# Patient Record
Sex: Male | Born: 1935 | Race: White | Hispanic: No | Marital: Married | Smoking: Former smoker
Health system: Southern US, Community
[De-identification: ages and names within clinical notes are randomized; demographics above are authoritative.]

## PROBLEM LIST (undated history)

## (undated) DIAGNOSIS — N401 Enlarged prostate with lower urinary tract symptoms: Secondary | ICD-10-CM

## (undated) DIAGNOSIS — I2699 Other pulmonary embolism without acute cor pulmonale: Secondary | ICD-10-CM

## (undated) DIAGNOSIS — K5900 Constipation, unspecified: Secondary | ICD-10-CM

## (undated) DIAGNOSIS — Z7901 Long term (current) use of anticoagulants: Secondary | ICD-10-CM

## (undated) DIAGNOSIS — Z8719 Personal history of other diseases of the digestive system: Secondary | ICD-10-CM

## (undated) DIAGNOSIS — D75829 Heparin-induced thrombocytopenia, unspecified: Secondary | ICD-10-CM

## (undated) DIAGNOSIS — K5730 Diverticulosis of large intestine without perforation or abscess without bleeding: Secondary | ICD-10-CM

## (undated) DIAGNOSIS — D709 Neutropenia, unspecified: Secondary | ICD-10-CM

## (undated) DIAGNOSIS — E785 Hyperlipidemia, unspecified: Secondary | ICD-10-CM

## (undated) DIAGNOSIS — A048 Other specified bacterial intestinal infections: Secondary | ICD-10-CM

## (undated) DIAGNOSIS — I4891 Unspecified atrial fibrillation: Secondary | ICD-10-CM

## (undated) DIAGNOSIS — R740 Nonspecific elevation of levels of transaminase and lactic acid dehydrogenase [LDH]: Secondary | ICD-10-CM

## (undated) DIAGNOSIS — I10 Essential (primary) hypertension: Secondary | ICD-10-CM

## (undated) DIAGNOSIS — J301 Allergic rhinitis due to pollen: Secondary | ICD-10-CM

## (undated) DIAGNOSIS — I00 Rheumatic fever without heart involvement: Secondary | ICD-10-CM

## (undated) DIAGNOSIS — I209 Angina pectoris, unspecified: Secondary | ICD-10-CM

## (undated) DIAGNOSIS — N138 Other obstructive and reflux uropathy: Secondary | ICD-10-CM

## (undated) DIAGNOSIS — C4401 Basal cell carcinoma of skin of lip: Secondary | ICD-10-CM

## (undated) DIAGNOSIS — J189 Pneumonia, unspecified organism: Secondary | ICD-10-CM

## (undated) DIAGNOSIS — D7582 Heparin induced thrombocytopenia (HIT): Secondary | ICD-10-CM

## (undated) DIAGNOSIS — D689 Coagulation defect, unspecified: Secondary | ICD-10-CM

## (undated) DIAGNOSIS — R17 Unspecified jaundice: Secondary | ICD-10-CM

## (undated) DIAGNOSIS — R609 Edema, unspecified: Secondary | ICD-10-CM

## (undated) DIAGNOSIS — R39198 Other difficulties with micturition: Secondary | ICD-10-CM

## (undated) DIAGNOSIS — K648 Other hemorrhoids: Secondary | ICD-10-CM

## (undated) DIAGNOSIS — M6208 Separation of muscle (nontraumatic), other site: Secondary | ICD-10-CM

## (undated) DIAGNOSIS — I219 Acute myocardial infarction, unspecified: Secondary | ICD-10-CM

## (undated) DIAGNOSIS — R7402 Elevation of levels of lactic acid dehydrogenase (LDH): Secondary | ICD-10-CM

## (undated) DIAGNOSIS — C4490 Unspecified malignant neoplasm of skin, unspecified: Secondary | ICD-10-CM

## (undated) DIAGNOSIS — F419 Anxiety disorder, unspecified: Secondary | ICD-10-CM

## (undated) DIAGNOSIS — G4730 Sleep apnea, unspecified: Secondary | ICD-10-CM

## (undated) DIAGNOSIS — R498 Other voice and resonance disorders: Secondary | ICD-10-CM

## (undated) DIAGNOSIS — I2510 Atherosclerotic heart disease of native coronary artery without angina pectoris: Secondary | ICD-10-CM

## (undated) DIAGNOSIS — N529 Male erectile dysfunction, unspecified: Secondary | ICD-10-CM

## (undated) DIAGNOSIS — R7401 Elevation of levels of liver transaminase levels: Secondary | ICD-10-CM

## (undated) DIAGNOSIS — R300 Dysuria: Secondary | ICD-10-CM

## (undated) HISTORY — DX: Constipation, unspecified: K59.00

## (undated) HISTORY — DX: Sleep apnea, unspecified: G47.30

## (undated) HISTORY — DX: Heparin induced thrombocytopenia (HIT): D75.82

## (undated) HISTORY — DX: Unspecified malignant neoplasm of skin, unspecified: C44.90

## (undated) HISTORY — DX: Other specified bacterial intestinal infections: A04.8

## (undated) HISTORY — DX: Separation of muscle (nontraumatic), other site: M62.08

## (undated) HISTORY — DX: Other difficulties with micturition: R39.198

## (undated) HISTORY — DX: Other obstructive and reflux uropathy: N13.8

## (undated) HISTORY — PX: HYDROCELE EXCISION: SHX482

## (undated) HISTORY — DX: Nonspecific elevation of levels of transaminase and lactic acid dehydrogenase (ldh): R74.0

## (undated) HISTORY — DX: Benign prostatic hyperplasia with lower urinary tract symptoms: N40.1

## (undated) HISTORY — DX: Acute myocardial infarction, unspecified: I21.9

## (undated) HISTORY — DX: Anxiety disorder, unspecified: F41.9

## (undated) HISTORY — DX: Rheumatic fever without heart involvement: I00

## (undated) HISTORY — DX: Diverticulosis of large intestine without perforation or abscess without bleeding: K57.30

## (undated) HISTORY — DX: Dysuria: R30.0

## (undated) HISTORY — DX: Male erectile dysfunction, unspecified: N52.9

## (undated) HISTORY — DX: Allergic rhinitis due to pollen: J30.1

## (undated) HISTORY — DX: Hyperlipidemia, unspecified: E78.5

## (undated) HISTORY — DX: Pneumonia, unspecified organism: J18.9

## (undated) HISTORY — DX: Personal history of other diseases of the digestive system: Z87.19

## (undated) HISTORY — DX: Coagulation defect, unspecified: D68.9

## (undated) HISTORY — DX: Other pulmonary embolism without acute cor pulmonale: I26.99

## (undated) HISTORY — DX: Unspecified atrial fibrillation: I48.91

## (undated) HISTORY — DX: Elevation of levels of lactic acid dehydrogenase (LDH): R74.02

## (undated) HISTORY — DX: Essential (primary) hypertension: I10

## (undated) HISTORY — DX: Other voice and resonance disorders: R49.8

## (undated) HISTORY — DX: Long term (current) use of anticoagulants: Z79.01

## (undated) HISTORY — DX: Elevation of levels of liver transaminase levels: R74.01

## (undated) HISTORY — DX: Basal cell carcinoma of skin of lip: C44.01

## (undated) HISTORY — DX: Heparin-induced thrombocytopenia, unspecified: D75.829

## (undated) HISTORY — PX: SCROTUM EXPLORATION: SHX2389

## (undated) HISTORY — PX: CARDIAC CATHETERIZATION: SHX172

## (undated) HISTORY — DX: Other hemorrhoids: K64.8

## (undated) HISTORY — DX: Edema, unspecified: R60.9

## (undated) HISTORY — DX: Atherosclerotic heart disease of native coronary artery without angina pectoris: I25.10

## (undated) HISTORY — DX: Neutropenia, unspecified: D70.9

## (undated) HISTORY — PX: MOHS SURGERY: SHX181

---

## 1992-04-07 HISTORY — PX: VASECTOMY: SHX75

## 1995-12-09 HISTORY — PX: OTHER SURGICAL HISTORY: SHX169

## 1999-02-06 HISTORY — PX: SPERMATOCELECTOMY: SHX2420

## 1999-02-26 ENCOUNTER — Ambulatory Visit (HOSPITAL_COMMUNITY): Admission: RE | Admit: 1999-02-26 | Discharge: 1999-02-26 | Payer: Self-pay | Admitting: Urology

## 1999-12-26 ENCOUNTER — Encounter (INDEPENDENT_AMBULATORY_CARE_PROVIDER_SITE_OTHER): Payer: Self-pay | Admitting: Specialist

## 1999-12-26 ENCOUNTER — Ambulatory Visit (HOSPITAL_COMMUNITY): Admission: RE | Admit: 1999-12-26 | Discharge: 1999-12-26 | Payer: Self-pay | Admitting: Urology

## 2000-02-01 ENCOUNTER — Emergency Department (HOSPITAL_COMMUNITY): Admission: EM | Admit: 2000-02-01 | Discharge: 2000-02-01 | Payer: Self-pay | Admitting: Emergency Medicine

## 2001-03-20 ENCOUNTER — Encounter: Payer: Self-pay | Admitting: Emergency Medicine

## 2001-03-20 ENCOUNTER — Emergency Department (HOSPITAL_COMMUNITY): Admission: EM | Admit: 2001-03-20 | Discharge: 2001-03-20 | Payer: Self-pay | Admitting: *Deleted

## 2001-09-29 ENCOUNTER — Other Ambulatory Visit: Admission: RE | Admit: 2001-09-29 | Discharge: 2001-09-29 | Payer: Self-pay | Admitting: Urology

## 2001-09-29 ENCOUNTER — Encounter (INDEPENDENT_AMBULATORY_CARE_PROVIDER_SITE_OTHER): Payer: Self-pay

## 2008-09-28 ENCOUNTER — Telehealth: Payer: Self-pay | Admitting: Internal Medicine

## 2008-11-06 ENCOUNTER — Ambulatory Visit (HOSPITAL_COMMUNITY): Admission: RE | Admit: 2008-11-06 | Discharge: 2008-11-07 | Payer: Self-pay | Admitting: General Surgery

## 2008-12-08 HISTORY — PX: HERNIA REPAIR: SHX51

## 2008-12-17 ENCOUNTER — Emergency Department (HOSPITAL_COMMUNITY): Admission: EM | Admit: 2008-12-17 | Discharge: 2008-12-18 | Payer: Self-pay | Admitting: Emergency Medicine

## 2010-09-20 ENCOUNTER — Encounter: Admission: RE | Admit: 2010-09-20 | Discharge: 2010-09-20 | Payer: Self-pay | Admitting: Orthopedic Surgery

## 2010-09-21 ENCOUNTER — Encounter: Admission: RE | Admit: 2010-09-21 | Discharge: 2010-09-21 | Payer: Self-pay | Admitting: Orthopedic Surgery

## 2010-12-08 DIAGNOSIS — I2699 Other pulmonary embolism without acute cor pulmonale: Secondary | ICD-10-CM

## 2010-12-08 DIAGNOSIS — I219 Acute myocardial infarction, unspecified: Secondary | ICD-10-CM

## 2010-12-08 HISTORY — PX: CORONARY ARTERY BYPASS GRAFT: SHX141

## 2010-12-08 HISTORY — DX: Acute myocardial infarction, unspecified: I21.9

## 2010-12-08 HISTORY — DX: Other pulmonary embolism without acute cor pulmonale: I26.99

## 2011-01-16 ENCOUNTER — Emergency Department (HOSPITAL_COMMUNITY)
Admission: EM | Admit: 2011-01-16 | Discharge: 2011-01-17 | Disposition: A | Discharge status: Other Acute Inpatient Hospital | Payer: BC Managed Care – PPO | Source: Home / Self Care | Attending: Emergency Medicine | Admitting: Emergency Medicine

## 2011-01-16 ENCOUNTER — Emergency Department (HOSPITAL_COMMUNITY): Payer: BC Managed Care – PPO

## 2011-01-16 DIAGNOSIS — I219 Acute myocardial infarction, unspecified: Secondary | ICD-10-CM | POA: Insufficient documentation

## 2011-01-16 DIAGNOSIS — R079 Chest pain, unspecified: Secondary | ICD-10-CM | POA: Insufficient documentation

## 2011-01-16 LAB — URINALYSIS, ROUTINE W REFLEX MICROSCOPIC
Hgb urine dipstick: NEGATIVE
Ketones, ur: NEGATIVE mg/dL
Protein, ur: NEGATIVE mg/dL
Urine Glucose, Fasting: NEGATIVE mg/dL

## 2011-01-16 LAB — CBC
Hemoglobin: 15.7 g/dL (ref 13.0–17.0)
MCH: 31.4 pg (ref 26.0–34.0)
MCHC: 35.3 g/dL (ref 30.0–36.0)
MCV: 89 fL (ref 78.0–100.0)
Platelets: 286 10*3/uL (ref 150–400)
RBC: 5 MIL/uL (ref 4.22–5.81)

## 2011-01-16 LAB — TROPONIN I
Troponin I: 0.04 ng/mL (ref 0.00–0.06)
Troponin I: 0.11 ng/mL — ABNORMAL HIGH (ref 0.00–0.06)

## 2011-01-16 LAB — DIFFERENTIAL
Eosinophils Absolute: 0.1 10*3/uL (ref 0.0–0.7)
Lymphs Abs: 2.1 10*3/uL (ref 0.7–4.0)
Monocytes Absolute: 0.4 10*3/uL (ref 0.1–1.0)
Monocytes Relative: 6 % (ref 3–12)
Neutrophils Relative %: 58 % (ref 43–77)

## 2011-01-16 LAB — CK TOTAL AND CKMB (NOT AT ARMC)
CK, MB: 4.9 ng/mL — ABNORMAL HIGH (ref 0.3–4.0)
Relative Index: INVALID (ref 0.0–2.5)
Total CK: 93 U/L (ref 7–232)

## 2011-01-16 LAB — BASIC METABOLIC PANEL
BUN: 16 mg/dL (ref 6–23)
CO2: 26 mEq/L (ref 19–32)
Chloride: 104 mEq/L (ref 96–112)
Creatinine, Ser: 1.15 mg/dL (ref 0.4–1.5)

## 2011-01-17 ENCOUNTER — Inpatient Hospital Stay (HOSPITAL_COMMUNITY)
Admission: AD | Admit: 2011-01-17 | Discharge: 2011-02-14 | DRG: 546 | Disposition: A | Discharge status: Home Health | Payer: BC Managed Care – PPO | Source: Other Acute Inpatient Hospital | Attending: Thoracic Surgery (Cardiothoracic Vascular Surgery) | Admitting: Thoracic Surgery (Cardiothoracic Vascular Surgery)

## 2011-01-17 DIAGNOSIS — D72829 Elevated white blood cell count, unspecified: Secondary | ICD-10-CM | POA: Diagnosis present

## 2011-01-17 DIAGNOSIS — I2510 Atherosclerotic heart disease of native coronary artery without angina pectoris: Secondary | ICD-10-CM

## 2011-01-17 DIAGNOSIS — I214 Non-ST elevation (NSTEMI) myocardial infarction: Principal | ICD-10-CM | POA: Diagnosis present

## 2011-01-17 DIAGNOSIS — J9600 Acute respiratory failure, unspecified whether with hypoxia or hypercapnia: Secondary | ICD-10-CM | POA: Diagnosis not present

## 2011-01-17 DIAGNOSIS — J189 Pneumonia, unspecified organism: Secondary | ICD-10-CM | POA: Diagnosis not present

## 2011-01-17 DIAGNOSIS — K5900 Constipation, unspecified: Secondary | ICD-10-CM | POA: Diagnosis not present

## 2011-01-17 DIAGNOSIS — G4733 Obstructive sleep apnea (adult) (pediatric): Secondary | ICD-10-CM | POA: Diagnosis present

## 2011-01-17 DIAGNOSIS — E785 Hyperlipidemia, unspecified: Secondary | ICD-10-CM | POA: Diagnosis present

## 2011-01-17 DIAGNOSIS — I4891 Unspecified atrial fibrillation: Secondary | ICD-10-CM | POA: Diagnosis present

## 2011-01-17 DIAGNOSIS — D62 Acute posthemorrhagic anemia: Secondary | ICD-10-CM | POA: Diagnosis not present

## 2011-01-17 DIAGNOSIS — D696 Thrombocytopenia, unspecified: Secondary | ICD-10-CM | POA: Diagnosis not present

## 2011-01-17 DIAGNOSIS — E871 Hypo-osmolality and hyponatremia: Secondary | ICD-10-CM | POA: Diagnosis present

## 2011-01-17 DIAGNOSIS — F411 Generalized anxiety disorder: Secondary | ICD-10-CM | POA: Diagnosis present

## 2011-01-17 DIAGNOSIS — J9819 Other pulmonary collapse: Secondary | ICD-10-CM | POA: Diagnosis not present

## 2011-01-17 DIAGNOSIS — Z01810 Encounter for preprocedural cardiovascular examination: Secondary | ICD-10-CM

## 2011-01-17 DIAGNOSIS — I2699 Other pulmonary embolism without acute cor pulmonale: Secondary | ICD-10-CM | POA: Diagnosis not present

## 2011-01-17 LAB — CBC
HCT: 43.5 % (ref 39.0–52.0)
Hemoglobin: 14.9 g/dL (ref 13.0–17.0)
MCH: 31 pg (ref 26.0–34.0)
MCHC: 34.3 g/dL (ref 30.0–36.0)
RDW: 12.9 % (ref 11.5–15.5)

## 2011-01-17 LAB — COMPREHENSIVE METABOLIC PANEL
ALT: 19 U/L (ref 0–53)
AST: 25 U/L (ref 0–37)
Albumin: 3.6 g/dL (ref 3.5–5.2)
CO2: 29 mEq/L (ref 19–32)
Calcium: 8.9 mg/dL (ref 8.4–10.5)
Chloride: 105 mEq/L (ref 96–112)
Creatinine, Ser: 1.07 mg/dL (ref 0.4–1.5)
GFR calc Af Amer: >60 mL/min (ref >60)
GFR calc non Af Amer: >60 mL/min (ref >60)
Sodium: 141 mEq/L (ref 135–145)
Total Bilirubin: 1.3 mg/dL — ABNORMAL HIGH (ref 0.3–1.2)

## 2011-01-17 LAB — HEMOGLOBIN A1C
Hgb A1c MFr Bld: 6 % — ABNORMAL HIGH (ref <5.7)
Mean Plasma Glucose: 126 mg/dL — ABNORMAL HIGH (ref <117)

## 2011-01-17 LAB — PROTIME-INR
INR: 1.09 (ref 0.00–1.49)
Prothrombin Time: 14.3 seconds (ref 11.6–15.2)

## 2011-01-17 LAB — CARDIAC PANEL(CRET KIN+CKTOT+MB+TROPI)
CK, MB: 4 ng/mL (ref 0.3–4.0)
CK, MB: 5.4 ng/mL — ABNORMAL HIGH (ref 0.3–4.0)
Total CK: 91 U/L (ref 7–232)
Troponin I: 0.12 ng/mL — ABNORMAL HIGH (ref 0.00–0.06)

## 2011-01-17 LAB — TSH: TSH: 3.301 u[IU]/mL (ref 0.350–4.500)

## 2011-01-17 LAB — LIPID PANEL
HDL: 49 mg/dL (ref >39)
Total CHOL/HDL Ratio: 4.7 RATIO

## 2011-01-18 DIAGNOSIS — I2510 Atherosclerotic heart disease of native coronary artery without angina pectoris: Secondary | ICD-10-CM

## 2011-01-18 LAB — BASIC METABOLIC PANEL
BUN: 12 mg/dL (ref 6–23)
CO2: 28 mEq/L (ref 19–32)
GFR calc non Af Amer: >60 mL/min (ref >60)
Glucose, Bld: 109 mg/dL — ABNORMAL HIGH (ref 70–99)
Potassium: 4 mEq/L (ref 3.5–5.1)

## 2011-01-18 LAB — CBC
HCT: 41.5 % (ref 39.0–52.0)
HCT: 41.8 % (ref 39.0–52.0)
Hemoglobin: 14.7 g/dL (ref 13.0–17.0)
MCH: 30.5 pg (ref 26.0–34.0)
MCH: 31.6 pg (ref 26.0–34.0)
MCHC: 35.2 g/dL (ref 30.0–36.0)
MCV: 89.8 fL (ref 78.0–100.0)
MCV: 89.9 fL (ref 78.0–100.0)
Platelets: 244 10*3/uL (ref 150–400)
RBC: 4.62 MIL/uL (ref 4.22–5.81)
RDW: 12.9 % (ref 11.5–15.5)
RDW: 12.9 % (ref 11.5–15.5)

## 2011-01-19 DIAGNOSIS — I4891 Unspecified atrial fibrillation: Secondary | ICD-10-CM

## 2011-01-19 LAB — CBC
HCT: 43.6 % (ref 39.0–52.0)
Hemoglobin: 15.1 g/dL (ref 13.0–17.0)
MCH: 31.2 pg (ref 26.0–34.0)
MCHC: 34.6 g/dL (ref 30.0–36.0)
MCV: 90.1 fL (ref 78.0–100.0)
RBC: 4.84 MIL/uL (ref 4.22–5.81)

## 2011-01-19 LAB — TYPE AND SCREEN: ABO/RH(D): A POS

## 2011-01-19 LAB — HEPARIN LEVEL (UNFRACTIONATED): Heparin Unfractionated: 0.44 IU/mL (ref 0.30–0.70)

## 2011-01-19 LAB — ABO/RH: ABO/RH(D): A POS

## 2011-01-20 ENCOUNTER — Inpatient Hospital Stay (HOSPITAL_COMMUNITY): Payer: BC Managed Care – PPO

## 2011-01-20 DIAGNOSIS — I2510 Atherosclerotic heart disease of native coronary artery without angina pectoris: Secondary | ICD-10-CM

## 2011-01-20 DIAGNOSIS — I4891 Unspecified atrial fibrillation: Secondary | ICD-10-CM

## 2011-01-20 LAB — POCT I-STAT, CHEM 8
Calcium, Ion: 1.24 mmol/L (ref 1.12–1.32)
Chloride: 106 mEq/L (ref 96–112)
Glucose, Bld: 135 mg/dL — ABNORMAL HIGH (ref 70–99)
HCT: 27 % — ABNORMAL LOW (ref 39.0–52.0)
Hemoglobin: 9.2 g/dL — ABNORMAL LOW (ref 13.0–17.0)
Potassium: 4.6 mEq/L (ref 3.5–5.1)

## 2011-01-20 LAB — BLOOD GAS, ARTERIAL
Acid-Base Excess: 4.2 mmol/L — ABNORMAL HIGH (ref 0.0–2.0)
Bicarbonate: 28.5 mEq/L — ABNORMAL HIGH (ref 20.0–24.0)
FIO2: 0.21 %
O2 Saturation: 91.8 %
Patient temperature: 97.4
TCO2: 29.8 mmol/L (ref 0–100)
pO2, Arterial: 63 mmHg — ABNORMAL LOW (ref 80.0–100.0)

## 2011-01-20 LAB — CBC
HCT: 28.2 % — ABNORMAL LOW (ref 39.0–52.0)
Hemoglobin: 9.7 g/dL — ABNORMAL LOW (ref 13.0–17.0)
Hemoglobin: 9.7 g/dL — ABNORMAL LOW (ref 13.0–17.0)
MCH: 31.3 pg (ref 26.0–34.0)
MCHC: 35 g/dL (ref 30.0–36.0)
MCHC: 34.4 g/dL (ref 30.0–36.0)
Platelets: 105 10*3/uL — ABNORMAL LOW (ref 150–400)
RDW: 13 % (ref 11.5–15.5)
RDW: 13.2 % (ref 11.5–15.5)
WBC: 11.2 10*3/uL — ABNORMAL HIGH (ref 4.0–10.5)

## 2011-01-20 LAB — POCT I-STAT 4, (NA,K, GLUC, HGB,HCT)
Glucose, Bld: 116 mg/dL — ABNORMAL HIGH (ref 70–99)
Glucose, Bld: 82 mg/dL (ref 70–99)
HCT: 42 % (ref 39.0–52.0)
HCT: 31 % — ABNORMAL LOW (ref 39.0–52.0)
HCT: 31 % — ABNORMAL LOW (ref 39.0–52.0)
HCT: 32 % — ABNORMAL LOW (ref 39.0–52.0)
Hemoglobin: 13.3 g/dL (ref 13.0–17.0)
Hemoglobin: 10.5 g/dL — ABNORMAL LOW (ref 13.0–17.0)
Hemoglobin: 10.9 g/dL — ABNORMAL LOW (ref 13.0–17.0)
Potassium: 3.8 mEq/L (ref 3.5–5.1)
Potassium: 4 mEq/L (ref 3.5–5.1)
Potassium: 5.3 mEq/L — ABNORMAL HIGH (ref 3.5–5.1)
Sodium: 139 mEq/L (ref 135–145)
Sodium: 135 mEq/L (ref 135–145)
Sodium: 132 mEq/L — ABNORMAL LOW (ref 135–145)
Sodium: 137 mEq/L (ref 135–145)

## 2011-01-20 LAB — POCT I-STAT 3, ART BLOOD GAS (G3+)
Acid-Base Excess: 4 mmol/L — ABNORMAL HIGH (ref 0.0–2.0)
Acid-Base Excess: 1 mmol/L (ref 0.0–2.0)
Acid-base deficit: 1 mmol/L (ref 0.0–2.0)
Bicarbonate: 29.7 mEq/L — ABNORMAL HIGH (ref 20.0–24.0)
Bicarbonate: 25.4 mEq/L — ABNORMAL HIGH (ref 20.0–24.0)
Bicarbonate: 25.1 mEq/L — ABNORMAL HIGH (ref 20.0–24.0)
Bicarbonate: 24.4 mEq/L — ABNORMAL HIGH (ref 20.0–24.0)
O2 Saturation: 100 %
O2 Saturation: 100 %
O2 Saturation: 100 %
O2 Saturation: 96 %
Patient temperature: 35.6
TCO2: 26 mmol/L (ref 0–100)
TCO2: 26 mmol/L (ref 0–100)
pCO2 arterial: 48.2 mmHg — ABNORMAL HIGH (ref 35.0–45.0)
pCO2 arterial: 39.8 mmHg (ref 35.0–45.0)
pCO2 arterial: 42 mmHg (ref 35.0–45.0)
pO2, Arterial: 438 mmHg — ABNORMAL HIGH (ref 80.0–100.0)
pO2, Arterial: 347 mmHg — ABNORMAL HIGH (ref 80.0–100.0)
pO2, Arterial: 175 mmHg — ABNORMAL HIGH (ref 80.0–100.0)
pO2, Arterial: 79 mmHg — ABNORMAL LOW (ref 80.0–100.0)
pO2, Arterial: 83 mmHg (ref 80.0–100.0)

## 2011-01-20 LAB — PLATELET COUNT: Platelets: 216 10*3/uL (ref 150–400)

## 2011-01-20 LAB — CREATININE, SERUM
Creatinine, Ser: 1 mg/dL (ref 0.4–1.5)
GFR calc Af Amer: >60 mL/min (ref >60)
GFR calc non Af Amer: >60 mL/min (ref >60)

## 2011-01-20 LAB — APTT: aPTT: 41 seconds — ABNORMAL HIGH (ref 24–37)

## 2011-01-20 LAB — HEMOGLOBIN AND HEMATOCRIT, BLOOD: Hemoglobin: 11 g/dL — ABNORMAL LOW (ref 13.0–17.0)

## 2011-01-21 ENCOUNTER — Inpatient Hospital Stay (HOSPITAL_COMMUNITY): Payer: BC Managed Care – PPO

## 2011-01-21 LAB — GLUCOSE, CAPILLARY
Glucose-Capillary: 98 mg/dL (ref 70–99)
Glucose-Capillary: 114 mg/dL — ABNORMAL HIGH (ref 70–99)
Glucose-Capillary: 141 mg/dL — ABNORMAL HIGH (ref 70–99)
Glucose-Capillary: 131 mg/dL — ABNORMAL HIGH (ref 70–99)
Glucose-Capillary: 136 mg/dL — ABNORMAL HIGH (ref 70–99)
Glucose-Capillary: 140 mg/dL — ABNORMAL HIGH (ref 70–99)

## 2011-01-21 LAB — POCT I-STAT, CHEM 8
BUN: 20 mg/dL (ref 6–23)
Calcium, Ion: 1.18 mmol/L (ref 1.12–1.32)
Chloride: 97 mEq/L (ref 96–112)
Creatinine, Ser: 1.1 mg/dL (ref 0.4–1.5)
Glucose, Bld: 144 mg/dL — ABNORMAL HIGH (ref 70–99)
HCT: 26 % — ABNORMAL LOW (ref 39.0–52.0)
Potassium: 4.1 mEq/L (ref 3.5–5.1)

## 2011-01-21 LAB — CBC
HCT: 26.8 % — ABNORMAL LOW (ref 39.0–52.0)
Hemoglobin: 9.1 g/dL — ABNORMAL LOW (ref 13.0–17.0)
MCH: 31 pg (ref 26.0–34.0)
MCHC: 34 g/dL (ref 30.0–36.0)
MCV: 89.9 fL (ref 78.0–100.0)
MCV: 91.2 fL (ref 78.0–100.0)
Platelets: 151 10*3/uL (ref 150–400)
RBC: 2.87 MIL/uL — ABNORMAL LOW (ref 4.22–5.81)
RDW: 13.5 % (ref 11.5–15.5)
RDW: 13.6 % (ref 11.5–15.5)

## 2011-01-21 LAB — MAGNESIUM: Magnesium: 2 mg/dL (ref 1.5–2.5)

## 2011-01-21 LAB — BASIC METABOLIC PANEL
BUN: 14 mg/dL (ref 6–23)
Calcium: 8.2 mg/dL — ABNORMAL LOW (ref 8.4–10.5)
Chloride: 106 mEq/L (ref 96–112)
Creatinine, Ser: 1.18 mg/dL (ref 0.4–1.5)
GFR calc Af Amer: >60 mL/min (ref >60)
GFR calc non Af Amer: >60 mL/min (ref >60)

## 2011-01-21 LAB — CREATININE, SERUM: Creatinine, Ser: 1.12 mg/dL (ref 0.4–1.5)

## 2011-01-22 ENCOUNTER — Inpatient Hospital Stay (HOSPITAL_COMMUNITY): Payer: BC Managed Care – PPO

## 2011-01-22 LAB — BASIC METABOLIC PANEL
CO2: 31 mEq/L (ref 19–32)
Chloride: 96 mEq/L (ref 96–112)
GFR calc Af Amer: >60 mL/min (ref >60)
Potassium: 4.1 mEq/L (ref 3.5–5.1)

## 2011-01-22 LAB — GLUCOSE, CAPILLARY
Glucose-Capillary: 126 mg/dL — ABNORMAL HIGH (ref 70–99)
Glucose-Capillary: 147 mg/dL — ABNORMAL HIGH (ref 70–99)
Glucose-Capillary: 150 mg/dL — ABNORMAL HIGH (ref 70–99)

## 2011-01-22 LAB — CBC
Hemoglobin: 8.6 g/dL — ABNORMAL LOW (ref 13.0–17.0)
MCH: 30.8 pg (ref 26.0–34.0)
Platelets: 151 10*3/uL (ref 150–400)
RBC: 2.79 MIL/uL — ABNORMAL LOW (ref 4.22–5.81)
WBC: 13.6 10*3/uL — ABNORMAL HIGH (ref 4.0–10.5)

## 2011-01-23 ENCOUNTER — Inpatient Hospital Stay (HOSPITAL_COMMUNITY): Payer: BC Managed Care – PPO

## 2011-01-23 LAB — BASIC METABOLIC PANEL
CO2: 30 mEq/L (ref 19–32)
Calcium: 8.3 mg/dL — ABNORMAL LOW (ref 8.4–10.5)
Chloride: 97 mEq/L (ref 96–112)
GFR calc Af Amer: >60 mL/min (ref >60)
Glucose, Bld: 125 mg/dL — ABNORMAL HIGH (ref 70–99)
Potassium: 4.6 mEq/L (ref 3.5–5.1)
Sodium: 131 mEq/L — ABNORMAL LOW (ref 135–145)

## 2011-01-23 LAB — CBC
HCT: 27.2 % — ABNORMAL LOW (ref 39.0–52.0)
Hemoglobin: 9 g/dL — ABNORMAL LOW (ref 13.0–17.0)
RBC: 2.98 MIL/uL — ABNORMAL LOW (ref 4.22–5.81)
WBC: 13.7 10*3/uL — ABNORMAL HIGH (ref 4.0–10.5)

## 2011-01-25 ENCOUNTER — Inpatient Hospital Stay (HOSPITAL_COMMUNITY): Payer: BC Managed Care – PPO

## 2011-01-25 LAB — CBC
HCT: 25.2 % — ABNORMAL LOW (ref 39.0–52.0)
Hemoglobin: 8.5 g/dL — ABNORMAL LOW (ref 13.0–17.0)
MCH: 30.6 pg (ref 26.0–34.0)
MCV: 90.6 fL (ref 78.0–100.0)
RBC: 2.78 MIL/uL — ABNORMAL LOW (ref 4.22–5.81)

## 2011-01-25 LAB — BRAIN NATRIURETIC PEPTIDE: Pro B Natriuretic peptide (BNP): 362 pg/mL — ABNORMAL HIGH (ref 0.0–100.0)

## 2011-01-26 LAB — DIFFERENTIAL
Basophils Absolute: 0.1 10*3/uL (ref 0.0–0.1)
Eosinophils Relative: 3 % (ref 0–5)
Lymphocytes Relative: 12 % (ref 12–46)
Monocytes Relative: 12 % (ref 3–12)
Neutro Abs: 8.4 10*3/uL — ABNORMAL HIGH (ref 1.7–7.7)

## 2011-01-26 LAB — CBC
HCT: 25 % — ABNORMAL LOW (ref 39.0–52.0)
Hemoglobin: 8.4 g/dL — ABNORMAL LOW (ref 13.0–17.0)
MCH: 30.8 pg (ref 26.0–34.0)
RBC: 2.73 MIL/uL — ABNORMAL LOW (ref 4.22–5.81)

## 2011-01-26 LAB — COMPREHENSIVE METABOLIC PANEL
ALT: 67 U/L — ABNORMAL HIGH (ref 0–53)
AST: 42 U/L — ABNORMAL HIGH (ref 0–37)
CO2: 31 mEq/L (ref 19–32)
Chloride: 91 mEq/L — ABNORMAL LOW (ref 96–112)
GFR calc Af Amer: >60 mL/min (ref >60)
GFR calc non Af Amer: >60 mL/min (ref >60)
Sodium: 133 mEq/L — ABNORMAL LOW (ref 135–145)
Total Bilirubin: 2.3 mg/dL — ABNORMAL HIGH (ref 0.3–1.2)

## 2011-01-27 LAB — COMPREHENSIVE METABOLIC PANEL
BUN: 21 mg/dL (ref 6–23)
CO2: 28 mEq/L (ref 19–32)
Calcium: 7.7 mg/dL — ABNORMAL LOW (ref 8.4–10.5)
Creatinine, Ser: 1.13 mg/dL (ref 0.4–1.5)
GFR calc non Af Amer: >60 mL/min (ref >60)
Glucose, Bld: 133 mg/dL — ABNORMAL HIGH (ref 70–99)

## 2011-01-28 ENCOUNTER — Inpatient Hospital Stay (HOSPITAL_COMMUNITY): Payer: BC Managed Care – PPO

## 2011-01-29 ENCOUNTER — Encounter (HOSPITAL_COMMUNITY): Payer: Self-pay | Admitting: Radiology

## 2011-01-29 ENCOUNTER — Inpatient Hospital Stay (HOSPITAL_COMMUNITY): Payer: BC Managed Care – PPO

## 2011-01-29 DIAGNOSIS — J189 Pneumonia, unspecified organism: Secondary | ICD-10-CM

## 2011-01-29 DIAGNOSIS — R0902 Hypoxemia: Secondary | ICD-10-CM

## 2011-01-29 DIAGNOSIS — J90 Pleural effusion, not elsewhere classified: Secondary | ICD-10-CM

## 2011-01-29 DIAGNOSIS — Z951 Presence of aortocoronary bypass graft: Secondary | ICD-10-CM

## 2011-01-29 LAB — BASIC METABOLIC PANEL
CO2: 27 mEq/L (ref 19–32)
GFR calc non Af Amer: >60 mL/min (ref >60)
Glucose, Bld: 160 mg/dL — ABNORMAL HIGH (ref 70–99)
Potassium: 4.4 mEq/L (ref 3.5–5.1)
Sodium: 124 mEq/L — ABNORMAL LOW (ref 135–145)

## 2011-01-29 LAB — URINALYSIS, ROUTINE W REFLEX MICROSCOPIC
Specific Gravity, Urine: 1.027 (ref 1.005–1.030)
Urine Glucose, Fasting: NEGATIVE mg/dL
pH: 6 (ref 5.0–8.0)

## 2011-01-29 LAB — GLUCOSE, CAPILLARY: Glucose-Capillary: 139 mg/dL — ABNORMAL HIGH (ref 70–99)

## 2011-01-29 LAB — CBC
HCT: 26.4 % — ABNORMAL LOW (ref 39.0–52.0)
Hemoglobin: 8.8 g/dL — ABNORMAL LOW (ref 13.0–17.0)
RDW: 15 % (ref 11.5–15.5)
WBC: 20.3 10*3/uL — ABNORMAL HIGH (ref 4.0–10.5)

## 2011-01-29 LAB — SODIUM, URINE, RANDOM: Sodium, Ur: 16 mEq/L

## 2011-01-29 MED ORDER — IOHEXOL 300 MG/ML  SOLN: 80.0000 mL | Freq: Once | INTRAMUSCULAR | Status: AC | PRN

## 2011-01-30 ENCOUNTER — Inpatient Hospital Stay (HOSPITAL_COMMUNITY): Payer: BC Managed Care – PPO

## 2011-01-30 DIAGNOSIS — J9600 Acute respiratory failure, unspecified whether with hypoxia or hypercapnia: Secondary | ICD-10-CM

## 2011-01-30 DIAGNOSIS — I2699 Other pulmonary embolism without acute cor pulmonale: Secondary | ICD-10-CM

## 2011-01-30 LAB — BASIC METABOLIC PANEL
CO2: 28 mEq/L (ref 19–32)
Calcium: 7.7 mg/dL — ABNORMAL LOW (ref 8.4–10.5)
Chloride: 92 mEq/L — ABNORMAL LOW (ref 96–112)
Creatinine, Ser: 1.3 mg/dL (ref 0.4–1.5)
GFR calc Af Amer: >60 mL/min (ref >60)
GFR calc Af Amer: >60 mL/min (ref >60)
GFR calc non Af Amer: >60 mL/min (ref >60)
Potassium: 5.5 mEq/L — ABNORMAL HIGH (ref 3.5–5.1)
Sodium: 129 mEq/L — ABNORMAL LOW (ref 135–145)
Sodium: 127 mEq/L — ABNORMAL LOW (ref 135–145)

## 2011-01-30 LAB — PHOSPHORUS: Phosphorus: 3.1 mg/dL (ref 2.3–4.6)

## 2011-01-30 LAB — CBC
MCV: 93 fL (ref 78.0–100.0)
Platelets: 112 10*3/uL — ABNORMAL LOW (ref 150–400)
RBC: 2.84 MIL/uL — ABNORMAL LOW (ref 4.22–5.81)
WBC: 21.7 10*3/uL — ABNORMAL HIGH (ref 4.0–10.5)

## 2011-01-30 LAB — POCT I-STAT 3, ART BLOOD GAS (G3+)
Acid-Base Excess: 3 mmol/L — ABNORMAL HIGH (ref 0.0–2.0)
Patient temperature: 98.6

## 2011-01-30 LAB — OSMOLALITY: Osmolality: 262 mOsm/kg — ABNORMAL LOW (ref 275–300)

## 2011-01-30 LAB — HEPARIN LEVEL (UNFRACTIONATED): Heparin Unfractionated: 0.34 IU/mL (ref 0.30–0.70)

## 2011-01-30 LAB — SEDIMENTATION RATE: Sed Rate: 61 mm/hr — ABNORMAL HIGH (ref 0–16)

## 2011-01-30 LAB — PROTIME-INR: Prothrombin Time: 17.1 seconds — ABNORMAL HIGH (ref 11.6–15.2)

## 2011-01-31 ENCOUNTER — Inpatient Hospital Stay (HOSPITAL_COMMUNITY): Payer: BC Managed Care – PPO

## 2011-01-31 DIAGNOSIS — I2699 Other pulmonary embolism without acute cor pulmonale: Secondary | ICD-10-CM

## 2011-01-31 DIAGNOSIS — D7582 Heparin induced thrombocytopenia (HIT): Secondary | ICD-10-CM

## 2011-01-31 LAB — LACTIC ACID, PLASMA: Lactic Acid, Venous: 1.3 mmol/L (ref 0.5–2.2)

## 2011-01-31 LAB — COMPREHENSIVE METABOLIC PANEL
Alkaline Phosphatase: 82 U/L (ref 39–117)
BUN: 23 mg/dL (ref 6–23)
CO2: 28 mEq/L (ref 19–32)
Chloride: 93 mEq/L — ABNORMAL LOW (ref 96–112)
GFR calc non Af Amer: >60 mL/min (ref >60)
Glucose, Bld: 114 mg/dL — ABNORMAL HIGH (ref 70–99)
Potassium: 4 mEq/L (ref 3.5–5.1)
Total Bilirubin: 1.7 mg/dL — ABNORMAL HIGH (ref 0.3–1.2)
Total Protein: 5.7 g/dL — ABNORMAL LOW (ref 6.0–8.3)

## 2011-01-31 LAB — POCT I-STAT 3, ART BLOOD GAS (G3+)
Acid-Base Excess: 2 mmol/L (ref 0.0–2.0)
Acid-Base Excess: 3 mmol/L — ABNORMAL HIGH (ref 0.0–2.0)
Acid-Base Excess: 2 mmol/L (ref 0.0–2.0)
Bicarbonate: 28.4 mEq/L — ABNORMAL HIGH (ref 20.0–24.0)
Bicarbonate: 28 mEq/L — ABNORMAL HIGH (ref 20.0–24.0)
Bicarbonate: 27.8 mEq/L — ABNORMAL HIGH (ref 20.0–24.0)
pCO2 arterial: 42 mmHg (ref 35.0–45.0)
pCO2 arterial: 43.4 mmHg (ref 35.0–45.0)
pCO2 arterial: 44.6 mmHg (ref 35.0–45.0)
pCO2 arterial: 45.5 mmHg — ABNORMAL HIGH (ref 35.0–45.0)
pH, Arterial: 7.487 — ABNORMAL HIGH (ref 7.350–7.450)
pH, Arterial: 7.426 (ref 7.350–7.450)
pH, Arterial: 7.342 — ABNORMAL LOW (ref 7.350–7.450)
pH, Arterial: 7.406 (ref 7.350–7.450)
pH, Arterial: 7.393 (ref 7.350–7.450)
pO2, Arterial: 57 mmHg — ABNORMAL LOW (ref 80.0–100.0)
pO2, Arterial: 62 mmHg — ABNORMAL LOW (ref 80.0–100.0)
pO2, Arterial: 110 mmHg — ABNORMAL HIGH (ref 80.0–100.0)
pO2, Arterial: 141 mmHg — ABNORMAL HIGH (ref 80.0–100.0)

## 2011-01-31 LAB — BASIC METABOLIC PANEL
CO2: 27 mEq/L (ref 19–32)
Calcium: 7.5 mg/dL — ABNORMAL LOW (ref 8.4–10.5)
Chloride: 94 mEq/L — ABNORMAL LOW (ref 96–112)
GFR calc Af Amer: >60 mL/min (ref >60)
Glucose, Bld: 165 mg/dL — ABNORMAL HIGH (ref 70–99)
Sodium: 131 mEq/L — ABNORMAL LOW (ref 135–145)

## 2011-01-31 LAB — CBC
MCH: 31 pg (ref 26.0–34.0)
MCV: 92 fL (ref 78.0–100.0)
Platelets: 65 10*3/uL — ABNORMAL LOW (ref 150–400)
RDW: 15.4 % (ref 11.5–15.5)

## 2011-01-31 LAB — PROCALCITONIN: Procalcitonin: 0.71 ng/mL

## 2011-01-31 LAB — BRAIN NATRIURETIC PEPTIDE: Pro B Natriuretic peptide (BNP): 424 pg/mL — ABNORMAL HIGH (ref 0.0–100.0)

## 2011-01-31 LAB — PROTIME-INR: Prothrombin Time: 18.3 seconds — ABNORMAL HIGH (ref 11.6–15.2)

## 2011-01-31 LAB — PHOSPHORUS: Phosphorus: 3.9 mg/dL (ref 2.3–4.6)

## 2011-01-31 LAB — DIC (DISSEMINATED INTRAVASCULAR COAGULATION)PANEL
D-Dimer, Quant: >20 ug/mL-FEU — ABNORMAL HIGH (ref 0.00–0.48)
Fibrinogen: 375 mg/dL (ref 204–475)
aPTT: 104 seconds — ABNORMAL HIGH (ref 24–37)

## 2011-01-31 LAB — APTT: aPTT: 144 seconds — ABNORMAL HIGH (ref 24–37)

## 2011-02-01 ENCOUNTER — Inpatient Hospital Stay (HOSPITAL_COMMUNITY): Payer: BC Managed Care – PPO

## 2011-02-01 DIAGNOSIS — D696 Thrombocytopenia, unspecified: Secondary | ICD-10-CM

## 2011-02-01 DIAGNOSIS — A419 Sepsis, unspecified organism: Secondary | ICD-10-CM

## 2011-02-01 DIAGNOSIS — I4891 Unspecified atrial fibrillation: Secondary | ICD-10-CM

## 2011-02-01 LAB — CBC
Hemoglobin: 8.3 g/dL — ABNORMAL LOW (ref 13.0–17.0)
MCH: 30.4 pg (ref 26.0–34.0)
MCV: 93.4 fL (ref 78.0–100.0)
Platelets: 98 10*3/uL — ABNORMAL LOW (ref 150–400)
RBC: 2.73 MIL/uL — ABNORMAL LOW (ref 4.22–5.81)
WBC: 21.9 10*3/uL — ABNORMAL HIGH (ref 4.0–10.5)

## 2011-02-01 LAB — BASIC METABOLIC PANEL
CO2: 27 mEq/L (ref 19–32)
Calcium: 7.3 mg/dL — ABNORMAL LOW (ref 8.4–10.5)
Chloride: 97 mEq/L (ref 96–112)
Creatinine, Ser: 1.28 mg/dL (ref 0.4–1.5)
GFR calc Af Amer: >60 mL/min (ref >60)
Sodium: 131 mEq/L — ABNORMAL LOW (ref 135–145)

## 2011-02-01 LAB — DIFFERENTIAL
Eosinophils Absolute: 0 10*3/uL (ref 0.0–0.7)
Lymphocytes Relative: 3 % — ABNORMAL LOW (ref 12–46)
Lymphs Abs: 0.6 10*3/uL — ABNORMAL LOW (ref 0.7–4.0)
Monocytes Relative: 2 % — ABNORMAL LOW (ref 3–12)
Neutro Abs: 20.8 10*3/uL — ABNORMAL HIGH (ref 1.7–7.7)
Neutrophils Relative %: 95 % — ABNORMAL HIGH (ref 43–77)

## 2011-02-01 LAB — GLUCOSE, CAPILLARY: Glucose-Capillary: 184 mg/dL — ABNORMAL HIGH (ref 70–99)

## 2011-02-01 LAB — PROTIME-INR: Prothrombin Time: 20.5 seconds — ABNORMAL HIGH (ref 11.6–15.2)

## 2011-02-01 LAB — APTT: aPTT: 101 seconds — ABNORMAL HIGH (ref 24–37)

## 2011-02-02 ENCOUNTER — Inpatient Hospital Stay (HOSPITAL_COMMUNITY): Payer: BC Managed Care – PPO

## 2011-02-02 LAB — POCT I-STAT 3, ART BLOOD GAS (G3+)
Acid-Base Excess: 1 mmol/L (ref 0.0–2.0)
Bicarbonate: 26.5 mEq/L — ABNORMAL HIGH (ref 20.0–24.0)
O2 Saturation: 96 %
Patient temperature: 98.1
Patient temperature: 99
TCO2: 28 mmol/L (ref 0–100)
pCO2 arterial: 52.5 mmHg — ABNORMAL HIGH (ref 35.0–45.0)
pH, Arterial: 7.337 — ABNORMAL LOW (ref 7.350–7.450)
pH, Arterial: 7.385 (ref 7.350–7.450)

## 2011-02-02 LAB — GLUCOSE, CAPILLARY
Glucose-Capillary: 180 mg/dL — ABNORMAL HIGH (ref 70–99)
Glucose-Capillary: 189 mg/dL — ABNORMAL HIGH (ref 70–99)
Glucose-Capillary: 177 mg/dL — ABNORMAL HIGH (ref 70–99)
Glucose-Capillary: 185 mg/dL — ABNORMAL HIGH (ref 70–99)
Glucose-Capillary: 210 mg/dL — ABNORMAL HIGH (ref 70–99)

## 2011-02-02 LAB — PHOSPHORUS: Phosphorus: 2.8 mg/dL (ref 2.3–4.6)

## 2011-02-02 LAB — CBC
HCT: 23.2 % — ABNORMAL LOW (ref 39.0–52.0)
MCH: 30.1 pg (ref 26.0–34.0)
MCHC: 31.9 g/dL (ref 30.0–36.0)
MCV: 94.3 fL (ref 78.0–100.0)
Platelets: 134 10*3/uL — ABNORMAL LOW (ref 150–400)
RDW: 15.1 % (ref 11.5–15.5)
WBC: 17.1 10*3/uL — ABNORMAL HIGH (ref 4.0–10.5)

## 2011-02-02 LAB — COMPREHENSIVE METABOLIC PANEL
Albumin: 1.9 g/dL — ABNORMAL LOW (ref 3.5–5.2)
Alkaline Phosphatase: 68 U/L (ref 39–117)
BUN: 35 mg/dL — ABNORMAL HIGH (ref 6–23)
Calcium: 7.3 mg/dL — ABNORMAL LOW (ref 8.4–10.5)
Creatinine, Ser: 1.13 mg/dL (ref 0.4–1.5)
Glucose, Bld: 182 mg/dL — ABNORMAL HIGH (ref 70–99)
Potassium: 5.1 mEq/L (ref 3.5–5.1)
Total Protein: 4.9 g/dL — ABNORMAL LOW (ref 6.0–8.3)

## 2011-02-02 LAB — BASIC METABOLIC PANEL
BUN: 35 mg/dL — ABNORMAL HIGH (ref 6–23)
Calcium: 7.3 mg/dL — ABNORMAL LOW (ref 8.4–10.5)
GFR calc non Af Amer: >60 mL/min (ref >60)
Glucose, Bld: 199 mg/dL — ABNORMAL HIGH (ref 70–99)
Sodium: 129 mEq/L — ABNORMAL LOW (ref 135–145)

## 2011-02-02 LAB — DIFFERENTIAL
Eosinophils Absolute: 0 10*3/uL (ref 0.0–0.7)
Eosinophils Relative: 0 % (ref 0–5)
Lymphocytes Relative: 2 % — ABNORMAL LOW (ref 12–46)
Lymphs Abs: 0.4 10*3/uL — ABNORMAL LOW (ref 0.7–4.0)
Monocytes Absolute: 0.6 10*3/uL (ref 0.1–1.0)

## 2011-02-02 LAB — BRAIN NATRIURETIC PEPTIDE: Pro B Natriuretic peptide (BNP): 296 pg/mL — ABNORMAL HIGH (ref 0.0–100.0)

## 2011-02-02 LAB — MAGNESIUM: Magnesium: 3.2 mg/dL — ABNORMAL HIGH (ref 1.5–2.5)

## 2011-02-02 LAB — PROTIME-INR: INR: 1.51 — ABNORMAL HIGH (ref 0.00–1.49)

## 2011-02-03 ENCOUNTER — Inpatient Hospital Stay (HOSPITAL_COMMUNITY): Payer: BC Managed Care – PPO

## 2011-02-03 DIAGNOSIS — I2699 Other pulmonary embolism without acute cor pulmonale: Secondary | ICD-10-CM

## 2011-02-03 DIAGNOSIS — D7582 Heparin induced thrombocytopenia (HIT): Secondary | ICD-10-CM

## 2011-02-03 LAB — GLUCOSE, CAPILLARY: Glucose-Capillary: 164 mg/dL — ABNORMAL HIGH (ref 70–99)

## 2011-02-03 LAB — COMPREHENSIVE METABOLIC PANEL
BUN: 36 mg/dL — ABNORMAL HIGH (ref 6–23)
CO2: 33 mEq/L — ABNORMAL HIGH (ref 19–32)
Calcium: 7.7 mg/dL — ABNORMAL LOW (ref 8.4–10.5)
Chloride: 97 mEq/L (ref 96–112)
Creatinine, Ser: 1.11 mg/dL (ref 0.4–1.5)
GFR calc Af Amer: >60 mL/min (ref >60)
GFR calc non Af Amer: >60 mL/min (ref >60)
Glucose, Bld: 216 mg/dL — ABNORMAL HIGH (ref 70–99)
Total Bilirubin: 0.8 mg/dL (ref 0.3–1.2)

## 2011-02-03 LAB — DIFFERENTIAL
Basophils Relative: 0 % (ref 0–1)
Eosinophils Absolute: 0 10*3/uL (ref 0.0–0.7)
Eosinophils Relative: 0 % (ref 0–5)
Monocytes Relative: 5 % (ref 3–12)
Neutrophils Relative %: 93 % — ABNORMAL HIGH (ref 43–77)

## 2011-02-03 LAB — CULTURE, BAL-QUANTITATIVE W GRAM STAIN

## 2011-02-03 LAB — APTT: aPTT: 42 seconds — ABNORMAL HIGH (ref 24–37)

## 2011-02-03 LAB — POCT I-STAT 3, ART BLOOD GAS (G3+)
Acid-Base Excess: 8 mmol/L — ABNORMAL HIGH (ref 0.0–2.0)
Acid-Base Excess: 14 mmol/L — ABNORMAL HIGH (ref 0.0–2.0)
Bicarbonate: 39.5 mEq/L — ABNORMAL HIGH (ref 20.0–24.0)
Patient temperature: 97.8
TCO2: 34 mmol/L (ref 0–100)
TCO2: 41 mmol/L (ref 0–100)
pH, Arterial: 7.451 — ABNORMAL HIGH (ref 7.350–7.450)
pO2, Arterial: 70 mmHg — ABNORMAL LOW (ref 80.0–100.0)

## 2011-02-03 LAB — CROSSMATCH
Antibody Screen: NEGATIVE
Unit division: 0

## 2011-02-03 LAB — CBC
MCH: 30 pg (ref 26.0–34.0)
MCHC: 32.2 g/dL (ref 30.0–36.0)
Platelets: 159 10*3/uL (ref 150–400)
RBC: 2.93 MIL/uL — ABNORMAL LOW (ref 4.22–5.81)
RDW: 16.4 % — ABNORMAL HIGH (ref 11.5–15.5)

## 2011-02-03 LAB — PROTIME-INR: Prothrombin Time: 16.7 seconds — ABNORMAL HIGH (ref 11.6–15.2)

## 2011-02-03 LAB — PROCALCITONIN: Procalcitonin: 0.38 ng/mL

## 2011-02-04 ENCOUNTER — Inpatient Hospital Stay (HOSPITAL_COMMUNITY): Payer: BC Managed Care – PPO

## 2011-02-04 LAB — CBC
HCT: 28.2 % — ABNORMAL LOW (ref 39.0–52.0)
Hemoglobin: 8.6 g/dL — ABNORMAL LOW (ref 13.0–17.0)
MCH: 28.9 pg (ref 26.0–34.0)
MCV: 94.6 fL (ref 78.0–100.0)
Platelets: 179 10*3/uL (ref 150–400)
RBC: 2.98 MIL/uL — ABNORMAL LOW (ref 4.22–5.81)
WBC: 13.2 10*3/uL — ABNORMAL HIGH (ref 4.0–10.5)

## 2011-02-04 LAB — BLOOD GAS, ARTERIAL
Drawn by: 290241
MECHVT: 420 mL
PEEP: 5 cmH2O
Patient temperature: 98
RATE: 24 resp/min
TCO2: 40.1 mmol/L (ref 0–100)
pH, Arterial: 7.43 (ref 7.350–7.450)

## 2011-02-04 LAB — COMPREHENSIVE METABOLIC PANEL
CO2: 39 mEq/L — ABNORMAL HIGH (ref 19–32)
Calcium: 7.8 mg/dL — ABNORMAL LOW (ref 8.4–10.5)
Creatinine, Ser: 1.12 mg/dL (ref 0.4–1.5)
GFR calc non Af Amer: >60 mL/min (ref >60)
Glucose, Bld: 165 mg/dL — ABNORMAL HIGH (ref 70–99)
Sodium: 141 mEq/L (ref 135–145)
Total Protein: 5.1 g/dL — ABNORMAL LOW (ref 6.0–8.3)

## 2011-02-04 LAB — POCT I-STAT 3, ART BLOOD GAS (G3+)
Acid-Base Excess: 16 mmol/L — ABNORMAL HIGH (ref 0.0–2.0)
Bicarbonate: 41.5 mEq/L — ABNORMAL HIGH (ref 20.0–24.0)
O2 Saturation: 93 %
Patient temperature: 98
TCO2: 43 mmol/L (ref 0–100)

## 2011-02-04 LAB — GLUCOSE, CAPILLARY
Glucose-Capillary: 172 mg/dL — ABNORMAL HIGH (ref 70–99)
Glucose-Capillary: 162 mg/dL — ABNORMAL HIGH (ref 70–99)
Glucose-Capillary: 165 mg/dL — ABNORMAL HIGH (ref 70–99)

## 2011-02-04 LAB — DIFFERENTIAL
Lymphocytes Relative: 3 % — ABNORMAL LOW (ref 12–46)
Lymphs Abs: 0.4 10*3/uL — ABNORMAL LOW (ref 0.7–4.0)
Monocytes Relative: 6 % (ref 3–12)
Neutro Abs: 12 10*3/uL — ABNORMAL HIGH (ref 1.7–7.7)
Neutrophils Relative %: 91 % — ABNORMAL HIGH (ref 43–77)

## 2011-02-04 LAB — APTT
aPTT: 46 seconds — ABNORMAL HIGH (ref 24–37)
aPTT: 47 seconds — ABNORMAL HIGH (ref 24–37)

## 2011-02-04 NOTE — Consult Note (Signed)
Alexander [name], Alexander [name]               ACCOUNT NO.:  [id number]  MEDICAL RECORD NO.:  [id number]           PATIENT TYPE:  I  LOCATION:  2313                         FACILITY:  [facility]  PHYSICIAN:  Alexander [name], M.D.DATE OF BIRTH:  January 02, 1936  DATE OF CONSULTATION:  01/31/2011 DATE OF DISCHARGE:                                CONSULTATION   This is a Hematology consultation requested to evaluate this man for possible heparin-associated thrombocytopenia with thrombosis.  Alexander [name] is a 75 year old man, who gives a history of longstanding intermittent atrial fibrillation/paroxysmal supraventricular tachyarrhythmias.  He was on no cardiac medications prior to this admission.  He presented on January 16, 2011, with sudden onset of retrosternal chest pressure radiating to his arm.  He was admitted for further evaluation.  Due to elevation of serum troponin's, it was felt he likely had an acute coronary syndrome/non-Q-wave myocardial infarction.  He was started on heparin on admission pending further evaluation.  He underwent cardiac catheterization on January 17, 2011.  He was found to have severe two-vessel coronary disease.  Revascularization surgery was recommended.  He underwent a three-vessel coronary bypass graft on January 20, 2011, by Alexander [name].  He underwent a comitant Maze procedure with ligation of the left atrial appendage and a radiofrequency ablation procedure on the left atrium.  He tolerated the surgery well.  He developed recurrent atrial fibrillation on January 24, 2011.  He was initially treated with a beta-blocker.  A Cardizem infusion was started on January 26, 2011.  Amiodarone was added on January 28, 2011.  He developed increasing respiratory distress.  Initial chest radiograph showed a evolving right upper lobe pneumonic process and he was started on antibiotics.  Due to progressive symptoms and intermittent arrhythmias, further  evaluation was done.  An echocardiogram showed a preserved ejection fraction.  However, a CT angiogram showed a dense right upper lobe infiltrate and in addition, small nonocclusive pulmonary emboli in the right upper, right middle and right lower lobe branches.  There were also bilateral pleural effusions.  Heparin was resumed on January 29, 2011.  Antibiotics were started.  Over the last 48 hours, he has had progressive respiratory distress.  A abrupt fall in his platelet count was noted.  He had the usual dip in his count on the day of the surgery with presurgical count 244,000 on January 17, 2011 falling to 105,000 on January 20, 2011, but fully recovering to 195,000 by January 26, 2011.  Platelet count still normal at 189,000 on January 29, 2011.  Yesterday count fell to 112,000 and today 65,000.  As noted above, pulmonary emboli diagnosed on January 29, 2011 and heparin resumed.  All heparin was stopped today.  A heparin antibody panel was obtained and results are pending.  He was started on a direct thrombin inhibitor with Refludan.  PAST MEDICAL HISTORY:  In addition to above, notable only for degenerative arthritis on chronic Clinoril.  Only prior surgery was bilateral inguinal hernia repairs.  Medications during this admission include aspirin, heparin, Fortabs started February 21, Elavil February 19, Cardizem February 19, amiodarone February 21, Flagyl February 24, Solu-Medrol  February 24, Protonix February 14, Clinoril February 16, vancomycin February 22, Coumadin single dose given January 30, 2011.  He is on oxygen, Xopenex, Combivent, p.r.n. valium, Flexeril, Norco and Ultram.  ALLERGY:  To PENICILLIN.  Intolerance to ASPIRIN in the past with mild respiratory distress.  FAMILY HISTORY:  Negative for any blood disorders.  He has a brother 2 years younger than him who is healthy.  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  He  does not use alcohol.  He does not smoke.  Wife accompanies him today.  He has 1 son who lives in [place].  PHYSICAL EXAMINATION:  GENERAL:  Caucasian man in moderate respiratory distress and blood pressure 127/67, pulse 89 and currently regular, respirations approximately 28, oxygen saturation 95% on BiPAP with 50% FIO2, temperature 97.6. SKIN:  There are ecchymoses on the abdominal wall, where he has had Lovenox injections.  The median sternotomy wound has completely healed. There are few residual electrodes in place on the abdominal wall. EYES:  The pupils are equal and reactive to light. NEUROLOGIC:  He is alert and oriented.  Motor strength is 5/5.  Reflexes trace to 1+ at the knees. CARDIOLOGIC:  Regular cardiac rhythm.  No murmur, gallop or rub. LUNGS:  Upper lungs are clear rales at the bases. ABDOMEN:  Soft, moderately distended, tympanitic.  No mass, tenderness or organomegaly. EXTREMITIES:  Peripheral edema 2+.  No cyanosis.  Review of the peripheral blood film is pending.  Pertinent lab today; hemoglobin 8.9, hematocrit 26, white count 25,000. No differential platelet 65,000.  Pre surgery hemoglobin on January 17, 2011, was 14 with white count 7500 and platelets 244,000.  Blood chemistries with a BUN 23, creatinine 1.2, bilirubin mildly elevated at 1.7, alkaline phosphatase 82, SGOT 24, SGPT 31, albumin 2.3.  IMPRESSION:  Greater than 50% fall in platelet count concomitant with findings of acute pulmonary emboli in a man who resumed heparin 8 days post three-vessel coronary bypass graft with concomitant radiofrequency ablation and Maze procedure.  I feel there is a high clinical likelihood that he has developed heparin- associated thrombocytopenia with thrombosis (acute pulmonary emboli). There is likely a large inflammatory component to his pulmonary emboli as well with a dense right upper lobe pulmonary infiltrate.  However, I would not have expected a simple  pneumonia to have caused his platelet count to fall.  He was previously sensitized with heparin at the time of admission and again at time of cardiac surgery.  Rapid time course upon resuming heparin on the day that a CT scan showed pulmonary emboli is consistent with previous sensitization.  Although, other medications such as amiodarone might cause thrombocytopenia, I think in his case it is more likely that it was the heparin.  RECOMMENDATIONS:  I agree with stopping all heparin products including line flushes.  I agree with beginning a direct thrombin inhibitor such as Refludan which has already been started.  I would stop all Coumadin and do not resume until full platelet recovery.  When Coumadin is resumed do not use any loading doses in order to avoid warfarin-induced skin necrosis and thrombosis.  I agree with stopping his aspirin and would also stopped his nonsteroidal.  A heparin-associated thrombocytopenia panel has been obtained and results are pending.  We will obviously monitor his blood counts on a daily basis until recovery.  Thank you for this consultation.  I will follow with you.     Alexander Churn. Cyndie Chime, M.D.  JMG/MEDQ  D:  01/31/2011  T:  02/01/2011  Job:  [id number]  cc:   Salvatore Decent. Dorris Fetch, M.D. Lyn Records, M.D. Kalman Shan, MD Lenon Curt. Chilton Si, M.D.  Electronically Signed by Cephas Darby M.D. on 02/04/2011 09:38:19 AM

## 2011-02-05 ENCOUNTER — Inpatient Hospital Stay (HOSPITAL_COMMUNITY): Payer: BC Managed Care – PPO

## 2011-02-05 DIAGNOSIS — D7582 Heparin induced thrombocytopenia (HIT): Secondary | ICD-10-CM

## 2011-02-05 DIAGNOSIS — D75829 Heparin-induced thrombocytopenia, unspecified: Secondary | ICD-10-CM

## 2011-02-05 LAB — COMPREHENSIVE METABOLIC PANEL
ALT: 85 U/L — ABNORMAL HIGH (ref 0–53)
Albumin: 2.2 g/dL — ABNORMAL LOW (ref 3.5–5.2)
Alkaline Phosphatase: 79 U/L (ref 39–117)
BUN: 39 mg/dL — ABNORMAL HIGH (ref 6–23)
Chloride: 90 mEq/L — ABNORMAL LOW (ref 96–112)
Potassium: 4.2 mEq/L (ref 3.5–5.1)
Total Bilirubin: 1.2 mg/dL (ref 0.3–1.2)

## 2011-02-05 LAB — GLUCOSE, CAPILLARY
Glucose-Capillary: 114 mg/dL — ABNORMAL HIGH (ref 70–99)
Glucose-Capillary: 116 mg/dL — ABNORMAL HIGH (ref 70–99)
Glucose-Capillary: 117 mg/dL — ABNORMAL HIGH (ref 70–99)

## 2011-02-05 LAB — DIFFERENTIAL
Eosinophils Relative: 0 % (ref 0–5)
Lymphocytes Relative: 3 % — ABNORMAL LOW (ref 12–46)
Lymphs Abs: 0.5 10*3/uL — ABNORMAL LOW (ref 0.7–4.0)
Monocytes Absolute: 0.7 10*3/uL (ref 0.1–1.0)

## 2011-02-05 LAB — CBC
HCT: 33.3 % — ABNORMAL LOW (ref 39.0–52.0)
MCHC: 31.8 g/dL (ref 30.0–36.0)
MCV: 94.6 fL (ref 78.0–100.0)
RDW: 16.1 % — ABNORMAL HIGH (ref 11.5–15.5)
WBC: 14.7 10*3/uL — ABNORMAL HIGH (ref 4.0–10.5)

## 2011-02-05 LAB — APTT
aPTT: 49 seconds — ABNORMAL HIGH (ref 24–37)
aPTT: 45 seconds — ABNORMAL HIGH (ref 24–37)

## 2011-02-06 ENCOUNTER — Inpatient Hospital Stay (HOSPITAL_COMMUNITY): Payer: BC Managed Care – PPO

## 2011-02-06 DIAGNOSIS — J9600 Acute respiratory failure, unspecified whether with hypoxia or hypercapnia: Secondary | ICD-10-CM

## 2011-02-06 DIAGNOSIS — D75829 Heparin-induced thrombocytopenia, unspecified: Secondary | ICD-10-CM

## 2011-02-06 DIAGNOSIS — J90 Pleural effusion, not elsewhere classified: Secondary | ICD-10-CM

## 2011-02-06 DIAGNOSIS — D7582 Heparin induced thrombocytopenia (HIT): Secondary | ICD-10-CM

## 2011-02-06 LAB — BASIC METABOLIC PANEL
Chloride: 95 mEq/L — ABNORMAL LOW (ref 96–112)
Creatinine, Ser: 1.14 mg/dL (ref 0.4–1.5)
GFR calc Af Amer: >60 mL/min (ref >60)
GFR calc non Af Amer: >60 mL/min (ref >60)
Potassium: 3.7 mEq/L (ref 3.5–5.1)

## 2011-02-06 LAB — GLUCOSE, CAPILLARY
Glucose-Capillary: 108 mg/dL — ABNORMAL HIGH (ref 70–99)
Glucose-Capillary: 124 mg/dL — ABNORMAL HIGH (ref 70–99)
Glucose-Capillary: 193 mg/dL — ABNORMAL HIGH (ref 70–99)

## 2011-02-06 LAB — CBC
HCT: 30.8 % — ABNORMAL LOW (ref 39.0–52.0)
MCV: 93.6 fL (ref 78.0–100.0)
Platelets: 254 10*3/uL (ref 150–400)
RBC: 3.29 MIL/uL — ABNORMAL LOW (ref 4.22–5.81)
WBC: 12.7 10*3/uL — ABNORMAL HIGH (ref 4.0–10.5)

## 2011-02-06 LAB — APTT
aPTT: 61 seconds — ABNORMAL HIGH (ref 24–37)
aPTT: 62 seconds — ABNORMAL HIGH (ref 24–37)

## 2011-02-07 ENCOUNTER — Inpatient Hospital Stay (HOSPITAL_COMMUNITY): Payer: BC Managed Care – PPO

## 2011-02-07 DIAGNOSIS — I2699 Other pulmonary embolism without acute cor pulmonale: Secondary | ICD-10-CM

## 2011-02-07 LAB — CBC
MCH: 29.9 pg (ref 26.0–34.0)
MCHC: 32.5 g/dL (ref 30.0–36.0)
MCV: 91.9 fL (ref 78.0–100.0)
Platelets: 269 10*3/uL (ref 150–400)
RDW: 15.1 % (ref 11.5–15.5)
WBC: 11.4 10*3/uL — ABNORMAL HIGH (ref 4.0–10.5)

## 2011-02-07 LAB — HEPARIN INDUCED THROMBOCYTOPENIA PNL
Heparin Induced Plt Ab: POSITIVE
Patient O.D.: 2.578
UFH High Dose UFH H: 2 % Release
UFH Low Dose 0.1 IU/mL: 82 % Release

## 2011-02-07 LAB — BASIC METABOLIC PANEL
BUN: 30 mg/dL — ABNORMAL HIGH (ref 6–23)
CO2: 34 mEq/L — ABNORMAL HIGH (ref 19–32)
Chloride: 91 mEq/L — ABNORMAL LOW (ref 96–112)
Creatinine, Ser: 0.9 mg/dL (ref 0.4–1.5)
Glucose, Bld: 106 mg/dL — ABNORMAL HIGH (ref 70–99)
Potassium: 3.6 mEq/L (ref 3.5–5.1)

## 2011-02-07 LAB — GLUCOSE, CAPILLARY
Glucose-Capillary: 106 mg/dL — ABNORMAL HIGH (ref 70–99)
Glucose-Capillary: 132 mg/dL — ABNORMAL HIGH (ref 70–99)

## 2011-02-07 LAB — APTT: aPTT: 67 seconds — ABNORMAL HIGH (ref 24–37)

## 2011-02-08 ENCOUNTER — Inpatient Hospital Stay (HOSPITAL_COMMUNITY): Payer: BC Managed Care – PPO

## 2011-02-08 LAB — PROTIME-INR: Prothrombin Time: 16.8 seconds — ABNORMAL HIGH (ref 11.6–15.2)

## 2011-02-08 LAB — CBC
MCH: 29.4 pg (ref 26.0–34.0)
MCHC: 32.6 g/dL (ref 30.0–36.0)
MCV: 90.2 fL (ref 78.0–100.0)
Platelets: 313 10*3/uL (ref 150–400)

## 2011-02-08 LAB — GLUCOSE, CAPILLARY
Glucose-Capillary: 120 mg/dL — ABNORMAL HIGH (ref 70–99)
Glucose-Capillary: 142 mg/dL — ABNORMAL HIGH (ref 70–99)

## 2011-02-08 LAB — BASIC METABOLIC PANEL
BUN: 25 mg/dL — ABNORMAL HIGH (ref 6–23)
CO2: 34 mEq/L — ABNORMAL HIGH (ref 19–32)
Calcium: 7.4 mg/dL — ABNORMAL LOW (ref 8.4–10.5)
GFR calc non Af Amer: >60 mL/min (ref >60)
Glucose, Bld: 99 mg/dL (ref 70–99)
Potassium: 3.8 mEq/L (ref 3.5–5.1)
Sodium: 137 mEq/L (ref 135–145)

## 2011-02-09 LAB — GLUCOSE, CAPILLARY
Glucose-Capillary: 99 mg/dL (ref 70–99)
Glucose-Capillary: 142 mg/dL — ABNORMAL HIGH (ref 70–99)
Glucose-Capillary: 119 mg/dL — ABNORMAL HIGH (ref 70–99)

## 2011-02-09 LAB — CLOSTRIDIUM DIFFICILE BY PCR

## 2011-02-10 LAB — CBC
MCH: 28.7 pg (ref 26.0–34.0)
MCHC: 32.5 g/dL (ref 30.0–36.0)
Platelets: 331 10*3/uL (ref 150–400)

## 2011-02-10 LAB — BASIC METABOLIC PANEL
CO2: 33 mEq/L — ABNORMAL HIGH (ref 19–32)
Calcium: 7.1 mg/dL — ABNORMAL LOW (ref 8.4–10.5)
Creatinine, Ser: 0.88 mg/dL (ref 0.4–1.5)
GFR calc Af Amer: >60 mL/min (ref >60)

## 2011-02-10 LAB — GLUCOSE, CAPILLARY

## 2011-02-10 LAB — PROTIME-INR
INR: 1.59 — ABNORMAL HIGH (ref 0.00–1.49)
Prothrombin Time: 19.1 seconds — ABNORMAL HIGH (ref 11.6–15.2)

## 2011-02-10 LAB — APTT
aPTT: 90 seconds — ABNORMAL HIGH (ref 24–37)
aPTT: 83 seconds — ABNORMAL HIGH (ref 24–37)

## 2011-02-11 ENCOUNTER — Inpatient Hospital Stay (HOSPITAL_COMMUNITY): Payer: BC Managed Care – PPO

## 2011-02-11 LAB — CBC
Hemoglobin: 9.5 g/dL — ABNORMAL LOW (ref 13.0–17.0)
MCHC: 33.3 g/dL (ref 30.0–36.0)
Platelets: 363 10*3/uL (ref 150–400)

## 2011-02-11 LAB — APTT: aPTT: 71 seconds — ABNORMAL HIGH (ref 24–37)

## 2011-02-11 LAB — BASIC METABOLIC PANEL
BUN: 18 mg/dL (ref 6–23)
Calcium: 7.2 mg/dL — ABNORMAL LOW (ref 8.4–10.5)
Creatinine, Ser: 0.94 mg/dL (ref 0.4–1.5)
GFR calc non Af Amer: >60 mL/min (ref >60)
Glucose, Bld: 106 mg/dL — ABNORMAL HIGH (ref 70–99)
Sodium: 134 mEq/L — ABNORMAL LOW (ref 135–145)

## 2011-02-11 LAB — PROTIME-INR
INR: 1.63 — ABNORMAL HIGH (ref 0.00–1.49)
Prothrombin Time: 19.5 seconds — ABNORMAL HIGH (ref 11.6–15.2)

## 2011-02-11 LAB — GLUCOSE, CAPILLARY: Glucose-Capillary: 147 mg/dL — ABNORMAL HIGH (ref 70–99)

## 2011-02-11 NOTE — Consult Note (Addendum)
NAMETERRELL, OSTRAND               ACCOUNT NO.:  [id number]  MEDICAL RECORD NO.:  [id number]           PATIENT TYPE:  I  LOCATION:  2309                         FACILITY:  [facility]  PHYSICIAN:  Salvatore Decent. Dorris Fetch, M.D.DATE OF BIRTH:  Sep 27, 1936  DATE OF CONSULTATION:  01/17/2011 DATE OF DISCHARGE:                                CONSULTATION   REASON FOR CONSULTATION:  Severe two-vessel coronary artery disease.  HISTORY OF PRESENT ILLNESS:  Alexander [name] is a 75 year old gentleman who presented to the hospital yesterday evening after having onset of chest pain and bilateral arm pain earlier in the day.  He said he was at work at [employer] when he developed this chest discomfort associated with shortness of breath and bilateral arm pain that lasted about 2 hours. It waxed and waned a little, but never quite went away.  He says has had a history of angina for about 10 years, but this was much more severe and much more prolonged than the episode he has ever had before.  He does have a history of previous Cardiolites which were negative, the most recent one about 3 years ago.  He also has a history of paroxysmal atrial fibrillation.  He will have episodes as frequent as every 2 weeks that was infrequently every 3 months.  His heart races and it will resolve within 8-9 hours spontaneously.  He has been pain free since admission to the hospital.  There, he underwent cardiac catheterization where he was found to have normal left ventricular function.  He had severe two-vessel disease with a complex LAD diagonal lesion as well as a 60-70% right coronary stenosis.  He currently is pain free.  PAST MEDICAL HISTORY:  Significant for paroxysmal atrial fibrillation, obstructive sleep apnea, hyperlipidemia, exercise-induced asthma, and anxiety.  MEDICATIONS PRIOR TO ADMISSION: 1. Sulindac 200 mg daily. 2. Flaxseed oil 1000 mg daily. 3. Centrum 1 vitamin daily. 4. Albuterol as needed, but  seldom has to use that. 5. Amitriptyline 25 mg tablets, he takes one-fourth of a tablet     nightly. 6. Xanax 0.25 mg nightly. 7. Diazepam 5 mg p.o. nightly.  He has intolerance to ASPIRIN which causes stomach upset.  He has intolerance to PLAVIX which he feels exacerbates his atrial fibrillation.  He has also intolerance to COUMADIN.  FAMILY HISTORY:  Significant for atrial fibrillation in brother. Neither parent had significant cardiac disease.  SOCIAL HISTORY:  He is still active.  He works at [employer] full time. He lives with his wife.  Remote smoking history, but quit when he was 75 years old.  REVIEW OF SYSTEMS:  Really he felt well up until this episode.  He would have occasional chest pain.  Did have the atrial fibrillation episodes as previously mentioned, but by and large exercised.  No change in dietary habits.  No change in bowel or bladder habits.  All other systems are negative.  PHYSICAL EXAMINATION:  GENERAL:  Alexander [name] is a 75 year old well- appearing male in no acute distress, very animated, talks quickly. VITAL SIGNS:  Stable. NEUROLOGICAL:  He is alert and oriented x3 with no  focal deficits, mildly anxious. HEENT:  Unremarkable.  He is wearing glasses. NECK:  Supple without thyromegaly, adenopathy, or bruits. CARDIAC:  Regular rate and rhythm.  Normal S1 and S2.  No rubs, murmurs, or gallops. LUNGS:  Clear with equal breath sounds bilaterally.  No rales or wheezing. ABDOMEN:  Soft and nontender. EXTREMITIES:  Without clubbing, cyanosis, or edema.  He has 2+ pulses throughout. SKIN:  Warm and dry.  LABORATORY DATA:  CK 4.9 and troponin 0.11.  Creatinine 1.15, sodium 141, potassium 4.2, and BUN 13.  PT is 14.3.  White count 8.0, hematocrit 43, and platelets 262.  Carotid duplex showed no evidence of ICA stenosis.  EKG shows left axis deviation and nonspecific ST abnormalities.  IMPRESSION:  Alexander [name] is a 75 year old gentleman who presents  with unstable coronary syndrome.  At catheterization, he has severe two- vessel disease with complex LAD disease.  Coronary artery bypass grafting is indicated for survival benefit as well as relief of symptoms and myocardial preservation.  I discussed at length with the patient the indications, risks, benefits, and alternative procedures.  He understands the risks include but not limited to death, stroke, MI, DVT, PE, bleeding, possible need for transfusions, infections as well as other organ system dysfunction including respiratory, renal, and GI complications.  He is relatively a low-risk candidate for surgery.  He understands and accepts these risks and agrees to proceed.  He also has a history of paroxysmal atrial fibrillation for many years, probably almost 50 years now that he has relatively frequently, but it resolves spontaneously and does not have to be hospitalized.  I did offer him the option of maze procedure.  Certainly, I think we should ligate his left atrial appendage regardless with the left-sided maze. Essentially, a pulmonary vein isolation might reduce his risk for ongoing paroxysmal atrial fibrillation.  We did discuss this and the risk that he could develop bradycardia and require pacemaker.  He is anxious about that aspect, but otherwise interested in the procedure and he wishes to think about that for awhile before he makes a decision.  We will go ahead and schedule him for surgery on Monday morning first case for coronary artery bypass grafting and possible maze procedure.     Salvatore Decent Dorris Fetch, M.D.     SCH/MEDQ  D:  01/17/2011  T:  01/18/2011  Job:  [id number]  cc:   Lyn Records, M.D.  Electronically Signed by Charlett Lango M.D. on 02/11/2011 12:34:21 PM Electronically Signed by Charlett Lango M.D. on 02/11/2011 12:52:50 PM Electronically Signed by Charlett Lango M.D. on 02/11/2011 01:10:53 PM Electronically Signed by Charlett Lango M.D. on 02/11/2011 01:32:23 PM Electronically Signed by Charlett Lango M.D. on 02/11/2011 01:53:04 PM Electronically Signed by Charlett Lango M.D. on 02/11/2011 02:13:02 PM Electronically Signed by Charlett Lango M.D. on 02/11/2011 02:33:58 PM Electronically Signed by Charlett Lango M.D. on 02/11/2011 02:57:36 PM Electronically Signed by Charlett Lango M.D. on 02/11/2011 03:22:23 PM Electronically Signed by Charlett Lango M.D. on 02/11/2011 03:46:37 PM Electronically Signed by Charlett Lango M.D. on 02/11/2011 04:54:09 PM Electronically Signed by Charlett Lango M.D. on 02/11/2011 07:32:31 PM

## 2011-02-11 NOTE — Op Note (Addendum)
Alexander [name], Alexander [name]               ACCOUNT NO.:  [id number]  MEDICAL RECORD NO.:  [id number]           PATIENT TYPE:  I  LOCATION:  2309                         FACILITY:  [facility]  PHYSICIAN:  Salvatore Decent. Dorris Fetch, M.D.DATE OF BIRTH:  [birth year]  DATE OF PROCEDURE:  01/20/2011 DATE OF DISCHARGE:                              OPERATIVE REPORT   PREOPERATIVE DIAGNOSES:  Severe two-vessel coronary artery disease status post non-Q-wave myocardial infarction, longstanding paroxysmal atrial fibrillation.  POSTOPERATIVE DIAGNOSES:  Severe two-vessel coronary artery disease status post non-Q-wave myocardial infarction, longstanding paroxysmal atrial fibrillation.  PROCEDURES:  Median sternotomy, extracorporeal circulation, coronary artery bypass grafting x3 (left internal mammary artery to left anterior descending, saphenous vein graft to first diagonal, saphenous vein graft to distal right coronary), endoscopic vein harvest right thigh, left- sided maze procedure with ligation of the left atrial appendage.  SURGEON:  Salvatore Decent. Dorris Fetch, MD  ASSISTANT:  Stephanie Acre. Dasovich, PA  ANESTHESIA:  General.  FINDINGS:  Intrapericardial adhesions consistent with remote pericarditis, good-quality targets, mammary good-quality, vein fair- quality, preserved left ventricular function with trace to 1+ mitral regurgitation by transesophageal echocardiography.  CLINICAL NOTE:  Alexander [name] is a 75 year old gentleman presented with unstable coronary artery syndrome ruled in for non-Q-wave myocardial infarction.  At catheterization, he had severe two-vessel coronary artery disease with complex LAD lesion at the bifurcation of the diagonal.  He also has a longstanding history of paroxysmal atrial fibrillation and intolerance to Coumadin.  He has been having episodes of atrial fibrillation with increasing frequency and severity over the past 6 months.  The patient was advised to undergo coronary  artery bypass grafting for revascularization, also advised to have left-sided maze procedure for pulmonary vein isolation during the procedure.  The indications, risks, benefits and alternatives of both portions of procedure were discussed in detail with the patient.  He understood and accepted the risks and wish to proceed.  OPERATIVE NOTE:  Alexander [name] was brought to the preop holding area on January 20, 2011.  There in Anesthesia Service, placed Swan-Ganz catheter and arterial blood pressure monitoring line and intravenous access.  Intravenous antibiotics were administered.  The patient was taken to the operating room, anesthetized and intubated. Transesophageal echocardiography was performed.  Please refer to Dr. Pete Glatter separately dictated note for details.  Did show mild global hypokinesis and trace to 1+ mitral regurgitation.  A Foley catheter was placed.  The chest, abdomen and legs were prepped and draped in usual sterile fashion.  An incision was made at the level of the knee.  On the right medial aspect of the right leg, greater saphenous vein was identified and was harvested endoscopically.  There was a fair-quality vessel.  Simultaneously, a median sternotomy was performed and the left internal mammary artery was harvested using standard technique.  There was a good-quality vessel, 2000 units of heparin was administered during the vessel harvest.  Remainder of the full-dose heparin was given prior to opening the pericardium.  The pericardium was incised.  It was immediately obvious there were significant intrapericardial adhesions.  These were taken down over the anterior surface of the heart  and the right atrium as well as the aorta to allow cannulation.  After confirming anticoagulation with ACT measurement.  The aorta was cannulated via concentric 2-0 Ethibond pledgeted pursestring sutures.  A dual-stage venous cannula was placed via purse-string suture in the  rectal appendage.  Cardiopulmonary bypass was instituted and the patient was cooled to 32-degree Celsius.  The remainder of the intrapericardial adhesions were taken down.  The left- sided pulmonary veins were encircled.  An ablation was performed on the left atrium just proximal to the insertion of the left-sided pulmonary veins using bipolar radiofrequency ablation.  Two lines were placed in parallel.  Transmural tube was confirmed with each ablation line.  The base of the left atrial appendage then was ablated again.  Two parallel bipolar ablation lines were performed.  The left atrial appendage then was occluded using the AtriCure AtriClip device, 40 mm device was utilized.  Coronary arteries were inspected and anastomotic sites were chosen.  The conduits were inspected and cut to length.  A foam pad was placed pericardium to insulate the heart.  Temperature probe was placed in myocardial septum and a cardioplegic cannula was placed in the ascending aorta.  The aorta was crossclamped.  The left ventricle was emptied via the aortic root vent.  Cardiac arrest was then achieved with a combination of cold antegrade blood cardioplegia and topical iced saline.  One liter of cardioplegia was administered.  The myocardial septal temperature fell to 9 degrees Celsius, following distal anastomoses were performed.  First, a reverse saphenous vein grafts was placed end-to-side to the distal right coronary.  This was a 2-mm good-quality target.  Vein was fair quality, was anastomosed end-to-side with a running 7-0 Prolene suture.  There was excellent flow of the graft.  Cardioplegia was administered.  There was good hemostasis.  Next, a reverse saphenous vein graft was placed end-to-side to the first diagonal.  This vessel was slightly intramyocardial but was difficult to isolate initially to the previous pericardial adhesions, was a 1.5-mm good-quality target.  The vein graft was anastomosed  end-to-side with a running 7-0 Prolene suture and there was excellent flow through the graft and good flow with cardioplegia administration as well as good hemostasis.  Next, the left internal mammary artery was brought through a window in the pericardium.  The distal end was beveled, was then anastomosed end- to-side to the distal LAD which was a 2-mm good-quality target.  The mammary was a 2-mm good-quality conduit.  At completion of the mammary to LAD anastomosis, the bulldog clamp was briefly removed to inspect for hemostasis.  Immediate rapid septal rewarming was noted.  The bulldog clamp was replaced and the mammary pedicle was tacked to the epicardial surface of the heart with 6-0 Prolene sutures.  Additional cardioplegia was administered down the vein graft from the aortic root.  The interatrial groove was dissected out and the left atrium was entered just proximal to the insertion of the right-sided pulmonary veins.  Posterior ablation line was then placed to complete the ablation in that area again using the bipolar device with 2 parallel ablation lines.  The monopolar radiofrequency ablation attempt was then used to create connecting lesions from the left-sided pulmonary veins to the base of the left atrial appendage between the pulmonary veins and extending to the mitral annulus.  The left atriotomy then was closed with running 4-0 Prolene suture.  First layer was a running horizontal mattress suture, followed by a running simple suture.  The left atrium  was de-aired prior to tying the sutures.  Additional cardioplegia was administered.  The vein grafts were cut to length.  The proximal vein graft anastomoses were performed to 4.0 mm punch aortotomies with running 6-0 Prolene sutures at the completion of final proximal anastomosis.  The patient was placed in Trendelenburg position.  Lidocaine was administered.  The bulldog clamps again removed from the left mammary artery.   After additional de-airing, the aortic crossclamp was removed.  Total crossclamp time was 72 minutes.  The patient initially fibrillated and required single defibrillation with 10 joules and was in sinus rhythm thereafter, although bradycardic. While rewarming was completed, all proximal and distal anastomoses and suture lines were inspected for hemostasis.  Epicardial pacing wires were placed on the right ventricle and right atrium and DDD pacing was initiated and the patient rewarmed to a core temperature of 37 degrees Celsius, he was weaning from cardiopulmonary bypass on the first attempt.  The total bypass time was 121 minutes.  The initial cardiac output was 3.0 but with volume administration it increased to 4.5.  The patient did not require inotropic support.  Test dose of protamine was administered and was well tolerated.  The atrial and aortic cannulae were removed.  The remainder of protamine was administered without incident.  The chest was irrigated with warm saline.  Hemostasis was achieved.  The pericardium was reapproximated over the ascending aorta but would not reapproximate over the heart itself.  The left pleural and two mediastinal chest tubes were placed through septal subcostal incisions.  The sternum was closed with combination of single and double heavy gauge stainless steel wires. Pectoralis fascia, subcutaneous tissue and skin were closed in standard fashion.  All sponge, needle and instrument counts were correct at the end of the procedure.  The patient was taken from the operating room to the surgical intensive care unit in good condition.     Salvatore Decent Dorris Fetch, M.D.     SCH/MEDQ  D:  01/20/2011  T:  01/21/2011  Job:  [id number]  cc:   Lyn Records, M.D. Lenon Curt Chilton Si, M.D.  Electronically Signed by Charlett Lango M.D. on 02/11/2011 12:34:33 PM Electronically Signed by Charlett Lango M.D. on 02/11/2011 12:39:45 PM Electronically  Signed by Charlett Lango M.D. on 02/11/2011 12:57:53 PM Electronically Signed by Charlett Lango M.D. on 02/11/2011 01:16:33 PM Electronically Signed by Charlett Lango M.D. on 02/11/2011 01:38:22 PM Electronically Signed by Charlett Lango M.D. on 02/11/2011 01:58:22 PM Electronically Signed by Charlett Lango M.D. on 02/11/2011 02:18:19 PM Electronically Signed by Charlett Lango M.D. on 02/11/2011 02:40:47 PM Electronically Signed by Charlett Lango M.D. on 02/11/2011 03:03:58 PM Electronically Signed by Charlett Lango M.D. on 02/11/2011 03:28:53 PM Electronically Signed by Charlett Lango M.D. on 02/11/2011 03:54:58 PM Electronically Signed by Charlett Lango M.D. on 02/11/2011 04:02:41 PM Electronically Signed by Charlett Lango M.D. on 02/11/2011 04:33:52 PM Electronically Signed by Charlett Lango M.D. on 02/11/2011 05:05:23 PM Electronically Signed by Charlett Lango M.D. on 02/11/2011 05:05:23 PM Electronically Signed by Charlett Lango M.D. on 02/11/2011 05:32:44 PM Electronically Signed by Charlett Lango M.D. on 02/11/2011 05:32:44 PM Electronically Signed by Charlett Lango M.D. on 02/11/2011 06:15:17 PM Electronically Signed by Charlett Lango M.D. on 02/11/2011 19:14:78 PM Electronically Signed by Charlett Lango M.D. on 02/11/2011 07:32:31 PM

## 2011-02-12 ENCOUNTER — Encounter: Payer: Medicare Other | Admitting: Thoracic Surgery (Cardiothoracic Vascular Surgery)

## 2011-02-12 LAB — PROTIME-INR: Prothrombin Time: 21.5 seconds — ABNORMAL HIGH (ref 11.6–15.2)

## 2011-02-13 DIAGNOSIS — Z7901 Long term (current) use of anticoagulants: Secondary | ICD-10-CM

## 2011-02-13 DIAGNOSIS — D696 Thrombocytopenia, unspecified: Secondary | ICD-10-CM

## 2011-02-13 LAB — BASIC METABOLIC PANEL
GFR calc non Af Amer: >60 mL/min (ref >60)
Glucose, Bld: 97 mg/dL (ref 70–99)
Potassium: 4 mEq/L (ref 3.5–5.1)
Sodium: 131 mEq/L — ABNORMAL LOW (ref 135–145)

## 2011-02-13 LAB — PROTIME-INR: INR: 2.24 — ABNORMAL HIGH (ref 0.00–1.49)

## 2011-02-14 NOTE — Procedures (Signed)
NAMECAMEREN, Alexander [name]               ACCOUNT NO.:  [id number]  MEDICAL RECORD NO.:  [id number]           PATIENT TYPE:  I  LOCATION:  2309                         FACILITY:  [facility]  PHYSICIAN:  Lyn Records, M.D.   DATE OF BIRTH:  1936-03-12  DATE OF PROCEDURE:  01/17/2011 DATE OF DISCHARGE:                           CARDIAC CATHETERIZATION   INDICATION:  New-onset angina with minimal exertion and trace positive enzymes.  PROCEDURE PERFORMED: 1. Left heart cath via right radial approach. 2. Coronary angiography. 3. Left ventriculography.  DESCRIPTION:  After informed consent, 5-French sheath was placed in the right femoral artery using modified Seldinger technique.  A 5-French A2 multipurpose catheter was then used for hemodynamic recordings, left ventriculography by hand injection, and right coronary angiography.  We used a 5-French 3.5 left Judkins catheter for left coronary angiography. We used a 5-French JR-4 catheter for right coronary angiography to improve visualization.  All catheter exchanges were performed over a tight J 300-cm long exchange wire.  The patient received 3 mg of Versed and 75 mcg of fentanyl.  A small amount of Xylocaine was used in the radial subcuticular region for anesthesia.  No complications occurred. Wristband was applied without evidence of bleeding.  RESULTS: 1. Hemodynamic data:     a.     Aortic pressure 127/73.     b.     Left ventricular pressure 136/25. 2. Left ventriculography:  LV cavity size and systolic function are     normal.  EF is 70%. 3. Coronary angiography.     a.     Left main coronary:  Widely patent.  LAD and left main are      calcified.     b.     Left anterior descending coronary:  LAD is an extremely      large vessel that wraps around the left ventricular apex.  It      gives origin to a very large in distribution first diagonal.      There is a moderate size to large septal perforator.  There is      complex  trifurcation disease involving these three vessels with      LAD containing 99% stenosis with TIMI grade 2 flow.  The large      diagonal contains at least 90% stenosis.  The septal perforator is      80-90% obstructed.  The LAD is otherwise relatively normal      although there is 80% stenosis beyond the diagonal origin in the      LAD.     c.     Circumflex artery:  Circumflex coronary artery is large and      free of any significant obstruction.  There is 20% ostial      narrowing.     d.     Right coronary:  The right coronary artery contains proximal      70% narrowing and eccentric mid 60-70% narrowing.  The inferior      interventricular groove was supplied by a dual PDA supply.  CONCLUSION: 1. The left anterior descending territory is a source of  the patient's     unstable angina.  The trifurcation on the lesion in the LAD     diagonal and first septal perforator is the culprit lesion.  PCI on     this site would be high risk for ischemic complications.  The major     concern would be septal infarction with septal perforator closure  occurs.  The down side about treatment would be the high likelihood     that multiple layers of stent will be placed in the LAD to keep the     diagonal patent.  The right coronary has significant tandem     disease.  Circumflex is normal. 2. Normal LV function.  PLAN:  We will start Integrilin and resume IV heparin.  Start aspirin despite the patient's complaint that he is "allergic".  TCTS consultation to consider a LIMA to the LAD, SVG to the diagonal and bypass grafting of the right coronary.  The patient's consideration of this therapy will be difficult because of his anxiety.     Lyn Records, M.D.     HWS/MEDQ  D:  01/17/2011  T:  01/18/2011  Job:  [id number]  cc:   Willaim Sheng. Chilton Si, M.D.  Electronically Signed by Verdis Prime M.D. on 02/13/2011 09:28:55 AM

## 2011-02-21 ENCOUNTER — Ambulatory Visit: Payer: BC Managed Care – PPO

## 2011-03-07 ENCOUNTER — Other Ambulatory Visit: Payer: Self-pay | Admitting: Thoracic Surgery (Cardiothoracic Vascular Surgery)

## 2011-03-07 DIAGNOSIS — I2510 Atherosclerotic heart disease of native coronary artery without angina pectoris: Secondary | ICD-10-CM

## 2011-03-10 ENCOUNTER — Ambulatory Visit
Admission: RE | Admit: 2011-03-10 | Discharge: 2011-03-10 | Disposition: A | Discharge status: Home / Self Care | Payer: BC Managed Care – PPO | Source: Ambulatory Visit | Attending: Thoracic Surgery (Cardiothoracic Vascular Surgery) | Admitting: Thoracic Surgery (Cardiothoracic Vascular Surgery)

## 2011-03-10 ENCOUNTER — Encounter (INDEPENDENT_AMBULATORY_CARE_PROVIDER_SITE_OTHER): Payer: Self-pay | Admitting: Thoracic Surgery (Cardiothoracic Vascular Surgery)

## 2011-03-10 DIAGNOSIS — I2510 Atherosclerotic heart disease of native coronary artery without angina pectoris: Secondary | ICD-10-CM

## 2011-03-11 NOTE — Assessment & Plan Note (Signed)
OFFICE VISIT  Alexander, [name] DOB:  March 24, 1936                                        March 10, 2011 CHART #:  [id number]  The patient is a 75 year old gentleman who works as a Medical illustrator at [employer].  He presented with a non-Q-wave MI back in February.  He underwent coronary artery bypass grafting as well as a left-sided maze procedure on February 13.  His postoperative course was complicated by volume retention, persistent atrial fibrillation, and small pulmonary emboli.  He was treated with Coumadin for that and he ultimately was discharged home.  The patient returns today.  He just saw Dr. Katrinka Blazing last week.  He says he still having some intermittent pain.  When he takes a deep breath, he will have some discomfort in his chest.  Also with certain activities, he will feel some pulling on the chest, but overall he is doing well from his pain standpoint.  He has not had any recurrent anginal symptoms.  His breathing has been stable.  He says he has a knot at the right knee where the vein was harvested which is not painful and has been stable in size.  CURRENT MEDICATIONS: 1. Sotalol 80 mg b.i.d. 2. Warfarin 5 mg daily. 3. Lasix 40 mg daily. 4. Crestor 10 mg daily. 5. Potassium 20 mEq daily. 6. He is taking Tylenol p.r.n. for pain.  He has allergies to PENICILLIN and HEPARIN.  PHYSICAL EXAMINATION:  GENERAL:  The patient is a 75 year old gentleman in no acute distress.  VITAL SIGNS:  Blood pressure 115/77, pulse is 80, respirations are 20, and oxygen saturation 96% on room air.  LUNGS: Clear with equal breath sounds bilaterally.  CARDIAC:  Regular rate and rhythm.  Normal S1 and S2.  No rubs, murmurs, or gallops.  CHEST: Sternum is stable.  Sternal incision is clean, dry, and intact. EXTREMITIES:  His leg incision is well healed.  There is a small seroma at the right knee.  It is nontender with no erythema or induration.  Chest x-ray shows good  aeration of the lungs bilaterally.  There is no effusion or infiltrates.  IMPRESSION:  The patient is doing well at this point in time.  He is maintaining sinus rhythm on sotalol.  He remains on Coumadin but for the atrial fibrillation as well as the small pulmonary emboli, his INR is being followed by Dr. Katrinka Blazing.  From a surgical standpoint, he is doing well.  He is about 6 weeks out.  There are no strict restrictions on his waist, but I advised him to be gradual in how he builds up on his upper body activities as he still would likely have a considerable degree of soreness and pain.  He may drive, appropriate precautions were discussed.  He had questions about being on Lasix.  Dr. Katrinka Blazing had started him on that last week as he has had some swelling.  His weights have been pretty stable at 175-176 over the past 3-4 days.  I have recommended he continue with the Lasix until that is completed.  He has got a followup appointment with Dr. Katrinka Blazing in about 3 weeks and then they can assess at that time whether he needs to be on a chronic dose or whether is just short-term surgery related.  From a surgical standpoint, he is doing very well  at this point in time.  I think he should continue to recover well, should be able to resume full activities within another month or two.  Salvatore Decent Dorris Fetch, M.D. Electronically Signed  SCH/MEDQ  D:  03/10/2011  T:  03/11/2011  Job:  [id number]  cc:   Lyn Records, M.D. Lenon Curt Chilton Si, M.D.

## 2011-03-24 ENCOUNTER — Encounter (HOSPITAL_COMMUNITY): Payer: BC Managed Care – PPO | Attending: Interventional Cardiology

## 2011-03-24 DIAGNOSIS — I214 Non-ST elevation (NSTEMI) myocardial infarction: Secondary | ICD-10-CM | POA: Insufficient documentation

## 2011-03-24 DIAGNOSIS — J9600 Acute respiratory failure, unspecified whether with hypoxia or hypercapnia: Secondary | ICD-10-CM | POA: Insufficient documentation

## 2011-03-24 DIAGNOSIS — G4733 Obstructive sleep apnea (adult) (pediatric): Secondary | ICD-10-CM | POA: Insufficient documentation

## 2011-03-24 DIAGNOSIS — I4891 Unspecified atrial fibrillation: Secondary | ICD-10-CM | POA: Insufficient documentation

## 2011-03-24 DIAGNOSIS — Z951 Presence of aortocoronary bypass graft: Secondary | ICD-10-CM | POA: Insufficient documentation

## 2011-03-24 DIAGNOSIS — Z5189 Encounter for other specified aftercare: Secondary | ICD-10-CM | POA: Insufficient documentation

## 2011-03-24 DIAGNOSIS — E785 Hyperlipidemia, unspecified: Secondary | ICD-10-CM | POA: Insufficient documentation

## 2011-03-24 DIAGNOSIS — F411 Generalized anxiety disorder: Secondary | ICD-10-CM | POA: Insufficient documentation

## 2011-03-24 DIAGNOSIS — I2510 Atherosclerotic heart disease of native coronary artery without angina pectoris: Secondary | ICD-10-CM | POA: Insufficient documentation

## 2011-03-24 DIAGNOSIS — I2699 Other pulmonary embolism without acute cor pulmonale: Secondary | ICD-10-CM | POA: Insufficient documentation

## 2011-03-24 LAB — COMPREHENSIVE METABOLIC PANEL
ALT: 21 U/L (ref 0–53)
AST: 25 U/L (ref 0–37)
Alkaline Phosphatase: 64 U/L (ref 39–117)
CO2: 31 mEq/L (ref 19–32)
Chloride: 99 mEq/L (ref 96–112)
Creatinine, Ser: 1.2 mg/dL (ref 0.4–1.5)
GFR calc Af Amer: >60 mL/min (ref >60)
GFR calc non Af Amer: 60 mL/min — ABNORMAL LOW (ref >60)
Potassium: 4.2 mEq/L (ref 3.5–5.1)
Sodium: 138 mEq/L (ref 135–145)
Total Bilirubin: 1.1 mg/dL (ref 0.3–1.2)

## 2011-03-24 LAB — URINALYSIS, ROUTINE W REFLEX MICROSCOPIC
Bilirubin Urine: NEGATIVE
Ketones, ur: 15 mg/dL — AB
Nitrite: NEGATIVE
Protein, ur: NEGATIVE mg/dL

## 2011-03-24 LAB — CBC
MCV: 92.9 fL (ref 78.0–100.0)
RBC: 5.35 MIL/uL (ref 4.22–5.81)
WBC: 13.4 10*3/uL — ABNORMAL HIGH (ref 4.0–10.5)

## 2011-03-24 LAB — POCT I-STAT, CHEM 8
BUN: 22 mg/dL (ref 6–23)
Calcium, Ion: 1.15 mmol/L (ref 1.12–1.32)
Glucose, Bld: 138 mg/dL — ABNORMAL HIGH (ref 70–99)
HCT: 52 % (ref 39.0–52.0)
TCO2: 29 mmol/L (ref 0–100)

## 2011-03-24 LAB — DIFFERENTIAL
Basophils Absolute: 0 10*3/uL (ref 0.0–0.1)
Basophils Relative: 0 % (ref 0–1)
Eosinophils Absolute: 0 10*3/uL (ref 0.0–0.7)
Eosinophils Relative: 0 % (ref 0–5)

## 2011-03-24 LAB — HEPATIC FUNCTION PANEL
Alkaline Phosphatase: 60 U/L (ref 39–117)
Bilirubin, Direct: 0.1 mg/dL (ref 0.0–0.3)
Indirect Bilirubin: 1 mg/dL — ABNORMAL HIGH (ref 0.3–0.9)
Total Protein: 7.6 g/dL (ref 6.0–8.3)

## 2011-03-24 LAB — LIPASE, BLOOD: Lipase: 16 U/L (ref 11–59)

## 2011-03-26 ENCOUNTER — Encounter (HOSPITAL_COMMUNITY): Payer: BC Managed Care – PPO

## 2011-03-28 ENCOUNTER — Encounter (HOSPITAL_COMMUNITY): Payer: BC Managed Care – PPO

## 2011-03-31 ENCOUNTER — Encounter (HOSPITAL_COMMUNITY): Payer: BC Managed Care – PPO

## 2011-04-02 ENCOUNTER — Encounter (HOSPITAL_COMMUNITY): Payer: BC Managed Care – PPO

## 2011-04-04 ENCOUNTER — Encounter (HOSPITAL_COMMUNITY): Payer: BC Managed Care – PPO

## 2011-04-07 ENCOUNTER — Encounter (HOSPITAL_COMMUNITY): Payer: BC Managed Care – PPO

## 2011-04-09 ENCOUNTER — Encounter (HOSPITAL_COMMUNITY): Payer: BC Managed Care – PPO | Attending: Interventional Cardiology

## 2011-04-09 DIAGNOSIS — G4733 Obstructive sleep apnea (adult) (pediatric): Secondary | ICD-10-CM | POA: Insufficient documentation

## 2011-04-09 DIAGNOSIS — I214 Non-ST elevation (NSTEMI) myocardial infarction: Secondary | ICD-10-CM | POA: Insufficient documentation

## 2011-04-09 DIAGNOSIS — I2510 Atherosclerotic heart disease of native coronary artery without angina pectoris: Secondary | ICD-10-CM | POA: Insufficient documentation

## 2011-04-09 DIAGNOSIS — I4891 Unspecified atrial fibrillation: Secondary | ICD-10-CM | POA: Insufficient documentation

## 2011-04-09 DIAGNOSIS — E785 Hyperlipidemia, unspecified: Secondary | ICD-10-CM | POA: Insufficient documentation

## 2011-04-09 DIAGNOSIS — J9600 Acute respiratory failure, unspecified whether with hypoxia or hypercapnia: Secondary | ICD-10-CM | POA: Insufficient documentation

## 2011-04-09 DIAGNOSIS — F411 Generalized anxiety disorder: Secondary | ICD-10-CM | POA: Insufficient documentation

## 2011-04-09 DIAGNOSIS — I2699 Other pulmonary embolism without acute cor pulmonale: Secondary | ICD-10-CM | POA: Insufficient documentation

## 2011-04-09 DIAGNOSIS — Z5189 Encounter for other specified aftercare: Secondary | ICD-10-CM | POA: Insufficient documentation

## 2011-04-09 DIAGNOSIS — Z951 Presence of aortocoronary bypass graft: Secondary | ICD-10-CM | POA: Insufficient documentation

## 2011-04-10 ENCOUNTER — Ambulatory Visit
Admission: RE | Admit: 2011-04-10 | Discharge: 2011-04-10 | Disposition: A | Discharge status: Home / Self Care | Payer: BC Managed Care – PPO | Source: Ambulatory Visit | Attending: Internal Medicine | Admitting: Internal Medicine

## 2011-04-10 ENCOUNTER — Emergency Department (HOSPITAL_COMMUNITY)
Admission: EM | Admit: 2011-04-10 | Discharge: 2011-04-10 | Disposition: A | Discharge status: Home / Self Care | Payer: BC Managed Care – PPO | Attending: Emergency Medicine | Admitting: Emergency Medicine

## 2011-04-10 ENCOUNTER — Emergency Department (HOSPITAL_COMMUNITY): Payer: BC Managed Care – PPO

## 2011-04-10 ENCOUNTER — Other Ambulatory Visit: Payer: Self-pay | Admitting: Internal Medicine

## 2011-04-10 DIAGNOSIS — Z79899 Other long term (current) drug therapy: Secondary | ICD-10-CM | POA: Insufficient documentation

## 2011-04-10 DIAGNOSIS — Z951 Presence of aortocoronary bypass graft: Secondary | ICD-10-CM | POA: Insufficient documentation

## 2011-04-10 DIAGNOSIS — K8010 Calculus of gallbladder with chronic cholecystitis without obstruction: Secondary | ICD-10-CM | POA: Insufficient documentation

## 2011-04-10 DIAGNOSIS — R1011 Right upper quadrant pain: Secondary | ICD-10-CM | POA: Insufficient documentation

## 2011-04-10 DIAGNOSIS — Z86718 Personal history of other venous thrombosis and embolism: Secondary | ICD-10-CM | POA: Insufficient documentation

## 2011-04-10 DIAGNOSIS — Z7901 Long term (current) use of anticoagulants: Secondary | ICD-10-CM | POA: Insufficient documentation

## 2011-04-10 DIAGNOSIS — I4891 Unspecified atrial fibrillation: Secondary | ICD-10-CM | POA: Insufficient documentation

## 2011-04-10 DIAGNOSIS — I2510 Atherosclerotic heart disease of native coronary artery without angina pectoris: Secondary | ICD-10-CM | POA: Insufficient documentation

## 2011-04-10 LAB — BASIC METABOLIC PANEL
CO2: 28 mEq/L (ref 19–32)
Calcium: 9.5 mg/dL (ref 8.4–10.5)
Creatinine, Ser: 0.77 mg/dL (ref 0.4–1.5)
GFR calc Af Amer: >60 mL/min (ref >60)
GFR calc non Af Amer: >60 mL/min (ref >60)
Sodium: 136 mEq/L (ref 135–145)

## 2011-04-10 LAB — LIPASE, BLOOD: Lipase: 25 U/L (ref 11–59)

## 2011-04-10 LAB — DIFFERENTIAL
Basophils Absolute: 0 10*3/uL (ref 0.0–0.1)
Basophils Relative: 0 % (ref 0–1)
Eosinophils Relative: 1 % (ref 0–5)
Lymphocytes Relative: 23 % (ref 12–46)
Monocytes Absolute: 0.6 10*3/uL (ref 0.1–1.0)

## 2011-04-10 LAB — CBC
HCT: 38.1 % — ABNORMAL LOW (ref 39.0–52.0)
MCHC: 33.1 g/dL (ref 30.0–36.0)
Platelets: 286 10*3/uL (ref 150–400)
RDW: 15.2 % (ref 11.5–15.5)
WBC: 9.5 10*3/uL (ref 4.0–10.5)

## 2011-04-10 LAB — URINALYSIS, ROUTINE W REFLEX MICROSCOPIC
Hgb urine dipstick: NEGATIVE
Nitrite: NEGATIVE
Protein, ur: NEGATIVE mg/dL
Specific Gravity, Urine: 1.023 (ref 1.005–1.030)
Urobilinogen, UA: 0.2 mg/dL (ref 0.0–1.0)

## 2011-04-10 LAB — HEPATIC FUNCTION PANEL
Albumin: 3.5 g/dL (ref 3.5–5.2)
Alkaline Phosphatase: 101 U/L (ref 39–117)
Indirect Bilirubin: 0.8 mg/dL (ref 0.3–0.9)
Total Protein: 6.7 g/dL (ref 6.0–8.3)

## 2011-04-10 MED ORDER — IOHEXOL 300 MG/ML  SOLN: 100.0000 mL | Freq: Once | INTRAMUSCULAR | Status: DC | PRN

## 2011-04-11 ENCOUNTER — Encounter (HOSPITAL_COMMUNITY): Payer: BC Managed Care – PPO

## 2011-04-14 ENCOUNTER — Encounter (HOSPITAL_COMMUNITY): Payer: BC Managed Care – PPO

## 2011-04-14 NOTE — Discharge Summary (Signed)
NAMEJISHNU, JENNIGES               ACCOUNT NO.:  [id number]  MEDICAL RECORD NO.:  [id number]           PATIENT TYPE:  I  LOCATION:  2016                         FACILITY:  [facility]  PHYSICIAN:  Salvatore Decent. Dorris Fetch, M.D.DATE OF BIRTH:  09-22-1936  DATE OF ADMISSION:  01/17/2011 DATE OF DISCHARGE:  02/14/2011                              DISCHARGE SUMMARY   HISTORY:  The patient is a 75 year old white male who presented from the [facility] Emergency Department with acute coronary syndrome. Troponins were slightly elevated.  The patient was started on a heparin drip and transferred to [facility] for further evaluation and management.  The patient presented with chest pain and bilateral arm pain the day prior to transfer.  He was at work at [employer] and developed the chest discomfort associated with shortness of breath and bilateral arm pain that lasted approximately 2 hours.  It waxed and waned a little but never quite went away.  The patient has a history of angina for approximately 10 years but this was much more severe and more prolonged than previous episodes.  He has a history of previous Cardiolites in the past that were negative.  The most recent one was 3 years ago.  He has a history of paroxysmal atrial fibrillation and will have episodes as frequently as every 2 weeks and occasionally up to every 3 months.  His heart raises and would resolve spontaneously approximately 8-9 hours later.  He was felt to require further evaluation treatment to include cardiac catheterization.  PAST MEDICAL HISTORY: 1. Paroxysmal atrial fibrillation. 2. Obstructive sleep apnea. 3. Hyperlipidemia. 4. Exercise-induced asthma. 5. Anxiety.  MEDICATIONS PRIOR TO ADMISSION: 1. Sulindac 200 mg p.o. daily. 2. Flaxseed oil 1000 mg daily. 3. Centrum multivitamin 1 tablet daily. 4. Albuterol p.r.n. with rare use. 5. Amitriptyline 25 mg tablets 1/4th of a tablet nightly. 6. Xanax 0.25 mg  nightly. 7. Diazepam 5 mg p.o. nightly.  ALLERGIES:  ASPIRIN which causes GI upset and is only an intolerance. He is also intolerant to PLAVIX which he relates to be exacerbating his atrial fibrillation.  This is not confirmed for sure as an allergy.  He also has a previous intolerance to COUMADIN with reaction not listed.  FAMILY HISTORY:  Significant for atrial fibrillation in his brother. Neither parent has significant cardiac disease.  SOCIAL HISTORY:  He is active.  He works at an [employer] full- time.  He lives with his wife.  He has a remote history of tobacco use but quit when he was 75 years old.  REVIEW OF SYSTEMS AND PHYSICAL EXAM:  Please see the history and physical done at the time of admission.  HOSPITAL COURSE:  The patient was admitted and transferred from [facility] as described.  He subsequently underwent cardiac catheterization and this revealed severe two-vessel disease with complex LAD disease. He was referred to Charlett Lango MD who evaluated the patient andhis studies and agreed with his best option for surgical revascularization to proceed with coronary artery bypass grafting.  PROCEDURE: 1. Aortocoronary bypass grafting x3 on January 20, 2011, by Dr.     Dorris Fetch.  The following grafts were placed. 2. Left internal mammary artery to the LAD. 3. Saphenous vein graft to the first diagonal. 4. Saphenous vein graft to the distal right coronary artery.  He     tolerated the procedure well, was taken to the surgical intensive     care unit in stable condition.  POSTOPERATIVE HOSPITAL COURSE:  The patient had a prolonged postoperative course primarily to multiple factors including paroxysmal atrial fibrillation.  Additionally, he had a postoperative pneumonia. He had postoperative heparin-induced thrombocytopenia as well as a pulmonary embolism.  Additionally, he had postoperative C. difficile colitis.  Postoperative thrush.  Postoperative  acute blood loss anemia. Postoperative volume overload.  He required aggressive management including time in the intensive care unit to recover from these issues. He was treated with Refludan for heparin-induced thrombocytopenia and pulmonary embolism.  In addition to the above-mentioned surgical revascularization, he also had a left-sided maze procedure and ligation of the left atrial appendage at the time of the surgery.  He progressed over time and ultimately was deemed to be acceptable for discharge on February 14, 2011.  MEDICATIONS ON DISCHARGE: 1. Aspirin 81 mg daily. 2. Lasix 40 mg daily for 7 days. 3. Hydrocodone/APAP 5/325 mg 1-2 every 4 hours p.r.n. for pain. 4. Metoprolol 25 mg b.i.d. 5. Potassium chloride 20 mEq daily for 7 days. 6. Crestor 10 mg daily. 7. Albuterol nebulizer 1 puff p.r.n. 8. Amitriptyline 25 mg one-quarter tablet nightly. 9. Centrum 1 tablet daily. 10.Diazepam 5 mg daily at bedtime. 11.Flaxseed 1000 mg daily. 12.Preparation H p.r.n. 13.Sulindac 200 mg 2/3rds of a tablet q.a.m. 14.Xanax 0.25 mg tablet daily.  INSTRUCTIONS:  The patient received written instructions regarding medications, activity, diet, wound care and followup.  Followup to include want him to see Dr. Dorris Fetch in 3 weeks postdischarge.  Also Dr. Katrinka Blazing 2 weeks post discharge.  FINAL DIAGNOSES:  Severe two-vessel coronary disease with revascularization as described status post coronary artery bypass grafting and left-sided maze for a history of paroxysmal atrial fibrillation, postoperative atrial fibrillation, postoperative pulmonary embolism, postoperative pneumonia, postoperative C. difficile colitis, postoperative thrush, postoperative acute blood loss anemia, postoperative volume overload, history of obstructive sleep apnea, history of hyperlipidemia, history of exercise-induced asthma, history of anxiety.  CONDITION ON DISCHARGE:  Stable, improved.     Rowe Clack,  P.A.-C.   ______________________________ Salvatore Decent Dorris Fetch, M.D.    Sherryll Burger  D:  03/11/2011  T:  03/12/2011  Job:  [id number]  cc:   Salvatore Decent. Dorris Fetch, M.D. Verdis Prime  Electronically Signed by Deniece Portela GOLD P.A.-C. on 03/25/2011 02:20:13 PM Electronically Signed by Charlett Lango M.D. on 04/14/2011 02:27:50 PM

## 2011-04-16 ENCOUNTER — Encounter (HOSPITAL_COMMUNITY): Payer: BC Managed Care – PPO

## 2011-04-18 ENCOUNTER — Encounter (HOSPITAL_COMMUNITY): Payer: BC Managed Care – PPO

## 2011-04-21 ENCOUNTER — Encounter (HOSPITAL_COMMUNITY): Payer: BC Managed Care – PPO

## 2011-04-22 NOTE — Op Note (Signed)
NAMECHRISTON, Alexander [name]               ACCOUNT NO.:  [id number]   MEDICAL RECORD NO.:  [id number]          PATIENT TYPE:  AMB   LOCATION:  DAY                          FACILITY:  [facility]   PHYSICIAN:  Adolph Pollack, M.D.DATE OF BIRTH:  06-03-1936   DATE OF PROCEDURE:  11/06/2008  DATE OF DISCHARGE:                               OPERATIVE REPORT   PREOPERATIVE DIAGNOSIS:  Bilateral inguinal hernias.   POSTOPERATIVE DIAGNOSIS:  Bilateral inguinal hernias.   PROCEDURE:  Laparoscopic bilateral inguinal hernia repair with mesh.   SURGEON:  Adolph Pollack, M.D.   ANESTHESIA:  General.   INDICATIONS:  This 75 year old male has been having some left groin and  lower quadrant pain and swelling.  He has a left inguinal hernia on  exam.  He also underwent a CT scan at the [place] demonstrating a right  inguinal hernia containing extraperitoneal fat and now presents for  laparoscopic bilateral inguinal hernia repair.  Procedure, risks and  aftercare been discussed with him preoperatively.   TECHNIQUE:  He voided and was brought to the operating room, placed  supine on the operating table and general anesthetic was administered.  The hair in the groin and lower abdominal areas was clipped and the area  was sterilely prepped and draped.  Marcaine solution was infiltrated in  the subumbilical region.  A small transverse subumbilical incision was  made through the skin and subcutaneous tissue.  Using blunt dissection,  the left anterior rectus sheath was identified and small incision made  in it.  The underlying left rectus muscle was swept laterally exposing  the posterior rectus sheath.  A balloon dissection device was then  placed into the extraperitoneal space and under laparoscopic vision,  balloon dissection was slowly performed.   The balloon was removed and CO2 gas was insufflated.  The laparoscope  was introduced and two 5 mm trocars were placed in the lower midline.   Using blunt  dissection, I identified the symphysis pubis and Cooper's  ligament bilaterally.  I then began on the left side, looked at the  direct space which was solid.  I dissected the fibrofatty tissue free  from the anterior and lateral abdominal wall to the level of the  umbilicus.  I identified the spermatic cord and noted an indirect sac  with it.  I isolated the spermatic cord and made a window around it.  I  then carefully stripped the hernia sac free down the spermatic cord back  to level of the umbilicus.   Subsequently, I approached the right side and used blunt dissection to  dissect fibrofatty tissue away from the anterior and lateral abdominal  wall.  The direct space was solid.  I isolated the spermatic cord and  noted some extraperitoneal fat in the hernia sac.  I made a window  around the right spermatic cord and dissected the sac and contents back  to the level of the umbilicus.   Following this, a piece of 5 inch x 6 inch Parietex mesh was brought  into the field with a partial longitudinal slit cut into it.  It was  placed in the left extraperitoneal space with two tails made to wrap  around the spermatic cord.  The mesh was then anchored to Cooper's  ligament, the anterior lateral abdominal walls with spiral tacks.   A similar piece of mesh with a longitudinal slit cut into it was then  placed in the right extraperitoneal space and positioned appropriately.  It was anchored to Cooper's ligament, the anterior and lateral abdominal  wall with spiral tacks.   Inspection of the both repairs demonstrated that the mesh more than  adequately covered the indirect defect with good overlap.   Following this, no bleeding was noted.  I then released the CO2 gas and  watched the extraperitoneal contents approximate the mesh.  I  subsequently removed all the trocars.   The anterior rectus sheath defect in the left side was closed with  interrupted zero Vicryl sutures.  All skin  incisions were closed with 4-  0 Monocryl subcuticular stitches followed by Steri-Strips and sterile  dressings.   He tolerated the procedure well without any apparent complications and  was taken to the recovery in satisfactory condition.      Adolph Pollack, M.D.  Electronically Signed     TJR/MEDQ  D:  11/06/2008  T:  11/07/2008  Job:  [id number]   cc:   Lenon Curt. Chilton Si, M.D.  Fax: [phone]

## 2011-04-23 ENCOUNTER — Encounter (HOSPITAL_COMMUNITY): Payer: BC Managed Care – PPO

## 2011-04-25 ENCOUNTER — Encounter (HOSPITAL_COMMUNITY): Payer: BC Managed Care – PPO

## 2011-04-28 ENCOUNTER — Encounter (HOSPITAL_COMMUNITY): Payer: BC Managed Care – PPO

## 2011-04-30 ENCOUNTER — Encounter (HOSPITAL_COMMUNITY): Payer: BC Managed Care – PPO

## 2011-05-02 ENCOUNTER — Encounter (HOSPITAL_COMMUNITY): Payer: BC Managed Care – PPO

## 2011-05-05 ENCOUNTER — Encounter (HOSPITAL_COMMUNITY): Payer: BC Managed Care – PPO

## 2011-05-07 ENCOUNTER — Encounter (HOSPITAL_COMMUNITY): Payer: BC Managed Care – PPO

## 2011-05-09 ENCOUNTER — Encounter (HOSPITAL_COMMUNITY): Payer: BC Managed Care – PPO

## 2011-05-12 ENCOUNTER — Encounter (HOSPITAL_COMMUNITY): Payer: BC Managed Care – PPO

## 2011-05-14 ENCOUNTER — Encounter (HOSPITAL_COMMUNITY): Payer: BC Managed Care – PPO

## 2011-05-16 ENCOUNTER — Encounter (HOSPITAL_COMMUNITY): Payer: BC Managed Care – PPO

## 2011-05-19 ENCOUNTER — Encounter (HOSPITAL_COMMUNITY): Payer: BC Managed Care – PPO

## 2011-05-21 ENCOUNTER — Encounter (HOSPITAL_COMMUNITY): Payer: BC Managed Care – PPO

## 2011-05-23 ENCOUNTER — Encounter (HOSPITAL_COMMUNITY): Payer: BC Managed Care – PPO

## 2011-05-26 ENCOUNTER — Encounter (HOSPITAL_COMMUNITY): Payer: BC Managed Care – PPO

## 2011-05-28 ENCOUNTER — Encounter (HOSPITAL_COMMUNITY): Payer: BC Managed Care – PPO

## 2011-05-30 ENCOUNTER — Encounter (HOSPITAL_COMMUNITY): Payer: BC Managed Care – PPO

## 2011-06-02 ENCOUNTER — Encounter (HOSPITAL_COMMUNITY): Payer: BC Managed Care – PPO

## 2011-06-04 ENCOUNTER — Encounter (HOSPITAL_COMMUNITY): Payer: BC Managed Care – PPO

## 2011-06-06 ENCOUNTER — Encounter (HOSPITAL_COMMUNITY): Payer: BC Managed Care – PPO

## 2011-06-09 ENCOUNTER — Encounter (HOSPITAL_COMMUNITY): Payer: BC Managed Care – PPO

## 2011-06-11 ENCOUNTER — Encounter (HOSPITAL_COMMUNITY): Payer: BC Managed Care – PPO

## 2011-06-13 ENCOUNTER — Encounter (HOSPITAL_COMMUNITY): Payer: BC Managed Care – PPO

## 2011-06-16 ENCOUNTER — Encounter (HOSPITAL_COMMUNITY): Payer: BC Managed Care – PPO

## 2011-06-18 ENCOUNTER — Encounter (HOSPITAL_COMMUNITY): Payer: BC Managed Care – PPO

## 2011-06-20 ENCOUNTER — Encounter (HOSPITAL_COMMUNITY): Payer: BC Managed Care – PPO

## 2011-06-23 ENCOUNTER — Encounter (HOSPITAL_COMMUNITY): Payer: BC Managed Care – PPO

## 2011-06-25 ENCOUNTER — Encounter (HOSPITAL_COMMUNITY): Payer: BC Managed Care – PPO

## 2011-06-27 ENCOUNTER — Encounter (HOSPITAL_COMMUNITY): Payer: BC Managed Care – PPO

## 2011-07-07 ENCOUNTER — Other Ambulatory Visit: Payer: Self-pay | Admitting: Dermatology

## 2011-07-15 ENCOUNTER — Telehealth: Payer: Self-pay | Admitting: Internal Medicine

## 2011-07-15 NOTE — Telephone Encounter (Signed)
Candise Bowens, Pls call [facility] [employer] sr care [phone] I think. Basically this patient was in ICU in march 2012. AT that time PCCM Dr Molli Knock signed off but I get a note from [facility] dated 7/21 chronicle of all problems. For some reason, I am copied. No indication that I need to see patient in office. [place] also mentioned as pulmonary docs but no evidence this patient was seen in our office.   Does Dr. Chilton Si want [place] to see this patient ? If so, pls give appt. If not, no need

## 2011-07-25 ENCOUNTER — Encounter (INDEPENDENT_AMBULATORY_CARE_PROVIDER_SITE_OTHER): Payer: Self-pay | Admitting: General Surgery

## 2011-07-25 NOTE — Telephone Encounter (Signed)
ATCx1 but office was closed. WCB. Carron Curie, CMA

## 2011-07-28 ENCOUNTER — Ambulatory Visit (INDEPENDENT_AMBULATORY_CARE_PROVIDER_SITE_OTHER): Payer: BC Managed Care – PPO | Admitting: General Surgery

## 2011-08-05 NOTE — Telephone Encounter (Signed)
Spoke with nurse for Dr. Chilton Si and she states pt does not need an appt here. Carron Curie, CMA

## 2011-08-21 ENCOUNTER — Encounter (INDEPENDENT_AMBULATORY_CARE_PROVIDER_SITE_OTHER): Payer: Self-pay

## 2011-08-25 ENCOUNTER — Ambulatory Visit (INDEPENDENT_AMBULATORY_CARE_PROVIDER_SITE_OTHER): Payer: BC Managed Care – PPO | Admitting: General Surgery

## 2011-08-25 ENCOUNTER — Encounter (INDEPENDENT_AMBULATORY_CARE_PROVIDER_SITE_OTHER): Payer: Self-pay | Admitting: General Surgery

## 2011-08-25 VITALS — BP 104/74 | HR 60 | Temp 97.6°F | Ht 69.0 in | Wt 190.0 lb

## 2011-08-25 DIAGNOSIS — K8020 Calculus of gallbladder without cholecystitis without obstruction: Secondary | ICD-10-CM

## 2011-08-25 NOTE — Progress Notes (Signed)
Alexander [name] is here for followup of his symptomatic cholelithiasis. He is off his Coumadin now. He had heparin-induced thrombocytopenia. His heart is in sinus rhythm. He is slowly getting stronger. He had one episode of biliary colic 6 weeks ago but this was short and resolved spontaneously. He does not feel like she's quite strong enough for surgery at this time.  Physical exam: General-well-developed, well-nourished male, in no acute distress.   Cardiac-regular rate regular rhythm  Abdomen-soft, nontender, nondistended, small subumbilical incision present.  Assessment: Symptomatic cholelithiasis-not having frequent episodes at this time; slowly gaining his strength back. He is agitated and trying to have his operation in January 2013.  Plan: Return visit in 3 months at which time we will schedule his operation. If he has more frequent episodes, we'll need to see him sooner. He understands this.

## 2011-09-09 LAB — DIFFERENTIAL
Basophils Absolute: 0 10*3/uL (ref 0.0–0.1)
Lymphocytes Relative: 30 % (ref 12–46)
Monocytes Absolute: 0.4 10*3/uL (ref 0.1–1.0)
Neutro Abs: 3.6 10*3/uL (ref 1.7–7.7)
Neutrophils Relative %: 62 % (ref 43–77)

## 2011-09-09 LAB — COMPREHENSIVE METABOLIC PANEL
Albumin: 3.6 g/dL (ref 3.5–5.2)
BUN: 18 mg/dL (ref 6–23)
Chloride: 102 mEq/L (ref 96–112)
Creatinine, Ser: 1.12 mg/dL (ref 0.4–1.5)
Total Bilirubin: 1.2 mg/dL (ref 0.3–1.2)
Total Protein: 6.7 g/dL (ref 6.0–8.3)

## 2011-09-09 LAB — PROTIME-INR: INR: 1 (ref 0.00–1.49)

## 2011-09-09 LAB — CBC
HCT: 48.4 % (ref 39.0–52.0)
MCV: 93.7 fL (ref 78.0–100.0)
Platelets: 268 10*3/uL (ref 150–400)
RDW: 13 % (ref 11.5–15.5)

## 2011-11-12 IMAGING — CT CT ANGIO CHEST
2 of 7 series · 18 of 36 positions shown · IV contrast (CONTRAST)
Comparison: Chest x-ray of 01/28/2011

CLINICAL DATA: Short of breath, atrial fibrillation, chest pain

CT ANGIOGRAPHY CHEST WITH CONTRAST
TECHNIQUE: Multidetector CT imaging of the chest was performed
using the standard protocol during bolus administration of
intravenous contrast.  Multiplanar CT image reconstructions
including MIPs were obtained to evaluate the vascular anatomy.
Contrast:  80 ml Vmnipaque-T88

[Series 5: pe thins · axial · 0.71mm/px · z∈[-284,-21]mm · 17 of 297 slices shown]
[im 17/297  lung]
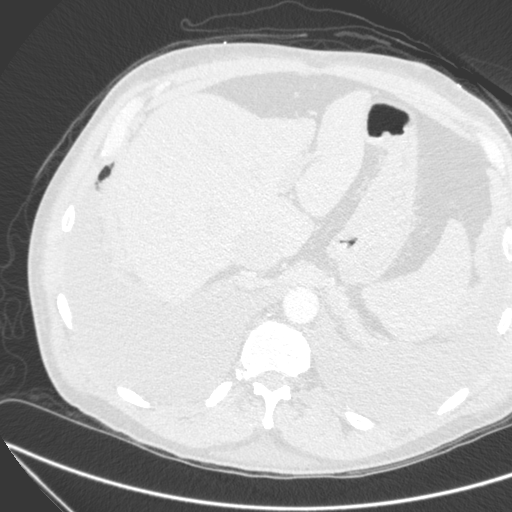
[im 33/297  mediastinal]
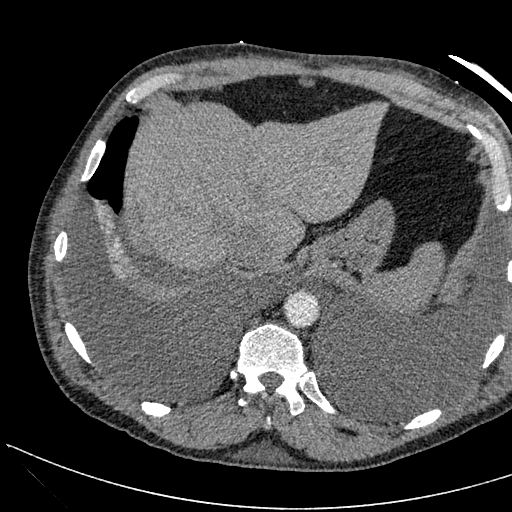
[im 50/297  lung]
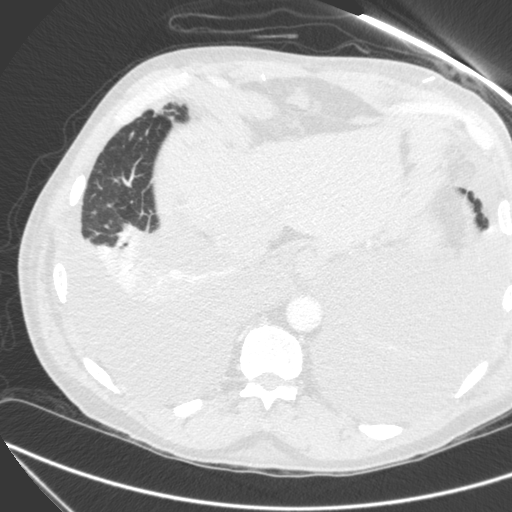
[im 66/297  mediastinal]
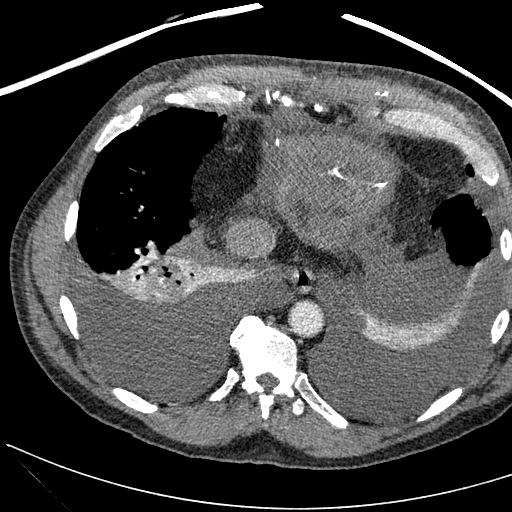
[im 83/297  lung]
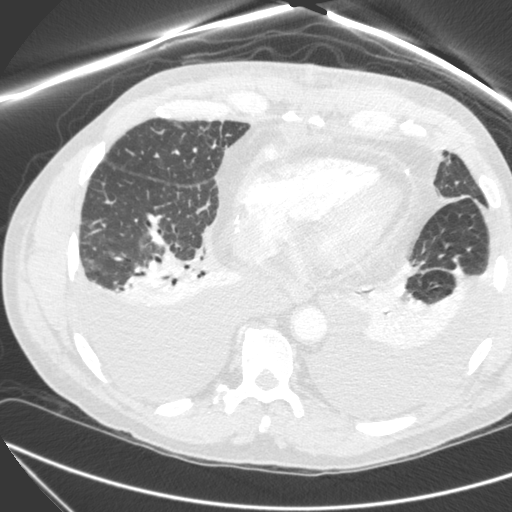
[im 99/297  mediastinal]
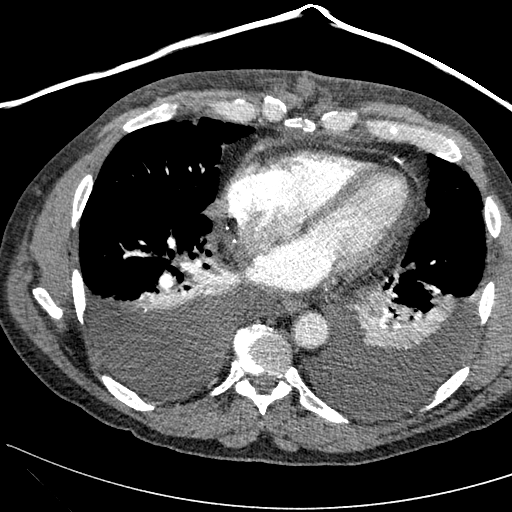
[im 116/297  lung]
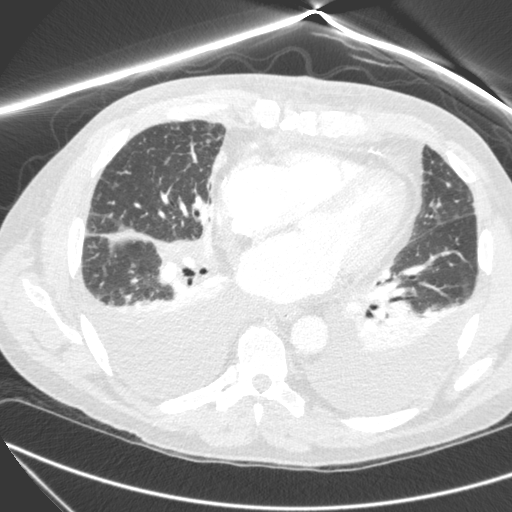
[im 132/297  mediastinal]
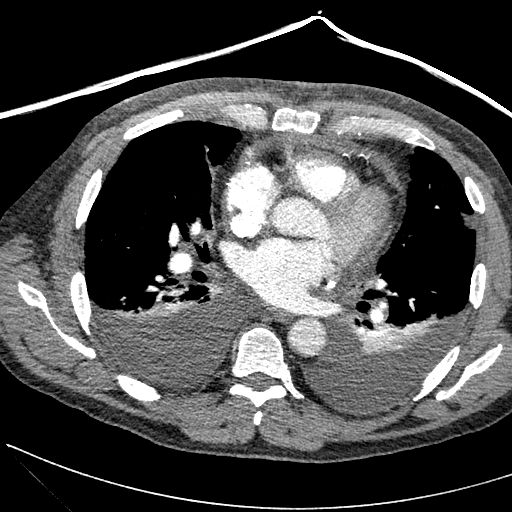
[im 149/297  lung]
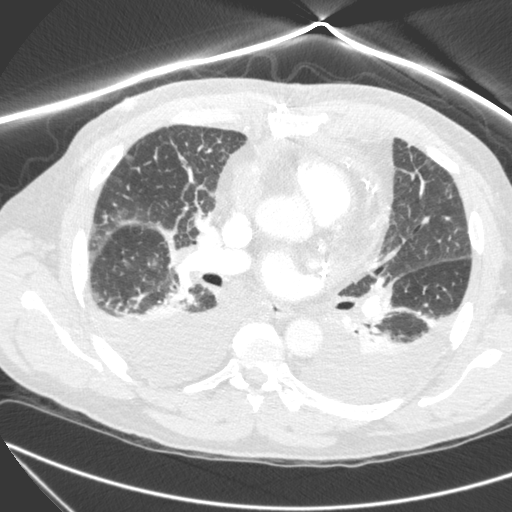
[im 165/297  mediastinal]
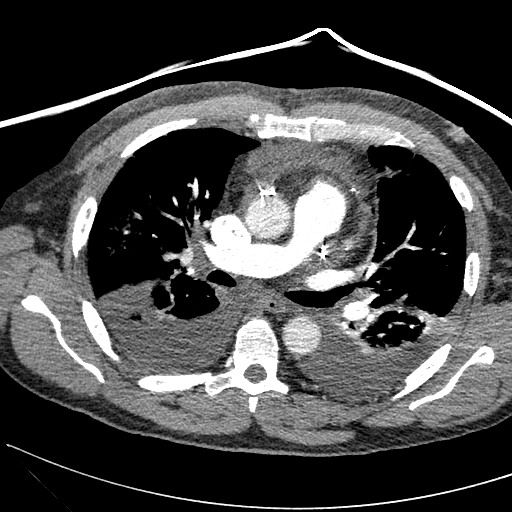
[im 181/297  lung]
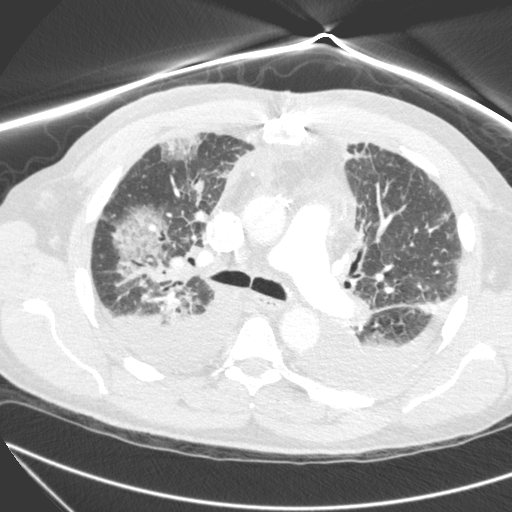
[im 198/297  mediastinal]
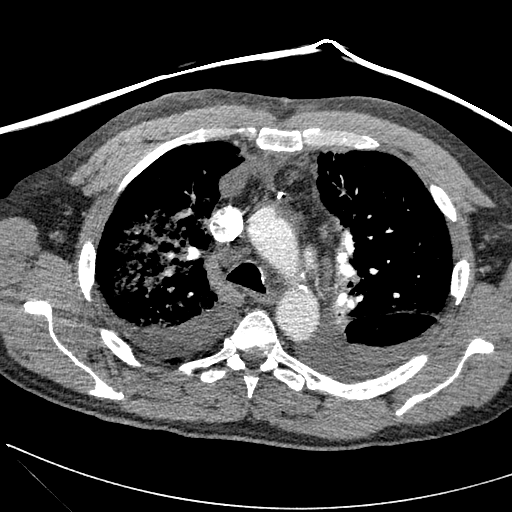
[im 214/297  lung]
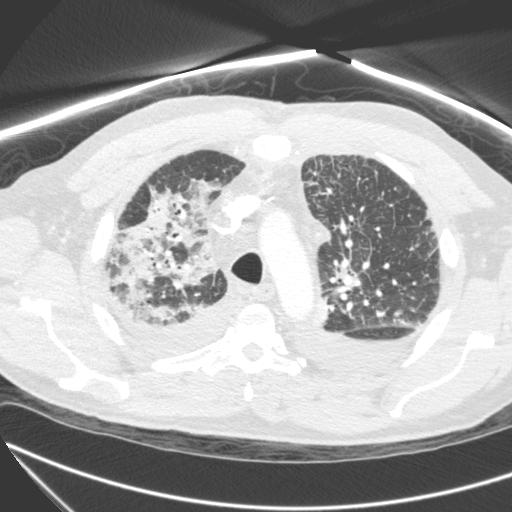
[im 231/297  mediastinal]
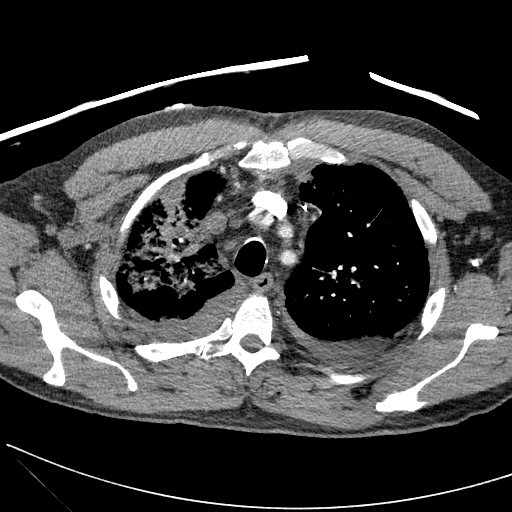
[im 247/297  lung]
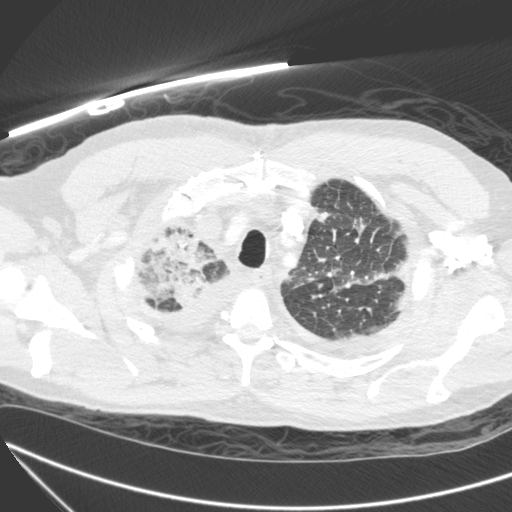
[im 264/297  mediastinal]
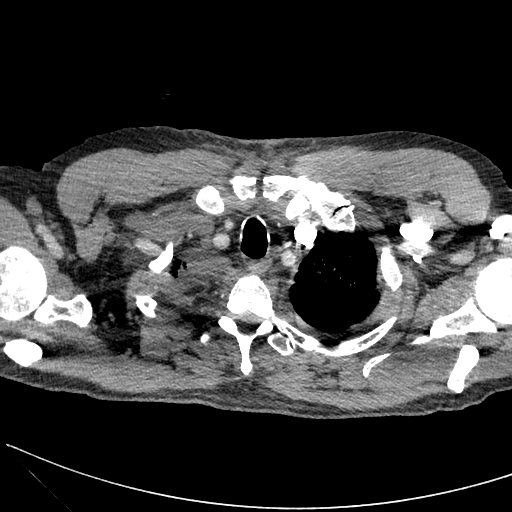
[im 280/297  lung]
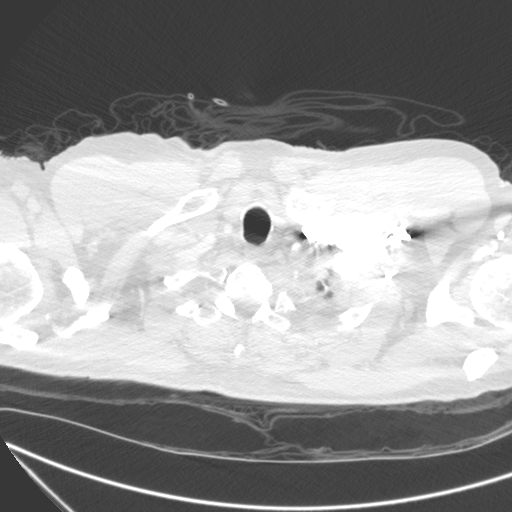

[mpr, coronals, coronal · coronal · 0.70mm/px · 1 of 134 slices shown]
[im 67/134  mediastinal]
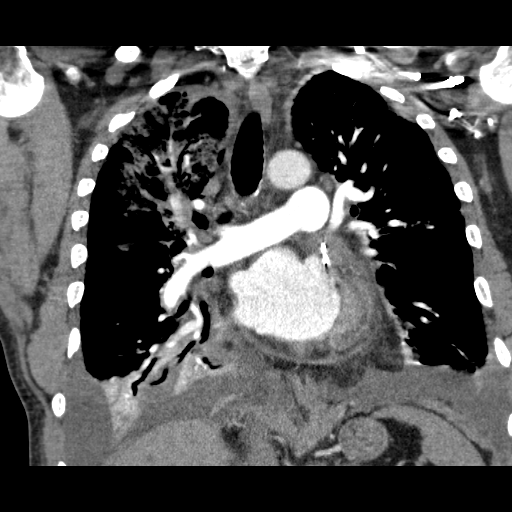

[18 of 36 positions shown; findings below may reference images not displayed]

FINDINGS: There is good opacification of the pulmonary arteries.
There are filling defects within secondary branches of the right
pulmonary artery to the right middle lobe and to the superior
segment of the right lower lobe which are non occlusive.  Small
pulmonary emboli also are noted in branches to the right upper
lobe.  No pulmonary emboli are seen on the left.  There are
moderately large bilateral pleural effusions present.  Strandiness
of the soft tissues of the anterior mediastinum is noted presumably
due to recent CABG.  The thoracic aorta opacifies with no acute
abnormality, and the origins of the great vessels appear patent.
Slightly prominent mediastinal nodes are present most likely
reactive.  The heart is mildly enlarged.  A small pericardial
effusion is noted.  There is volume loss involving both lower
lobes.

On the lung window images, there is parenchymal opacity within the
right upper lobe with air bronchograms most consistent with
pneumonia.  Minimal opacity is present in the right middle lobe as
well.  Volume loss is noted in both lower lobes.

Review of the MIP images confirms the above findings.
IMPRESSION: 1.  Nonocclusive small pulmonary emboli in branches of the right
pulmonary artery to the right upper lobe, superior segment of the
right lower lobe, and right middle lobe.
2.  Parenchymal airspace disease within the right upper lobe most
consistent with pneumonia.
3.  Moderately large bilateral pleural effusions.
4.  Atelectasis of both lower lobes.

Critical test results telephoned to [place], the patient's nurse on
a.m.

## 2011-11-15 IMAGING — CR DG CHEST 1V PORT
1 series · 1 of 1 positions shown · non-contrast
Comparison: 01/31/2011 and CTA from 01/29/2011

CLINICAL DATA: CABG.

PORTABLE CHEST - 1 VIEW

[view not recorded]
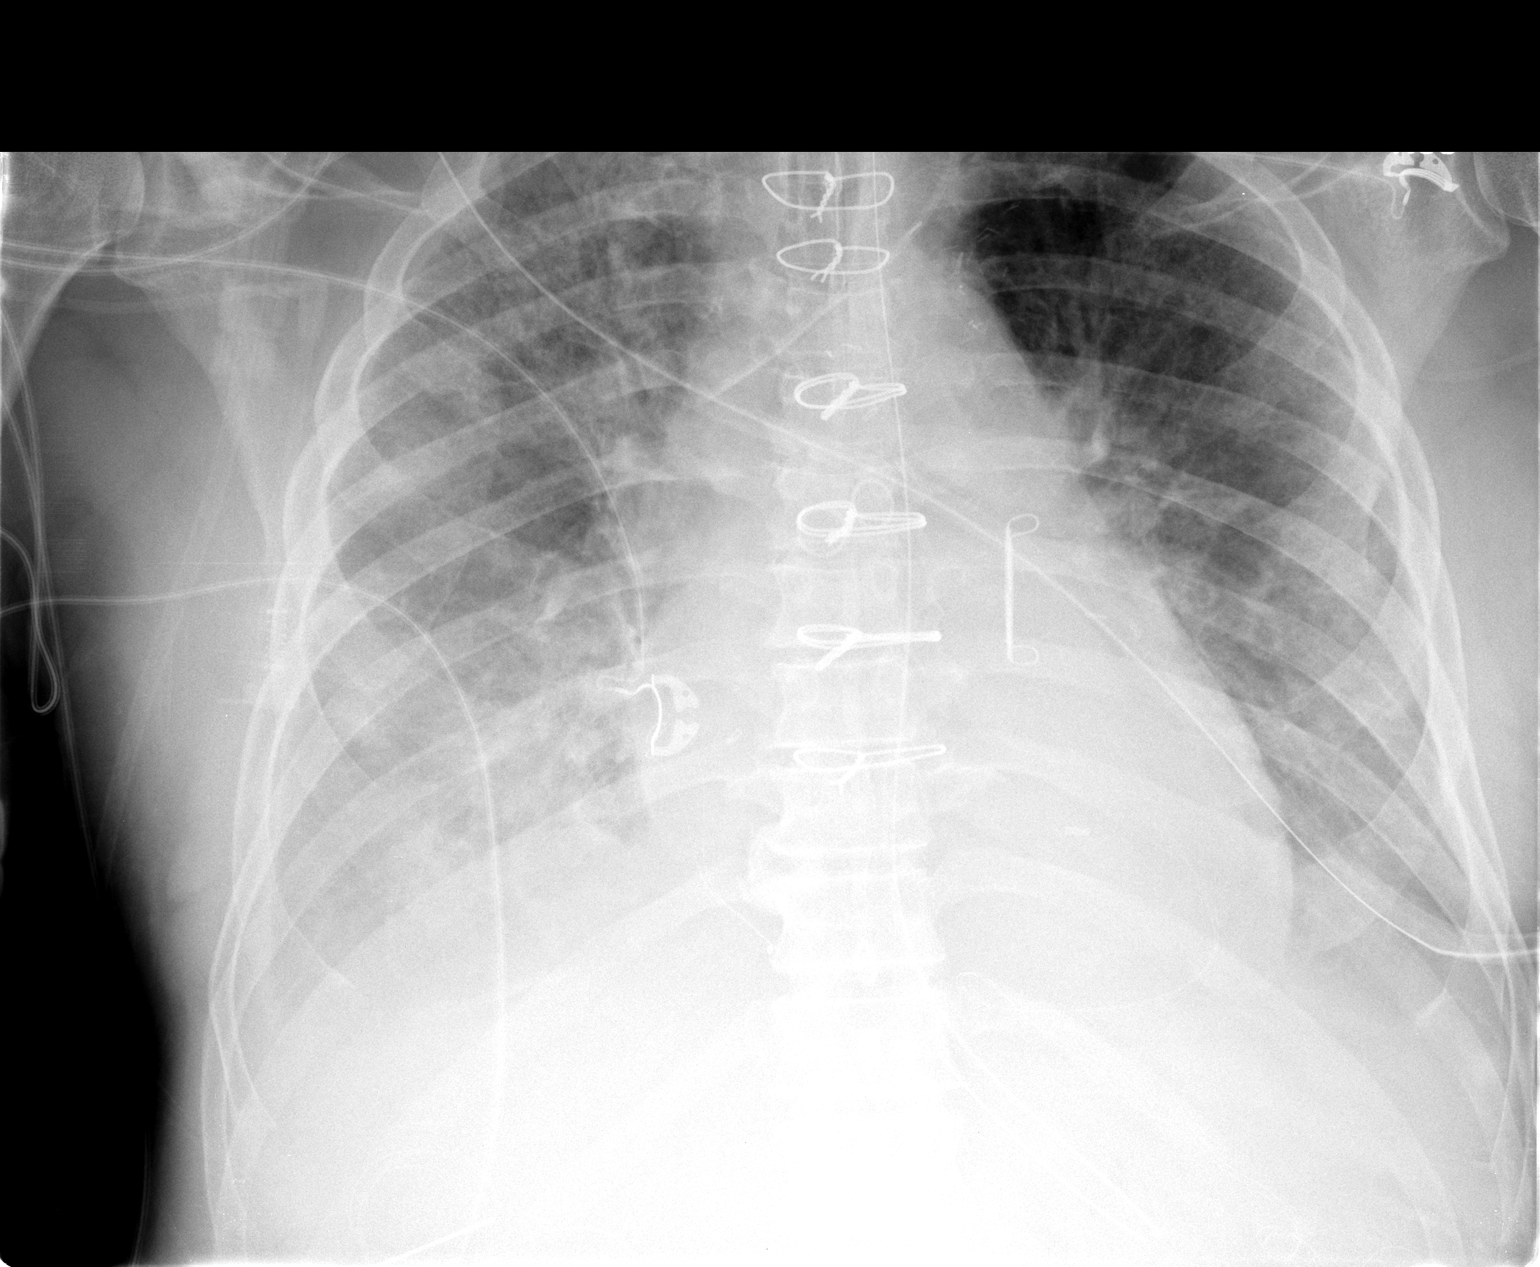

[1 of 1 positions shown; findings below may reference images not displayed]

FINDINGS: Stable position of endotracheal tube, left jugular
central venous catheter and nasogastric tube.  Please note that the
nasogastric tube is coiled and the tip is near the gastric cardia.
There is improving aeration in both lungs, particularly in the
right upper lung.  Findings suggest decreasing pulmonary edema.
Persistent basilar opacifications consistent with volume loss and
pleural fluid.  Stable postoperative changes in the chest.  No
evidence for a large pneumothorax.
IMPRESSION: Persistent but decreasing bilateral airspace disease.
Findings may represent decreasing pulmonary edema.

Bilateral pleural effusions.

## 2011-11-16 IMAGING — CR DG CHEST 1V PORT
1 series · 1 of 1 positions shown · non-contrast
Comparison: 02/01/2011

CLINICAL DATA: Ventilator dependent respiratory failure.  Postop
from coronary bypass grafting.

PORTABLE CHEST - 1 VIEW

[view not recorded]
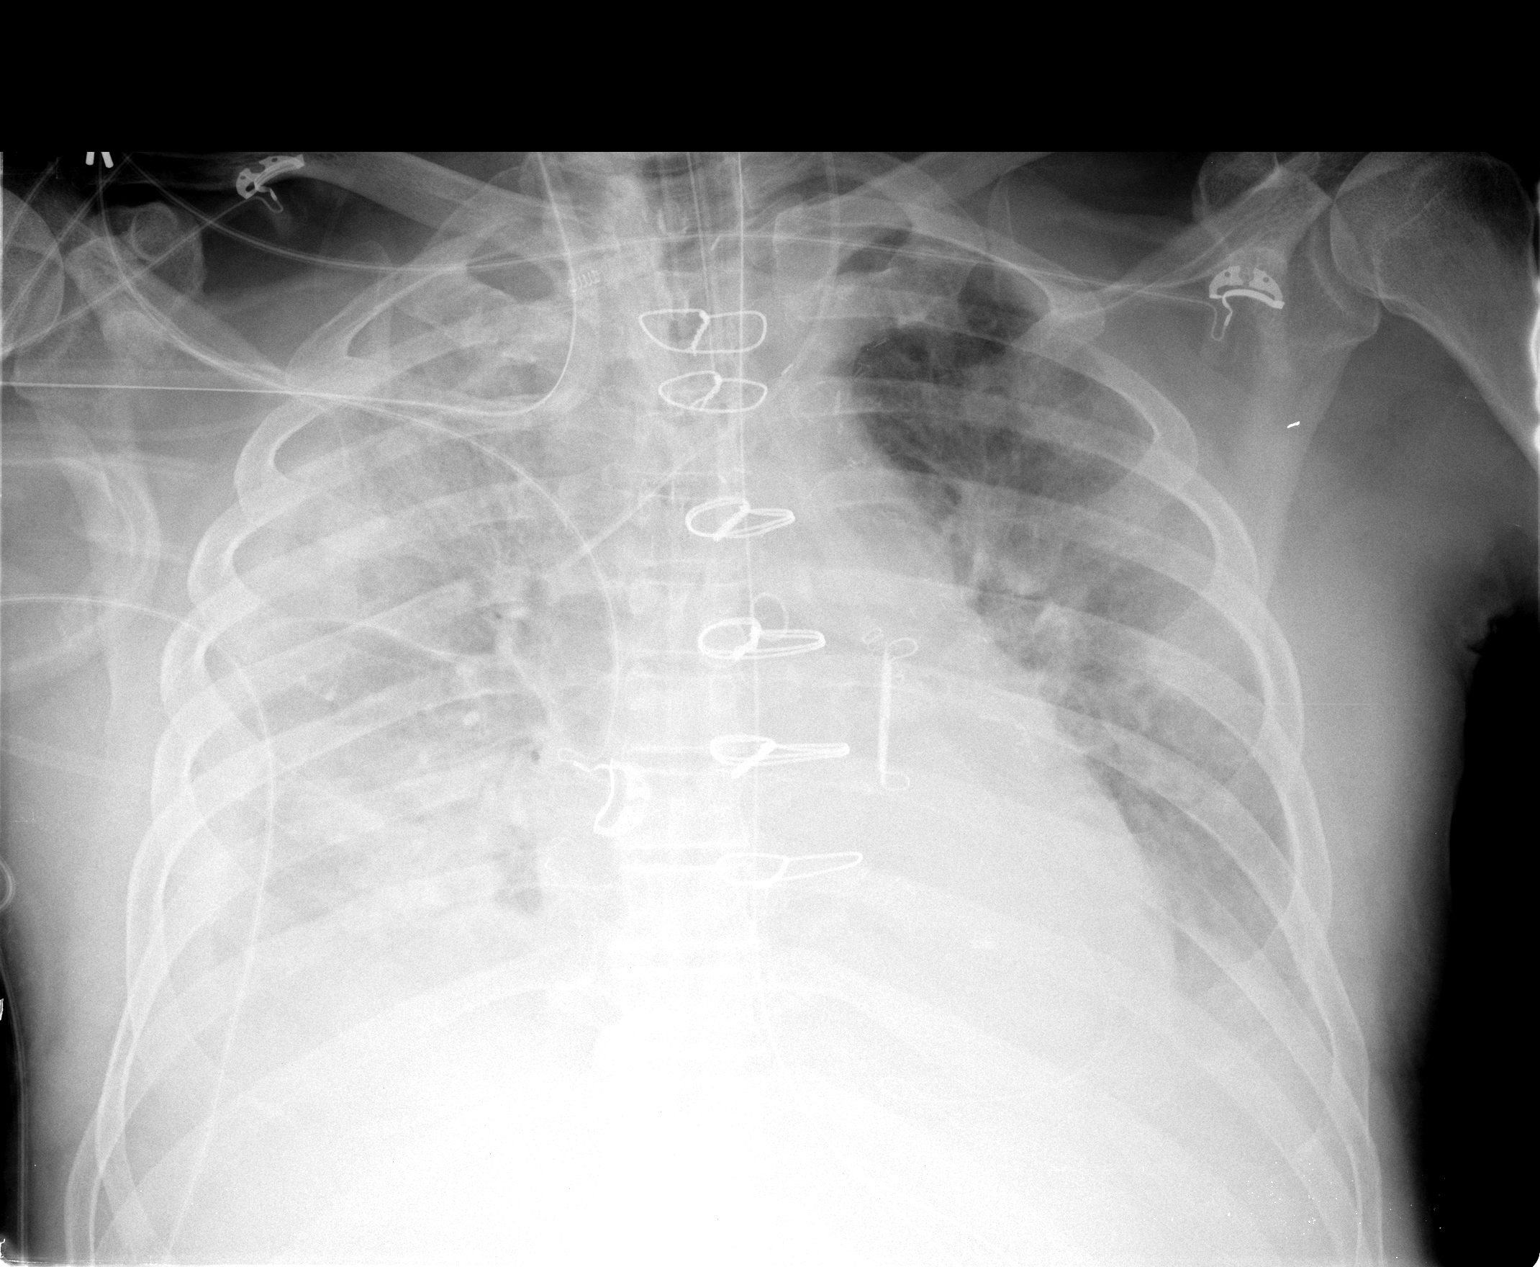

[1 of 1 positions shown; findings below may reference images not displayed]

FINDINGS: The support lines and tubes remain in appropriate
position.

Diffuse bilateral pulmonary air space disease is again seen.
Increased opacity is seen in the right hemithorax due to increased
size of a layering right pleural effusion.  Heart size is stable.
IMPRESSION: Increased size of layering right pleural effusion.  Otherwise no
significant change and diffuse bilateral pulmonary air space
disease.

## 2012-04-14 ENCOUNTER — Other Ambulatory Visit: Payer: Self-pay | Admitting: Dermatology

## 2012-04-15 ENCOUNTER — Encounter: Payer: Self-pay | Admitting: Internal Medicine

## 2012-05-26 ENCOUNTER — Telehealth: Payer: Self-pay | Admitting: Internal Medicine

## 2012-05-26 NOTE — Telephone Encounter (Signed)
Spoke with patient's wife and gave her an appointment on 06/15/12 at 10:00 AM with Dr. Juanda Chance for patient.

## 2012-05-27 ENCOUNTER — Encounter: Payer: Self-pay | Admitting: *Deleted

## 2012-06-02 ENCOUNTER — Encounter: Payer: Self-pay | Admitting: Internal Medicine

## 2012-06-11 ENCOUNTER — Encounter: Payer: BC Managed Care – PPO | Admitting: Internal Medicine

## 2012-06-15 ENCOUNTER — Encounter: Payer: Self-pay | Admitting: Internal Medicine

## 2012-06-15 ENCOUNTER — Ambulatory Visit (INDEPENDENT_AMBULATORY_CARE_PROVIDER_SITE_OTHER): Payer: Medicare Other | Admitting: Internal Medicine

## 2012-06-15 VITALS — BP 110/72 | HR 60 | Ht 69.0 in | Wt 200.0 lb

## 2012-06-15 DIAGNOSIS — K648 Other hemorrhoids: Secondary | ICD-10-CM

## 2012-06-15 DIAGNOSIS — K649 Unspecified hemorrhoids: Secondary | ICD-10-CM

## 2012-06-15 MED ORDER — ALIGN 4 MG PO CAPS: 1.0000 | ORAL_CAPSULE | Freq: Every day | ORAL | Status: DC

## 2012-06-15 MED ORDER — MOVIPREP 100 G PO SOLR: ORAL | Status: DC

## 2012-06-15 MED ORDER — HYDROCORTISONE ACETATE 25 MG RE SUPP: 25.0000 mg | Freq: Every day | RECTAL | Status: AC

## 2012-06-15 NOTE — Progress Notes (Signed)
Alexander [name] December 26, 1935 MRN [id number]   History of Present Illness:  This is a 76 year old, white male with a history of irritable bowel syndrome and currently symptomatic hemorrhoids. He describes a small amount of tissue prolapse at the side of the rectum which occurred several months ago and has responded to topical steroids. He was noted to have hemorrhoids on a prior colonoscopy in 1986 and again on flexible sigmoidoscopy in 1990. He denies diarrhea or constipation. He underwent a coronary artery bypass graft 16 months ago and was briefly on Coumadin for pulmonary emboli. He is now off anticoagulants. He has asymptomatic gallbladder disease as per an upper abdominal ultrasound in May 2012 which showed a 9 mm gallbladder wall and 1.5 cm gallstone. He has not had any symptoms consistent with biliary disease. He is due for a colonoscopy at this time.   Past Medical History  Diagnosis Date  . Pneumonia   . Pulmonary embolism 2012    s/p heart surgery  . Atrial fibrillation   . Heart attack   . Asthma   . Shortness of breath   . Hyperlipidemia   . Dysuria   . Blood transfusion   . Clotting disorder   . Difficulty urinating   . Rheumatic fever   . Family hx of alcoholism   . History of anal fissures   . Skin cancer     Hx of   . Anxiety   . Sleep apnea   . Hemorrhoids    Past Surgical History  Procedure Date  . Cardiac surgery   . Hernia repair 2010    bilateral  . Coronary artery bypass graft Febuary 13, 2012    full maze   . Scrotum exploration     multiple  . Mohs surgery     squamus   . Hip surgery     tumor removed from left hip     reports that he has quit smoking. He has never used smokeless tobacco. He reports that he does not drink alcohol or use illicit drugs. family history includes Bone cancer in his mother and Diabetes in his mother.  There is no history of Colon cancer. Allergies  Allergen Reactions  . Aspirin     Breathing complications   . Heparin      Blood clots         Review of Systems: Denies symptoms of reflux The remainder of the 10 point ROS is negative except as outlined in H&P   Physical Exam: General appearance  Well developed, in no distress. Eyes- non icteric. HEENT nontraumatic, normocephalic. Mouth no lesions, tongue papillated, no cheilosis. Neck supple without adenopathy, thyroid not enlarged, no carotid bruits, no JVD. Lungs Clear to auscultation bilaterally. Cor normal S1, normal S2, regular rhythm, no murmur,  quiet precordium. Abdomen: Soft nontender with normoactive bowel sounds. Liver edge 2 cm below right costal margin. No distention. Rectal: Rectal and anoscopic exam reveals 3 first grade hemorrhoids, one second degree hemorrhoid at 9:00 which  did not prolapse. It appears partially displaced into the anal canal, no thrombosis and no evidence of fissure. Extremities no pedal edema. Skin no lesions. Neurological alert and oriented x 3. Psychological normal mood and affect.  Assessment and Plan:  Problem #1 Symptomatic hemorrhoids. This is currently under good control with topical steroids. They were diagnosed 30 years ago when I first met the patient. I don't necessarily think that they need to be banded although it certainly is an option. We will put  him on Anusol-HC suppositories to use when necessary.  Problem #2 Colorectal screening. Her last full colonoscopy was in 1986. We will go ahead and schedule a screening colonoscopy.  Problem #3 Irritable bowel syndrome under good control with a high fiber diet. He has a history of C. difficile colitis after his heart surgery but is doing well now. I will give him samples of a probiotic.   06/15/2012 [name]

## 2012-06-15 NOTE — Patient Instructions (Addendum)
You have been scheduled for a colonoscopy with propofol. Please follow written instructions given to you at your visit today.  Please pick up your prep kit at the pharmacy within the next 1-3 days. We have sent the following medications to your pharmacy for you to pick up at your convenience: Anusol We have given you samples of Align. This puts good bacteria back into your colon. You should take 1 capsule by mouth once daily. If this works well for you, it can be purchased over the counter. CC: Dr Murray Hodgkins

## 2012-06-25 ENCOUNTER — Encounter: Payer: Self-pay | Admitting: Internal Medicine

## 2012-06-25 ENCOUNTER — Ambulatory Visit (AMBULATORY_SURGERY_CENTER): Payer: Medicare Other | Admitting: Internal Medicine

## 2012-06-25 VITALS — BP 126/76 | HR 74 | Temp 96.2°F | Resp 20 | Ht 69.0 in | Wt 200.0 lb

## 2012-06-25 DIAGNOSIS — Z1211 Encounter for screening for malignant neoplasm of colon: Secondary | ICD-10-CM

## 2012-06-25 DIAGNOSIS — K649 Unspecified hemorrhoids: Secondary | ICD-10-CM

## 2012-06-25 LAB — HM COLONOSCOPY

## 2012-06-25 MED ORDER — SODIUM CHLORIDE 0.9 % IV SOLN: 500.0000 mL | INTRAVENOUS | Status: DC

## 2012-06-25 NOTE — Progress Notes (Signed)
Patient did not have preoperative order for IV antibiotic SSI prophylaxis. (G8918)  Patient did not experience any of the following events: a burn prior to discharge; a fall within the facility; wrong site/side/patient/procedure/implant event; or a hospital transfer or hospital admission upon discharge from the facility. (G8907)  

## 2012-06-25 NOTE — Patient Instructions (Addendum)

## 2012-06-25 NOTE — Op Note (Signed)
 [facility] [address] [place], [place]  [address]  COLONOSCOPY PROCEDURE REPORT  PATIENT:  Alexander, [name]  MR#:  [id number] BIRTHDATE:  25-Aug-1936, 75 yrs. old  GENDER:  male ENDOSCOPIST:  Hedwig Morton. Juanda Chance, MD REF. BY:  Murray Hodgkins, M.D. PROCEDURE DATE:  06/25/2012 PROCEDURE:  Colonoscopy 19147 ASA CLASS:  Class II INDICATIONS:  hematochezia, colorectal cancer screening, average risk last colon 1986, flex sigmoid. 1990 MEDICATIONS:   MAC sedation, administered by CRNA, propofol (Diprivan) 130 mg  DESCRIPTION OF PROCEDURE:   After the risks and benefits and of the procedure were explained, informed consent was obtained. Digital rectal exam was performed and revealed no rectal masses. The LB [id number] endoscope was introduced through the anus and advanced to the cecum, which was identified by both the appendix and ileocecal valve.  The quality of the prep was good, using MoviPrep.  The instrument was then slowly withdrawn as the colon was fully examined. <<PROCEDUREIMAGES>>  FINDINGS:  Internal Hemorrhoids were found (see image8, image7, and image9).  Mild diverticulosis was found in the sigmoid colon (see image2 and image1).  This was otherwise a normal examination of the colon (see image3, image4, image5, and image6). Retroflexed views in the rectum revealed no abnormalities.    The scope was then withdrawn from the patient and the procedure completed.  COMPLICATIONS:  None ENDOSCOPIC IMPRESSION: 1) Internal hemorrhoids 2) Mild diverticulosis in the sigmoid colon 3) Otherwise normal examination RECOMMENDATIONS: 1) High fiber diet. Anusol HC supp 1 hs, continue prn  REPEAT EXAM:  In 10 year(s) for.  ______________________________ Hedwig Morton. Juanda Chance, MD  CC:  n. eSIGNED:   Hedwig Morton. Melady Chow at 06/25/2012 11:34 AM  Alexander [name], [phone]

## 2012-06-28 ENCOUNTER — Telehealth: Payer: Self-pay | Admitting: *Deleted

## 2012-06-28 NOTE — Telephone Encounter (Signed)
  Follow up Call-  Call back number 06/25/2012  Post procedure Call Back phone  # [phone]  Permission to leave phone message Yes     Patient questions:  Do you have a fever, pain , or abdominal swelling? no Pain Score  0 *  Have you tolerated food without any problems? yes  Have you been able to return to your normal activities? yes  Do you have any questions about your discharge instructions: Diet   no Medications  no Follow up visit  no  Do you have questions or concerns about your Care? no  Actions: * If pain score is 4 or above: No action needed, pain <4.

## 2013-03-15 ENCOUNTER — Other Ambulatory Visit: Payer: Self-pay | Admitting: Dermatology

## 2013-04-11 ENCOUNTER — Other Ambulatory Visit: Payer: Self-pay | Admitting: *Deleted

## 2013-04-11 ENCOUNTER — Other Ambulatory Visit: Payer: Medicare Other

## 2013-04-11 DIAGNOSIS — I10 Essential (primary) hypertension: Secondary | ICD-10-CM

## 2013-04-11 DIAGNOSIS — E785 Hyperlipidemia, unspecified: Secondary | ICD-10-CM

## 2013-04-12 LAB — COMPREHENSIVE METABOLIC PANEL
ALT: 17 IU/L (ref 0–44)
AST: 27 IU/L (ref 0–40)
Albumin/Globulin Ratio: 1.9 (ref 1.1–2.5)
CO2: 25 mmol/L (ref 19–28)
Calcium: 8.9 mg/dL (ref 8.6–10.2)
Chloride: 102 mmol/L (ref 97–108)
Glucose: 91 mg/dL (ref 65–99)
Potassium: 4.1 mmol/L (ref 3.5–5.2)
Sodium: 141 mmol/L (ref 134–144)

## 2013-04-12 LAB — LIPID PANEL
Cholesterol, Total: 163 mg/dL (ref 100–199)
HDL: 53 mg/dL (ref >39)
Triglycerides: 70 mg/dL (ref 0–149)

## 2013-04-13 ENCOUNTER — Encounter: Payer: Self-pay | Admitting: *Deleted

## 2013-04-13 ENCOUNTER — Ambulatory Visit (INDEPENDENT_AMBULATORY_CARE_PROVIDER_SITE_OTHER): Payer: Medicare Other | Admitting: Internal Medicine

## 2013-04-13 ENCOUNTER — Encounter: Payer: Self-pay | Admitting: Internal Medicine

## 2013-04-13 VITALS — BP 118/64 | HR 66 | Temp 96.1°F | Resp 16 | Ht 59.0 in | Wt 192.0 lb

## 2013-04-13 DIAGNOSIS — I10 Essential (primary) hypertension: Secondary | ICD-10-CM

## 2013-04-13 DIAGNOSIS — I491 Atrial premature depolarization: Secondary | ICD-10-CM

## 2013-04-13 DIAGNOSIS — J45909 Unspecified asthma, uncomplicated: Secondary | ICD-10-CM

## 2013-04-13 DIAGNOSIS — E785 Hyperlipidemia, unspecified: Secondary | ICD-10-CM

## 2013-04-13 MED ORDER — ALBUTEROL SULFATE HFA 108 (90 BASE) MCG/ACT IN AERS: 2.0000 | INHALATION_SPRAY | Freq: Four times a day (QID) | RESPIRATORY_TRACT | Status: AC | PRN

## 2013-04-13 MED ORDER — DIAZEPAM 5 MG PO TABS: 5.0000 mg | ORAL_TABLET | Freq: Four times a day (QID) | ORAL | Status: DC | PRN

## 2013-04-13 NOTE — Progress Notes (Signed)
Subjective:    Patient ID: Alexander [name], male    DOB: June 14, 1936, 77 y.o.   MRN: [id number]  HPI Some asthma when he exercises.  Denies chest pains. Has not noted any significant arrhythmias recently. Home blood pressure has been under good control. Patient remains asymptomatic with his gallstones.  Review of Systems  Constitutional: Negative for fever, chills, activity change, appetite change, fatigue and unexpected weight change.  HENT: Positive for hearing loss. Negative for ear pain and congestion.   Eyes: Negative.   Respiratory: Negative.  Negative for choking, chest tightness and shortness of breath.   Cardiovascular: Negative.  Negative for chest pain, palpitations and leg swelling.  Gastrointestinal: Negative.   Endocrine: Negative.   Genitourinary: Negative.   Musculoskeletal: Negative.   Skin: Negative.   Allergic/Immunologic: Negative.   Neurological: Negative.   Hematological: Negative.   Psychiatric/Behavioral: Negative.        Objective:   Physical Exam  Constitutional: He is oriented to person, place, and time. He appears well-developed and well-nourished. No distress.  HENT:  Head: Normocephalic and atraumatic.  Right Ear: External ear normal.  Left Ear: External ear normal.  Partial hearing loss bilaterally.  Eyes: Conjunctivae and EOM are normal. Pupils are equal, round, and reactive to light.  Neck: Normal range of motion. Neck supple. No JVD present. No tracheal deviation present. No thyromegaly present.  Cardiovascular: Normal rate, regular rhythm, normal heart sounds and intact distal pulses.  Exam reveals no gallop and no friction rub.   No murmur heard. Occasional PAC.  Pulmonary/Chest: Effort normal and breath sounds normal. No respiratory distress. He has no wheezes. He has no rales. He exhibits no tenderness.  Abdominal: Soft. Bowel sounds are normal. He exhibits no distension and no mass. There is no tenderness.  Musculoskeletal: Normal range  of motion. He exhibits no edema and no tenderness.  Lymphadenopathy:    He has no cervical adenopathy.  Neurological: He is alert and oriented to person, place, and time. No cranial nerve deficit. Coordination normal.  Skin: Skin is warm and dry. No rash noted. No erythema. No pallor.  Psychiatric: He has a normal mood and affect. His behavior is normal. Judgment and thought content normal.     04/12/2013 EKG: Rate 62. Normal sinus rhythm. Occasional PAC. LAD. CMP     Component Value Date/Time   NA 141 04/11/2013 0946   NA 136 04/10/2011 1842   K 4.1 04/11/2013 0946   CL 102 04/11/2013 0946   CO2 25 04/11/2013 0946   GLUCOSE 91 04/11/2013 0946   GLUCOSE 106 SLIGHT HEMOLYSIS* 04/10/2011 1842   BUN 16 04/11/2013 0946   BUN 15 04/10/2011 1842   CREATININE 1.12 04/11/2013 0946   CALCIUM 8.9 04/11/2013 0946   PROT 6.0 04/11/2013 0946   PROT 6.7 04/10/2011 2245   ALBUMIN 3.5 04/10/2011 2245   AST 27 04/11/2013 0946   ALT 17 04/11/2013 0946   ALKPHOS 48 04/11/2013 0946   BILITOT 1.4* 04/11/2013 0946   GFRNONAA 64 04/11/2013 0946   GFRAA 74 04/11/2013 0946   Lipid Panel     Component Value Date/Time   CHOL  Value: 228        ATP III CLASSIFICATION:  <200     mg/dL   Desirable  147-829  mg/dL   Borderline High  >=562    mg/dL   High       * 01/07/8656 0320   TRIG 70 04/11/2013 0946   HDL 53 04/11/2013 0946  HDL 49 01/17/2011 0320   CHOLHDL 3.1 04/11/2013 0946   CHOLHDL 4.7 01/17/2011 0320   VLDL 8 01/17/2011 0320   LDLCALC 96 04/11/2013 0946   LDLCALC  Value: 171        Total Cholesterol/HDL:CHD Risk Coronary Heart Disease Risk Table                     Men   Women  1/2 Average Risk   3.4   3.3  Average Risk       5.0   4.4  2 X Average Risk   9.6   7.1  3 X Average Risk  23.4   11.0        Use the calculated Patient Ratio above and the CHD Risk Table to determine the patient's CHD Risk.        ATP III CLASSIFICATION (LDL):  <100     mg/dL   Optimal  161-096  mg/dL   Near or Above                    Optimal  130-159  mg/dL    Borderline  045-409  mg/dL   High  >811     mg/dL   Very High* 08/21/7828 0320          Assessment & Plan:  1. Unspecified essential hypertension Good control - EKG 12-Lead - CMP; Future  2. Atrial premature complexes Incidental finding. Patient has long-standing history of paroxysmal mitral fibrillation and PAC. - EKG 12-Lead; Future  3. Other and unspecified hyperlipidemia Significantly improved since 2012. LDL now less than 100. - Lipid Panel; Future  4. Intrinsic asthma Occasional wheeze with pollen attacks. Although not wheezing today he wanted to keep an albuterol inhaler on hand at home. - albuterol (PROVENTIL HFA;VENTOLIN HFA) 108 (90 BASE) MCG/ACT inhaler; Inhale 2 puffs into the lungs every 6 (six) hours as needed for wheezing.  Dispense: 1 Inhaler; Refill: 0

## 2013-04-13 NOTE — Patient Instructions (Signed)
Continue current medications. 

## 2013-05-06 ENCOUNTER — Ambulatory Visit: Payer: Medicare Other | Admitting: Internal Medicine

## 2013-07-12 ENCOUNTER — Other Ambulatory Visit: Payer: Medicare Other

## 2013-07-12 DIAGNOSIS — E785 Hyperlipidemia, unspecified: Secondary | ICD-10-CM

## 2013-07-12 DIAGNOSIS — I10 Essential (primary) hypertension: Secondary | ICD-10-CM

## 2013-07-13 LAB — LIPID PANEL
Cholesterol, Total: 159 mg/dL (ref 100–199)
LDL Calculated: 91 mg/dL (ref 0–99)
Triglycerides: 72 mg/dL (ref 0–149)

## 2013-07-13 LAB — COMPREHENSIVE METABOLIC PANEL
Albumin: 4 g/dL (ref 3.5–4.8)
Alkaline Phosphatase: 49 IU/L (ref 39–117)
BUN/Creatinine Ratio: 14 (ref 10–22)
BUN: 16 mg/dL (ref 8–27)
CO2: 24 mmol/L (ref 18–29)
Chloride: 102 mmol/L (ref 97–108)
Glucose: 96 mg/dL (ref 65–99)
Total Protein: 6.1 g/dL (ref 6.0–8.5)

## 2013-09-01 ENCOUNTER — Telehealth: Payer: Self-pay | Admitting: Internal Medicine

## 2013-09-01 NOTE — Telephone Encounter (Signed)
Last colon 06/2012- mild diverticulosis, hx of IBS. Please send Bentyl 10 mg , #40, 1 po q 4-6 hrs, prn crampy abd. Pain, 1 refill.

## 2013-09-01 NOTE — Telephone Encounter (Signed)
Patient states for the last few weeks, he has been having stomach cramping. Eats breakfast without any problems. Eating lunch and dinner and then has cramping within 1-1 1/2 hours. He also has lots of gas. He has been under a lot of stress for the last month. He is on Clinoril and Crestor. He also has a history of gall stones. He states the cramping does not keep him up a night or get worse when he goes to bed.  He does not have diarrhea. Please, advise.

## 2013-09-02 MED ORDER — DICYCLOMINE HCL 10 MG PO CAPS: ORAL_CAPSULE | ORAL | Status: DC

## 2013-09-02 NOTE — Telephone Encounter (Signed)
Rx sent to pharmacy. Left a message for patient to call. 

## 2013-09-02 NOTE — Telephone Encounter (Signed)
Patient notified of Dr. Brodie's recommendation 

## 2013-09-06 ENCOUNTER — Other Ambulatory Visit: Payer: Self-pay | Admitting: *Deleted

## 2013-09-09 ENCOUNTER — Telehealth: Payer: Self-pay | Admitting: Internal Medicine

## 2013-09-09 NOTE — Telephone Encounter (Signed)
OK to send Levsin SL .125 mg, #30, 1 sl q4 hrs prn IBS, 1 refill

## 2013-09-09 NOTE — Telephone Encounter (Signed)
Patient states he tried taking the Bentyl last week. It did help him but he had the side effect of nervousness and light-headedness. He is asking if he could try Levsin instead. Please, advise.

## 2013-09-12 MED ORDER — HYOSCYAMINE SULFATE 0.125 MG SL SUBL: SUBLINGUAL_TABLET | SUBLINGUAL | Status: DC

## 2013-09-12 NOTE — Telephone Encounter (Signed)
Spoke with patient and sent rx for Levsin.  Patient states he had a gall bladder attack on Friday. His MD at [facility] is having him get an ultrasound and labs.

## 2013-09-12 NOTE — Telephone Encounter (Signed)
OK, we have known about his gall stone since 2012, he has been asymptomatic until this point, so if he becomes symptomatic he will need lap chole. We will be available to help.

## 2013-09-15 ENCOUNTER — Telehealth: Payer: Self-pay | Admitting: *Deleted

## 2013-09-15 NOTE — Telephone Encounter (Signed)
Patient's insurance has denied hyoscyamine for patient. Please see telephone note from 09-09-13. Patient tried Bentyl but had side effects. Try glycopyrrolate?? Please advise.

## 2013-09-15 NOTE — Telephone Encounter (Signed)
OK to try Glycopyrrolate 2mg  po bid, #60, 1 refill.

## 2013-09-16 MED ORDER — GLYCOPYRROLATE 2 MG PO TABS: 2.0000 mg | ORAL_TABLET | Freq: Two times a day (BID) | ORAL | Status: DC

## 2013-09-16 NOTE — Telephone Encounter (Signed)
Patient verbalizes understanding of Dr Regino Schultze recommendations and will pick up glycopyrrolate. Rx sent.

## 2013-10-10 ENCOUNTER — Encounter: Payer: Self-pay | Admitting: Interventional Cardiology

## 2013-10-13 ENCOUNTER — Ambulatory Visit (INDEPENDENT_AMBULATORY_CARE_PROVIDER_SITE_OTHER): Payer: Medicare Other | Admitting: Internal Medicine

## 2013-10-13 ENCOUNTER — Encounter: Payer: Self-pay | Admitting: Internal Medicine

## 2013-10-13 VITALS — BP 124/70 | HR 88 | Ht 69.0 in | Wt 190.0 lb

## 2013-10-13 DIAGNOSIS — R1011 Right upper quadrant pain: Secondary | ICD-10-CM

## 2013-10-13 DIAGNOSIS — K8020 Calculus of gallbladder without cholecystitis without obstruction: Secondary | ICD-10-CM

## 2013-10-13 NOTE — Progress Notes (Signed)
Alexander [name] 06-29-1936 MRN [id number]   History of Present Illness:  This is a 77 year old white male with gallstones documented initially on an ultrasound in 2012 who has now become symptomatic. His first attack of biliary colic occurred in May 2012 when he was found to have a 1.5 cm gallstone and a 9 mm gallbladder wall. His common bile duct was 4.6 mm and his liver appeared normal. His spleen was normal size.. I have been following him for many years for irritable bowel syndrome managed with diet and bentyl. He had mild elevation of his liver function tests in August 2014 but the last set was normal. Patient underwent a coronary artery bypass graft 2.5 years ago which was complicated by pneumonia and heparin allergy. He is doing very well from that standpoint and has been working out almost every day. We did a recent colonoscopy in July 2013 for screening and he was found to have internal hemorrhoids and diverticuli. A second attack of gallbladder colic occured 6 weeks ago. Patient was seen at the [facility] and had a repeat ultrasound of the upper abdomen which again confirmed the presence of gallstones. The gallbladder wall was again thickened. He was recommended for a laparoscopic cholecystectomy but wanted to have it done in [place].   Past Medical History  Diagnosis Date  . Pneumonia   . Pulmonary embolism 2012    s/p heart surgery  . Atrial fibrillation   . Heart attack   . Asthma   . Shortness of breath   . Hyperlipidemia   . Dysuria   . Blood transfusion   . Clotting disorder   . Difficulty urinating   . Rheumatic fever   . Family hx of alcoholism   . History of anal fissures   . Skin cancer     Hx of   . Anxiety   . Sleep apnea     no CPAP  . Hemorrhoids   . Diverticulosis of colon (without mention of hemorrhage)   . Internal hemorrhoids without mention of complication   . Allergic rhinitis due to pollen   . Basal cell carcinoma of skin of lip   . History  of cholelithiasis   . Edema   . Nonspecific elevation of levels of transaminase or lactic acid dehydrogenase (LDH)   . Long term (current) use of anticoagulants   . Other abnormal blood chemistry   . Other voice and resonance disorders   . Coronary atherosclerosis of native coronary artery   . COPD (chronic obstructive pulmonary disease)   . Hypertrophy of prostate with urinary obstruction and other lower urinary tract symptoms (LUTS)   . Hypertension   . Impotence of organic origin   . Unspecified constipation   . Anal fissure    Past Surgical History  Procedure Laterality Date  . Cardiac surgery    . Hernia repair  2010    bilateral  . Coronary artery bypass graft  Febuary 13, 2012    full maze   . Scrotum exploration      multiple  . Mohs surgery      squamus   . Hip surgery      tumor removed from left hip   . Vasectomy  1993  . Superficial skin cystectomy (l) hip  1997    dr Logan Bores  . Hydrocele excision      dr Annabell Howells   . Bilateral inguinal herniorrhaphy w/mesh  2009    dr Abbey Chatters  . Midian sterotomy,coronary artery  bypass graft 3  01/20/2011    reports that he has quit smoking. His smoking use included Cigarettes. He smoked 0.00 packs per day. He has never used smokeless tobacco. He reports that he drinks alcohol. He reports that he does not use illicit drugs. family history includes Bone cancer in his mother; Diabetes in his mother. There is no history of Colon cancer, Esophageal cancer, Rectal cancer, or Stomach cancer. Allergies  Allergen Reactions  . Aspirin     Breathing complications, able to take three times a week  . Codeine   . Desyrel [Trazodone]   . Heparin     Blood clots   . Oxycodone   . Plavix [Clopidogrel Bisulfate]   . Zocor [Simvastatin]   . Zoloft [Sertraline Hcl]         Review of Systems: Denies weight loss nausea vomiting or rectal bleeding  The remainder of the 10 point ROS is negative except as outlined in H&P   Physical  Exam: General appearance  Well developed, in no distress. Appearing younger than his stated age Eyes- non icteric. HEENT nontraumatic, normocephalic. Mouth no lesions, tongue papillated, no cheilosis. Neck supple without adenopathy, thyroid not enlarged, no carotid bruits, no JVD. Lungs Clear to auscultation bilaterally. Well-healed thoracotomy scar Cor normal S1, normal S2, regular rhythm, no murmur,  quiet precordium. Abdomen: Scaphoid soft nontender with normoactive bowel sounds. Liver edge at costal margin. No tenderness. Rectal: Not done. Extremities no pedal edema. Skin no lesions. Neurological alert and oriented x 3. Psychological normal mood and affect.  Assessment and Plan:  Problem #26 77 year old white male with symptomatic gallbladder disease with 2 discrete episodes of what sounds like biliary colic. He is currently doing well but is interested in having a laparoscopic cholecystectomy. His gallbladder appears chronically inflamed but the CBD is normal size. He has a large stone which is not likely to pass. I have discussed the possibility of doing a HIDA scan, but since he wants to move on with the surgery anyway, the HIDA scan is not needed to make the decision.. I have given him a brochure on gallbladder surgery and have asked him to stay on low-fat diet. We have made an appointment for him to have a surgical consultation.   10/13/2013 Alexander [name]

## 2013-10-13 NOTE — Patient Instructions (Addendum)
You have been scheduled for an appointment with Dr Abbey Chatters at [facility]. Your appointment is on 11/01/13 at 9:00 am. Please arrive at 9:15 am for registration. Make certain to bring a list of current medications, including any over the counter medications or vitamins. Also bring your co-pay if you have one as well as your insurance cards. [facility] is located at [address], [address]. Should you need to reschedule your appointment, please contact them at [phone].  We have given a brochure regarding cholecystectomy.  CC: Dr Chilton Si, Dr Abbey Chatters

## 2013-10-17 ENCOUNTER — Other Ambulatory Visit: Payer: Medicare Other

## 2013-10-18 ENCOUNTER — Other Ambulatory Visit: Payer: Medicare Other

## 2013-10-19 ENCOUNTER — Ambulatory Visit (INDEPENDENT_AMBULATORY_CARE_PROVIDER_SITE_OTHER): Payer: Medicare Other | Admitting: Internal Medicine

## 2013-10-19 VITALS — BP 122/80 | HR 54 | Temp 97.8°F | Ht 70.0 in | Wt 190.4 lb

## 2013-10-19 DIAGNOSIS — F411 Generalized anxiety disorder: Secondary | ICD-10-CM

## 2013-10-19 DIAGNOSIS — I10 Essential (primary) hypertension: Secondary | ICD-10-CM

## 2013-10-19 DIAGNOSIS — K8020 Calculus of gallbladder without cholecystitis without obstruction: Secondary | ICD-10-CM

## 2013-10-19 DIAGNOSIS — E785 Hyperlipidemia, unspecified: Secondary | ICD-10-CM

## 2013-10-19 DIAGNOSIS — I491 Atrial premature depolarization: Secondary | ICD-10-CM

## 2013-10-19 DIAGNOSIS — N400 Enlarged prostate without lower urinary tract symptoms: Secondary | ICD-10-CM | POA: Insufficient documentation

## 2013-10-19 MED ORDER — AMITRIPTYLINE HCL 25 MG PO TABS: 25.0000 mg | ORAL_TABLET | Freq: Every day | ORAL | Status: DC

## 2013-10-19 MED ORDER — DIAZEPAM 5 MG PO TABS: ORAL_TABLET | ORAL | Status: DC

## 2013-10-19 MED ORDER — CYCLOBENZAPRINE HCL 10 MG PO TABS: ORAL_TABLET | ORAL | Status: DC

## 2013-10-19 MED ORDER — ALPRAZOLAM 0.5 MG PO TABS: 0.5000 mg | ORAL_TABLET | ORAL | Status: DC

## 2013-10-19 NOTE — Patient Instructions (Signed)
Continue current medications. 

## 2013-10-19 NOTE — Progress Notes (Signed)
Patient ID: Alexander [name], male   DOB: 1935-12-20, 77 y.o.   MRN: [id number] Nursing Home Location:      Place of Service:  [facility] office  PCP: Kimber Relic, MD  Code Status: Living Will   Allergies  Allergen Reactions  . Aspirin     Breathing complications, able to take three times a week  . Codeine   . Desyrel [Trazodone]   . Heparin     Blood clots   . Oxycodone   . Plavix [Clopidogrel Bisulfate]   . Zocor [Simvastatin]   . Zoloft [Sertraline Hcl]     Chief Complaint  Patient presents with  . Annual Exam    physical with labs printed  . other    last EKG done 06/2013, will get flu vacine at [facility]     HPI:   Symptomatic cholelithiasis: Having increased symptoms from known cholelithiasis. Has seen surgeon. Plans removal of GB  Unspecified essential hypertension: Well controlled at this time  Other and unspecified hyperlipidemia: Controlled  Atrial premature complexes: This is a long-standing issue. He has also had atrial fibrillation.  Hypertrophy of prostate without urinary obstruction and other lower urinary tract symptoms (LUTS) - : Chronic issue. Discussed whether we should continue to do PSA. He would like this test repeated periodically.  Anxiety state, unspecified : Chronic issue which he feels is under good control at this time. He does occasionally use alprazolam or diazepam.   Past Medical History  Diagnosis Date  . Pneumonia   . Pulmonary embolism 2012    s/p heart surgery  . Atrial fibrillation   . Heart attack   . Asthma   . Shortness of breath   . Hyperlipidemia   . Dysuria   . Blood transfusion   . Clotting disorder   . Difficulty urinating   . Rheumatic fever   . Family hx of alcoholism   . History of anal fissures   . Skin cancer     Hx of   . Anxiety   . Sleep apnea     no CPAP  . Hemorrhoids   . Diverticulosis of colon (without mention of hemorrhage)   . Internal hemorrhoids without mention of complication   . Allergic rhinitis  due to pollen   . Basal cell carcinoma of skin of lip   . History of cholelithiasis   . Edema   . Nonspecific elevation of levels of transaminase or lactic acid dehydrogenase (LDH)   . Long term (current) use of anticoagulants   . Other abnormal blood chemistry   . Other voice and resonance disorders   . Coronary atherosclerosis of native coronary artery   . COPD (chronic obstructive pulmonary disease)   . Hypertrophy of prostate with urinary obstruction and other lower urinary tract symptoms (LUTS)   . Hypertension   . Impotence of organic origin   . Unspecified constipation   . Anal fissure     Past Surgical History  Procedure Laterality Date  . Cardiac surgery    . Hernia repair  2010    bilateral  . Coronary artery bypass graft  Febuary 13, 2012    full maze   . Scrotum exploration      multiple  . Mohs surgery      squamus   . Hip surgery      tumor removed from left hip   . Vasectomy  04/1992  . Superficial skin cystectomy (l) hip  1997    dr Logan Bores  .  Hydrocele excision      dr Annabell Howells   . Bilateral inguinal herniorrhaphy w/mesh  2009    dr Abbey Chatters  . Midian sterotomy,coronary artery bypass graft 3  01/20/2011  . Spermatocelectomy  02/1999    Right, Dr.[name]    [facility] Orthopedics: Dr. Evie Lacks. Valma Cava Pulmonary: Dr. C.D. Young/ Dr. Marchelle Gearing Dr. Billy Fischer GI: Dr. [name] Allergy: Dr. Hilbert Callas Cardiology: Dr. Verdis Prime  Urology: Dr. Annabell Howells Cardiac surgery: Dr. Edwina Barth Hematology: Dr. Cyndie Chime  ENT: Dr. Narda Bonds General surgery: Dr. Abbey Chatters  PAST PROCEDURES 1990 Sigmoidoscopy: Normal 2000 Cardiolite stress test: Normal 2000 CT pelvis and abdomen: Normal  2002 CT pelvis and abdomen: Normal 06/28/07 bladder skin: Normal 2012 chest x-ray: Normal 01/17/11 left heart catheterization: Ejection fraction 70% , left ventricular cavity and systolic function normal, left main coronary artery widely patent. Left anterior  descending and left main are calcified. Complex trifurcation disease involving the LAD, first diagonal, and a large septal perforator. The circumflex artery is free of obstruction. Right coronary artery 70% narrowed. Surgical intervention was planned. 06/25/12 colonoscopy (Dr. [name]): Mild diverticulosis. Internal hemorrhoids.  PAST HOSPITALIZATION 01/17/2011 through 02/14/11 for acute coronary syndrome. Underwent cardiac catheterization followed by cardiac surgery with three-vessel CABG. Complicated postoperative course with recurrent atrial fibrillation. Cysts developed increasing respiratory distress. Chest x-ray ultimately suggested a right upper lobe pneumonia. This was treated and resolved. Platelet count fell to 65,000. This improved after discontinuation of heparin. There was a small nonocclusive pulmonary embolus in the right upper, right middle, and right lower lobes. He then developed Clostridium difficile colitis and postoperative thrush. He had a postoperative acute blood loss anemia and a postoperative volume overload. He ultimately recovered and was discharged home.  Social History: History   Social History  . Marital Status: Married    Spouse Name: N/A    Number of Children: 1  . Years of Education: N/A   Occupational History  . Car Salesman [employer]   Social History Main Topics  . Smoking status: Former Smoker    Types: Cigarettes    Quit date: 12/08/1977  . Smokeless tobacco: Never Used  . Alcohol Use: Yes     Comment: former heavy alcohol use, rare use  . Drug Use: No  . Sexual Activity: Yes    Partners: Female   Other Topics Concern  . None   Social History Narrative   Daily caffeine       [name]   [name]   Dr. Salena Saner Young-Pulmonologist   [name]   [name]    [name]   [name]   Dr.Simonds-Pulmonologist   Dr.Rosenbower-General Surgeon   Dr.Stephen  [name]-Cardiac Surgeon   Dr.Granfortuna-Hematologist    Dr.Ramaswamy-Pulmonary   Dr.Chris Newman-ENT     Family History Family Status  Relation Status Death Age  . Mother Deceased   . Father Deceased   . Brother Alive   . Son Alive   . Son Deceased 31    accident   Family History  Problem Relation Age of Onset  . Bone cancer Mother   . Diabetes Mother   . Colon cancer Neg Hx   . Esophageal cancer Neg Hx   . Rectal cancer Neg Hx   . Stomach cancer Neg Hx      Medications: Patient's Medications  New Prescriptions   No medications on file  Previous Medications   ALBUTEROL (PROVENTIL HFA;VENTOLIN HFA) 108 (90 BASE) MCG/ACT INHALER    Inhale 2 puffs into the lungs  every 6 (six) hours as needed for wheezing.   ALPRAZOLAM (XANAX) 0.5 MG TABLET    Take 0.5 mg by mouth as directed.    AMITRIPTYLINE (ELAVIL) 25 MG TABLET    Take 25 mg by mouth at bedtime. Pt takes 1/4 tablet qhs    ASPIRIN 81 MG TABLET    Take 81 mg by mouth daily.     CHOLECALCIFEROL (VITAMIN D PO)    Take by mouth. Takes 800 iu as directed   CYCLOBENZAPRINE (FLEXERIL) 10 MG TABLET    Take 10 mg by mouth 3 (three) times daily as needed for muscle spasms. Take one tablet 3 times a day for anxiety and to control palpitations   DIAZEPAM (VALIUM) 5 MG TABLET    Take 1 tablet (5 mg total) by mouth every 6 (six) hours as needed.   DICYCLOMINE (BENTYL) 10 MG CAPSULE       FLAXSEED, LINSEED, 1000 MG CAPS    Take 1 capsule by mouth daily.   MULTIPLE VITAMINS-MINERALS (CENTRUM SILVER PO)    Take 1 capsule by mouth daily.   OMEGA-3 FATTY ACIDS (FISH OIL) 1000 MG CAPS    Take 1 capsule by mouth daily.   PROBIOTIC PRODUCT (ALIGN) 4 MG CAPS    Take 1 capsule by mouth daily.   ROSUVASTATIN (CRESTOR) 10 MG TABLET    Take 5 mg by mouth daily.    SULINDAC (CLINORIL) 200 MG TABLET    Takes 100 mg by mouth four times a week  Modified Medications   No medications on file  Discontinued Medications   GLYCOPYRROLATE (ROBINUL) 2  MG TABLET    Take 1 tablet (2 mg total) by mouth 2 (two) times daily.    Immunization History  Administered Date(s) Administered  . Pneumococcal Conjugate 10/16/2010  . Td 12/15/2007     Review of Systems  Constitutional: Positive for malaise/fatigue.  HENT: Positive for congestion (recurrent sinus), hearing loss and tinnitus.        Dentures. Missing some teeth.  Eyes: Negative.   Respiratory: Negative for cough, sputum production and shortness of breath.   Cardiovascular: Negative for chest pain, palpitations, orthopnea, claudication, leg swelling and PND.       History of CABG x3, paroxysmal atrial fibrillation, and angina.  Gastrointestinal: Negative.   Genitourinary: Negative.        History benign prostatic hypertrophy.  Musculoskeletal: Negative for back pain, myalgias and neck pain.  Skin: Negative.   Neurological: Negative for dizziness, tingling, tremors, sensory change and seizures.  Endo/Heme/Allergies: Negative.   Psychiatric/Behavioral: Negative for depression, suicidal ideas, hallucinations, memory loss and substance abuse. The patient is nervous/anxious.      Filed Vitals:   10/19/13 1523  BP: 122/80  Pulse: 54  Temp: 97.8 F (36.6 C)  TempSrc: Oral  Height: 5\' 10"  (1.778 m)  Weight: 190 lb 6.4 oz (86.365 kg)  SpO2: 95%   Physical Exam  Nursing note and vitals reviewed. Constitutional: He appears well-developed and well-nourished. No distress.  HENT:  Head: Normocephalic and atraumatic.  Right Ear: External ear normal.  Left Ear: External ear normal.  Nose: Nose normal.  Mouth/Throat: Oropharynx is clear and moist.  Dentures. Missing some teeth.  Eyes: Conjunctivae and EOM are normal. Pupils are equal, round, and reactive to light.  Neck: No JVD present. No tracheal deviation present. No thyromegaly present.  Cardiovascular: Normal rate, regular rhythm, normal heart sounds and intact distal pulses.  Exam reveals no gallop and no friction rub.  No  murmur heard. Pulmonary/Chest: No respiratory distress. He has no wheezes. He has no rales. He exhibits no tenderness.  Abdominal: He exhibits no distension and no mass. There is no tenderness. No hernia.  Genitourinary:  Enlarged prostate  Musculoskeletal: He exhibits no edema and no tenderness.  Neurological: He displays normal reflexes. No cranial nerve deficit. Coordination normal.  Skin: No rash noted. No erythema. No pallor.  Psychiatric: He has a normal mood and affect. His behavior is normal. Judgment and thought content normal.      Labs reviewed: CBC: @LAB1YEARLOOKBACK (WBC:3, HGB:3, MCV:3, PLATELET:3) Basic Metabolic Panel: ) Recent Labs  04/11/13 0946 07/12/13 0843  NA 141 140  K 4.1 3.9  CL 102 102  CO2 25 24  GLUCOSE 91 96  BUN 16 16  CREATININE 1.12 1.11  CALCIUM 8.9 9.2   Liver Function Tests:  Recent Labs  04/11/13 0946 07/12/13 0843  AST 27 24  ALT 17 16  ALKPHOS 48 49  BILITOT 1.4* 1.2  PROT 6.0 6.1    Lab Results  Component Value Date   HGBA1C  Value: 6.0 (NOTE)                                                                       According to the ADA Clinical Practice Recommendations for 2011, when HbA1c is used as a screening test:   >=6.5%   Diagnostic of Diabetes Mellitus           (if abnormal result  is confirmed)  5.7-6.4%   Increased risk of developing Diabetes Mellitus  References:Diagnosis and Classification of Diabetes Mellitus,Diabetes Care,2011,34(Suppl 1):S62-S69 and Standards of Medical Care in         Diabetes - 2011,Diabetes Care,2011,34  (Suppl 1):S11-S61.* 01/17/2011   Lipid Panel:  Recent Labs  04/11/13 0946 07/12/13 0843  HDL 53 54  LDLCALC 96 91  TRIG 70 72  CHOLHDL 3.1 2.9    Assessment/Plan Symptomatic cholelithiasis: Surgery is planned for this problem  Unspecified essential hypertension : Controlled  Other and unspecified hyperlipidemia: Satisfactorily controlled  Atrial premature complexes : Patient is  aware, but is not symptomatic  Hypertrophy of prostate without urinary obstruction and other lower urinary tract symptoms (LUTS) - Plan: PSA  Anxiety state, unspecified: Continue alprazolam as needed

## 2013-11-01 ENCOUNTER — Encounter (INDEPENDENT_AMBULATORY_CARE_PROVIDER_SITE_OTHER): Payer: Self-pay | Admitting: General Surgery

## 2013-11-01 ENCOUNTER — Encounter (INDEPENDENT_AMBULATORY_CARE_PROVIDER_SITE_OTHER): Payer: Self-pay

## 2013-11-01 ENCOUNTER — Ambulatory Visit (INDEPENDENT_AMBULATORY_CARE_PROVIDER_SITE_OTHER): Payer: Medicare Other | Admitting: General Surgery

## 2013-11-01 VITALS — BP 124/70 | HR 84 | Temp 99.1°F | Resp 15 | Ht 70.0 in | Wt 191.0 lb

## 2013-11-01 DIAGNOSIS — K8020 Calculus of gallbladder without cholecystitis without obstruction: Secondary | ICD-10-CM

## 2013-11-01 NOTE — Patient Instructions (Signed)
Stop Aspirin 5 days before surgery.  We will call and schedule your surgery after we hear from Dr. Katrinka Blazing.    CCS ______[facility] LAPAROSCOPIC SURGERY: POST OP INSTRUCTIONS Always review your discharge instruction sheet given to you by the facility where your surgery was performed. IF YOU HAVE DISABILITY OR FAMILY LEAVE FORMS, YOU MUST BRING THEM TO THE OFFICE FOR PROCESSING.   DO NOT GIVE THEM TO YOUR DOCTOR.  1. A prescription for pain medication may be given to you upon discharge.  Take your pain medication as prescribed, if needed.  If narcotic pain medicine is not needed, then you may take acetaminophen (Tylenol) or ibuprofen (Advil) as needed. 2. Take your usually prescribed medications unless otherwise directed. 3. If you need a refill on your pain medication, please contact your pharmacy.  They will contact our office to request authorization. Prescriptions will not be filled after 5pm or on week-ends. 4. You should follow a light diet the first few days after arrival home, such as soup and crackers, etc.  Be sure to include lots of fluids daily. 5. Most patients will experience some swelling and bruising in the area of the incisions.  Ice packs will help.  Swelling and bruising can take several days to resolve.  6. It is common to experience some constipation if taking pain medication after surgery.  Increasing fluid intake and taking a stool softener (such as Colace) will usually help or prevent this problem from occurring.  A mild laxative (Milk of Magnesia or Miralax) should be taken according to package instructions if there are no bowel movements after 48 hours. 7. Unless discharge instructions indicate otherwise, you may remove your bandages 24-48 hours after surgery, and you may shower at that time.  You may have steri-strips (small skin tapes) in place directly over the incision.  These strips should be left on the skin for 7-10 days.  If your surgeon used skin  glue on the incision, you may shower in 24 hours.  The glue will flake off over the next 2-3 weeks.  Any sutures or staples will be removed at the office during your follow-up visit. 8. ACTIVITIES:  You may resume regular (light) daily activities beginning the next day-such as daily self-care, walking, climbing stairs-gradually increasing activities as tolerated.  You may have sexual intercourse when it is comfortable.  Refrain from any heavy lifting or straining until approved by your doctor. a. You may drive when you are no longer taking prescription pain medication, you can comfortably wear a seatbelt, and you can safely maneuver your car and apply brakes. b. RETURN TO WORK:  __________________________________________________________ 9. You should see your doctor in the office for a follow-up appointment approximately 2-3 weeks after your surgery.  Make sure that you call for this appointment within a day or two after you arrive home to insure a convenient appointment time. 10. OTHER INSTRUCTIONS: __________________________________________________________________________________________________________________________ __________________________________________________________________________________________________________________________ WHEN TO CALL YOUR DOCTOR: 1. Fever over 101.0 2. Inability to urinate 3. Continued bleeding from incision. 4. Increased pain, redness, or drainage from the incision. 5. Increasing abdominal pain  The clinic staff is available to answer your questions during regular business hours.  Please don't hesitate to call and ask to speak to one of the nurses for clinical concerns.  If you have a medical emergency, go to the nearest emergency room or call 911.  A surgeon from [facility] is always on call at the hospital. [address],  [place], [place]  [address] ? [address] [place], [place],    [address] [phone] ? [phone] ? FAX  [phone] Web site: [email or web address]

## 2013-11-01 NOTE — Progress Notes (Addendum)
Patient ID: Alexander [name], male   DOB: Jun 21, 1936, 77 y.o.   MRN: [id number]  Chief Complaint  Patient presents with  . New Evaluation    eval and recheck gb / schedule sx    HPI DONALDSON RICHTER is a 77 y.o. male.   HPI  He is well known to me. I saw him a little over 2 years ago because of symptomatic cholelithiasis. He was quite deconditioned at that time following his open heart surgery. He subsequently has been feeling a lot better. Recently however he has had a couple cases of biliary colic. Ultrasound again demonstrates cholelithiasis. Recent liver function tests from October are within normal limits. He's been sent over here by Dr. Juanda Chance to discuss cholecystectomy.  Past Medical History  Diagnosis Date  . Pneumonia   . Pulmonary embolism 2012    s/p heart surgery  . Atrial fibrillation   . Heart attack   . Asthma   . Shortness of breath   . Hyperlipidemia   . Dysuria   . Blood transfusion   . Clotting disorder   . Difficulty urinating   . Rheumatic fever   . Family hx of alcoholism   . History of anal fissures   . Skin cancer     Hx of   . Anxiety   . Sleep apnea     no CPAP  . Hemorrhoids   . Diverticulosis of colon (without mention of hemorrhage)   . Internal hemorrhoids without mention of complication   . Allergic rhinitis due to pollen   . Basal cell carcinoma of skin of lip   . History of cholelithiasis   . Edema   . Nonspecific elevation of levels of transaminase or lactic acid dehydrogenase (LDH)   . Long term (current) use of anticoagulants   . Other abnormal blood chemistry   . Other voice and resonance disorders   . Coronary atherosclerosis of native coronary artery   . COPD (chronic obstructive pulmonary disease)   . Hypertrophy of prostate with urinary obstruction and other lower urinary tract symptoms (LUTS)   . Hypertension   . Impotence of organic origin   . Unspecified constipation   . Anal fissure   . Blood transfusion without reported  diagnosis     Past Surgical History  Procedure Laterality Date  . Cardiac surgery    . Hernia repair  2010    bilateral  . Coronary artery bypass graft  Febuary 13, 2012    full maze   . Scrotum exploration      multiple  . Mohs surgery      squamus   . Hip surgery      tumor removed from left hip   . Vasectomy  04/1992  . Superficial skin cystectomy (l) hip  1997    dr Logan Bores  . Hydrocele excision      dr Annabell Howells   . Bilateral inguinal herniorrhaphy w/mesh  2009    dr Abbey Chatters  . Midian sterotomy,coronary artery bypass graft 3  01/20/2011  . Spermatocelectomy  02/1999    Right, Dr.[name]    Family History  Problem Relation Age of Onset  . Bone cancer Mother   . Diabetes Mother   . Colon cancer Neg Hx   . Esophageal cancer Neg Hx   . Rectal cancer Neg Hx   . Stomach cancer Neg Hx     Social History History  Substance Use Topics  . Smoking status: Former Smoker  Types: Cigarettes    Quit date: 12/08/1977  . Smokeless tobacco: Never Used  . Alcohol Use: Yes     Comment: former heavy alcohol use, rare use    Allergies  Allergen Reactions  . Aspirin     Breathing complications, able to take three times a week  . Codeine   . Desyrel [Trazodone]   . Heparin     Blood clots   . Oxycodone   . Plavix [Clopidogrel Bisulfate]   . Zocor [Simvastatin]   . Zoloft [Sertraline Hcl]     Current Outpatient Prescriptions  Medication Sig Dispense Refill  . albuterol (PROVENTIL HFA;VENTOLIN HFA) 108 (90 BASE) MCG/ACT inhaler Inhale 2 puffs into the lungs every 6 (six) hours as needed for wheezing.  1 Inhaler  0  . ALPRAZolam (XANAX) 0.5 MG tablet Take 1 tablet (0.5 mg total) by mouth as directed.  30 tablet  1  . amitriptyline (ELAVIL) 25 MG tablet Take 1 tablet (25 mg total) by mouth at bedtime. Pt takes 1/4 tablet qhs  90 tablet  0  . aspirin 81 MG tablet Take 81 mg by mouth daily.        . Cholecalciferol (VITAMIN D PO) Take by mouth. Takes 800 iu as directed       . cyclobenzaprine (FLEXERIL) 10 MG tablet Take one tablet 3 times a day to relax muscles  30 tablet  5  . diazepam (VALIUM) 5 MG tablet One up to to 4 times daily for anxiety  30 tablet  0  . dicyclomine (BENTYL) 10 MG capsule       . Flaxseed, Linseed, 1000 MG CAPS Take 1 capsule by mouth daily.      . Multiple Vitamins-Minerals (CENTRUM SILVER PO) Take 1 capsule by mouth daily.      . rosuvastatin (CRESTOR) 10 MG tablet Take 5 mg by mouth daily.       . sulindac (CLINORIL) 200 MG tablet Takes 100 mg by mouth four times a week       No current facility-administered medications for this visit.    Review of Systems Review of Systems  Constitutional: Negative for fever and chills.  Respiratory: Negative.   Cardiovascular: Negative.   Gastrointestinal: Positive for abdominal pain and diarrhea. Negative for nausea.  Genitourinary: Negative.   Hematological: Negative.     Blood pressure 124/70, pulse 84, temperature 99.1 F (37.3 C), temperature source Temporal, resp. rate 15, height 5\' 10"  (1.778 m), weight 191 lb (86.637 kg).  Physical Exam Physical Exam  Constitutional: He appears well-developed and well-nourished. No distress.  HENT:  Head: Normocephalic and atraumatic.  Eyes: EOM are normal. No scleral icterus.  Neck: Neck supple.  Cardiovascular: Normal rate and regular rhythm.   Pulmonary/Chest: Effort normal and breath sounds normal.  midsternal scar.  Abdominal: Soft. He exhibits no distension and no mass. There is no tenderness.  Musculoskeletal: He exhibits no edema.  Lymphadenopathy:    He has no cervical adenopathy.  Neurological: He is alert.  Skin: Skin is warm and dry.  Psychiatric: He has a normal mood and affect. His behavior is normal.    Data Reviewed Ultrasound report from [facility]. Liver function tests from [facility]. Note from Dr. Juanda Chance  Assessment    Symptomatic cholelithiasis.     Plan    Obtain cardiac clearance from Dr. Garnette Scheuermann.  Once this is obtained, will schedule laparoscopic possible open cholecystectomy.  I have explained the procedure, risks, and aftercare of cholecystectomy.  Risks include but are not limited to bleeding, infection, wound problems, anesthesia, diarrhea, bile leak, injury to common bile duct/liver/intestine.  He seems to understand and agrees to proceed.        Reubin Bushnell J 11/01/2013, 10:00 AM   Dr. Katrinka Blazing feels that as long as he does not have any cardiac symptoms, he is low risk and is okay to have a laparoscopic cholecystectomy. Mr. Jasinski is symptom-free and is quite active. Will work on scheduling the operation. This has been discussed with him.

## 2013-11-02 NOTE — Addendum Note (Signed)
Addended by: Adolph Pollack on: 11/02/2013 10:29 AM   Modules accepted: Orders

## 2013-11-11 ENCOUNTER — Encounter (HOSPITAL_COMMUNITY): Payer: Self-pay | Admitting: Pharmacy Technician

## 2013-11-11 ENCOUNTER — Other Ambulatory Visit: Payer: Self-pay | Admitting: Dermatology

## 2013-11-15 ENCOUNTER — Encounter (HOSPITAL_COMMUNITY)
Admission: RE | Admit: 2013-11-15 | Discharge: 2013-11-15 | Disposition: A | Discharge status: Home / Self Care | Payer: Medicare Other | Source: Ambulatory Visit | Attending: General Surgery | Admitting: General Surgery

## 2013-11-15 ENCOUNTER — Ambulatory Visit (HOSPITAL_COMMUNITY)
Admission: RE | Admit: 2013-11-15 | Discharge: 2013-11-15 | Disposition: A | Discharge status: Home / Self Care | Payer: Medicare Other | Source: Ambulatory Visit | Attending: General Surgery | Admitting: General Surgery

## 2013-11-15 ENCOUNTER — Encounter (HOSPITAL_COMMUNITY): Payer: Self-pay

## 2013-11-15 DIAGNOSIS — Z01818 Encounter for other preprocedural examination: Secondary | ICD-10-CM | POA: Insufficient documentation

## 2013-11-15 DIAGNOSIS — K8020 Calculus of gallbladder without cholecystitis without obstruction: Secondary | ICD-10-CM | POA: Insufficient documentation

## 2013-11-15 DIAGNOSIS — Z951 Presence of aortocoronary bypass graft: Secondary | ICD-10-CM | POA: Insufficient documentation

## 2013-11-15 DIAGNOSIS — Z01812 Encounter for preprocedural laboratory examination: Secondary | ICD-10-CM | POA: Insufficient documentation

## 2013-11-15 HISTORY — DX: Unspecified jaundice: R17

## 2013-11-15 HISTORY — DX: Angina pectoris, unspecified: I20.9

## 2013-11-15 LAB — CBC WITH DIFFERENTIAL/PLATELET
Basophils Absolute: 0 10*3/uL (ref 0.0–0.1)
HCT: 46 % (ref 39.0–52.0)
Hemoglobin: 15.7 g/dL (ref 13.0–17.0)
Lymphocytes Relative: 22 % (ref 12–46)
Monocytes Absolute: 0.5 10*3/uL (ref 0.1–1.0)
Monocytes Relative: 7 % (ref 3–12)
Neutro Abs: 4.8 10*3/uL (ref 1.7–7.7)
Platelets: 236 10*3/uL (ref 150–400)
RDW: 12.6 % (ref 11.5–15.5)
WBC: 6.9 10*3/uL (ref 4.0–10.5)

## 2013-11-15 LAB — COMPREHENSIVE METABOLIC PANEL
ALT: 20 U/L (ref 0–53)
AST: 25 U/L (ref 0–37)
Alkaline Phosphatase: 55 U/L (ref 39–117)
CO2: 29 mEq/L (ref 19–32)
Chloride: 101 mEq/L (ref 96–112)
Creatinine, Ser: 1.08 mg/dL (ref 0.50–1.35)
GFR calc non Af Amer: 65 mL/min — ABNORMAL LOW (ref >90)
Sodium: 138 mEq/L (ref 135–145)
Total Bilirubin: 1 mg/dL (ref 0.3–1.2)
Total Protein: 7.1 g/dL (ref 6.0–8.3)

## 2013-11-15 NOTE — Progress Notes (Signed)
EKG 04/13/13 on EPIC and 08/01/13 on chart, LOV note 12/21/12 Dr. Verdis Prime on chart

## 2013-11-15 NOTE — Patient Instructions (Addendum)
Alexander [name]  11/15/2013   Your procedure is scheduled on: 11/18/13  Report to [facility] at 10:15 AM.  Call this number if you have problems the morning of surgery [phone]: [phone]   Remember:   Do not eat food or drink liquids After Midnight.     Take these medicines the morning of surgery with A SIP OF WATER: albuterol   Do not wear jewelry, make-up or nail polish.  Do not wear lotions, powders, or perfumes. You may wear deodorant.  Do not shave 48 hours prior to surgery. Men may shave face and neck.  Do not bring valuables to the hospital.  Contacts, dentures or bridgework may not be worn into surgery.   Patients discharged the day of surgery will not be allowed to drive home.  Name and phone number of your driver:[name],[name] [phone]  Birdie Sons, RN  pre op nurse call if needed [phone]    FAILURE TO FOLLOW THESE INSTRUCTIONS MAY RESULT IN CANCELLATION OF YOUR SURGERY   Patient Signature: ___________________________________________

## 2013-11-16 NOTE — Progress Notes (Addendum)
Patient called today and wanted to know if Stress Test from [place] had been received.  Stress Test from [place] not received and informed patient.  [facility].  Placed on chart.  Patient asked the results of the stress test.  Informed patient that I could not give to him the results of the stress test and that the MD would have to give him the results.  Patient then asked if he could take Bentyl the morning of surgery.  Patient states he takes this medication on occasion .  Informed patient that he could take Bentyl am of surgery with sip of water.  Placed Bentyl on Home Medications.

## 2013-11-18 ENCOUNTER — Encounter (HOSPITAL_COMMUNITY): Payer: Self-pay | Admitting: Anesthesiology

## 2013-11-18 ENCOUNTER — Ambulatory Visit (HOSPITAL_COMMUNITY)
Admission: RE | Admit: 2013-11-18 | Discharge: 2013-11-18 | Disposition: A | Discharge status: Home / Self Care | Payer: Medicare Other | Source: Ambulatory Visit | Attending: General Surgery | Admitting: General Surgery

## 2013-11-18 ENCOUNTER — Encounter (HOSPITAL_COMMUNITY)
Admission: RE | Disposition: A | Discharge status: Home / Self Care | Payer: Self-pay | Source: Ambulatory Visit | Attending: General Surgery

## 2013-11-18 ENCOUNTER — Encounter (HOSPITAL_COMMUNITY): Payer: Medicare Other | Admitting: Anesthesiology

## 2013-11-18 ENCOUNTER — Ambulatory Visit (HOSPITAL_COMMUNITY): Payer: Medicare Other | Admitting: Anesthesiology

## 2013-11-18 ENCOUNTER — Ambulatory Visit (HOSPITAL_COMMUNITY): Payer: Medicare Other

## 2013-11-18 DIAGNOSIS — K8010 Calculus of gallbladder with chronic cholecystitis without obstruction: Secondary | ICD-10-CM

## 2013-11-18 DIAGNOSIS — J4489 Other specified chronic obstructive pulmonary disease: Secondary | ICD-10-CM | POA: Insufficient documentation

## 2013-11-18 DIAGNOSIS — I2510 Atherosclerotic heart disease of native coronary artery without angina pectoris: Secondary | ICD-10-CM | POA: Insufficient documentation

## 2013-11-18 DIAGNOSIS — Z79899 Other long term (current) drug therapy: Secondary | ICD-10-CM | POA: Insufficient documentation

## 2013-11-18 DIAGNOSIS — I10 Essential (primary) hypertension: Secondary | ICD-10-CM | POA: Insufficient documentation

## 2013-11-18 DIAGNOSIS — E785 Hyperlipidemia, unspecified: Secondary | ICD-10-CM | POA: Insufficient documentation

## 2013-11-18 DIAGNOSIS — Z86711 Personal history of pulmonary embolism: Secondary | ICD-10-CM | POA: Insufficient documentation

## 2013-11-18 DIAGNOSIS — I252 Old myocardial infarction: Secondary | ICD-10-CM | POA: Insufficient documentation

## 2013-11-18 DIAGNOSIS — Z951 Presence of aortocoronary bypass graft: Secondary | ICD-10-CM | POA: Insufficient documentation

## 2013-11-18 DIAGNOSIS — G4730 Sleep apnea, unspecified: Secondary | ICD-10-CM | POA: Insufficient documentation

## 2013-11-18 DIAGNOSIS — J449 Chronic obstructive pulmonary disease, unspecified: Secondary | ICD-10-CM | POA: Insufficient documentation

## 2013-11-18 HISTORY — PX: CHOLECYSTECTOMY: SHX55

## 2013-11-18 SURGERY — LAPAROSCOPIC CHOLECYSTECTOMY WITH INTRAOPERATIVE CHOLANGIOGRAM
Anesthesia: General | Site: Abdomen

## 2013-11-18 MED ORDER — CEFAZOLIN SODIUM-DEXTROSE 2-3 GM-% IV SOLR: 2.0000 g | INTRAVENOUS | Status: DC

## 2013-11-18 MED ORDER — MIDAZOLAM HCL 2 MG/2ML IJ SOLN
INTRAMUSCULAR | Status: AC
  Filled 2013-11-18: qty 2

## 2013-11-18 MED ORDER — MORPHINE SULFATE 10 MG/ML IJ SOLN: 2.0000 mg | INTRAMUSCULAR | Status: DC | PRN

## 2013-11-18 MED ORDER — SODIUM CHLORIDE 0.9 % IJ SOLN: 3.0000 mL | INTRAMUSCULAR | Status: DC | PRN

## 2013-11-18 MED ORDER — LACTATED RINGERS IV SOLN
INTRAVENOUS | Status: DC | PRN
  Administered 2013-11-18: 1000 mL

## 2013-11-18 MED ORDER — CIPROFLOXACIN IN D5W 400 MG/200ML IV SOLN
INTRAVENOUS | Status: DC | PRN
  Administered 2013-11-18: 400 mg via INTRAVENOUS

## 2013-11-18 MED ORDER — PROMETHAZINE HCL 25 MG/ML IJ SOLN: 6.2500 mg | INTRAMUSCULAR | Status: DC | PRN

## 2013-11-18 MED ORDER — HYDROCODONE-ACETAMINOPHEN 5-325 MG PO TABS: 1.0000 | ORAL_TABLET | Freq: Four times a day (QID) | ORAL | Status: DC | PRN

## 2013-11-18 MED ORDER — ROCURONIUM BROMIDE 100 MG/10ML IV SOLN
INTRAVENOUS | Status: AC
  Filled 2013-11-18: qty 1

## 2013-11-18 MED ORDER — IOHEXOL 300 MG/ML  SOLN
INTRAMUSCULAR | Status: DC | PRN
  Administered 2013-11-18: 8 mL via INTRAVENOUS

## 2013-11-18 MED ORDER — GLYCOPYRROLATE 0.2 MG/ML IJ SOLN
INTRAMUSCULAR | Status: AC
  Filled 2013-11-18: qty 2

## 2013-11-18 MED ORDER — HYDROMORPHONE HCL PF 1 MG/ML IJ SOLN: 0.2500 mg | INTRAMUSCULAR | Status: DC | PRN

## 2013-11-18 MED ORDER — NEOSTIGMINE METHYLSULFATE 1 MG/ML IJ SOLN
INTRAMUSCULAR | Status: DC | PRN
  Administered 2013-11-18: 5 mg via INTRAVENOUS

## 2013-11-18 MED ORDER — HYDROCODONE-ACETAMINOPHEN 7.5-325 MG/15ML PO SOLN
10.0000 mL | Freq: Once | ORAL | Status: AC
  Administered 2013-11-18: 15 mL via ORAL
  Filled 2013-11-18: qty 15

## 2013-11-18 MED ORDER — ROCURONIUM BROMIDE 100 MG/10ML IV SOLN
INTRAVENOUS | Status: DC | PRN
  Administered 2013-11-18: 10 mg via INTRAVENOUS
  Administered 2013-11-18: 40 mg via INTRAVENOUS
  Administered 2013-11-18: 10 mg via INTRAVENOUS

## 2013-11-18 MED ORDER — MIDAZOLAM HCL 5 MG/5ML IJ SOLN
INTRAMUSCULAR | Status: DC | PRN
  Administered 2013-11-18 (×2): 1 mg via INTRAVENOUS

## 2013-11-18 MED ORDER — LACTATED RINGERS IV SOLN: INTRAVENOUS | Status: DC

## 2013-11-18 MED ORDER — ONDANSETRON HCL 4 MG/2ML IJ SOLN: 4.0000 mg | INTRAMUSCULAR | Status: DC | PRN

## 2013-11-18 MED ORDER — CIPROFLOXACIN IN D5W 400 MG/200ML IV SOLN
INTRAVENOUS | Status: AC
  Filled 2013-11-18: qty 200

## 2013-11-18 MED ORDER — ONDANSETRON HCL 4 MG/2ML IJ SOLN
INTRAMUSCULAR | Status: DC | PRN
  Administered 2013-11-18: 4 mg via INTRAVENOUS

## 2013-11-18 MED ORDER — LIDOCAINE HCL (CARDIAC) 20 MG/ML IV SOLN
INTRAVENOUS | Status: AC
  Filled 2013-11-18: qty 5

## 2013-11-18 MED ORDER — LACTATED RINGERS IV SOLN
INTRAVENOUS | Status: DC | PRN
  Administered 2013-11-18: 12:00:00 via INTRAVENOUS

## 2013-11-18 MED ORDER — CEFAZOLIN SODIUM-DEXTROSE 2-3 GM-% IV SOLR
INTRAVENOUS | Status: AC
  Filled 2013-11-18: qty 50

## 2013-11-18 MED ORDER — PROPOFOL 10 MG/ML IV BOLUS
INTRAVENOUS | Status: AC
  Filled 2013-11-18: qty 20

## 2013-11-18 MED ORDER — PHENYLEPHRINE HCL 10 MG/ML IJ SOLN
INTRAMUSCULAR | Status: DC | PRN
  Administered 2013-11-18: 120 ug via INTRAVENOUS
  Administered 2013-11-18 (×2): 40 ug via INTRAVENOUS
  Administered 2013-11-18: 120 ug via INTRAVENOUS
  Administered 2013-11-18 (×2): 80 ug via INTRAVENOUS

## 2013-11-18 MED ORDER — LIDOCAINE HCL (CARDIAC) 20 MG/ML IV SOLN
INTRAVENOUS | Status: DC | PRN
  Administered 2013-11-18 (×2): 75 mg via INTRAVENOUS

## 2013-11-18 MED ORDER — ACETAMINOPHEN 650 MG RE SUPP
650.0000 mg | RECTAL | Status: DC | PRN
  Filled 2013-11-18: qty 1

## 2013-11-18 MED ORDER — HYDROMORPHONE HCL PF 1 MG/ML IJ SOLN: INTRAMUSCULAR | Status: DC | PRN

## 2013-11-18 MED ORDER — ACETAMINOPHEN 325 MG PO TABS: 650.0000 mg | ORAL_TABLET | ORAL | Status: DC | PRN

## 2013-11-18 MED ORDER — BUPIVACAINE HCL 0.5 % IJ SOLN
INTRAMUSCULAR | Status: DC | PRN
  Administered 2013-11-18: 20 mL

## 2013-11-18 MED ORDER — ONDANSETRON HCL 4 MG/2ML IJ SOLN
INTRAMUSCULAR | Status: AC
  Filled 2013-11-18: qty 2

## 2013-11-18 MED ORDER — LACTATED RINGERS IV SOLN: INTRAVENOUS | Status: DC | PRN

## 2013-11-18 MED ORDER — 0.9 % SODIUM CHLORIDE (POUR BTL) OPTIME
TOPICAL | Status: DC | PRN
  Administered 2013-11-18: 1000 mL

## 2013-11-18 MED ORDER — FENTANYL CITRATE 0.05 MG/ML IJ SOLN
INTRAMUSCULAR | Status: DC | PRN
  Administered 2013-11-18 (×5): 50 ug via INTRAVENOUS

## 2013-11-18 MED ORDER — EPHEDRINE SULFATE 50 MG/ML IJ SOLN
INTRAMUSCULAR | Status: DC | PRN
  Administered 2013-11-18 (×2): 5 mg via INTRAVENOUS
  Administered 2013-11-18: 10 mg via INTRAVENOUS

## 2013-11-18 MED ORDER — PHENYLEPHRINE 40 MCG/ML (10ML) SYRINGE FOR IV PUSH (FOR BLOOD PRESSURE SUPPORT)
PREFILLED_SYRINGE | INTRAVENOUS | Status: AC
  Filled 2013-11-18: qty 20

## 2013-11-18 MED ORDER — NEOSTIGMINE METHYLSULFATE 1 MG/ML IJ SOLN
INTRAMUSCULAR | Status: AC
  Filled 2013-11-18: qty 10

## 2013-11-18 MED ORDER — PROPOFOL 10 MG/ML IV BOLUS
INTRAVENOUS | Status: DC | PRN
  Administered 2013-11-18: 140 mg via INTRAVENOUS

## 2013-11-18 MED ORDER — BUPIVACAINE HCL (PF) 0.5 % IJ SOLN
INTRAMUSCULAR | Status: AC
  Filled 2013-11-18: qty 30

## 2013-11-18 MED ORDER — DEXTROSE IN LACTATED RINGERS 5 % IV SOLN: INTRAVENOUS | Status: DC

## 2013-11-18 MED ORDER — GLYCOPYRROLATE 0.2 MG/ML IJ SOLN
INTRAMUSCULAR | Status: DC | PRN
  Administered 2013-11-18: .8 mg via INTRAVENOUS

## 2013-11-18 MED ORDER — FENTANYL CITRATE 0.05 MG/ML IJ SOLN
INTRAMUSCULAR | Status: AC
  Filled 2013-11-18: qty 5

## 2013-11-18 SURGICAL SUPPLY — 37 items
APPLIER CLIP 5 13 M/L LIGAMAX5 (MISCELLANEOUS) ×4
BENZOIN TINCTURE PRP APPL 2/3 (GAUZE/BANDAGES/DRESSINGS) ×2 IMPLANT
CHLORAPREP W/TINT 26ML (MISCELLANEOUS) ×2 IMPLANT
CLIP APPLIE 5 13 M/L LIGAMAX5 (MISCELLANEOUS) ×2 IMPLANT
COVER MAYO STAND STRL (DRAPES) ×2 IMPLANT
DECANTER SPIKE VIAL GLASS SM (MISCELLANEOUS) ×2 IMPLANT
DISSECTOR BLUNT TIP ENDO 5MM (MISCELLANEOUS) ×2 IMPLANT
DRAPE C-ARM 42X120 X-RAY (DRAPES) ×2 IMPLANT
DRAPE LAPAROSCOPIC ABDOMINAL (DRAPES) ×2 IMPLANT
DRAPE UTILITY XL STRL (DRAPES) ×2 IMPLANT
DRSG TEGADERM 2-3/8X2-3/4 SM (GAUZE/BANDAGES/DRESSINGS) IMPLANT
ELECT REM PT RETURN 9FT ADLT (ELECTROSURGICAL) ×2
ELECTRODE REM PT RTRN 9FT ADLT (ELECTROSURGICAL) ×1 IMPLANT
ENDOLOOP SUT PDS II  0 18 (SUTURE)
ENDOLOOP SUT PDS II 0 18 (SUTURE) IMPLANT
GAUZE SPONGE 2X2 8PLY STRL LF (GAUZE/BANDAGES/DRESSINGS) IMPLANT
GLOVE ECLIPSE 8.0 STRL XLNG CF (GLOVE) ×2 IMPLANT
GLOVE INDICATOR 8.0 STRL GRN (GLOVE) ×2 IMPLANT
GOWN STRL REIN XL XLG (GOWN DISPOSABLE) ×6 IMPLANT
HEMOSTAT SNOW SURGICEL 2X4 (HEMOSTASIS) ×2 IMPLANT
KIT BASIN OR (CUSTOM PROCEDURE TRAY) ×2 IMPLANT
POUCH SPECIMEN RETRIEVAL 10MM (ENDOMECHANICALS) ×2 IMPLANT
SCISSORS LAP 5X35 DISP (ENDOMECHANICALS) ×2 IMPLANT
SET CHOLANGIOGRAPH MIX (MISCELLANEOUS) ×2 IMPLANT
SET IRRIG TUBING LAPAROSCOPIC (IRRIGATION / IRRIGATOR) ×2 IMPLANT
SLEEVE XCEL OPT CAN 5 100 (ENDOMECHANICALS) ×4 IMPLANT
SOLUTION ANTI FOG 6CC (MISCELLANEOUS) ×2 IMPLANT
SPONGE GAUZE 2X2 STER 10/PKG (GAUZE/BANDAGES/DRESSINGS)
STRIP CLOSURE SKIN 1/2X4 (GAUZE/BANDAGES/DRESSINGS) IMPLANT
SUT MNCRL AB 4-0 PS2 18 (SUTURE) ×2 IMPLANT
TOWEL OR 17X26 10 PK STRL BLUE (TOWEL DISPOSABLE) ×2 IMPLANT
TOWEL OR NON WOVEN STRL DISP B (DISPOSABLE) ×2 IMPLANT
TRAY LAP CHOLE (CUSTOM PROCEDURE TRAY) ×2 IMPLANT
TROCAR BLADELESS OPT 5 100 (ENDOMECHANICALS) ×2 IMPLANT
TROCAR XCEL BLUNT TIP 100MML (ENDOMECHANICALS) ×2 IMPLANT
TROCAR XCEL NON-BLD 11X100MML (ENDOMECHANICALS) IMPLANT
TUBING INSUFFLATION 10FT LAP (TUBING) ×2 IMPLANT

## 2013-11-18 NOTE — Anesthesia Preprocedure Evaluation (Signed)
Anesthesia Evaluation  Patient identified by MRN, date of birth, ID band Patient awake    Reviewed: Allergy & Precautions, H&P , NPO status , Patient's Chart, lab work & pertinent test results  Airway Mallampati: II TM Distance: >3 FB Neck ROM: Full    Dental  (+) Edentulous Upper and Dental Advisory Given   Pulmonary asthma , sleep apnea , pneumonia -, former smoker, PE breath sounds clear to auscultation  Pulmonary exam normal       Cardiovascular hypertension, - angina+ CAD, + Past MI and + CABG + dysrhythmias Rhythm:Regular Rate:Normal     Neuro/Psych Anxiety negative neurological ROS     GI/Hepatic negative GI ROS, Neg liver ROS,   Endo/Other  negative endocrine ROS  Renal/GU negative Renal ROS  negative genitourinary   Musculoskeletal negative musculoskeletal ROS (+)   Abdominal   Peds  Hematology negative hematology ROS (+)   Anesthesia Other Findings   Reproductive/Obstetrics                           Anesthesia Physical Anesthesia Plan  ASA: III  Anesthesia Plan: General   Post-op Pain Management:    Induction: Intravenous  Airway Management Planned: Oral ETT  Additional Equipment:   Intra-op Plan:   Post-operative Plan: Extubation in OR  Informed Consent: I have reviewed the patients History and Physical, chart, labs and discussed the procedure including the risks, benefits and alternatives for the proposed anesthesia with the patient or authorized representative who has indicated his/her understanding and acceptance.   Dental advisory given  Plan Discussed with: CRNA  Anesthesia Plan Comments:         Anesthesia Quick Evaluation

## 2013-11-18 NOTE — Progress Notes (Signed)
Report To Jacquline.

## 2013-11-18 NOTE — Op Note (Signed)
Operative Note  Alexander [name] male 77 y.o. 11/18/2013  PREOPERATIVE DX:  Symptomatic cholelithiasis  POSTOPERATIVE DX:  Same with chronic cholecystitis  PROCEDURE:  Laparoscopic cholecystectomy with intraoperative cholangiogram         Surgeon: Adolph Pollack   Assistants: Romie Levee M.D.  Anesthesia: General endotracheal anesthesia  Indications:  This is a 77 year old man who said symptomatic cholelithiasis on-and-off for 2 years. He now presents for elective laparoscopic cholecystectomy. Cardiac clearance has been obtained. The procedure, risks, and after care were discussed with him preoperatively.    Procedure Detail:  He was brought to the operating room placed supine on the operating table and a general anesthetic was given. The hair on the abdominal wall was clipped. The abdominal wall was widely sterilely prepped and draped.  Marcaine solution was infiltrated into the subcutaneous tissues of the subumbilical area. An incision was made through previous subumbilical scar and carried down to the midline fascia is identified. A small incision was made in the midline fascia and peritoneum. The peritoneal cavity was entered under direct vision. A pursestring suture of 0 Vicryl was placed around the edges of the fascia. A Hassan trocar was introduced into the peritoneal cavity and a pneumoperitoneum created by insufflation of CO2 gas. The laparoscope was then introduced and there was no evidence of underlying organ injury or bleeding.  He was placed in reverse Trendelenburg position with right side tilted slightly up. A 5 mm trocar was placed in the epigastric area. Two 5 mm trocars were placed in the right upper quadrant.  There were omental adhesions to the liver and the fundus area of the gallbladder. These were divided with electrocautery and careful blunt dissection. There were omental adhesions to the majority of the gallbladder. There is also one adhesion between the  transverse colon and the gallbladder which was mobilized bluntly. I then began freeing up the gallbladder.  There were dense adhesions between the liver and omentum and the gallbladder. These were mobilized both bluntly and with electrocautery close to the gallbladder.  The gallbladder was small and contracted. There were chronic inflammatory changes noted.  I then identified the cystic duct. A window was created around it. A clip was placed at the cystic duct gallbladder junction.  A small incision was made in the cystic duct and bile was milked back. A cholangiocatheter was passed through the anterior abdominal wall and placed into the cystic duct. A cholangiogram was performed.  Under real-time fluoroscopy dilute contrast was injected into the cystic duct. The common hepatic duct, right and left hepatic ducts, the common bile ducts all filled promptly. Contrast drained into the duodenum without obvious evidence of obstruction. Final report is pending the radiologist interpretation.  The cholangiocatheter was then removed and the cystic duct was clipped 3 times on the biliary side and divided. Using blunt dissection, I further mobilized the gallbladder and identified the cystic artery. A window was created around it. It was clipped and divided. I then dissected the gallbladder free from the liver using electrocautery. There was one area which looked like it could be a small duct of Luschka . I placed a clip on this and divided it.  The fundus of the gallbladder was intrahepatic so I dissected free from liver. Some bleeding ensued from this area but this was controlled with electrocautery. The gallbladder is then placed in the retrieval bag and removed via the subumbilical port.  The gallbladder fossa was then copiously irrigated and bleeding points controlled electrocautery. Further  inspection demonstrated no evidence of intestinal injury, bile leak, or bleeding. A piece of Surgicel was placed in the  gallbladder fossa. Irrigation fluid was evacuated.  The subumbilical trocar was removed and the fascial defect closed by tying down the pursestring suture. The remaining trocars were removed and the pneumoperitoneum was released.  Skin incisions were closed with 4-0 Monocryl subcuticular stitches. Steri-Strips and sterile dressings were applied. He tolerated the procedure well without any apparent complications and was taken to the recovery room in satisfactory condition.  Estimated Blood Loss:  200 mL         Drains: none  Blood Given: none          Specimens: Gallbladder        Complications:  * No complications entered in OR log *         Disposition: PACU - hemodynamically stable.         Condition: stable

## 2013-11-18 NOTE — Transfer of Care (Signed)
Immediate Anesthesia Transfer of Care Note  Patient: Alexander [name]  Procedure(s) Performed: Procedure(s) (LRB): LAPAROSCOPIC CHOLECYSTECTOMY With IOC (N/A)  Patient Location: PACU  Anesthesia Type: General  Level of Consciousness: sedated, patient cooperative and responds to stimulation  Airway & Oxygen Therapy: Patient Spontanous Breathing and Patient connected to face mask oxgen  Post-op Assessment: Report given to PACU RN and Post -op Vital signs reviewed and stable  Post vital signs: Reviewed and stable  Complications: No apparent anesthesia complications

## 2013-11-18 NOTE — H&P (View-Only) (Signed)
Patient ID: Alexander [name], male   DOB: 11/22/1936, 77 y.o.   MRN: [id number]  Chief Complaint  Patient presents with  . New Evaluation    eval and recheck gb / schedule sx    HPI Alexander [name] is a 77 y.o. male.   HPI  He is well known to me. I saw him a little over 2 years ago because of symptomatic cholelithiasis. He was quite deconditioned at that time following his open heart surgery. He subsequently has been feeling a lot better. Recently however he has had a couple cases of biliary colic. Ultrasound again demonstrates cholelithiasis. Recent liver function tests from October are within normal limits. He's been sent over here by Dr. Brodie to discuss cholecystectomy.  Past Medical History  Diagnosis Date  . Pneumonia   . Pulmonary embolism 2012    s/p heart surgery  . Atrial fibrillation   . Heart attack   . Asthma   . Shortness of breath   . Hyperlipidemia   . Dysuria   . Blood transfusion   . Clotting disorder   . Difficulty urinating   . Rheumatic fever   . Family hx of alcoholism   . History of anal fissures   . Skin cancer     Hx of   . Anxiety   . Sleep apnea     no CPAP  . Hemorrhoids   . Diverticulosis of colon (without mention of hemorrhage)   . Internal hemorrhoids without mention of complication   . Allergic rhinitis due to pollen   . Basal cell carcinoma of skin of lip   . History of cholelithiasis   . Edema   . Nonspecific elevation of levels of transaminase or lactic acid dehydrogenase (LDH)   . Long term (current) use of anticoagulants   . Other abnormal blood chemistry   . Other voice and resonance disorders   . Coronary atherosclerosis of native coronary artery   . COPD (chronic obstructive pulmonary disease)   . Hypertrophy of prostate with urinary obstruction and other lower urinary tract symptoms (LUTS)   . Hypertension   . Impotence of organic origin   . Unspecified constipation   . Anal fissure   . Blood transfusion without reported  diagnosis     Past Surgical History  Procedure Laterality Date  . Cardiac surgery    . Hernia repair  2010    bilateral  . Coronary artery bypass graft  Febuary 13, 2012    full maze   . Scrotum exploration      multiple  . Mohs surgery      squamus   . Hip surgery      tumor removed from left hip   . Vasectomy  04/1992  . Superficial skin cystectomy (l) hip  1997    dr evans  . Hydrocele excision      dr wrenn   . Bilateral inguinal herniorrhaphy w/mesh  2009    dr Keamber Macfadden  . Midian sterotomy,coronary artery bypass graft 3  01/20/2011  . Spermatocelectomy  02/1999    Right, Dr.[name]    Family History  Problem Relation Age of Onset  . Bone cancer Mother   . Diabetes Mother   . Colon cancer Neg Hx   . Esophageal cancer Neg Hx   . Rectal cancer Neg Hx   . Stomach cancer Neg Hx     Social History History  Substance Use Topics  . Smoking status: Former Smoker      Types: Cigarettes    Quit date: 12/08/1977  . Smokeless tobacco: Never Used  . Alcohol Use: Yes     Comment: former heavy alcohol use, rare use    Allergies  Allergen Reactions  . Aspirin     Breathing complications, able to take three times a week  . Codeine   . Desyrel [Trazodone]   . Heparin     Blood clots   . Oxycodone   . Plavix [Clopidogrel Bisulfate]   . Zocor [Simvastatin]   . Zoloft [Sertraline Hcl]     Current Outpatient Prescriptions  Medication Sig Dispense Refill  . albuterol (PROVENTIL HFA;VENTOLIN HFA) 108 (90 BASE) MCG/ACT inhaler Inhale 2 puffs into the lungs every 6 (six) hours as needed for wheezing.  1 Inhaler  0  . ALPRAZolam (XANAX) 0.5 MG tablet Take 1 tablet (0.5 mg total) by mouth as directed.  30 tablet  1  . amitriptyline (ELAVIL) 25 MG tablet Take 1 tablet (25 mg total) by mouth at bedtime. Pt takes 1/4 tablet qhs  90 tablet  0  . aspirin 81 MG tablet Take 81 mg by mouth daily.        . Cholecalciferol (VITAMIN D PO) Take by mouth. Takes 800 iu as directed       . cyclobenzaprine (FLEXERIL) 10 MG tablet Take one tablet 3 times a day to relax muscles  30 tablet  5  . diazepam (VALIUM) 5 MG tablet One up to to 4 times daily for anxiety  30 tablet  0  . dicyclomine (BENTYL) 10 MG capsule       . Flaxseed, Linseed, 1000 MG CAPS Take 1 capsule by mouth daily.      . Multiple Vitamins-Minerals (CENTRUM SILVER PO) Take 1 capsule by mouth daily.      . rosuvastatin (CRESTOR) 10 MG tablet Take 5 mg by mouth daily.       . sulindac (CLINORIL) 200 MG tablet Takes 100 mg by mouth four times a week       No current facility-administered medications for this visit.    Review of Systems Review of Systems  Constitutional: Negative for fever and chills.  Respiratory: Negative.   Cardiovascular: Negative.   Gastrointestinal: Positive for abdominal pain and diarrhea. Negative for nausea.  Genitourinary: Negative.   Hematological: Negative.     Blood pressure 124/70, pulse 84, temperature 99.1 F (37.3 C), temperature source Temporal, resp. rate 15, height 5' 10" (1.778 m), weight 191 lb (86.637 kg).  Physical Exam Physical Exam  Constitutional: He appears well-developed and well-nourished. No distress.  HENT:  Head: Normocephalic and atraumatic.  Eyes: EOM are normal. No scleral icterus.  Neck: Neck supple.  Cardiovascular: Normal rate and regular rhythm.   Pulmonary/Chest: Effort normal and breath sounds normal.  midsternal scar.  Abdominal: Soft. He exhibits no distension and no mass. There is no tenderness.  Musculoskeletal: He exhibits no edema.  Lymphadenopathy:    He has no cervical adenopathy.  Neurological: He is alert.  Skin: Skin is warm and dry.  Psychiatric: He has a normal mood and affect. His behavior is normal.    Data [facility] Note from Dr. Brodie  Assessment    Symptomatic cholelithiasis.     Plan    Obtain cardiac clearance from Dr. Hank Smith.  Once this is obtained, will schedule laparoscopic possible open cholecystectomy.  I have explained the procedure, risks, and aftercare of cholecystectomy.    Risks include but are not limited to bleeding, infection, wound problems, anesthesia, diarrhea, bile leak, injury to common bile duct/liver/intestine.  He seems to understand and agrees to proceed.        Evyn Putzier J 11/01/2013, 10:00 AM   Dr. Smith feels that as long as he does not have any cardiac symptoms, he is low risk and is okay to have a laparoscopic cholecystectomy. Mr. Poster is symptom-free and is quite active. Will work on scheduling the operation. This has been discussed with him. 

## 2013-11-18 NOTE — Preoperative (Signed)
Beta Blockers   Reason not to administer Beta Blockers:Not Applicable 

## 2013-11-18 NOTE — Anesthesia Postprocedure Evaluation (Signed)
Anesthesia Post Note  Patient: Alexander [name]  Procedure(s) Performed: Procedure(s) (LRB): LAPAROSCOPIC CHOLECYSTECTOMY With IOC (N/A)  Anesthesia type: General  Patient location: PACU  Post pain: Pain level controlled  Post assessment: Post-op Vital signs reviewed  Last Vitals:  Filed Vitals:   11/18/13 1645  BP: 115/50  Pulse: 60  Temp:   Resp: 16    Post vital signs: Reviewed  Level of consciousness: sedated  Complications: No apparent anesthesia complications

## 2013-11-18 NOTE — Interval H&P Note (Signed)
History and Physical Interval Note:  11/18/2013 12:32 PM  Alexander [name]  has presented today for surgery, with the diagnosis of cholelithiasis   The various methods of treatment have been discussed with the patient and family. After consideration of risks, benefits and other options for treatment, the patient has consented to  Procedure(s): LAPAROSCOPIC CHOLECYSTECTOMY WITH INTRAOPERATIVE CHOLANGIOGRAM (N/A) as a surgical intervention .  The patient's history has been reviewed, patient examined, no change in status, stable for surgery.  I have reviewed the patient's chart and labs.  Questions were answered to the patient's satisfaction.     Alexander [name]

## 2013-11-19 ENCOUNTER — Telehealth (INDEPENDENT_AMBULATORY_CARE_PROVIDER_SITE_OTHER): Payer: Self-pay | Admitting: General Surgery

## 2013-11-19 NOTE — Telephone Encounter (Signed)
He called saying he was voiding small amounts frequently.  I told him this could be a side effect of anesthesia and as long as it got better he would not need to be seen.  If he continued to have problems and could not void, I told him to go to the ED as he would need to have a foley inserted.

## 2013-11-19 NOTE — Telephone Encounter (Signed)
Pt called stating he was coughing up some clear phlegm.  Told him this can be normal right after surgery.  Still having some trouble voiding but can void lying down.

## 2013-11-20 ENCOUNTER — Telehealth (INDEPENDENT_AMBULATORY_CARE_PROVIDER_SITE_OTHER): Payer: Self-pay | Admitting: General Surgery

## 2013-11-20 NOTE — Telephone Encounter (Signed)
He called while I was involved in an emergency operation and spoke with the operating room nurse. He had a temperature of 99.3. I told him to take deep breaths and walk more. I told to call back if the temperature is greater than 101.5. He is voiding better.

## 2013-11-21 ENCOUNTER — Encounter (HOSPITAL_COMMUNITY): Payer: Self-pay | Admitting: General Surgery

## 2013-11-21 ENCOUNTER — Telehealth (INDEPENDENT_AMBULATORY_CARE_PROVIDER_SITE_OTHER): Payer: Self-pay | Admitting: General Surgery

## 2013-11-21 NOTE — Telephone Encounter (Signed)
Patient called with post op questions status post lap chole on Friday. He wanted to make sure he could start eating - I advised we would not restrict his diet at this point but that greasy/fatty foods may cause some diarrhea. He also has not had a bowel movement since surgery. He has been taking colace and took a dose of miralax yesterday. I advised him to take another dose of miralax today and to start milk of magnesia tonight if no movement. He will call back with any other questions.

## 2013-11-22 ENCOUNTER — Encounter (INDEPENDENT_AMBULATORY_CARE_PROVIDER_SITE_OTHER): Payer: Self-pay

## 2013-11-23 NOTE — Telephone Encounter (Signed)
Patient called asking about his diet.  Explained to patient that he can eat whatever he feels he can tolerate.  Explained that we do not have any restrictions on him at this time.  Patient also wanted to let us know that he has had 2 BMs since the last time he called.

## 2013-11-24 ENCOUNTER — Telehealth (INDEPENDENT_AMBULATORY_CARE_PROVIDER_SITE_OTHER): Payer: Self-pay | Admitting: General Surgery

## 2013-11-24 ENCOUNTER — Telehealth (INDEPENDENT_AMBULATORY_CARE_PROVIDER_SITE_OTHER): Payer: Self-pay | Admitting: *Deleted

## 2013-11-24 ENCOUNTER — Ambulatory Visit (INDEPENDENT_AMBULATORY_CARE_PROVIDER_SITE_OTHER): Payer: Medicare Other | Admitting: Nurse Practitioner

## 2013-11-24 ENCOUNTER — Encounter: Payer: Self-pay | Admitting: Nurse Practitioner

## 2013-11-24 VITALS — BP 122/70 | HR 76 | Temp 97.8°F | Resp 16 | Wt 185.0 lb

## 2013-11-24 DIAGNOSIS — J329 Chronic sinusitis, unspecified: Secondary | ICD-10-CM

## 2013-11-24 DIAGNOSIS — R300 Dysuria: Secondary | ICD-10-CM

## 2013-11-24 LAB — POCT URINALYSIS DIPSTICK
Bilirubin, UA: NEGATIVE
Leukocytes, UA: NEGATIVE
Nitrite, UA: NEGATIVE
Spec Grav, UA: >=1.03
pH, UA: 6

## 2013-11-24 MED ORDER — AZITHROMYCIN 250 MG PO TABS: ORAL_TABLET | ORAL | Status: AC

## 2013-11-24 NOTE — Patient Instructions (Addendum)
Cont to gurgle with salt water 2-3 times daily Only fill z pac if no improvement after 4-5 days   Sinusitis Sinusitis is redness, soreness, and swelling (inflammation) of the paranasal sinuses. Paranasal sinuses are air pockets within the bones of your face (beneath the eyes, the middle of the forehead, or above the eyes). In healthy paranasal sinuses, mucus is able to drain out, and air is able to circulate through them by way of your nose. However, when your paranasal sinuses are inflamed, mucus and air can become trapped. This can allow bacteria and other germs to grow and cause infection. Sinusitis can develop quickly and last only a short time (acute) or continue over a long period (chronic). Sinusitis that lasts for more than 12 weeks is considered chronic.  CAUSES  Causes of sinusitis include:  Allergies.  Structural abnormalities, such as displacement of the cartilage that separates your nostrils (deviated septum), which can decrease the air flow through your nose and sinuses and affect sinus drainage.  Functional abnormalities, such as when the small hairs (cilia) that line your sinuses and help remove mucus do not work properly or are not present. SYMPTOMS  Symptoms of acute and chronic sinusitis are the same. The primary symptoms are pain and pressure around the affected sinuses. Other symptoms include:  Upper toothache.  Earache.  Headache.  Bad breath.  Decreased sense of smell and taste.  A cough, which worsens when you are lying flat.  Fatigue.  Fever.  Thick drainage from your nose, which often is green and may contain pus (purulent).  Swelling and warmth over the affected sinuses. DIAGNOSIS  Your caregiver will perform a physical exam. During the exam, your caregiver may:  Look in your nose for signs of abnormal growths in your nostrils (nasal polyps).  Tap over the affected sinus to check for signs of infection.  View the inside of your sinuses (endoscopy)  with a special imaging device with a light attached (endoscope), which is inserted into your sinuses. If your caregiver suspects that you have chronic sinusitis, one or more of the following tests may be recommended:  Allergy tests.  Nasal culture A sample of mucus is taken from your nose and sent to a lab and screened for bacteria.  Nasal cytology A sample of mucus is taken from your nose and examined by your caregiver to determine if your sinusitis is related to an allergy. TREATMENT  Most cases of acute sinusitis are related to a viral infection and will resolve on their own within 10 days. Sometimes medicines are prescribed to help relieve symptoms (pain medicine, decongestants, nasal steroid sprays, or saline sprays).  However, for sinusitis related to a bacterial infection, your caregiver will prescribe antibiotic medicines. These are medicines that will help kill the bacteria causing the infection.  Rarely, sinusitis is caused by a fungal infection. In theses cases, your caregiver will prescribe antifungal medicine. For some cases of chronic sinusitis, surgery is needed. Generally, these are cases in which sinusitis recurs more than 3 times per year, despite other treatments. HOME CARE INSTRUCTIONS   Drink plenty of water. Water helps thin the mucus so your sinuses can drain more easily.  Use a humidifier.  Inhale steam 3 to 4 times a day (for example, sit in the bathroom with the shower running).  Apply a warm, moist washcloth to your face 3 to 4 times a day, or as directed by your caregiver.  Use saline nasal sprays to help moisten and clean your  sinuses.  Take over-the-counter or prescription medicines for pain, discomfort, or fever only as directed by your caregiver. SEEK IMMEDIATE MEDICAL CARE IF:  You have increasing pain or severe headaches.  You have nausea, vomiting, or drowsiness.  You have swelling around your face.  You have vision problems.  You have a stiff  neck.  You have difficulty breathing. MAKE SURE YOU:   Understand these instructions.  Will watch your condition.  Will get help right away if you are not doing well or get worse. Document Released: 11/24/2005 Document Revised: 02/16/2012 Document Reviewed: 12/09/2011 [facility] Patient Information 2014 [place], [place].

## 2013-11-24 NOTE — Telephone Encounter (Signed)
Patient called tonight to complain of blood in his urine intermittently since surgery. He saw his primary care physician today who ordered a UA which confirmed hemoglobin. Is calling to ask why he is still having blood in his urine. I told him to await results for the UA to come back to see if he had a urinary tract infection. Otherwise I will have Dr. Abbey Chatters get in touch with him tomorrow.

## 2013-11-24 NOTE — Telephone Encounter (Signed)
Pt walked into clinic at 4:50pm stating that he wanted to talk to Dr. Abbey Chatters because he has blood in his urine and he is concerned that he is having an internal bleed.  Pt visited his PCP today about a sinus infection and having dark urine.  His PCP did test his urine with shows small amt of blood.  He states that he really wants to speak with Dr. Abbey Chatters.  I instructed him that if he is concerned he is having an internal bleed he needs to go to the ER to be worked up as we have an on Building services engineer there as well.  I explained to the patient that I would send a message to Dr. Abbey Chatters to make him aware.  He really wants to speak with Dr. Abbey Chatters personally as he has many concerns about his surgery.  Pt requesting that we order labs and see what is going on.

## 2013-11-24 NOTE — Progress Notes (Signed)
Patient ID: Alexander [name], male   DOB: Jan 12, 1936, 77 y.o.   MRN: [id number]    Allergies  Allergen Reactions  . Heparin     Blood clots   . Penicillins Shortness Of Breath  . Aspirin     Breathing complications, able to take three times a week  . Codeine     Some is ok  . Oxycodone     Can not take percocet, palpitations  . Zocor [Simvastatin]     Liver enzymes increased  . Plavix [Clopidogrel Bisulfate] Palpitations    Chief Complaint  Patient presents with  . Acute Visit    Sore Throat, RT ear pain, nasal congestion with thick white mucus x 5-6 days ago.   he had gall-bladder surgery on 11/18/13    HPI: Patient is a 77 y.o. male seen in the office today for sore throat Has gallbladder surgery on the 12th- since he has been home he had ear pain on the right off and on, noticed pain on the right side of his throat, something rubbing Eyes watering, having sinsus drainage and irriation in the back on the troat No shortness of breath or chest congestion Has had a low grade fever 99.4 since he was in the hospital but this has gone away.   Review of Systems:  Review of Systems  Constitutional: Negative for fever, chills and malaise/fatigue.  HENT: Positive for congestion, ear pain and sore throat. Negative for ear discharge.   Eyes: Negative.   Respiratory: Negative for cough, shortness of breath and wheezing.   Cardiovascular: Negative for chest pain and palpitations.  Gastrointestinal: Positive for diarrhea.  Genitourinary: Negative for dysuria, urgency and frequency.  Musculoskeletal: Negative for myalgias.  Neurological: Negative for weakness and headaches.     Past Medical History  Diagnosis Date  . Pneumonia   . Pulmonary embolism 2012    s/p heart surgery  . Hyperlipidemia   . Dysuria   . Clotting disorder   . Difficulty urinating   . Rheumatic fever age 28  . History of anal fissures   . Anxiety   . Diverticulosis of colon (without mention of hemorrhage)      mild  . Internal hemorrhoids without mention of complication   . Allergic rhinitis due to pollen   . History of cholelithiasis   . Edema   . Nonspecific elevation of levels of transaminase or lactic acid dehydrogenase (LDH)   . Long term (current) use of anticoagulants   . Other voice and resonance disorders     after surgery, took breathing tube out and damaged something  . Coronary atherosclerosis of native coronary artery   . Hypertrophy of prostate with urinary obstruction and other lower urinary tract symptoms (LUTS)   . Impotence of organic origin   . Unspecified constipation   . Atrial fibrillation     hx of  . Heart attack 2012  . Anginal pain     once in a while, none recent  . Sleep apnea     no CPAP  . Asthma     exercise induced  . Skin cancer     Hx of squamous cell x2  . Basal cell carcinoma of skin of lip   . Jaundice age 76   Past Surgical History  Procedure Laterality Date  . Hernia repair  2010    bilateral  . Coronary artery bypass graft  Febuary 13, 2012    full maze   . Scrotum exploration  1990's  multiple  . Mohs surgery      squamus x2  . Vasectomy  04/1992  . Superficial skin cystectomy (l) hip Right 1997    dr Logan Bores  . Hydrocele excision      dr Annabell Howells   . Spermatocelectomy  02/1999    Right, Dr.[name]  . Cholecystectomy N/A 11/18/2013    Procedure: LAPAROSCOPIC CHOLECYSTECTOMY With IOC;  Surgeon: Adolph Pollack, MD;  Location: [facility];  Service: General;  Laterality: N/A;   Social History:   reports that he quit smoking about 35 years ago. His smoking use included Cigarettes. He has a 10.5 pack-year smoking history. He has never used smokeless tobacco. He reports that he drinks alcohol. He reports that he does not use illicit drugs.  Family History  Problem Relation Age of Onset  . Bone cancer Mother   . Diabetes Mother   . Colon cancer Neg Hx   . Esophageal cancer Neg Hx   . Rectal cancer Neg Hx   . Stomach cancer Neg Hx      Medications: Patient's Medications  New Prescriptions   No medications on file  Previous Medications   ALBUTEROL (PROVENTIL HFA;VENTOLIN HFA) 108 (90 BASE) MCG/ACT INHALER    Inhale 2 puffs into the lungs every 6 (six) hours as needed for wheezing.   ALPRAZOLAM (XANAX) 0.5 MG TABLET    Take 0.25 mg by mouth at bedtime as needed for anxiety or sleep.   AMITRIPTYLINE (ELAVIL) 25 MG TABLET    Take 6.25 mg by mouth at bedtime. Takes 1/4 tablet   CHOLECALCIFEROL (VITAMIN D) 1000 UNITS TABLET    Take 1,000 Units by mouth daily.   CYCLOBENZAPRINE (FLEXERIL) 10 MG TABLET    Take 2.5 mg by mouth 3 (three) times daily as needed for muscle spasms. Takes 1/4 tablet when needed for spasms   DIAZEPAM (VALIUM) 5 MG TABLET    Take 5 mg by mouth at bedtime.   DICYCLOMINE (BENTYL) 10 MG CAPSULE    Take 10 mg by mouth. Patient takes as needed per patient   FLAXSEED, LINSEED, 1000 MG CAPS    Take 1 capsule by mouth daily.   MULTIPLE VITAMINS-MINERALS (CENTRUM SILVER PO)    Take 1 capsule by mouth daily.   ROSUVASTATIN (CRESTOR) 10 MG TABLET    Take 5 mg by mouth at bedtime.    SULINDAC (CLINORIL) 200 MG TABLET    Take 200 mg by mouth daily as needed (pain). Usually takes 4 times per week  Modified Medications   No medications on file  Discontinued Medications   HYDROCODONE-ACETAMINOPHEN (NORCO) 5-325 MG PER TABLET    Take 1-2 tablets by mouth every 6 (six) hours as needed.     Physical Exam:  Filed Vitals:   11/24/13 1528  BP: 122/70  Pulse: 76  Temp: 97.8 F (36.6 C)  TempSrc: Oral  Resp: 16  Weight: 185 lb (83.915 kg)  SpO2: 97%    Physical Exam  Constitutional: He is well-developed, well-nourished, and in no distress.  HENT:  Head: Normocephalic and atraumatic.  Right Ear: Tympanic membrane, external ear and ear canal normal.  Left Ear: Tympanic membrane, external ear and ear canal normal.  Nose: Nose normal.  Mouth/Throat: Oropharynx is clear and moist. No oropharyngeal exudate.   Eyes: Conjunctivae and EOM are normal. Pupils are equal, round, and reactive to light.  Neck: Normal range of motion. Neck supple. No thyromegaly present.  Cardiovascular: Normal rate, regular rhythm and normal heart sounds.  Pulmonary/Chest: Effort normal and breath sounds normal. No respiratory distress.  Abdominal: Soft. Bowel sounds are normal. He exhibits no distension. There is no tenderness.  Musculoskeletal: He exhibits no edema.  Neurological: He is alert.  Skin: Skin is warm and dry. He is not diaphoretic.     Labs reviewed: Basic Metabolic Panel:  Recent Labs  40/98/11 0946 07/12/13 0843 11/15/13 1112  NA 141 140 138  K 4.1 3.9 4.9  CL 102 102 101  CO2 25 24 29   GLUCOSE 91 96 108*  BUN 16 16 16   CREATININE 1.12 1.11 1.08  CALCIUM 8.9 9.2 9.4   Liver Function Tests:  Recent Labs  04/11/13 0946 07/12/13 0843 11/15/13 1112  AST 27 24 25   ALT 17 16 20   ALKPHOS 48 49 55  BILITOT 1.4* 1.2 1.0  PROT 6.0 6.1 7.1  ALBUMIN  --   --  3.8   No results found for this basename: LIPASE, AMYLASE,  in the last 8760 hours No results found for this basename: AMMONIA,  in the last 8760 hours CBC:  Recent Labs  11/15/13 1112  WBC 6.9  NEUTROABS 4.8  HGB 15.7  HCT 46.0  MCV 92.2  PLT 236   Lipid Panel:  Recent Labs  04/11/13 0946 07/12/13 0843  HDL 53 54  LDLCALC 96 91  TRIG 70 72  CHOLHDL 3.1 2.9   TSH: No results found for this basename: TSH,  in the last 8760 hours A1C: No components found with this basename: A1C,    Assessment/Plan 1. Sinusitis -reports history of sinusitis; to cont supportive care, most likely viral but will give antibiotic if symptoms to not improve in the next several days, to call if he takes zpack -increase hydration -saline to bilateral nares as needed - azithromycin (ZITHROMAX) 250 MG tablet; 2 tablets today then 1 daily for 4 days  Dispense: 6 tablet; Refill: 0  2. Dysuria -to increase water intake  - POC  Urinalysis Dipstick - Culture, Urine; Future - Culture, Urine  To follow up or seek medical attention if symptoms get worse on antibiotic or fail to improve

## 2013-11-25 ENCOUNTER — Other Ambulatory Visit (INDEPENDENT_AMBULATORY_CARE_PROVIDER_SITE_OTHER): Payer: Self-pay | Admitting: *Deleted

## 2013-11-25 ENCOUNTER — Telehealth (INDEPENDENT_AMBULATORY_CARE_PROVIDER_SITE_OTHER): Payer: Self-pay | Admitting: General Surgery

## 2013-11-25 DIAGNOSIS — R300 Dysuria: Secondary | ICD-10-CM

## 2013-11-25 LAB — URINALYSIS W MICROSCOPIC + REFLEX CULTURE
Bacteria, UA: NONE SEEN
Bilirubin Urine: NEGATIVE
Glucose, UA: NEGATIVE mg/dL
Ketones, ur: NEGATIVE mg/dL
Leukocytes, UA: NEGATIVE
Nitrite: NEGATIVE
Protein, ur: NEGATIVE mg/dL
RBC / HPF: NONE SEEN RBC/hpf (ref <3)
Squamous Epithelial / LPF: NONE SEEN
Urobilinogen, UA: 0.2 mg/dL (ref 0.0–1.0)
WBC, UA: NONE SEEN WBC/hpf (ref <3)

## 2013-11-25 LAB — URINE CULTURE

## 2013-11-25 NOTE — Telephone Encounter (Signed)
I spoke with Alexander [name] this morning regarding his urinary problems. He was seen at his primary care physician's office because he is having some right ear pain and a sore throat. He has some difficulty with transient urinary retention after the surgery. He states his urine is dark yellow in the morning but clears up just a little bit during the day.  It is not tea Or Coca-Cola colored. Apparently he had a little bit of blood in his urine on a urine dipstick test yesterday at his primary care physician's office and he is concerned about this. A urine culture was sent. He is having some urinary  Urgency but not significant dysuria. He is having mild incisional abdominal pain which has been improving. No fever. He has not had much of an appetite but this is improving. Bowels are moving. His urinary specific gravity was greater than 1.030. I told him I was concerned that he was significantly dehydrated but he could also have a urinary tract infection. The plan is to order a stat urinalysis with microscopic exam today. If that shows some hematuria and possible signs of infection that we can empirically start treatment and wait for the culture results on Monday. If it is just so his hematuria without signs of infection, he may need a referral to the urologist. I've asked him to aggressively hydrate himself.

## 2013-11-25 NOTE — Telephone Encounter (Signed)
STAT UA with culture reflux ordered at this time.  I called the patient to inform him to go to [facility] lab as soon as possible to give a urine specimen.  Patient states understanding and agreeable at this time.

## 2013-11-28 ENCOUNTER — Telehealth (INDEPENDENT_AMBULATORY_CARE_PROVIDER_SITE_OTHER): Payer: Self-pay | Admitting: General Surgery

## 2013-11-28 NOTE — Telephone Encounter (Signed)
His UA with micro did not demonstrate any evidence of hematuria or bacturia.  I discussed this with him at length.  I think her is progressing satisfactorily.

## 2013-12-06 ENCOUNTER — Telehealth (INDEPENDENT_AMBULATORY_CARE_PROVIDER_SITE_OTHER): Payer: Self-pay

## 2013-12-06 NOTE — Telephone Encounter (Signed)
Pt had lap cholecystectomy on 11/18/13 by Dr. Abbey Chatters.  He is calling with questions about returning to his exercise routine.  I explained that he would need to wait at least 4 weeks from the date of surgery, but should be seen by Dr. Abbey Chatters post operatively so he can be released to full activity.  I did tell him that short walks would be fine.  Pt understood and agreed.

## 2013-12-16 ENCOUNTER — Ambulatory Visit (INDEPENDENT_AMBULATORY_CARE_PROVIDER_SITE_OTHER): Payer: Medicare Other | Admitting: General Surgery

## 2013-12-16 ENCOUNTER — Encounter (INDEPENDENT_AMBULATORY_CARE_PROVIDER_SITE_OTHER): Payer: Self-pay | Admitting: General Surgery

## 2013-12-16 VITALS — BP 110/66 | HR 68 | Temp 97.8°F | Resp 14 | Ht 69.5 in | Wt 188.8 lb

## 2013-12-16 DIAGNOSIS — Z4889 Encounter for other specified surgical aftercare: Secondary | ICD-10-CM

## 2013-12-16 NOTE — Progress Notes (Signed)
Procedure:  Laparoscopic cholecystectomy with cholangiogram  Date:  11/18/2013  Pathology:  Chronic cholecystitis and Cholelithiasis  History:  He presents for his first postoperative visit. He is doing much better from all facets. He is 42 well. Bowels are moving. He is eating. He like to start exercising again.  Exam: General- Is in NAD. Abdomen-soft, incisions are clean, intact, and solid.  Assessment:  He has recovered well following his laparoscopic cholecystectomy. No further voiding problems.  Plan:  Resume normal activities as tolerated. Return visit as needed.

## 2013-12-20 ENCOUNTER — Ambulatory Visit: Payer: Medicare Other | Admitting: Interventional Cardiology

## 2013-12-22 ENCOUNTER — Ambulatory Visit (INDEPENDENT_AMBULATORY_CARE_PROVIDER_SITE_OTHER): Payer: Medicare Other | Admitting: Interventional Cardiology

## 2013-12-22 ENCOUNTER — Encounter: Payer: Self-pay | Admitting: Interventional Cardiology

## 2013-12-22 VITALS — BP 120/80 | HR 70 | Ht 69.5 in | Wt 189.0 lb

## 2013-12-22 DIAGNOSIS — E785 Hyperlipidemia, unspecified: Secondary | ICD-10-CM

## 2013-12-22 DIAGNOSIS — I25810 Atherosclerosis of coronary artery bypass graft(s) without angina pectoris: Secondary | ICD-10-CM | POA: Insufficient documentation

## 2013-12-22 DIAGNOSIS — F411 Generalized anxiety disorder: Secondary | ICD-10-CM

## 2013-12-22 DIAGNOSIS — I2510 Atherosclerotic heart disease of native coronary artery without angina pectoris: Secondary | ICD-10-CM

## 2013-12-22 NOTE — Patient Instructions (Signed)
Your physician recommends that you continue on your current medications as directed. Please refer to the Current Medication list given to you today.  Your physician wants you to follow-up in: 1 year You will receive a reminder letter in the mail two months in advance. If you don't receive a letter, please call our office to schedule the follow-up appointment.   Your physician discussed the importance of regular exercise and recommended that you start or continue a regular exercise program for good health.   

## 2013-12-22 NOTE — Progress Notes (Signed)
Patient Alexander [name]: Alexander [name], male   DOB: [birth year], 78 y.o.   MRN: [id number]    [address], [address] [place], [place]  [address] Phone: [phone] Fax:  [phone]  Date:  12/22/2013   Alexander [name]:  Alexander [name], DOB 1936-07-11, MRN [id number]  PCP:  Estill Dooms, MD   ASSESSMENT:  1. Coronary artery disease, status post coronary bypass grafting. Patient is asymptomatic with reference to angina 2. Hyperlipidemia, adequately controlled 3. History of postoperative atrial fibrillation. No clinical recurrences since last visit. 4. Recent cholecystectomy  PLAN:  1. Continue aspirin 2. No change in therapy for lipids. He is intolerant of statins.   SUBJECTIVE: Alexander [name] is a 77 y.o. male gentleman who is status post coronary bypass surgery 3 years ago. He has had no recurrence of anginal complaints since that time. He had postoperative pulmonary embolism but denies any dyspnea or sequela of that problem. There also postoperative arrhythmias the he has not had palpitations or any complaint. He is intolerant of statin therapy. He is retired from work. He is becoming increasingly active physically. He feels that there are no limitations to his physical activity   Wt Readings from Last 3 Encounters:  12/22/13 189 lb (85.73 kg)  12/16/13 188 lb 12.8 oz (85.639 kg)  11/24/13 185 lb (83.915 kg)     Past Medical History  Diagnosis Date  . Pneumonia   . Pulmonary embolism 2012    s/p heart surgery  . Hyperlipidemia   . Dysuria   . Clotting disorder   . Difficulty urinating   . Rheumatic fever age 31  . History of anal fissures   . Anxiety   . Diverticulosis of colon (without mention of hemorrhage)     mild  . Internal hemorrhoids without mention of complication   . Allergic rhinitis due to pollen   . History of cholelithiasis   . Edema   . Nonspecific elevation of levels of transaminase or lactic acid dehydrogenase (LDH)   . Long term (current) use of anticoagulants    . Other voice and resonance disorders     after surgery, took breathing tube out and damaged something  . Coronary atherosclerosis of native coronary artery   . Hypertrophy of prostate with urinary obstruction and other lower urinary tract symptoms (LUTS)   . Impotence of organic origin   . Unspecified constipation   . Atrial fibrillation     hx of  . Heart attack 2012  . Anginal pain     once in a while, none recent  . Sleep apnea     no CPAP  . Asthma     exercise induced  . Skin cancer     Hx of squamous cell x2  . Basal cell carcinoma of skin of lip   . Jaundice age 28    Current Outpatient Prescriptions  Medication Sig Dispense Refill  . albuterol (PROVENTIL HFA;VENTOLIN HFA) 108 (90 BASE) MCG/ACT inhaler Inhale 2 puffs into the lungs every 6 (six) hours as needed for wheezing.  1 Inhaler  0  . ALPRAZolam (XANAX) 0.5 MG tablet Take 0.25 mg by mouth at bedtime as needed for anxiety or sleep.      Marland Kitchen amitriptyline (ELAVIL) 25 MG tablet Take 6.25 mg by mouth at bedtime. Takes 1/4 tablet      . cholecalciferol (VITAMIN D) 1000 UNITS tablet Take 1,000 Units by mouth daily.      . cyclobenzaprine (FLEXERIL) 10 MG tablet  Take 2.5 mg by mouth 3 (three) times daily as needed for muscle spasms. Takes 1/4 tablet when needed for spasms      . diazepam (VALIUM) 5 MG tablet Take 5 mg by mouth at bedtime.      . dicyclomine (BENTYL) 10 MG capsule Take 10 mg by mouth. Patient takes as needed per patient      . Flaxseed, Linseed, 1000 MG CAPS Take 1 capsule by mouth daily.      . Multiple Vitamins-Minerals (CENTRUM SILVER PO) Take 1 capsule by mouth daily.      . rosuvastatin (CRESTOR) 10 MG tablet Take 5 mg by mouth at bedtime.       . sulindac (CLINORIL) 200 MG tablet Take 200 mg by mouth daily as needed (pain). Usually takes 4 times per week       No current facility-administered medications for this visit.    Allergies:    Allergies  Allergen Reactions  . Heparin     Blood clots     . Penicillins Shortness Of Breath  . Aspirin     Breathing complications, able to take three times a week  . Codeine     Some is ok  . Oxycodone     Can not take percocet, palpitations  . Zocor [Simvastatin]     Liver enzymes increased  . Plavix [Clopidogrel Bisulfate] Palpitations    Social History:  The patient  reports that he quit smoking about 36 years ago. His smoking use included Cigarettes. He has a 10.5 pack-year smoking history. He has never used smokeless tobacco. He reports that he drinks alcohol. He reports that he does not use illicit drugs.   ROS:  Please see the history of present illness.   Denies transient neurological symptoms, palpitations, orthopnea, edema, syncope, and medication side effects.   All other systems reviewed and negative.   OBJECTIVE: VS:  BP 120/80  Pulse 70  Ht 5' 9.5" (1.765 m)  Wt 189 lb (85.73 kg)  BMI 27.52 kg/m2 Well nourished, well developed, in no acute distress HEENT: normal Neck: JVD flat. Carotid bruit absent  Cardiac:  normal S1, S2; RRR; no murmur Lungs:  clear to auscultation bilaterally, no wheezing, rhonchi or rales Abd: soft, nontender, no hepatomegaly Ext: Edema absent. Pulses 2+ Skin: warm and dry Neuro:  CNs 2-12 intact, no focal abnormalities noted  EKG:  No EKG is performed today       Signed, Illene Labrador III, MD 12/22/2013 3:25 PM

## 2013-12-29 ENCOUNTER — Ambulatory Visit (INDEPENDENT_AMBULATORY_CARE_PROVIDER_SITE_OTHER): Payer: Medicare Other | Admitting: Internal Medicine

## 2013-12-29 ENCOUNTER — Encounter: Payer: Self-pay | Admitting: Internal Medicine

## 2013-12-29 VITALS — BP 102/64 | HR 74 | Temp 97.7°F | Resp 16 | Wt 189.0 lb

## 2013-12-29 DIAGNOSIS — K1329 Other disturbances of oral epithelium, including tongue: Secondary | ICD-10-CM

## 2013-12-29 DIAGNOSIS — Z9189 Other specified personal risk factors, not elsewhere classified: Secondary | ICD-10-CM

## 2013-12-29 DIAGNOSIS — J309 Allergic rhinitis, unspecified: Secondary | ICD-10-CM

## 2013-12-29 MED ORDER — CETIRIZINE HCL 10 MG PO TABS: 10.0000 mg | ORAL_TABLET | Freq: Every day | ORAL | Status: DC

## 2013-12-29 NOTE — Progress Notes (Signed)
Patient ID: Alexander [name], male   DOB: 1936-01-26, 78 y.o.   MRN: [id number]   Location:  [facility]   Allergies  Allergen Reactions  . Heparin     Blood clots   . Penicillins Shortness Of Breath  . Aspirin     Breathing complications, able to take three times a week  . Codeine     Some is ok  . Oxycodone     Can not take percocet, palpitations  . Zocor [Simvastatin]     Liver enzymes increased  . Plavix [Clopidogrel Bisulfate] Palpitations    Chief Complaint  Patient presents with  . Acute Visit    continued sore throat    HPI: Patient is a 78 y.o. male seen in the office today for acute visit for continued sore throat, dull pain on his left side and left ear, watery eyes, sinus drainage x 3 wks.  Had been seen 12/18 and prescribed zpak if his symptoms did not improve with conservative measures.  Never did fill it.  Initial visit, symptoms were on right side.  Had right earache at that time also.  Had gallbladder out before that on 12/12.  Has had some soreness of his throat off and on for years.  Remembers that he pulled his tube out back when he had been in a medically induced coma after his cabg.  Dr. Lucia Gaskins saw him once for nodules on his vocal cords that could be removed, but he feels that his problems now are in his tonsils.  Having rhinitis, watery eyes, feels like hayfever.  Did also have a cough that has improved with gargling 6-8 x per day.  He is a Primary school teacher.  Has known allergies to dustmites, ragweed.  Is getting vents cleaned in his home.      Review of Systems:  Review of Systems  Constitutional: Negative for fever, chills and malaise/fatigue.  HENT: Positive for congestion and sore throat. Negative for ear pain and tinnitus.   Respiratory: Negative for shortness of breath and stridor.   Cardiovascular: Negative for chest pain and palpitations.  Gastrointestinal: Negative for heartburn.  Skin: Negative for rash.    Neurological: Positive for headaches. Negative for dizziness and weakness.  Endo/Heme/Allergies: Positive for environmental allergies.     Past Medical History  Diagnosis Date  . Pneumonia   . Pulmonary embolism 2012    s/p heart surgery  . Hyperlipidemia   . Dysuria   . Clotting disorder   . Difficulty urinating   . Rheumatic fever age 77  . History of anal fissures   . Anxiety   . Diverticulosis of colon (without mention of hemorrhage)     mild  . Internal hemorrhoids without mention of complication   . Allergic rhinitis due to pollen   . History of cholelithiasis   . Edema   . Nonspecific elevation of levels of transaminase or lactic acid dehydrogenase (LDH)   . Long term (current) use of anticoagulants   . Other voice and resonance disorders     after surgery, took breathing tube out and damaged something  . Coronary atherosclerosis of native coronary artery   . Hypertrophy of prostate with urinary obstruction and other lower urinary tract symptoms (LUTS)   . Impotence of organic origin   . Unspecified constipation   . Atrial fibrillation     hx of  . Heart attack 2012  . Anginal pain     once in a  while, none recent  . Sleep apnea     no CPAP  . Asthma     exercise induced  . Skin cancer     Hx of squamous cell x2  . Basal cell carcinoma of skin of lip   . Jaundice age 36    Past Surgical History  Procedure Laterality Date  . Hernia repair  2010    bilateral  . Coronary artery bypass graft  Febuary 13, 2012    full maze   . Scrotum exploration  1990's    multiple  . Mohs surgery      squamus x2  . Vasectomy  04/1992  . Superficial skin cystectomy (l) hip Right 1997    dr [name]  . Hydrocele excision      dr [name]   . Spermatocelectomy  02/1999    Right, Dr.[name]  . Cholecystectomy N/A 11/18/2013    Procedure: LAPAROSCOPIC CHOLECYSTECTOMY With IOC;  Surgeon: Odis Hollingshead, MD;  Location: [facility];  Service: General;  Laterality: N/A;    Social  History:   reports that he quit smoking about 36 years ago. His smoking use included Cigarettes. He has a 10.5 pack-year smoking history. He has never used smokeless tobacco. He reports that he drinks alcohol. He reports that he does not use illicit drugs.  Family History  Problem Relation Age of Onset  . Bone cancer Mother   . Diabetes Mother   . Colon cancer Neg Hx   . Esophageal cancer Neg Hx   . Rectal cancer Neg Hx   . Stomach cancer Neg Hx     Medications: Patient's Medications  New Prescriptions   No medications on file  Previous Medications   ALBUTEROL (PROVENTIL HFA;VENTOLIN HFA) 108 (90 BASE) MCG/ACT INHALER    Inhale 2 puffs into the lungs every 6 (six) hours as needed for wheezing.   ALPRAZOLAM (XANAX) 0.5 MG TABLET    Take 0.25 mg by mouth at bedtime as needed for anxiety or sleep.   AMITRIPTYLINE (ELAVIL) 25 MG TABLET    Take 6.25 mg by mouth at bedtime. Takes 1/4 tablet   CHOLECALCIFEROL (VITAMIN D) 1000 UNITS TABLET    Take 1,000 Units by mouth daily.   CYCLOBENZAPRINE (FLEXERIL) 10 MG TABLET    Take 2.5 mg by mouth 3 (three) times daily as needed for muscle spasms. Takes 1/4 tablet when needed for spasms   DIAZEPAM (VALIUM) 5 MG TABLET    Take 5 mg by mouth at bedtime.   DICYCLOMINE (BENTYL) 10 MG CAPSULE    Take 10 mg by mouth. Patient takes as needed per patient   FLAXSEED, LINSEED, 1000 MG CAPS    Take 1 capsule by mouth daily.   MULTIPLE VITAMINS-MINERALS (CENTRUM SILVER PO)    Take 1 capsule by mouth daily.   ROSUVASTATIN (CRESTOR) 10 MG TABLET    Take 5 mg by mouth at bedtime.    SULINDAC (CLINORIL) 200 MG TABLET    Take 200 mg by mouth daily as needed (pain). Usually takes 4 times per week  Modified Medications   No medications on file  Discontinued Medications   No medications on file     Physical Exam: Filed Vitals:   12/29/13 1034  BP: 102/64  Pulse: 74  Temp: 97.7 F (36.5 C)  TempSrc: Oral  Resp: 16  Weight: 189 lb (85.73 kg)  SpO2: 97%    Physical Exam  Constitutional: He is oriented to person, place, and time. He appears well-developed  and well-nourished. No distress.  Neck: Neck supple.  Cardiovascular: Normal rate, regular rhythm, normal heart sounds and intact distal pulses.   Pulmonary/Chest: Effort normal and breath sounds normal. No respiratory distress.  Lymphadenopathy:    He has no cervical adenopathy.  Neurological: He is alert and oriented to person, place, and time.  Psychiatric:  anxious    Labs reviewed: Basic Metabolic Panel:  Recent Labs  04/11/13 0946 07/12/13 0843 11/15/13 1112  NA 141 140 138  K 4.1 3.9 4.9  CL 102 102 101  CO2 25 24 29   GLUCOSE 91 96 108*  BUN 16 16 16   CREATININE 1.12 1.11 1.08  CALCIUM 8.9 9.2 9.4   Liver Function Tests:  Recent Labs  04/11/13 0946 07/12/13 0843 11/15/13 1112  AST 27 24 25   ALT 17 16 20   ALKPHOS 48 49 55  BILITOT 1.4* 1.2 1.0  PROT 6.0 6.1 7.1  ALBUMIN  --   --  3.8  CBC:  Recent Labs  11/15/13 1112  WBC 6.9  NEUTROABS 4.8  HGB 15.7  HCT 46.0  MCV 92.2  PLT 236   Lipid Panel:  Recent Labs  04/11/13 0946 07/12/13 0843  HDL 53 54  LDLCALC 96 91  TRIG 70 72  CHOLHDL 3.1 2.9   Lab Results  Component Value Date   HGBA1C  Value: 6.0 (NOTE)                                                                       According to the ADA Clinical Practice Recommendations for 2011, when HbA1c is used as a screening test:   >=6.5%   Diagnostic of Diabetes Mellitus           (if abnormal result  is confirmed)  5.7-6.4%   Increased risk of developing Diabetes Mellitus  References:Diagnosis and Classification of Diabetes Mellitus,Diabetes [id number] 1):S62-S69 and Standards of Medical Care in         Diabetes - 2011,Diabetes Care,2011,34  (Suppl 1):S11-S61.* 01/17/2011   Assessment/Plan 1. Allergic sinusitis - recommend trying 1/2 tab of zyrtec to see if this helps the congestion and irritation of his left side of his throat -if not  getting better, he should fill his zpak that was prescribed by Janett Billow -if still not improving and redness persists, will send back to ENT -cetirizine (ZYRTEC) 10 MG tablet; Take 1 tablet (10 mg total) by mouth daily.  Dispense: 30 tablet; Refill: 11  2. Oral erythroplakia -left side of throat is very red and irritated--if this does not resolve, should be reevaluated by ENT for make sure that this does not need a biopsy (did smoke many years ago until age 57)  78. H/O difficult intubation -notes irritation previously when intubated for his gallbladder surgery  Next appt:  Keep with dr. Nyoka Cowden in May--return sooner if not improving

## 2013-12-29 NOTE — Patient Instructions (Addendum)
Take zyrtec (cetirizine) over the counter 1/2 tablet to dry up your sinuses and help the throat irritation.  If you don't get relief by Monday, take the zpak.    Monitor the back of your throat.  If irritation persists, go back to Dr. Lucia Gaskins so he can evaluate the red place on the left side.

## 2013-12-30 ENCOUNTER — Telehealth: Payer: Self-pay | Admitting: *Deleted

## 2013-12-30 NOTE — Telephone Encounter (Signed)
Patient states he was seen yesterday for sore throat and when he got home there was pus pockets on the right side. i spoke with Dr. Mariea Clonts and she stated that she looked at his throat and she saw none and that it could be food pockets. Notified patient and told him if he continued to have discomfort to contact us. He agreed.

## 2014-01-21 ENCOUNTER — Other Ambulatory Visit: Payer: Self-pay | Admitting: Internal Medicine

## 2014-03-07 ENCOUNTER — Other Ambulatory Visit: Payer: Self-pay | Admitting: Dermatology

## 2014-04-07 ENCOUNTER — Ambulatory Visit (INDEPENDENT_AMBULATORY_CARE_PROVIDER_SITE_OTHER): Payer: Medicare Other | Admitting: Internal Medicine

## 2014-04-07 ENCOUNTER — Encounter: Payer: Self-pay | Admitting: Internal Medicine

## 2014-04-07 VITALS — BP 122/70 | HR 72 | Ht 68.5 in | Wt 190.5 lb

## 2014-04-07 DIAGNOSIS — K2960 Other gastritis without bleeding: Secondary | ICD-10-CM

## 2014-04-07 DIAGNOSIS — R1013 Epigastric pain: Secondary | ICD-10-CM

## 2014-04-07 DIAGNOSIS — K3189 Other diseases of stomach and duodenum: Secondary | ICD-10-CM

## 2014-04-07 NOTE — Patient Instructions (Signed)
Please take your omeprazole 1 tablet daily.  If symptoms are not better on omeprazole, we will set you up for endoscopy.  CC: Dr A. Nyoka Cowden

## 2014-04-07 NOTE — Progress Notes (Signed)
TRES GRZYWACZ 01/12/1936 [id number]  Note: This dictation was prepared with Dragon digital system. Any transcriptional errors that result from this procedure are unintentional.   History of Present Illness:  This is a 78 year old white male with irritable bowel syndrome who is status post laparoscopic cholecystectomy for symptomatic cholelithiasis in December 2014. He had a colonoscopy in July 2013 which showed mild diverticulosis and internal hemorrhoids. Since his cholecystectomy, he has had intermittent dyspepsia and a queasy feeling in his upper abdomen. He was given a prescription for omeprazole 20 mg a day but has not started taking it because he wanted to discuss it with me first. He is taking Clinoril 200 mg daily and aspirin 3 times a week.    Past Medical History  Diagnosis Date  . Pneumonia   . Pulmonary embolism 2012    s/p heart surgery  . Hyperlipidemia   . Dysuria   . Clotting disorder   . Difficulty urinating   . Rheumatic fever age 48  . History of anal fissures   . Anxiety   . Diverticulosis of colon (without mention of hemorrhage)     mild  . Internal hemorrhoids without mention of complication   . Allergic rhinitis due to pollen   . History of cholelithiasis   . Edema   . Nonspecific elevation of levels of transaminase or lactic acid dehydrogenase (LDH)   . Long term (current) use of anticoagulants   . Other voice and resonance disorders     after surgery, took breathing tube out and damaged something  . Coronary atherosclerosis of native coronary artery   . Hypertrophy of prostate with urinary obstruction and other lower urinary tract symptoms (LUTS)   . Impotence of organic origin   . Unspecified constipation   . Atrial fibrillation     hx of  . Heart attack 2012  . Anginal pain     once in a while, none recent  . Sleep apnea     no CPAP  . Asthma     exercise induced  . Skin cancer     Hx of squamous cell x2  . Basal cell carcinoma of skin of  lip   . Jaundice age 12    Past Surgical History  Procedure Laterality Date  . Hernia repair  2010    bilateral  . Coronary artery bypass graft  Febuary 13, 2012    full maze   . Scrotum exploration  1990's    multiple  . Mohs surgery      squamus x2  . Vasectomy  04/1992  . Superficial skin cystectomy (l) hip Right 1997    dr [name]  . Hydrocele excision      dr [name]   . Spermatocelectomy  02/1999    Right, Dr.[name]  . Cholecystectomy N/A 11/18/2013    Procedure: LAPAROSCOPIC CHOLECYSTECTOMY With IOC;  Surgeon: Odis Hollingshead, MD;  Location: [facility];  Service: General;  Laterality: N/A;    Allergies  Allergen Reactions  . Heparin     Blood clots   . Penicillins Shortness Of Breath  . Aspirin     Breathing complications, able to take three times a week  . Codeine     Some is ok  . Oxycodone     Can not take percocet, palpitations  . Zocor [Simvastatin]     Liver enzymes increased  . Plavix [Clopidogrel Bisulfate] Palpitations    Family history and social history have been reviewed.  Review of Systems:  Denies heartburn dysphagia  The remainder of the 10 point ROS is negative except as outlined in the H&P  Physical Exam: General Appearance Well developed, in no distress Eyes  Non icteric  HEENT  Non traumatic, normocephalic  Mouth No lesion, tongue papillated, no cheilosis Neck Supple without adenopathy, thyroid not enlarged, no carotid bruits, no JVD Lungs Clear to auscultation bilaterally COR Normal S1, normal S2, regular rhythm, no murmur, quiet precordium Abdomen soft nontender abdomen with normoactive bowel sounds minimal discomfort along the right costal margin and epigastrium. Lower abdomen unremarkable. Well-healed laparoscopic scars Rectal not done Extremities  No pedal edema Skin No lesions Neurological Alert and oriented x 3 Psychological Normal mood and affect  Assessment and Plan:   Problem #40 78 year old white male with epigastric  discomfort and dyspepsia following laparoscopic cholecystectomy. His symptoms preceded  cholecystectomy . He is on Clinoril and aspirin which may lead to gastropathy. He may also  Have a bile reflux gastritis or peptic gastritis. I told him to start taking omeprazole 20 mg daily for at least a month and will call us back if the symptoms continue at which time we will consider upper endoscopy.Consider adding Carafate .    [place] Dragon 04/07/2014

## 2014-04-08 ENCOUNTER — Encounter: Payer: Self-pay | Admitting: Internal Medicine

## 2014-04-10 ENCOUNTER — Telehealth: Payer: Self-pay | Admitting: Internal Medicine

## 2014-04-10 NOTE — Telephone Encounter (Signed)
Please ask pt to purchase Gaviscon OTC and chew 2 after each meal as needed for indigestion in place of Omeprazole.

## 2014-04-10 NOTE — Telephone Encounter (Signed)
Patient notified of recommendations. 

## 2014-04-10 NOTE — Telephone Encounter (Signed)
Spoke with patient and he took Omeprazole on Saturday and Sunday. He experienced side effects. He had a headache, cramping, loose stool and abdominal bloating. He took Valium and Bentyl and felt some better. States he feels better today. He is not going to take Omeprazole today. Please, advise.

## 2014-04-11 ENCOUNTER — Ambulatory Visit (INDEPENDENT_AMBULATORY_CARE_PROVIDER_SITE_OTHER): Payer: Medicare Other | Admitting: Internal Medicine

## 2014-04-11 ENCOUNTER — Encounter: Payer: Self-pay | Admitting: Internal Medicine

## 2014-04-11 VITALS — BP 130/70 | HR 75 | Temp 98.1°F | Ht 68.5 in | Wt 189.0 lb

## 2014-04-11 DIAGNOSIS — R3129 Other microscopic hematuria: Secondary | ICD-10-CM

## 2014-04-11 DIAGNOSIS — I2510 Atherosclerotic heart disease of native coronary artery without angina pectoris: Secondary | ICD-10-CM

## 2014-04-11 DIAGNOSIS — I209 Angina pectoris, unspecified: Secondary | ICD-10-CM

## 2014-04-11 DIAGNOSIS — E785 Hyperlipidemia, unspecified: Secondary | ICD-10-CM

## 2014-04-11 DIAGNOSIS — R1011 Right upper quadrant pain: Secondary | ICD-10-CM

## 2014-04-11 DIAGNOSIS — R311 Benign essential microscopic hematuria: Secondary | ICD-10-CM | POA: Insufficient documentation

## 2014-04-11 DIAGNOSIS — F411 Generalized anxiety disorder: Secondary | ICD-10-CM

## 2014-04-11 DIAGNOSIS — I10 Essential (primary) hypertension: Secondary | ICD-10-CM

## 2014-04-11 NOTE — Patient Instructions (Signed)
Continue current medications. 

## 2014-04-12 LAB — CBC WITH DIFFERENTIAL
Basophils Absolute: 0 10*3/uL (ref 0.0–0.2)
Basos: 0 %
EOS: 1 %
Eosinophils Absolute: 0.1 10*3/uL (ref 0.0–0.4)
HEMATOCRIT: 47.9 % (ref 37.5–51.0)
HEMOGLOBIN: 16.5 g/dL (ref 12.6–17.7)
IMMATURE GRANS (ABS): 0 10*3/uL (ref 0.0–0.1)
Immature Granulocytes: 0 %
Lymphocytes Absolute: 1.8 10*3/uL (ref 0.7–3.1)
Lymphs: 26 %
MCH: 31.3 pg (ref 26.6–33.0)
MCHC: 34.4 g/dL (ref 31.5–35.7)
MCV: 91 fL (ref 79–97)
Monocytes Absolute: 0.5 10*3/uL (ref 0.1–0.9)
Monocytes: 6 %
NEUTROS ABS: 4.6 10*3/uL (ref 1.4–7.0)
NEUTROS PCT: 67 %
Platelets: 247 10*3/uL (ref 150–379)
RBC: 5.28 x10E6/uL (ref 4.14–5.80)
RDW: 13.6 % (ref 12.3–15.4)
WBC: 7 10*3/uL (ref 3.4–10.8)

## 2014-04-12 LAB — URINALYSIS
Bilirubin, UA: NEGATIVE
Glucose, UA: NEGATIVE
KETONES UA: NEGATIVE
Leukocytes, UA: NEGATIVE
Nitrite, UA: NEGATIVE
PROTEIN UA: NEGATIVE
Specific Gravity, UA: 1.011 (ref 1.005–1.030)
UUROB: 0.2 mg/dL (ref 0.0–1.9)
pH, UA: 6 (ref 5.0–7.5)

## 2014-04-17 ENCOUNTER — Telehealth: Payer: Self-pay | Admitting: Internal Medicine

## 2014-04-17 NOTE — Telephone Encounter (Signed)
Spoke with patient and he states the Gaviscon is not helping.He continues to have RUQ tenderness and dull ache.No diarrhea and only had cramping once. He also has a little nausea. He saw his PCP Dr. Nyoka Cowden and he wrote an rx for Carafate. He has not filled this yet. He is asking if he should try this. He has an increase in pain after eating. Please, advise.

## 2014-04-17 NOTE — Telephone Encounter (Signed)
I agree with adding Carafate to Omeprazole. If no improvement in 4 weeks, consider EGD

## 2014-04-18 MED ORDER — DICYCLOMINE HCL 10 MG PO CAPS: 10.0000 mg | ORAL_CAPSULE | Freq: Three times a day (TID) | ORAL | Status: DC

## 2014-04-18 NOTE — Telephone Encounter (Signed)
Spoke with patient and gave him results. He is not taking Omeprazole due to side effects. Should he try anything else besides the Carafate. Please, advise.

## 2014-04-18 NOTE — Telephone Encounter (Signed)
Spoke with patient and gave him Dr. Brodie's recommendation 

## 2014-04-18 NOTE — Telephone Encounter (Signed)
Take Bentyl 10 mg regularly  tid ac, not prn like initially prescribed.

## 2014-04-19 ENCOUNTER — Encounter (INDEPENDENT_AMBULATORY_CARE_PROVIDER_SITE_OTHER): Payer: Self-pay | Admitting: General Surgery

## 2014-04-19 ENCOUNTER — Ambulatory Visit (INDEPENDENT_AMBULATORY_CARE_PROVIDER_SITE_OTHER): Payer: Medicare Other | Admitting: General Surgery

## 2014-04-19 VITALS — BP 128/70 | HR 76 | Resp 16 | Ht 68.5 in | Wt 188.4 lb

## 2014-04-19 DIAGNOSIS — R1011 Right upper quadrant pain: Secondary | ICD-10-CM

## 2014-04-19 NOTE — Patient Instructions (Signed)
I would suggest you see your urologist about the blood in your urine. Please drop off a copy of the liver tests so I can see them. Avoid physical activities that cause pain in the right upper quadrant area.

## 2014-04-19 NOTE — Progress Notes (Signed)
Subjective:     Patient ID: Alexander [name], male   DOB: [birth year], 78 y.o.   MRN: [id number]  HPI  He is here to discuss some right upper quadrant pain. He is post cholecystectomy about 6 months ago. 2 months after that he was doing some crutches with weights on a machine. He then Developed right upper quadrant discomfort with certain movements. He also has some dyspepsia and is being treated by Dr. [name] for this. He continues to have some microscopic hematuria by his report. He had liver function test checked about 6 weeks ago at the [place].   Review of Systems  No diarrhea. No gross blood in the urine.     Objective:   Physical Exam Gen.-well-developed well-nourished no acute distress.  Abdomen-soft, nontender, multiple small scars that are clean and intact without evidence of hernia.    Assessment:     1. Right upper quadrant musculoskeletal pain likely cause by a strain from doing the crutches. He continues to be fairly active and this continues to aggravate that process.  2. Persistent microscopic hematuria of unknown origin.     Plan:     I recommended avoiding activities that led to pain in the right upper quadrant to allow the muscular strain to heal. I have asked him to get me the liver function tests from the [place]. I recommend he see his urologist regarding his microscopic hematuria. Followup with me when necessary.

## 2014-04-20 ENCOUNTER — Other Ambulatory Visit (INDEPENDENT_AMBULATORY_CARE_PROVIDER_SITE_OTHER): Payer: Self-pay | Admitting: General Surgery

## 2014-04-20 ENCOUNTER — Telehealth: Payer: Self-pay | Admitting: Internal Medicine

## 2014-04-20 ENCOUNTER — Other Ambulatory Visit (INDEPENDENT_AMBULATORY_CARE_PROVIDER_SITE_OTHER): Payer: Self-pay

## 2014-04-20 ENCOUNTER — Telehealth (INDEPENDENT_AMBULATORY_CARE_PROVIDER_SITE_OTHER): Payer: Self-pay

## 2014-04-20 DIAGNOSIS — R1011 Right upper quadrant pain: Secondary | ICD-10-CM

## 2014-04-20 LAB — COMPREHENSIVE METABOLIC PANEL
ALT: 17 U/L (ref 0–53)
AST: 22 U/L (ref 0–37)
Albumin: 4 g/dL (ref 3.5–5.2)
Alkaline Phosphatase: 52 U/L (ref 39–117)
BUN: 19 mg/dL (ref 6–23)
CHLORIDE: 99 meq/L (ref 96–112)
CO2: 27 meq/L (ref 19–32)
Calcium: 9.2 mg/dL (ref 8.4–10.5)
Creat: 0.99 mg/dL (ref 0.50–1.35)
GLUCOSE: 102 mg/dL — AB (ref 70–99)
Potassium: 4.1 mEq/L (ref 3.5–5.3)
SODIUM: 135 meq/L (ref 135–145)
TOTAL PROTEIN: 6.6 g/dL (ref 6.0–8.3)
Total Bilirubin: 1 mg/dL (ref 0.2–1.2)

## 2014-04-20 NOTE — Telephone Encounter (Signed)
Dr. Zella Richer order CMET for patient.  Made aware and will go to [facility] today.

## 2014-04-20 NOTE — Telephone Encounter (Signed)
Patient states the Carafate has made him feel better. When he takes the Bentyl, he feels "jittery, nervous". He will not take the Bentyl and see how he feels.

## 2014-04-22 DIAGNOSIS — I209 Angina pectoris, unspecified: Secondary | ICD-10-CM | POA: Insufficient documentation

## 2014-04-22 NOTE — Progress Notes (Signed)
Patient ID: Alexander [name], male   DOB: September 06, 1936, 78 y.o.   MRN: [id number]    Location:  [facility]   Place of Service: OFFICE    Allergies  Allergen Reactions  . Heparin     Blood clots   . Penicillins Shortness Of Breath  . Aspirin     Breathing complications, able to take three times a week  . Codeine     Some is ok  . Oxycodone     Can not take percocet, palpitations  . Zocor [Simvastatin]     Liver enzymes increased  . Plavix [Clopidogrel Bisulfate] Palpitations    Chief Complaint  Patient presents with  . Medical Management of Chronic Issues    6 month follow-up, no recent labs. Patients main concern is stomach issues - seen GI x 2    . Medication Management    Discuss instructions for diazepam   . Memory Screening    6CIT Dementia Screening completed, Score was 2 (with in normal limits)   . Orders    Discuss order for hemoglobin, last checked on 03/03/14 @ [facility]     HPI:  Unspecified essential hypertension: controlled  CAD (coronary artery disease), native coronary artery: known. Stable.  Other and unspecified angina pectoris: occ chest pain unaccompanied by dyspnea or diaphoresis.  Anxiety state, unspecified: improved. Still using diazepam from time to time  Abdominal pain, right upper quadrant: S/P cholecystectomy 12/12 14. Continues to have some residual pains in the RUQ. He will be seeing his surgeon, Dr. Zella Richer soon. Saw Drt. [name] 04/07/14. She thought there was some epigastric discomfort. Recommended starting omeprazole and possibly sucralfate.  Hematuria, microscopic - denies dysuria. No grossly bloody urine  Hyperlipidemia: controlled in 2014. Needs recheck in future    Medications: Patient's Medications  New Prescriptions   No medications on file  Previous Medications   ALBUTEROL (PROVENTIL HFA;VENTOLIN HFA) 108 (90 BASE) MCG/ACT INHALER    Inhale 2 puffs into the lungs every 6 (six) hours as needed for wheezing.   ALPRAZOLAM (XANAX)  0.5 MG TABLET    Take 0.5 mg by mouth. 1-2 by mouth daily   AMITRIPTYLINE (ELAVIL) 25 MG TABLET    Take 6.25 mg by mouth at bedtime. Takes 1/4 tablet   ASPIRIN 81 MG TABLET    Take 81 mg by mouth daily.   CHOLECALCIFEROL (VITAMIN D) 1000 UNITS TABLET    Take 1,000 Units by mouth daily.   CYCLOBENZAPRINE (FLEXERIL) 10 MG TABLET    Take 2.5 mg by mouth 3 (three) times daily as needed for muscle spasms. Takes 1/4 tablet when needed for spasms   DIAZEPAM (VALIUM) 5 MG TABLET    TAKE 1 TABLET BY MOUTH UP TO FOUR TIMES DAILY FOR ANXIETY   FLAXSEED, LINSEED, 1000 MG CAPS    Take 1 capsule by mouth daily.   MULTIPLE VITAMINS-MINERALS (CENTRUM SILVER PO)    Take 1 capsule by mouth daily.   ROSUVASTATIN (CRESTOR) 10 MG TABLET    Take 5 mg by mouth at bedtime.    SULINDAC (CLINORIL) 200 MG TABLET    Take 200 mg by mouth daily as needed (pain). Usually takes 4 times per week  Modified Medications   Modified Medication Previous Medication   DICYCLOMINE (BENTYL) 10 MG CAPSULE dicyclomine (BENTYL) 10 MG capsule      Take 1 capsule (10 mg total) by mouth 3 (three) times daily before meals. Patient takes as needed per patient    Take 10  mg by mouth. Patient takes as needed per patient  Discontinued Medications   No medications on file     Review of Systems  Constitutional: Negative for fever, chills, activity change, appetite change, fatigue and unexpected weight change.  HENT: Positive for hearing loss. Negative for congestion and ear pain.   Eyes: Negative.   Respiratory: Negative.  Negative for choking, chest tightness and shortness of breath.   Cardiovascular: Negative.  Negative for chest pain, palpitations and leg swelling.  Gastrointestinal: Negative for nausea and diarrhea.       RUQ discomfort. Had cholecystectomy in Dec 2014.  Endocrine: Negative.   Genitourinary: Negative.   Musculoskeletal: Negative.   Skin: Negative.   Allergic/Immunologic: Negative.   Neurological: Negative.     Hematological: Negative.   Psychiatric/Behavioral: Negative.     Filed Vitals:   04/11/14 1503  BP: 130/70  Pulse: 75  Temp: 98.1 F (36.7 C)  TempSrc: Oral  Height: 5' 8.5" (1.74 m)  Weight: 189 lb (85.73 kg)  SpO2: 97%   Body mass index is 28.32 kg/(m^2).  Physical Exam  Nursing note and vitals reviewed. Constitutional: He appears well-developed and well-nourished. No distress.  HENT:  Head: Normocephalic and atraumatic.  Right Ear: External ear normal.  Left Ear: External ear normal.  Nose: Nose normal.  Mouth/Throat: Oropharynx is clear and moist.  Dentures. Missing some teeth.  Eyes: Conjunctivae and EOM are normal. Pupils are equal, round, and reactive to light.  Neck: No JVD present. No tracheal deviation present. No thyromegaly present.  Cardiovascular: Normal rate, regular rhythm, normal heart sounds and intact distal pulses.  Exam reveals no gallop and no friction rub.   No murmur heard. Pulmonary/Chest: No respiratory distress. He has no wheezes. He has no rales. He exhibits no tenderness.  Abdominal: He exhibits no distension and no mass. There is no tenderness. There is no rebound and no guarding. No hernia.  post laparoscopic cholecystectomy scars.   Genitourinary:  Enlarged prostate  Musculoskeletal: He exhibits no edema and no tenderness.  Neurological: He displays normal reflexes. No cranial nerve deficit. Coordination normal.  Skin: No rash noted. No erythema. No pallor.  Psychiatric: He has a normal mood and affect. His behavior is normal. Judgment and thought content normal.     Labs reviewed: Office Visit on 04/11/2014  Component Date Value Ref Range Status  . Specific Gravity, UA 04/11/2014 1.011  1.005 - 1.030 Final  . pH, UA 04/11/2014 6.0  5.0 - 7.5 Final  . Color, UA 04/11/2014 Yellow  Yellow Final  . Appearance Ur 04/11/2014 Clear  Clear Final  . Leukocytes, UA 04/11/2014 Negative  Negative Final  . Protein, UA 04/11/2014 Negative   Negative/Trace Final  . Glucose, UA 04/11/2014 Negative  Negative Final  . Ketones, UA 04/11/2014 Negative  Negative Final  . RBC, UA 04/11/2014 Trace* Negative Final  . Bilirubin, UA 04/11/2014 Negative  Negative Final  . Urobilinogen, Ur 04/11/2014 0.2  0.0 - 1.9 mg/dL Final  . Nitrite, UA 04/11/2014 Negative  Negative Final  . WBC 04/11/2014 7.0  3.4 - 10.8 x10E3/uL Final  . RBC 04/11/2014 5.28  4.14 - 5.80 x10E6/uL Final  . Hemoglobin 04/11/2014 16.5  12.6 - 17.7 g/dL Final  . HCT 04/11/2014 47.9  37.5 - 51.0 % Final  . MCV 04/11/2014 91  79 - 97 fL Final  . MCH 04/11/2014 31.3  26.6 - 33.0 pg Final  . MCHC 04/11/2014 34.4  31.5 - 35.7 g/dL Final  . RDW  04/11/2014 13.6  12.3 - 15.4 % Final  . Platelets 04/11/2014 247  150 - 379 x10E3/uL Final  . Neutrophils Relative % 04/11/2014 67   Final  . Lymphs 04/11/2014 26   Final  . Monocytes 04/11/2014 6   Final  . Eos 04/11/2014 1   Final  . Basos 04/11/2014 0   Final  . Neutrophils Absolute 04/11/2014 4.6  1.4 - 7.0 x10E3/uL Final  . Lymphocytes Absolute 04/11/2014 1.8  0.7 - 3.1 x10E3/uL Final  . Monocytes Absolute 04/11/2014 0.5  0.1 - 0.9 x10E3/uL Final  . Eosinophils Absolute 04/11/2014 0.1  0.0 - 0.4 x10E3/uL Final  . Basophils Absolute 04/11/2014 0.0  0.0 - 0.2 x10E3/uL Final  . Immature Granulocytes 04/11/2014 0   Final  . Immature Grans (Abs) 04/11/2014 0.0  0.0 - 0.1 x10E3/uL Final      Assessment/Plan  1. Unspecified essential hypertension controlled  2. CAD (coronary artery disease), native coronary artery stavble  3. Other and unspecified angina pectoris occ chest pain. Not sure if it is angina. He does not use NTG. More l;ike;ly to chest wall pain.  4. Anxiety state, unspecified Improve. Still benefits from occ use of diazepam.  5. Abdominal pain, right upper quadrant Post cholecystectomy. ? Adhesions. ? Bile reflux gastritis. Add sucralfate.  6. Hematuria, microscopic - Urinalysis - CBC With  differential/Platelet  7. Hyperlipidemia Lipids in future

## 2014-05-16 ENCOUNTER — Telehealth: Payer: Self-pay | Admitting: Internal Medicine

## 2014-05-16 NOTE — Telephone Encounter (Signed)
Spoke with patient and he has been taking Carafate for 1 month, occasional Gaviscon and Bentyl as needed. He states he still has a "grumpy stomach." He is asking if he needs to have an EGD since he is not totally better. Please, advise.

## 2014-05-16 NOTE — Telephone Encounter (Signed)
I agree that he should have EGD at this point.

## 2014-05-17 NOTE — Telephone Encounter (Signed)
I spoke with the patent.  He is scheduled for 06/21/14 1:30 in the [facility] and 6/30 for a pre-visit

## 2014-05-22 ENCOUNTER — Telehealth: Payer: Self-pay | Admitting: Internal Medicine

## 2014-05-22 NOTE — Telephone Encounter (Signed)
Patient is asking if he can be moved up for EGD. No appointments available currently. Placed patient on wait list.

## 2014-05-23 ENCOUNTER — Ambulatory Visit (AMBULATORY_SURGERY_CENTER): Payer: Self-pay

## 2014-05-23 VITALS — Ht 68.5 in | Wt 186.8 lb

## 2014-05-23 DIAGNOSIS — K219 Gastro-esophageal reflux disease without esophagitis: Secondary | ICD-10-CM

## 2014-05-23 NOTE — Progress Notes (Signed)
No allergies to eggs or soy No home oxygen No diet/weight loss meds No past problems with anesthesia  No email 

## 2014-05-24 ENCOUNTER — Encounter: Payer: Self-pay | Admitting: Internal Medicine

## 2014-05-24 ENCOUNTER — Ambulatory Visit (AMBULATORY_SURGERY_CENTER): Payer: Medicare Other | Admitting: Internal Medicine

## 2014-05-24 VITALS — BP 119/77 | HR 58 | Temp 98.2°F | Resp 15 | Ht 68.5 in | Wt 186.0 lb

## 2014-05-24 DIAGNOSIS — A048 Other specified bacterial intestinal infections: Secondary | ICD-10-CM

## 2014-05-24 DIAGNOSIS — K3189 Other diseases of stomach and duodenum: Secondary | ICD-10-CM

## 2014-05-24 DIAGNOSIS — K2970 Gastritis, unspecified, without bleeding: Secondary | ICD-10-CM

## 2014-05-24 DIAGNOSIS — R1013 Epigastric pain: Secondary | ICD-10-CM

## 2014-05-24 DIAGNOSIS — K219 Gastro-esophageal reflux disease without esophagitis: Secondary | ICD-10-CM

## 2014-05-24 DIAGNOSIS — K2990 Gastroduodenitis, unspecified, without bleeding: Secondary | ICD-10-CM

## 2014-05-24 MED ORDER — SODIUM CHLORIDE 0.9 % IV SOLN: 500.0000 mL | INTRAVENOUS | Status: DC

## 2014-05-24 MED ORDER — DICLOFENAC SODIUM 75 MG PO TBEC: 75.0000 mg | DELAYED_RELEASE_TABLET | Freq: Every morning | ORAL | Status: DC

## 2014-05-24 NOTE — Op Note (Signed)
[place]  [address] [place], [id number]   ENDOSCOPY PROCEDURE REPORT  PATIENT: [name], Alexander  MR#: [id number] BIRTHDATE: [birth year] , 56  yrs. old GENDER: Male ENDOSCOPIST: Lafayette Dragon, MD REFERRED BY:  Jeanmarie Hubert, M.D. PROCEDURE DATE:  05/24/2014 PROCEDURE:  EGD w/ biopsy ASA CLASS:     Class II INDICATIONS:  status post laparoscopic cholecystectomy in December 2014 for dyspepsia and cholelithiasis.  Persistent epigastric discomfort and dyspepsia.  Slightly improved with PPI, Bentyl, and Carafate.  History of irritable bowel syndrome. MEDICATIONS: MAC sedation, administered by CRNA and propofol (Diprivan) 150mg  IV TOPICAL ANESTHETIC: none  DESCRIPTION OF PROCEDURE: After the risks benefits and alternatives of the procedure were thoroughly explained, informed consent was obtained.  The LB [id number] endoscope was introduced through the mouth and advanced to the second portion of the duodenum. Without limitations.  The instrument was slowly withdrawn as the mucosa was fully examined.      Esophagus: esophageal mucosa appeared normal in proximal, mid and distal esophagus. The squamocolumnar junction was irregular and there were several islands of gastric mucosa proximal to the Z line. Multiple biopsies were obtained to rule out Barrett's esophagus Stomach: the gastric mucosa was diffusely erythematous edematous and reticulated. There were patches of intense erythema but there were no discrete erosions. Biopsies were obtained to rule out H. pylori. Retroflexion of the endoscope confirmed the presence of gastritis Duodenum: duodenal bulb and descending duodenum was normal[ The scope was then withdrawn from the patient and the procedure completed.  COMPLICATIONS: There were no complications. ENDOSCOPIC IMPRESSION:  irregular squamocolumnar junction. Status post biopsies to rule out Barrett's esophagus Diffuse moderately severe  gastritis. Status post multiple biopsies to rule out reactive gastropathy versus H. pylori gastritis. No evidence of bile reflux  RECOMMENDATIONS: 1.  Await biopsy results 2.  Anti-reflux regimen to be follow 3.  Continue current meds- PPI, Carafate, hold Bentyl 4. review pt's other  meds as to causing gastritis  REPEAT EXAM: for EGD pending biopsy results.  eSigned:  Lafayette Dragon, MD 05/24/2014 3:58 PM   CC:  PATIENT NAME:  Alexander [name], Alexander [name] MR#: [id number]

## 2014-05-24 NOTE — Progress Notes (Signed)
Called to room to assist during endoscopic procedure.  Patient ID and intended procedure confirmed with present staff. Received instructions for my participation in the procedure from the performing physician.  

## 2014-05-24 NOTE — Progress Notes (Signed)
Report to PACU, RN, vss, BBS= Clear.  

## 2014-05-24 NOTE — Patient Instructions (Signed)
YOU HAD AN ENDOSCOPIC PROCEDURE TODAY AT [facility]: Refer to the procedure report that was given to you for any specific questions about what was found during the examination.  If the procedure report does not answer your questions, please call your gastroenterologist to clarify.  If you requested that your care partner not be given the details of your procedure findings, then the procedure report has been included in a sealed envelope for you to review at your convenience later.  YOU SHOULD EXPECT: Some feelings of bloating in the abdomen. Passage of more gas than usual.  Walking can help get rid of the air that was put into your GI tract during the procedure and reduce the bloating. If you had a lower endoscopy (such as a colonoscopy or flexible sigmoidoscopy) you may notice spotting of blood in your stool or on the toilet paper. If you underwent a bowel prep for your procedure, then you may not have a normal bowel movement for a few days.  DIET: Your first meal following the procedure should be a light meal and then it is ok to progress to your normal diet.  A half-sandwich or bowl of soup is an example of a good first meal.  Heavy or fried foods are harder to digest and may make you feel nauseous or bloated.  Likewise meals heavy in dairy and vegetables can cause extra gas to form and this can also increase the bloating.  Drink plenty of fluids but you should avoid alcoholic beverages for 24 hours.  ACTIVITY: Your care partner should take you home directly after the procedure.  You should plan to take it easy, moving slowly for the rest of the day.  You can resume normal activity the day after the procedure however you should NOT DRIVE or use heavy machinery for 24 hours (because of the sedation medicines used during the test).    SYMPTOMS TO REPORT IMMEDIATELY: A gastroenterologist can be reached at any hour.  During normal business hours, 8:30 AM to 5:00 PM Monday through Friday,  call [phone].  After hours and on weekends, please call the GI answering service at [phone] who will take a message and have the physician on call contact you.  :  Following upper endoscopy (EGD)  Vomiting of blood or coffee ground material  New chest pain or pain under the shoulder blades  Painful or persistently difficult swallowing  New shortness of breath  Fever of 100F or higher  Black, tarry-looking stools  FOLLOW UP: If any biopsies were taken you will be contacted by phone or by letter within the next 1-3 weeks.  Call your gastroenterologist if you have not heard about the biopsies in 3 weeks.  Our staff will call the home number listed on your records the next business day following your procedure to check on you and address any questions or concerns that you may have at that time regarding the information given to you following your procedure. This is a courtesy call and so if there is no answer at the home number and we have not heard from you through the emergency physician on call, we will assume that you have returned to your regular daily activities without incident.   Hold Bentyl, discontinue Clinoril.  Will try Voltaren 75 mg daily.  Gastritis and anti-reflux regimen given.   SIGNATURES/CONFIDENTIALITY: You and/or your care partner have signed paperwork which will be entered into your electronic medical record.  These signatures attest to  the fact that that the information above on your After Visit Summary has been reviewed and is understood.  Full responsibility of the confidentiality of this discharge information lies with you and/or your care-partner.

## 2014-05-25 ENCOUNTER — Telehealth: Payer: Self-pay | Admitting: Internal Medicine

## 2014-05-25 ENCOUNTER — Telehealth: Payer: Self-pay | Admitting: *Deleted

## 2014-05-25 NOTE — Telephone Encounter (Signed)
Spoke with patient and told him he can take both medications.

## 2014-05-25 NOTE — Telephone Encounter (Signed)
  Follow up Call-  Call back number 05/24/2014 06/25/2012  Post procedure Call Back phone  # [phone]  Permission to leave phone message Yes Yes     Patient questions:  Do you have a fever, pain , or abdominal swelling? no Pain Score  0 *  Have you tolerated food without any problems? yes  Have you been able to return to your normal activities? yes  Do you have any questions about your discharge instructions: Diet   yes Medications  no Follow up visit  no  Do you have questions or concerns about your Care? no  Actions: * If pain score is 4 or above: No action needed, pain <4.  Patient questioned if he could return to his regular diet today.

## 2014-05-31 ENCOUNTER — Telehealth: Payer: Self-pay | Admitting: Internal Medicine

## 2014-05-31 ENCOUNTER — Encounter: Payer: Self-pay | Admitting: Cardiology

## 2014-05-31 ENCOUNTER — Encounter: Payer: Self-pay | Admitting: Internal Medicine

## 2014-05-31 NOTE — Telephone Encounter (Signed)
Spoke with patient and told him that Dr. [name] has not reviewed his bx results yet. Discuss when he should take his Carafate and Zantac. He will take his Carafate then wait one hour to take Zantac BID.

## 2014-06-01 ENCOUNTER — Other Ambulatory Visit: Payer: Self-pay | Admitting: *Deleted

## 2014-06-01 ENCOUNTER — Telehealth: Payer: Self-pay | Admitting: *Deleted

## 2014-06-01 NOTE — Telephone Encounter (Signed)
Dr. [name], we gave his wife Biaxin and Amoxicillin. He is allergie to multiple antibiotics including Amoxicillin. Please, advise.

## 2014-06-02 MED ORDER — OMEPRAZOLE 40 MG PO CPDR: DELAYED_RELEASE_CAPSULE | ORAL | Status: DC

## 2014-06-02 MED ORDER — PANTOPRAZOLE SODIUM 40 MG PO TBEC: DELAYED_RELEASE_TABLET | ORAL | Status: DC

## 2014-06-02 MED ORDER — CLARITHROMYCIN 500 MG PO TABS: ORAL_TABLET | ORAL | Status: DC

## 2014-06-02 MED ORDER — METRONIDAZOLE 500 MG PO TABS: ORAL_TABLET | ORAL | Status: DC

## 2014-06-02 NOTE — Telephone Encounter (Signed)
Please try to use Flagyl 500mg  po tid x 10 days ,Bioxin 500 mg po bid x 10 days and PPI bid. X 10 days.

## 2014-06-02 NOTE — Telephone Encounter (Signed)
Patient notified and rx sent. 

## 2014-06-05 ENCOUNTER — Telehealth: Payer: Self-pay | Admitting: Internal Medicine

## 2014-06-05 ENCOUNTER — Telehealth: Payer: Self-pay | Admitting: *Deleted

## 2014-06-05 NOTE — Telephone Encounter (Signed)
Spoke with patient and he did receive the correct dosing. He reports his only side effect is slight sore throat. He wants Dr. [name] to know.

## 2014-06-05 NOTE — Telephone Encounter (Signed)
Patient called back and wants to let Dr. [name] know in addition to scratchy throat, he has a productive cough. (since he started antibiotics for h. Pylori)

## 2014-06-05 NOTE — Telephone Encounter (Signed)
I don't see an association between H.Pylori regimen and onset of cough. If it continues he should consult his PCP.

## 2014-06-06 NOTE — Telephone Encounter (Signed)
Spoke with patient and gave him Dr. Brodie's recommendation 

## 2014-06-07 ENCOUNTER — Telehealth: Payer: Self-pay | Admitting: Internal Medicine

## 2014-06-07 MED ORDER — MAGIC MOUTHWASH: 5.0000 mL | Freq: Four times a day (QID) | ORAL | Status: DC

## 2014-06-07 NOTE — Telephone Encounter (Signed)
Patient calling to report a white coating on his tongue this AM. It does not hurt. Throat is red but not white. Please, advise.

## 2014-06-07 NOTE — Telephone Encounter (Signed)
Patient notified of recommendations. Rx sent to pharmacy. 

## 2014-06-07 NOTE — Telephone Encounter (Signed)
Medications cause that. If it bothers him you may send Majic mouthwash  4 oz 5cc po qid, swish and swallow.

## 2014-06-08 ENCOUNTER — Encounter: Payer: Self-pay | Admitting: Internal Medicine

## 2014-06-08 ENCOUNTER — Ambulatory Visit (INDEPENDENT_AMBULATORY_CARE_PROVIDER_SITE_OTHER): Payer: Medicare Other | Admitting: Internal Medicine

## 2014-06-08 VITALS — BP 130/72 | HR 88 | Temp 97.7°F | Wt 188.0 lb

## 2014-06-08 DIAGNOSIS — J329 Chronic sinusitis, unspecified: Secondary | ICD-10-CM

## 2014-06-08 DIAGNOSIS — A048 Other specified bacterial intestinal infections: Secondary | ICD-10-CM

## 2014-06-08 DIAGNOSIS — B9789 Other viral agents as the cause of diseases classified elsewhere: Secondary | ICD-10-CM

## 2014-06-08 DIAGNOSIS — B9681 Helicobacter pylori [H. pylori] as the cause of diseases classified elsewhere: Secondary | ICD-10-CM

## 2014-06-08 DIAGNOSIS — B370 Candidal stomatitis: Secondary | ICD-10-CM

## 2014-06-08 DIAGNOSIS — K2970 Gastritis, unspecified, without bleeding: Secondary | ICD-10-CM

## 2014-06-08 DIAGNOSIS — K2940 Chronic atrophic gastritis without bleeding: Secondary | ICD-10-CM

## 2014-06-08 NOTE — Patient Instructions (Signed)
Keep drinking plenty of fluids. Use nasal sinus rinse kit for any persistent congestion.  Also, take yogurt daily while on antibiotics to help with the thrush and buy a tongue scraper.

## 2014-06-08 NOTE — Progress Notes (Signed)
Patient ID: Alexander [name], male   DOB: [birth year], 78 y.o.   MRN: [id number]   Location:  [facility] / [facility]   Allergies  Allergen Reactions  . Heparin     Blood clots   . Penicillins Shortness Of Breath  . Amoxicillin   . Aspirin     Breathing complications, able to take three times a week  . Codeine     Some is ok  . Oxycodone     Can not take percocet, palpitations  . Zetia [Ezetimibe]     jittery  . Zocor [Simvastatin]     Liver enzymes increased  . Plavix [Clopidogrel Bisulfate] Palpitations    Chief Complaint  Patient presents with  . Nasal Congestion    since Monday, nose running, increase cough    HPI: Patient is a 78 y.o. male seen in the office today for a cold since Monday.    Tells me he was treated for h pylori and is better now.  Had moderate to severe gastritis.  Finally EGD done and diagnosed.    Sunday, laid down for 3 hours when tired.  Coughing a productive cough.  Nose a little moist on Monday.  Knew a cold was coming.  Coughed all day Monday.  Tues, nose ran all day, wed back with productive cough and headache over frontal sinuses and head felt terrible at night.  Woke up this morning and feels considerably better.  Feels a little weak around the gills.  Also was sneezing.    Review of Systems:  Review of Systems  Constitutional: Negative for malaise/fatigue.  HENT: Positive for congestion.   Eyes: Negative for blurred vision.  Respiratory: Positive for cough. Negative for shortness of breath.   Cardiovascular: Negative for chest pain.  Gastrointestinal: Negative for heartburn, abdominal pain, constipation, blood in stool and melena.  Genitourinary: Negative for dysuria, urgency and frequency.  Musculoskeletal: Negative for falls.  Skin: Negative for rash.  Neurological: Positive for headaches. Negative for dizziness and weakness.  Endo/Heme/Allergies: Does not bruise/bleed easily.  Psychiatric/Behavioral:  Negative for memory loss. The patient is nervous/anxious.     Past Medical History  Diagnosis Date  . Pneumonia   . Pulmonary embolism 2012    s/p heart surgery  . Hyperlipidemia   . Dysuria   . Clotting disorder   . Difficulty urinating   . Rheumatic fever age 10  . History of anal fissures   . Anxiety   . Diverticulosis of colon (without mention of hemorrhage)     mild  . Internal hemorrhoids without mention of complication   . Allergic rhinitis due to pollen   . History of cholelithiasis   . Edema   . Nonspecific elevation of levels of transaminase or lactic acid dehydrogenase (LDH)   . Long term (current) use of anticoagulants   . Other voice and resonance disorders     after surgery, took breathing tube out and damaged something  . Coronary atherosclerosis of native coronary artery     with CABG 3/12  -MAZE procedure   . Hypertrophy of prostate with urinary obstruction and other lower urinary tract symptoms (LUTS)   . Impotence of organic origin   . Unspecified constipation   . Atrial fibrillation     hx of  . Heart attack 2012  . Anginal pain     once in a while, none recent  . Sleep apnea     no CPAP  . Asthma  exercise induced  . Skin cancer     Hx of squamous cell x2  . Basal cell carcinoma of skin of lip   . Jaundice age 89  . Essential hypertension, benign   . Pulmonary embolism     3/12  . 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 Anxiety disorder     Past Surgical History  Procedure Laterality Date  . Hernia repair  2010    bilateral  . Coronary artery bypass graft  Febuary 13, 2012    full maze   . Scrotum exploration  1990's    multiple  . Mohs surgery      squamus x2  . Vasectomy  04/1992  . Superficial skin cystectomy (l) hip Right 1997    dr [name]  . Hydrocele excision      dr [name]   . Spermatocelectomy  02/1999    Right, Dr.[name]  . Cholecystectomy N/A 11/18/2013    Procedure: LAPAROSCOPIC CHOLECYSTECTOMY With IOC;   Surgeon: Odis Hollingshead, MD;  Location: [facility];  Service: General;  Laterality: N/A;    Social History:   reports that he quit smoking about 36 years ago. His smoking use included Cigarettes. He has a 10.5 pack-year smoking history. He has never used smokeless tobacco. He reports that he drinks alcohol. He reports that he does not use illicit drugs.  Family History  Problem Relation Age of Onset  . Bone cancer Mother   . Diabetes Mother   . Colon cancer Neg Hx   . Esophageal cancer Neg Hx   . Rectal cancer Neg Hx   . Stomach cancer Neg Hx     Medications: Patient's Medications  New Prescriptions   No medications on file  Previous Medications   ALBUTEROL (PROVENTIL HFA;VENTOLIN HFA) 108 (90 BASE) MCG/ACT INHALER    Inhale 2 puffs into the lungs every 6 (six) hours as needed for wheezing.   ALPRAZOLAM (XANAX) 0.5 MG TABLET    Take 0.5 mg by mouth. 1-2 by mouth daily   ALUM & MAG HYDROXIDE-SIMETH (MAGIC MOUTHWASH) SOLN    Take 5 mLs by mouth 4 (four) times daily. Swish and swallow   AMITRIPTYLINE (ELAVIL) 25 MG TABLET    Take 6.25 mg by mouth at bedtime. Takes 1/4 tablet   ASPIRIN 81 MG TABLET    Take 81 mg by mouth daily.   CHOLECALCIFEROL (VITAMIN D) 1000 UNITS TABLET    Take 1,000 Units by mouth daily.   CLARITHROMYCIN (BIAXIN) 500 MG TABLET    Take one po BID x 10 days   CYCLOBENZAPRINE (FLEXERIL) 10 MG TABLET    Take 2.5 mg by mouth 3 (three) times daily as needed for muscle spasms. Takes 1/4 tablet when needed for spasms   DIAZEPAM (VALIUM) 5 MG TABLET    TAKE 1 TABLET BY MOUTH UP TO FOUR TIMES DAILY FOR ANXIETY   DICLOFENAC (VOLTAREN) 75 MG EC TABLET    Take 1 tablet (75 mg total) by mouth every morning.   DICYCLOMINE (BENTYL) 10 MG CAPSULE    Take 1 capsule (10 mg total) by mouth 3 (three) times daily before meals. Patient takes as needed per patient   FLAXSEED, LINSEED, 1000 MG CAPS    Take 1 capsule by mouth daily.   METRONIDAZOLE (FLAGYL) 500 MG TABLET    Take one po TID  for 10 days   MULTIPLE VITAMINS-MINERALS (CENTRUM SILVER PO)    Take 1 capsule by mouth daily.   PANTOPRAZOLE (  PROTONIX) 40 MG TABLET    Take one po BID x 10 days   ROSUVASTATIN (CRESTOR) 10 MG TABLET    Take 5 mg by mouth at bedtime.    SUCRALFATE (CARAFATE) 1 G TABLET       SULINDAC (CLINORIL) 200 MG TABLET    Take 200 mg by mouth daily as needed (pain). Usually takes 4 times per week  Modified Medications   No medications on file  Discontinued Medications   No medications on file     Physical Exam: Filed Vitals:   06/08/14 1109  BP: 130/72  Pulse: 88  Temp: 97.7 F (36.5 C)  TempSrc: Oral  Weight: 188 lb (85.276 kg)  SpO2: 95%  Physical Exam  Constitutional: He is oriented to person, place, and time. He appears well-developed and well-nourished. No distress.  HENT:  Head: Normocephalic and atraumatic.  Oral thrush  Neck: Neck supple.  Cardiovascular: Normal rate, regular rhythm, normal heart sounds and intact distal pulses.   Pulmonary/Chest: Effort normal and breath sounds normal. No respiratory distress.  Abdominal: Soft. Bowel sounds are normal. He exhibits no distension and no mass. There is no tenderness.  Musculoskeletal: Normal range of motion. He exhibits no edema and no tenderness.  Lymphadenopathy:    He has no cervical adenopathy.  Neurological: He is alert and oriented to person, place, and time.  Skin: Skin is warm and dry.  Psychiatric: He has a normal mood and affect.    Labs reviewed: Basic Metabolic Panel:  Recent Labs  07/12/13 0843 11/15/13 1112 04/20/14 0340  NA 140 138 135  K 3.9 4.9 4.1  CL 102 101 99  CO2 24 29 27   GLUCOSE 96 108* 102*  BUN 16 16 19   CREATININE 1.11 1.08 0.99  CALCIUM 9.2 9.4 9.2   Liver Function Tests:  Recent Labs  07/12/13 0843 11/15/13 1112 04/20/14 0340  AST 24 25 22   ALT 16 20 17   ALKPHOS 49 55 52  BILITOT 1.2 1.0 1.0  PROT 6.1 7.1 6.6  ALBUMIN  --  3.8 4.0   No results found for this basename:  LIPASE, AMYLASE,  in the last 8760 hours No results found for this basename: AMMONIA,  in the last 8760 hours CBC:  Recent Labs  11/15/13 1112 04/11/14 1609  WBC 6.9 7.0  NEUTROABS 4.8 4.6  HGB 15.7 16.5  HCT 46.0 47.9  MCV 92.2 91  PLT 236 247   Lipid Panel:  Recent Labs  07/12/13 0843  HDL 54  LDLCALC 91  TRIG 72  CHOLHDL 2.9   Lab Results  Component Value Date   HGBA1C  Value: 6.0 (NOTE)                                                                       According to the ADA Clinical Practice Recommendations for 2011, when HbA1c is used as a screening test:   >=6.5%   Diagnostic of Diabetes Mellitus           (if abnormal result  is confirmed)  5.7-6.4%   Increased risk of developing Diabetes Mellitus  References:Diagnosis and Classification of Diabetes Mellitus,Diabetes IZTI,4580,99(IPJAS 1):S62-S69 and Standards of Medical Care in  Diabetes - 2011,Diabetes Care,2011,34  (Suppl 1):S11-S61.* 01/17/2011   Assessment/Plan 1. Viral sinusitis -seems this is almost resolved -encouraged water intake and rest -may use nettipot or other sinus rinse   2. Helicobacter pylori gastritis -pt feels this is much better since starting clarithromycin and flagyl  3. Oral thrush -on tongue--does not want magic mouthwash so advised to use tongue scraper and eat yogurt -will prescribe magic mouthwash if not getting better  Labs/tests ordered:  none Next appt:  Keep routine visits with Dr. Nyoka Cowden

## 2014-06-14 NOTE — Addendum Note (Signed)
Addended by: Lowry Ram on: 06/14/2014 12:48 PM   Modules accepted: Level of Service

## 2014-06-15 ENCOUNTER — Telehealth: Payer: Self-pay | Admitting: Internal Medicine

## 2014-06-15 NOTE — Telephone Encounter (Signed)
Completed h. Pylori antibiotics. Patient is asking if he needs to take Omeprazole and/or Carafate now. Please, advise.

## 2014-06-15 NOTE — Telephone Encounter (Signed)
Please continue Omeprazole 20 mg po qd, but not Carafate. Follow up as needed in 2-3 months.

## 2014-06-16 NOTE — Telephone Encounter (Signed)
Pt aware.

## 2014-06-21 ENCOUNTER — Encounter: Payer: Medicare Other | Admitting: Internal Medicine

## 2014-06-26 ENCOUNTER — Encounter: Payer: Self-pay | Admitting: *Deleted

## 2014-06-26 ENCOUNTER — Telehealth: Payer: Self-pay | Admitting: Internal Medicine

## 2014-06-26 NOTE — Telephone Encounter (Signed)
Spoke with patient and he states he continues to have a "grumpy stomach" even with the Omeprazole and Carafate. He states the Bentyl does help. He is also eating Activia and taking Probiotics. Scheduled OV with Dr. [name] on 07/07/14 at 2:30 PM to discuss.

## 2014-07-07 ENCOUNTER — Ambulatory Visit: Payer: Medicare Other | Admitting: Internal Medicine

## 2014-07-07 ENCOUNTER — Ambulatory Visit (INDEPENDENT_AMBULATORY_CARE_PROVIDER_SITE_OTHER): Payer: Medicare Other | Admitting: Internal Medicine

## 2014-07-07 ENCOUNTER — Encounter: Payer: Self-pay | Admitting: Internal Medicine

## 2014-07-07 VITALS — BP 120/54 | HR 68 | Ht 68.5 in | Wt 188.6 lb

## 2014-07-07 DIAGNOSIS — A048 Other specified bacterial intestinal infections: Secondary | ICD-10-CM

## 2014-07-07 DIAGNOSIS — R109 Unspecified abdominal pain: Secondary | ICD-10-CM

## 2014-07-07 MED ORDER — HYOSCYAMINE SULFATE ER 0.375 MG PO TB12: 0.3750 mg | ORAL_TABLET | ORAL | Status: DC

## 2014-07-07 NOTE — Patient Instructions (Addendum)
Your physician has requested that you go to the basement for the following lab work before leaving today: Dr Art Nyoka Cowden,  stool for C Diff, [name] Antigen Levbid .375 mg, #30 1 po qam

## 2014-07-07 NOTE — Progress Notes (Signed)
Alexander [name] 13-Oct-1936 [id number]  Note: This dictation was prepared with Dragon digital system. Any transcriptional errors that result from this procedure are unintentional.   History of Present Illness:  This is a 78 year old white male who finished treatment for H. pylori gastritis about 3 weeks ago. He is now 8 months status post laparoscopic cholecystectomy for symptomatic cholelithiasis. He has a history of irritable bowel syndrome. He also has persistent dyspepsia and abdominal pain. He has been under stress since he retired 1.5 years ago because he feels it has been having difficult transition. Bentyl 10 mg helps for 4-5 hours. He is also concerned about incomplete treatment for H. pylori since we could not use amoxicillin due to a penicillin allergy. He has a history of C. difficile colitis but denies having diarrhea.    Past Medical History  Diagnosis Date  . Pneumonia   . Pulmonary embolism 2012    s/p heart surgery  . Hyperlipidemia   . Dysuria   . Clotting disorder   . Difficulty urinating   . Rheumatic fever age 78  . History of anal fissures   . Anxiety   . Diverticulosis of colon (without mention of hemorrhage)     mild  . Internal hemorrhoids without mention of complication   . Allergic rhinitis due to pollen   . History of cholelithiasis   . Edema   . Nonspecific elevation of levels of transaminase or lactic acid dehydrogenase (LDH)   . Long term (current) use of anticoagulants   . Other voice and resonance disorders     after surgery, took breathing tube out and damaged something  . Coronary atherosclerosis of native coronary artery     with CABG 3/12  -MAZE procedure   . Hypertrophy of prostate with urinary obstruction and other lower urinary tract symptoms (LUTS)   . Impotence of organic origin   . Unspecified constipation   . Atrial fibrillation     hx of  . Heart attack 2012  . Anginal pain     once in a while, none recent  . Sleep apnea     no  CPAP  . Asthma     exercise induced  . Skin cancer     Hx of squamous cell x2  . Basal cell carcinoma of skin of lip   . Jaundice age 78  . Essential hypertension, benign   . Pulmonary embolism     3/12  . 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 Anxiety disorder   . Helicobacter pylori (H. pylori)     Past Surgical History  Procedure Laterality Date  . Hernia repair  2010    bilateral  . Coronary artery bypass graft  Febuary 13, 2012    full maze   . Scrotum exploration  1990's    multiple  . Mohs surgery      squamus x2  . Vasectomy  04/1992  . Superficial skin cystectomy (l) hip Right 1997    dr [name]  . Hydrocele excision      dr [name]   . Spermatocelectomy  02/1999    Right, Dr.[name]  . Cholecystectomy N/A 11/18/2013    Procedure: LAPAROSCOPIC CHOLECYSTECTOMY With IOC;  Surgeon: Odis Hollingshead, MD;  Location: [facility];  Service: General;  Laterality: N/A;    Allergies  Allergen Reactions  . Heparin     Blood clots   . Penicillins Shortness Of Breath  . Amoxicillin   . Aspirin  Breathing complications, able to take three times a week  . Codeine     Some is ok  . Oxycodone     Can not take percocet, palpitations  . Zetia [Ezetimibe]     jittery  . Zocor [Simvastatin]     Liver enzymes increased  . Plavix [Clopidogrel Bisulfate] Palpitations    Family history and social history have been reviewed.  Review of Systems: Denies dysphagia heartburn. Positive for abdominal discomfort  The remainder of the 10 point ROS is negative except as outlined in the H&P  Physical Exam: General Appearance Well developed, in no distress Eyes  Non icteric  HEENT  Non traumatic, normocephalic  Mouth No lesion, tongue papillated, no cheilosis Neck Supple without adenopathy, thyroid not enlarged, no carotid bruits, no JVD Lungs Clear to auscultation bilaterally COR Normal S1, normal S2, regular rhythm, no murmur, quiet precordium Abdomen soft relaxed  nontender. Normoactive bowel sounds. No point tympany or distention Rectal not done Extremities  No pedal edema Skin No lesions Neurological Alert and oriented x 3 Psychological Normal mood and affect  Assessment and Plan:   Problem #45 78 year old white male male with vague symptoms of abdominal discomfort consistent with irritable bowel syndrome. His symptoms are definitely improved on Bentyl 10 mg. He just completed treatment for H. pylori gastritis and we will recheck the stools for H. pylori to make sure that the treatment was effective. He also wants [place] to recheck his stool for C. Difficile. We will start him on Levbid 0.375 mg as a long-acting antispasmodic. If the symptoms continue, I will consider CT scan of the abdomen to look for unusual problems.    [name] 07/07/2014

## 2014-07-10 ENCOUNTER — Other Ambulatory Visit: Payer: Medicare Other

## 2014-07-10 DIAGNOSIS — R109 Unspecified abdominal pain: Secondary | ICD-10-CM

## 2014-07-10 DIAGNOSIS — A048 Other specified bacterial intestinal infections: Secondary | ICD-10-CM

## 2014-07-12 LAB — CLOSTRIDIUM DIFFICILE BY PCR: Toxigenic C. Difficile by PCR: NOT DETECTED

## 2014-07-13 ENCOUNTER — Ambulatory Visit: Payer: Medicare Other | Admitting: Nurse Practitioner

## 2014-07-14 ENCOUNTER — Encounter: Payer: Self-pay | Admitting: Internal Medicine

## 2014-07-14 ENCOUNTER — Ambulatory Visit (INDEPENDENT_AMBULATORY_CARE_PROVIDER_SITE_OTHER): Payer: Medicare Other | Admitting: Internal Medicine

## 2014-07-14 VITALS — BP 120/72 | HR 70 | Temp 98.1°F | Resp 18 | Ht 68.5 in | Wt 188.2 lb

## 2014-07-14 DIAGNOSIS — B9681 Helicobacter pylori [H. pylori] as the cause of diseases classified elsewhere: Secondary | ICD-10-CM

## 2014-07-14 DIAGNOSIS — I10 Essential (primary) hypertension: Secondary | ICD-10-CM

## 2014-07-14 DIAGNOSIS — K2940 Chronic atrophic gastritis without bleeding: Secondary | ICD-10-CM

## 2014-07-14 DIAGNOSIS — K2970 Gastritis, unspecified, without bleeding: Secondary | ICD-10-CM

## 2014-07-14 DIAGNOSIS — A048 Other specified bacterial intestinal infections: Secondary | ICD-10-CM

## 2014-07-14 DIAGNOSIS — L539 Erythematous condition, unspecified: Secondary | ICD-10-CM

## 2014-07-14 NOTE — Patient Instructions (Signed)
If you develop increased redness, pain, warmth, or drainage from your toe, give [place] a call.  You can apply topical antibiotics to the area if any of those symptoms happen, but you may need an oral antibiotic.  Avoid wearing the shoes that brought it on.

## 2014-07-14 NOTE — Progress Notes (Signed)
Patient ID: Alexander [name], male   DOB: [birth year], 78 y.o.   MRN: [id number]   Location:  [facility] / [facility]   Allergies  Allergen Reactions  . Heparin     Blood clots   . Penicillins Shortness Of Breath  . Amoxicillin   . Aspirin     Breathing complications, able to take three times a week  . Codeine     Some is ok  . Oxycodone     Can not take percocet, palpitations  . Zetia [Ezetimibe]     jittery  . Zocor [Simvastatin]     Liver enzymes increased  . Plavix [Clopidogrel Bisulfate] Palpitations    Chief Complaint  Patient presents with  . Acute Visit    rt big toe puffy and red since wore new shoes     HPI: Patient is a 78 y.o. white male seen in the office today for acute visit due to right great toe swelling and erythema after wearing new shoes.  Tells me it is better than it was when he made the appt.  New running shoes wed night and walked 3 miles.  Toe got inflamed pink and red around his toenail.  No drainage, no pain.    Got treated for H. pylori.  His wife also had so he wondered if it is transmitted through kissing.  Has h pylori stool test pending--shows collected.    Review of Systems:  Review of Systems  Constitutional: Negative for fever and chills.  Eyes: Negative for blurred vision.  Respiratory: Negative for shortness of breath.   Cardiovascular: Negative for chest pain.  Gastrointestinal: Positive for heartburn. Negative for abdominal pain, diarrhea, constipation, blood in stool and melena.  Genitourinary: Negative for dysuria.  Musculoskeletal: Negative for falls.  Skin:       Redness of right great toe  Neurological: Negative for headaches.     Past Medical History  Diagnosis Date  . Pneumonia   . Pulmonary embolism 2012    s/p heart surgery  . Hyperlipidemia   . Dysuria   . Clotting disorder   . Difficulty urinating   . Rheumatic fever age 45  . History of anal fissures   . Anxiety   . Diverticulosis  of colon (without mention of hemorrhage)     mild  . Internal hemorrhoids without mention of complication   . Allergic rhinitis due to pollen   . History of cholelithiasis   . Edema   . Nonspecific elevation of levels of transaminase or lactic acid dehydrogenase (LDH)   . Long term (current) use of anticoagulants   . Other voice and resonance disorders     after surgery, took breathing tube out and damaged something  . Coronary atherosclerosis of native coronary artery     with CABG 3/12  -MAZE procedure   . Hypertrophy of prostate with urinary obstruction and other lower urinary tract symptoms (LUTS)   . Impotence of organic origin   . Unspecified constipation   . Atrial fibrillation     hx of  . Heart attack 2012  . Anginal pain     once in a while, none recent  . Sleep apnea     no CPAP  . Asthma     exercise induced  . Skin cancer     Hx of squamous cell x2  . Basal cell carcinoma of skin of lip   . Jaundice age 35  . Essential hypertension, benign   .  Pulmonary embolism     3/12  . 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 Anxiety disorder   . Helicobacter pylori (H. pylori)     Past Surgical History  Procedure Laterality Date  . Hernia repair  2010    bilateral  . Coronary artery bypass graft  Febuary 13, 2012    full maze   . Scrotum exploration  1990's    multiple  . Mohs surgery      squamus x2  . Vasectomy  04/1992  . Superficial skin cystectomy (l) hip Right 1997    dr [name]  . Hydrocele excision      dr [name]   . Spermatocelectomy  02/1999    Right, Dr.[name]  . Cholecystectomy N/A 11/18/2013    Procedure: LAPAROSCOPIC CHOLECYSTECTOMY With IOC;  Surgeon: Odis Hollingshead, MD;  Location: [facility];  Service: General;  Laterality: N/A;    Social History:   reports that he quit smoking about 36 years ago. His smoking use included Cigarettes. He has a 10.5 pack-year smoking history. He has never used smokeless tobacco. He reports that he drinks  alcohol. He reports that he does not use illicit drugs.  Family History  Problem Relation Age of Onset  . Bone cancer Mother   . Diabetes Mother   . Colon cancer Neg Hx   . Esophageal cancer Neg Hx   . Rectal cancer Neg Hx   . Stomach cancer Neg Hx     Medications: Patient's Medications  New Prescriptions   No medications on file  Previous Medications   ALBUTEROL (PROVENTIL HFA;VENTOLIN HFA) 108 (90 BASE) MCG/ACT INHALER    Inhale 2 puffs into the lungs every 6 (six) hours as needed for wheezing.   ALPRAZOLAM (XANAX) 0.5 MG TABLET    Take 0.5 mg by mouth. 1-2 by mouth daily   ALUM & MAG HYDROXIDE-SIMETH (MAGIC MOUTHWASH) SOLN    Take 5 mLs by mouth 4 (four) times daily. Swish and swallow   AMITRIPTYLINE (ELAVIL) 25 MG TABLET    Take 6.25 mg by mouth at bedtime. Takes 1/4 tablet   ASPIRIN 81 MG TABLET    Take 81 mg by mouth daily.   CHOLECALCIFEROL (VITAMIN D) 1000 UNITS TABLET    Take 1,000 Units by mouth daily.   CYCLOBENZAPRINE (FLEXERIL) 10 MG TABLET    Take 2.5 mg by mouth 3 (three) times daily as needed for muscle spasms. Takes 1/4 tablet when needed for spasms   DIAZEPAM (VALIUM) 5 MG TABLET    TAKE 1 TABLET BY MOUTH UP TO FOUR TIMES DAILY FOR ANXIETY   DICLOFENAC (VOLTAREN) 75 MG EC TABLET    Take 1 tablet (75 mg total) by mouth every morning.   DICYCLOMINE (BENTYL) 10 MG CAPSULE    Take 1 capsule (10 mg total) by mouth 3 (three) times daily before meals. Patient takes as needed per patient   FLAXSEED, LINSEED, 1000 MG CAPS    Take 1 capsule by mouth daily.   HYDROCORTISONE (CORTEF) 20 MG TABLET       HYOSCYAMINE (LEVBID) 0.375 MG 12 HR TABLET    Take 1 tablet (0.375 mg total) by mouth every morning.   MULTIPLE VITAMINS-MINERALS (CENTRUM SILVER PO)    Take 1 capsule by mouth daily.   PANTOPRAZOLE (PROTONIX) 40 MG TABLET    Take one po BID x 10 days   ROSUVASTATIN (CRESTOR) 10 MG TABLET    Take 5 mg by mouth at bedtime.  SUCRALFATE (CARAFATE) 1 G TABLET       SULINDAC  (CLINORIL) 200 MG TABLET    Take 200 mg by mouth daily as needed (pain). Usually takes 4 times per week   TAMSULOSIN (FLOMAX) 0.4 MG CAPS CAPSULE      Modified Medications   No medications on file  Discontinued Medications   No medications on file     Physical Exam: Filed Vitals:   07/14/14 1111  BP: 120/72  Pulse: 70  Temp: 98.1 F (36.7 C)  TempSrc: Oral  Resp: 18  Height: 5' 8.5" (1.74 m)  Weight: 188 lb 3.2 oz (85.367 kg)  SpO2: 97%  Physical Exam  Constitutional: He is oriented to person, place, and time. He appears well-developed and well-nourished. No distress.  Cardiovascular: Normal rate, regular rhythm, normal heart sounds and intact distal pulses.   Pulmonary/Chest: Effort normal and breath sounds normal. No respiratory distress.  Abdominal: Soft. Bowel sounds are normal. He exhibits no distension and no mass. There is no tenderness.  Musculoskeletal: Normal range of motion.  Neurological: He is alert and oriented to person, place, and time.  Skin: Skin is warm and dry.  Mild erythema of right great toe, nontender, slightly warm, normal ROM, no open areas or drainage, does have onychomycosis of distal portion of the toenail    Labs reviewed: Basic Metabolic Panel:  Recent Labs  11/15/13 1112 04/20/14 0340  NA 138 135  K 4.9 4.1  CL 101 99  CO2 29 27  GLUCOSE 108* 102*  BUN 16 19  CREATININE 1.08 0.99  CALCIUM 9.4 9.2   Liver Function Tests:  Recent Labs  11/15/13 1112 04/20/14 0340  AST 25 22  ALT 20 17  ALKPHOS 55 52  BILITOT 1.0 1.0  PROT 7.1 6.6  ALBUMIN 3.8 4.0   No results found for this basename: LIPASE, AMYLASE,  in the last 8760 hours No results found for this basename: AMMONIA,  in the last 8760 hours CBC:  Recent Labs  11/15/13 1112 04/11/14 1609  WBC 6.9 7.0  NEUTROABS 4.8 4.6  HGB 15.7 16.5  HCT 46.0 47.9  MCV 92.2 91  PLT 236 247   Lipid Panel: No results found for this basename: CHOL, HDL, LDLCALC, TRIG, CHOLHDL,  LDLDIRECT,  in the last 8760 hours Lab Results  Component Value Date   HGBA1C  Value: 6.0 (NOTE)                                                                       According to the ADA Clinical Practice Recommendations for 2011, when HbA1c is used as a screening test:   >=6.5%   Diagnostic of Diabetes Mellitus           (if abnormal result  is confirmed)  5.7-6.4%   Increased risk of developing Diabetes Mellitus  References:Diagnosis and Classification of Diabetes Mellitus,Diabetes QMVH,8469,62(XBMWU 1):S62-S69 and Standards of Medical Care in         Diabetes - 2011,Diabetes Care,2011,34  (Suppl 1):S11-S61.* 01/17/2011   Assessment/Plan 1. Erythema of toe -does not appear to have infection -looks like his new running shoes caused irritation around the nail bed with pressure -advised to call back if he has increased redness, swelling, tenderness,  fever, chills, open area with drainage at which point he can apply triple antibiotic ointment until we prescribe him antibiotics (paronychia)  2. Helicobacter pylori gastritis -seems this has resolved after completing abx  course -he is waiting for the results of the f/u stool study that was ordered (still pending)  3. Unspecified essential hypertension -bp is at goal with regular exercise and diet, plus flomax for his bph  Labs/tests ordered:none Next appt:  Keep as scheduled with Dr. Nyoka Cowden and prn

## 2014-07-17 ENCOUNTER — Telehealth: Payer: Self-pay | Admitting: Internal Medicine

## 2014-07-17 DIAGNOSIS — A048 Other specified bacterial intestinal infections: Secondary | ICD-10-CM

## 2014-07-17 NOTE — Telephone Encounter (Signed)
[facility] reports that they never received the stool study for h pylori stool antigen.  Patient notified that he will need to resubmit another specimen.  He verbalized understanding.

## 2014-07-18 ENCOUNTER — Telehealth: Payer: Self-pay | Admitting: Internal Medicine

## 2014-07-18 NOTE — Telephone Encounter (Signed)
Spoke with patient and he is instructed to hold Omeprazole x 2 weeks prior to h.pylori for stool.

## 2014-07-31 ENCOUNTER — Other Ambulatory Visit: Payer: Medicare Other

## 2014-07-31 DIAGNOSIS — A048 Other specified bacterial intestinal infections: Secondary | ICD-10-CM

## 2014-08-01 ENCOUNTER — Telehealth: Payer: Self-pay | Admitting: Internal Medicine

## 2014-08-01 ENCOUNTER — Other Ambulatory Visit: Payer: Self-pay | Admitting: *Deleted

## 2014-08-01 LAB — HELICOBACTER PYLORI  SPECIAL ANTIGEN: H. PYLORI ANTIGEN STOOL: POSITIVE

## 2014-08-01 MED ORDER — PANTOPRAZOLE SODIUM 40 MG PO TBEC: DELAYED_RELEASE_TABLET | ORAL | Status: DC

## 2014-08-01 MED ORDER — CLARITHROMYCIN 500 MG PO TABS: ORAL_TABLET | ORAL | Status: DC

## 2014-08-01 MED ORDER — METRONIDAZOLE 250 MG PO TABS: ORAL_TABLET | ORAL | Status: DC

## 2014-08-01 MED ORDER — OMEPRAZOLE 40 MG PO CPDR: DELAYED_RELEASE_CAPSULE | ORAL | Status: DC

## 2014-08-01 NOTE — Telephone Encounter (Signed)
Spoke with patient again about his medications.

## 2014-08-02 NOTE — Telephone Encounter (Signed)
Spoke with patient and confirmed his Flagyl dose is 250 mg.

## 2014-08-09 ENCOUNTER — Telehealth: Payer: Self-pay | Admitting: Internal Medicine

## 2014-08-09 NOTE — Telephone Encounter (Signed)
Spoke with patient and he is having night sweats. He states his head is sweating at night. He is not having drenching sweats just his head is sweating. He will call his PCP if the sweats become drenching.

## 2014-08-15 ENCOUNTER — Telehealth: Payer: Self-pay | Admitting: Internal Medicine

## 2014-08-15 NOTE — Telephone Encounter (Signed)
Patient has completed H. Pylori regimen today. He will take Omeprazole prn like he did prior to the regimen. He will let us know if he has further problems.

## 2014-08-29 ENCOUNTER — Telehealth: Payer: Self-pay | Admitting: Internal Medicine

## 2014-08-29 NOTE — Telephone Encounter (Signed)
Spoke with patient and told him he has to be off antibiotics 4 weeks and PPI 2 weeks prior to rechecking for H. Pylori. He is interested in rechecking for H. Pylori when enough time has elapsed. Dr. [name], is it ok to order h. Pylori stool antigen for recheck?

## 2014-08-29 NOTE — Telephone Encounter (Signed)
Left a message for patient to call back. 

## 2014-08-29 NOTE — Telephone Encounter (Signed)
He needs to be off antibiotics x 2 weeks,

## 2014-09-05 ENCOUNTER — Telehealth: Payer: Self-pay | Admitting: Internal Medicine

## 2014-09-05 NOTE — Telephone Encounter (Signed)
He has to wait till we complete the H.Pylori test. In the meantime he may take Gavisdcon 1 or 2 tabs every 4 hours prn bloating and gas.

## 2014-09-05 NOTE — Telephone Encounter (Signed)
Patient is calling to report he is having a full feeling, bloating and belching. States this started when he was still on antibiotics. He states the "pressure feeling is constant." He is holding his PPI so he can have the repeat H. Pylori testing. He did try Dicyclomine last night and it helped some but the pressure did not go away. Please, advise.

## 2014-09-06 ENCOUNTER — Telehealth: Payer: Self-pay | Admitting: Internal Medicine

## 2014-09-06 NOTE — Telephone Encounter (Signed)
Spoke with patient and gave him Dr. Brodie's recommendations. 

## 2014-09-06 NOTE — Telephone Encounter (Signed)
Spoke with patient and answered his questions. He wishes to schedule an OV with MD. Scheduled on 09/22/14 at 2:30 PM.

## 2014-09-07 ENCOUNTER — Ambulatory Visit (INDEPENDENT_AMBULATORY_CARE_PROVIDER_SITE_OTHER): Payer: Medicare Other | Admitting: Internal Medicine

## 2014-09-07 ENCOUNTER — Encounter: Payer: Self-pay | Admitting: Internal Medicine

## 2014-09-07 VITALS — BP 122/78 | HR 72 | Temp 97.5°F | Resp 10 | Ht 69.5 in | Wt 190.0 lb

## 2014-09-07 DIAGNOSIS — R140 Abdominal distension (gaseous): Secondary | ICD-10-CM

## 2014-09-07 DIAGNOSIS — K2970 Gastritis, unspecified, without bleeding: Secondary | ICD-10-CM

## 2014-09-07 DIAGNOSIS — B9681 Helicobacter pylori [H. pylori] as the cause of diseases classified elsewhere: Secondary | ICD-10-CM

## 2014-09-07 DIAGNOSIS — B370 Candidal stomatitis: Secondary | ICD-10-CM | POA: Insufficient documentation

## 2014-09-07 MED ORDER — NYSTATIN 100000 UNIT/ML MT SUSP: 5.0000 mL | Freq: Four times a day (QID) | OROMUCOSAL | Status: DC

## 2014-09-07 MED ORDER — MAGIC MOUTHWASH: 5.0000 mL | Freq: Four times a day (QID) | ORAL | Status: DC

## 2014-09-07 NOTE — Patient Instructions (Signed)
Use the rest of the magic mouthwash for your thrush and see if it helps your belching.  Continue with the activia to help with the good bacteria in your stomach and bowels.  Also get the gaviscon for the indigestion also that Dr. Ricky Stabs nurse recommended.

## 2014-09-07 NOTE — Progress Notes (Signed)
Patient ID: Alexander [name], male   DOB: 29-Dec-1935, 78 y.o.   MRN: [id number]   Location:  [facility]   Allergies  Allergen Reactions  . Heparin     Blood clots   . Penicillins Shortness Of Breath  . Amoxicillin   . Aspirin     Breathing complications, able to take three times a week  . Codeine     Some is ok  . Oxycodone     Can not take percocet, palpitations  . Zetia [Ezetimibe]     jittery  . Zocor [Simvastatin]     Liver enzymes increased  . Plavix [Clopidogrel Bisulfate] Palpitations    Chief Complaint  Patient presents with  . Acute Visit    bloating, belching, burping x couple weeks   . Immunizations    Patient plans to get flu vaccine at [place] on 10/02/14    HPI: Patient is a 78 y.o.  seen in the office today for acute visit due to bloating, belching and burping for a couple of weeks.    Called Dr. Ricky [name] nurse.  Had [name], completed 10day course of abx.  Was tested 2 wks later, still had h pylori, so took 14 days of abx--clarithromycin and flagyl and ppi.  Stomach is not nearly as "grumpy" and has not had pain in over 3 wks.  Has to wait another month to be retested.  Felt hungry while taking the abx.  After completed, got bloated.  Was not having full bms.  Last 2-2.5 wks partial bms.  Didn't feel right after taking all the abx.  Starting to feel more normal.  Ws told by GI nurse to take gaviscon.  Is going to pick it up on the way home.  Also took maalox.  Bms have been small, but not loose, color normal, not dark.  Had been having a lot of gas until 6-7 d ago, but not having that now, only the belching.  Has been eating activia yogurt.    Did use his asthma inhaler this am due to exercising doing his weights this am.    Review of Systems:  Review of Systems  Constitutional: Positive for malaise/fatigue.  Respiratory: Negative for shortness of breath.   Cardiovascular: Negative for chest pain.  Gastrointestinal:  Positive for abdominal pain. Negative for nausea, vomiting, diarrhea, constipation, blood in stool and melena.       Distention  Genitourinary: Negative for dysuria.  Neurological: Negative for weakness.  Psychiatric/Behavioral: The patient is nervous/anxious.     Past Medical History  Diagnosis Date  . Pneumonia   . Pulmonary embolism 2012    s/p heart surgery  . Hyperlipidemia   . Dysuria   . Clotting disorder   . Difficulty urinating   . Rheumatic fever age 89  . History of anal fissures   . Anxiety   . Diverticulosis of colon (without mention of hemorrhage)     mild  . Internal hemorrhoids without mention of complication   . Allergic rhinitis due to pollen   . History of cholelithiasis   . Edema   . Nonspecific elevation of levels of transaminase or lactic acid dehydrogenase (LDH)   . Long term (current) use of anticoagulants   . Other voice and resonance disorders     after surgery, took breathing tube out and damaged something  . Coronary atherosclerosis of native coronary artery     with CABG 3/12  -MAZE procedure   .  Hypertrophy of prostate with urinary obstruction and other lower urinary tract symptoms (LUTS)   . Impotence of organic origin   . Unspecified constipation   . Atrial fibrillation     hx of  . Heart attack 2012  . Anginal pain     once in a while, none recent  . Sleep apnea     no CPAP  . Asthma     exercise induced  . Skin cancer     Hx of squamous cell x2  . Basal cell carcinoma of skin of lip   . Jaundice age 73  . Essential hypertension, benign   . Pulmonary embolism     3/12  . 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 Anxiety disorder   . Helicobacter pylori (H. pylori)     Past Surgical History  Procedure Laterality Date  . Hernia repair  2010    bilateral  . Coronary artery bypass graft  Febuary 13, 2012    full maze   . Scrotum exploration  1990's    multiple  . Mohs surgery      squamus x2  . Vasectomy  04/1992  .  Superficial skin cystectomy (l) hip Right 1997    dr [name]  . Hydrocele excision      dr [name]   . Spermatocelectomy  02/1999    Right, Dr.[name]  . Cholecystectomy N/A 11/18/2013    Procedure: LAPAROSCOPIC CHOLECYSTECTOMY With IOC;  Surgeon: Odis Hollingshead, MD;  Location: [facility];  Service: General;  Laterality: N/A;    Social History:   reports that he quit smoking about 36 years ago. His smoking use included Cigarettes. He has a 10.5 pack-year smoking history. He has never used smokeless tobacco. He reports that he drinks alcohol. He reports that he does not use illicit drugs.  Family History  Problem Relation Age of Onset  . Bone cancer Mother   . Diabetes Mother   . Colon cancer Neg Hx   . Esophageal cancer Neg Hx   . Rectal cancer Neg Hx   . Stomach cancer Neg Hx     Medications: Patient's Medications  New Prescriptions   No medications on file  Previous Medications   ALBUTEROL (PROVENTIL HFA;VENTOLIN HFA) 108 (90 BASE) MCG/ACT INHALER    Inhale 2 puffs into the lungs every 6 (six) hours as needed for wheezing.   ALPRAZOLAM (XANAX) 0.5 MG TABLET    Take 0.5 mg by mouth. 1-2 by mouth daily   ALUM & MAG HYDROXIDE-SIMETH (MAGIC MOUTHWASH) SOLN    Take 5 mLs by mouth 4 (four) times daily. Swish and swallow   AMITRIPTYLINE (ELAVIL) 25 MG TABLET    Take 6.25 mg by mouth at bedtime. Takes 1/4 tablet   ASPIRIN 81 MG TABLET    Take 81 mg by mouth daily.   CHOLECALCIFEROL (VITAMIN D) 1000 UNITS TABLET    Take 1,000 Units by mouth daily.   CYCLOBENZAPRINE (FLEXERIL) 10 MG TABLET    Take 2.5 mg by mouth 3 (three) times daily as needed for muscle spasms. Takes 1/4 tablet when needed for spasms   DIAZEPAM (VALIUM) 5 MG TABLET    TAKE 1 TABLET BY MOUTH UP TO FOUR TIMES DAILY FOR ANXIETY   DICLOFENAC (VOLTAREN) 75 MG EC TABLET    Take 1 tablet (75 mg total) by mouth every morning.   DICYCLOMINE (BENTYL) 10 MG CAPSULE    Take 1 capsule (10 mg total) by mouth 3 (three) times  daily before  meals. Patient takes as needed per patient   FLAXSEED, LINSEED, 1000 MG CAPS    Take 1 capsule by mouth daily.   HYOSCYAMINE (LEVBID) 0.375 MG 12 HR TABLET    Take 1 tablet (0.375 mg total) by mouth every morning.   MULTIPLE VITAMINS-MINERALS (CENTRUM SILVER PO)    Take 1 capsule by mouth daily.   OMEPRAZOLE (PRILOSEC) 40 MG CAPSULE    Take one po bid x 14 days   ROSUVASTATIN (CRESTOR) 10 MG TABLET    Take 5 mg by mouth at bedtime.    SULINDAC (CLINORIL) 200 MG TABLET    Take 200 mg by mouth daily as needed (pain). Usually takes 4 times per week  Modified Medications   No medications on file  Discontinued Medications   CLARITHROMYCIN (BIAXIN) 500 MG TABLET    Take one po x 14 days   HYDROCORTISONE (CORTEF) 20 MG TABLET       METRONIDAZOLE (FLAGYL) 250 MG TABLET    Take one po QID x 14 days   SUCRALFATE (CARAFATE) 1 G TABLET       TAMSULOSIN (FLOMAX) 0.4 MG CAPS CAPSULE         Physical Exam: Filed Vitals:   09/07/14 1316  BP: 122/78  Pulse: 72  Temp: 97.5 F (36.4 C)  TempSrc: Oral  Resp: 10  Height: 5' 9.5" (1.765 m)  Weight: 190 lb (86.183 kg)  SpO2: 97%  Physical Exam  Constitutional: He is oriented to person, place, and time.  Cardiovascular: Normal rate, regular rhythm and normal heart sounds.   Pulmonary/Chest: Effort normal and breath sounds normal. No respiratory distress.  Abdominal: Soft. Bowel sounds are normal. He exhibits distension. He exhibits no mass. There is no tenderness. There is no rebound and no guarding.  Musculoskeletal: Normal range of motion.  Neurological: He is alert and oriented to person, place, and time.  Skin: Skin is warm and dry.  Psychiatric:  anxious    Labs reviewed: Basic Metabolic Panel:  Recent Labs  11/15/13 1112 04/20/14 0340  NA 138 135  K 4.9 4.1  CL 101 99  CO2 29 27  GLUCOSE 108* 102*  BUN 16 19  CREATININE 1.08 0.99  CALCIUM 9.4 9.2   Liver Function Tests:  Recent Labs  11/15/13 1112 04/20/14 0340  AST 25  22  ALT 20 17  ALKPHOS 55 52  BILITOT 1.0 1.0  PROT 7.1 6.6  ALBUMIN 3.8 4.0  CBC:  Recent Labs  11/15/13 1112 04/11/14 1609  WBC 6.9 7.0  NEUTROABS 4.8 4.6  HGB 15.7 16.5  HCT 46.0 47.9  MCV 92.2 91  PLT 236 247   Lab Results  Component Value Date   HGBA1C  Value: 6.0 (NOTE)                                                                       According to the ADA Clinical Practice Recommendations for 2011, when HbA1c is used as a screening test:   >=6.5%   Diagnostic of Diabetes Mellitus           (if abnormal result  is confirmed)  5.7-6.4%   Increased risk of developing Diabetes Mellitus  References:Diagnosis and Classification of Diabetes  [place],Diabetes VCBS,4967,59(FMBWG 1):S62-S69 and Standards of Medical Care in         Diabetes - 2011,Diabetes YKZL,9357,01  (Suppl 1):S11-S61.* 01/17/2011   Assessment/Plan 1. Gaseous abdominal distention Unclear which part is causing this:   Continued h pylori that has not resolved Candidal esophagitis Loss of good bacteria in the GI tract   2. Helicobacter pylori gastritis Await next result that is planned to be drawn when he sees Dr. [name]  3. Oral thrush Nystatin swish and swallow May be contributing to his belching   Going to get flu shot at [place] as above   Labs/tests ordered:  None today Next appt:  Prn and regular visits with Dr. Nyoka Cowden  Tinleigh Whitmire L. Nakyiah Kuck, D.O. [facility] [address],  [address] Cell Phone (Mon-Fri 8am-5pm):  [phone] On Call:  [phone] & follow prompts after 5pm & weekends Office Phone:  [phone] Office Fax:  [phone]

## 2014-09-11 ENCOUNTER — Telehealth: Payer: Self-pay | Admitting: Internal Medicine

## 2014-09-11 NOTE — Telephone Encounter (Signed)
Patient states he went to his PCP last week and was given medication for thrush. He states he has been taking the Miralax daily or BID since last Wednesday. He states he is only having small bowel movements that are pencil sized. He reports he just does not feel well. Scheduled OV with Dr. [name] on 09/12/14 at 10:15 AM.

## 2014-09-12 ENCOUNTER — Other Ambulatory Visit (INDEPENDENT_AMBULATORY_CARE_PROVIDER_SITE_OTHER): Payer: Medicare Other

## 2014-09-12 ENCOUNTER — Encounter: Payer: Self-pay | Admitting: Internal Medicine

## 2014-09-12 ENCOUNTER — Ambulatory Visit (INDEPENDENT_AMBULATORY_CARE_PROVIDER_SITE_OTHER)
Admission: RE | Admit: 2014-09-12 | Discharge: 2014-09-12 | Disposition: A | Discharge status: Home / Self Care | Payer: Medicare Other | Source: Ambulatory Visit | Attending: Internal Medicine | Admitting: Internal Medicine

## 2014-09-12 ENCOUNTER — Ambulatory Visit (INDEPENDENT_AMBULATORY_CARE_PROVIDER_SITE_OTHER): Payer: Medicare Other | Admitting: Internal Medicine

## 2014-09-12 VITALS — BP 122/58 | HR 76 | Ht 68.5 in | Wt 185.5 lb

## 2014-09-12 DIAGNOSIS — R140 Abdominal distension (gaseous): Secondary | ICD-10-CM

## 2014-09-12 DIAGNOSIS — E785 Hyperlipidemia, unspecified: Secondary | ICD-10-CM

## 2014-09-12 DIAGNOSIS — K589 Irritable bowel syndrome without diarrhea: Secondary | ICD-10-CM

## 2014-09-12 LAB — CBC WITH DIFFERENTIAL/PLATELET
BASOS ABS: 0 10*3/uL (ref 0.0–0.1)
Basophils Relative: 0.3 % (ref 0.0–3.0)
Eosinophils Absolute: 0 10*3/uL (ref 0.0–0.7)
Eosinophils Relative: 0.5 % (ref 0.0–5.0)
HCT: 50.6 % (ref 39.0–52.0)
Hemoglobin: 17.3 g/dL — ABNORMAL HIGH (ref 13.0–17.0)
LYMPHS PCT: 22 % (ref 12.0–46.0)
Lymphs Abs: 1.5 10*3/uL (ref 0.7–4.0)
MCHC: 34.3 g/dL (ref 30.0–36.0)
MCV: 93.3 fl (ref 78.0–100.0)
MONOS PCT: 6.3 % (ref 3.0–12.0)
Monocytes Absolute: 0.4 10*3/uL (ref 0.1–1.0)
NEUTROS ABS: 4.9 10*3/uL (ref 1.4–7.7)
Neutrophils Relative %: 70.9 % (ref 43.0–77.0)
Platelets: 260 10*3/uL (ref 150.0–400.0)
RBC: 5.42 Mil/uL (ref 4.22–5.81)
RDW: 13 % (ref 11.5–15.5)
WBC: 6.9 10*3/uL (ref 4.0–10.5)

## 2014-09-12 NOTE — Progress Notes (Signed)
Alexander [name] [birth year] [id number]  Note: This dictation was prepared with Dragon digital system. Any transcriptional errors that result from this procedure are unintentional.   History of Present Illness: This is a 78 year old white male with known irritable bowel syndrome who underwent a laparoscopic cholecystectomy 10 months ago and has not been well since then. He describes bloating, constipation, diarrhea and difficult evacuation of stool. He also complains of abdominal swelling at night. An upper endoscopy in June 2015 showed chronic active gastritis with H. pylori. He was treated incompletely because of  medication intolerance. His stool H. pylori test was positive on August 24 so he was retreated with Flagyl 250 4 times a day, Biaxin 500 twice a day and a PPI twice daily for 14 days and he is ready to have his stool rechecked for H. pylori. He is very anxious and concerned about possible cancer or an obstruction. His last colonoscopy in July 2013 showed mild diverticulosis and hemorrhoids. His last CT scan of the abdomen in May 2012 showed chronic cholecystitis. Patient has been calling our office at frequent intervals. Between his last appointment in August 25 and today, he has made 10 telephone calls concerning his bloating and change in bowel habits.     Past Medical History  Diagnosis Date  . Pneumonia   . Pulmonary embolism 2012    s/p heart surgery  . Hyperlipidemia   . Dysuria   . Clotting disorder   . Difficulty urinating   . Rheumatic fever age 20  . History of anal fissures   . Anxiety   . Diverticulosis of colon (without mention of hemorrhage)     mild  . Internal hemorrhoids without mention of complication   . Allergic rhinitis due to pollen   . History of cholelithiasis   . Edema   . Nonspecific elevation of levels of transaminase or lactic acid dehydrogenase (LDH)   . Long term (current) use of anticoagulants   . Other voice and resonance disorders     after  surgery, took breathing tube out and damaged something  . Coronary atherosclerosis of native coronary artery     with CABG 3/12  -MAZE procedure   . Hypertrophy of prostate with urinary obstruction and other lower urinary tract symptoms (LUTS)   . Impotence of organic origin   . Unspecified constipation   . Atrial fibrillation     hx of  . Heart attack 2012  . Anginal pain     once in a while, none recent  . Sleep apnea     no CPAP  . Asthma     exercise induced  . Skin cancer     Hx of squamous cell x2  . Basal cell carcinoma of skin of lip   . Jaundice age 41  . Essential hypertension, benign   . Pulmonary embolism     3/12  . 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 Anxiety disorder   . Helicobacter pylori (H. pylori)     Past Surgical History  Procedure Laterality Date  . Hernia repair  2010    bilateral  . Coronary artery bypass graft  Febuary 13, 2012    full maze   . Scrotum exploration  1990's    multiple  . Mohs surgery      squamus x2  . Vasectomy  04/1992  . Superficial skin cystectomy (l) hip Right 1997    dr [name]  . Hydrocele excision  dr [name]   . Spermatocelectomy  02/1999    Right, Dr.[name]  . Cholecystectomy N/A 11/18/2013    Procedure: LAPAROSCOPIC CHOLECYSTECTOMY With IOC;  Surgeon: Odis Hollingshead, MD;  Location: [facility];  Service: General;  Laterality: N/A;    Allergies  Allergen Reactions  . Heparin     Blood clots   . Penicillins Shortness Of Breath  . Amoxicillin   . Aspirin     Breathing complications, able to take three times a week  . Codeine     Some is ok  . Oxycodone     Can not take percocet, palpitations  . Zetia [Ezetimibe]     jittery  . Zocor [Simvastatin]     Liver enzymes increased  . Plavix [Clopidogrel Bisulfate] Palpitations    Family history and social history have been reviewed.  Review of Systems: Bloating, abdominal distention. Change in bowel habits, weight loss 3 pounds  The remainder of  the 10 point ROS is negative except as outlined in the H&P  Physical Exam: General Appearance Well developed, anxious and distressed Eyes  Non icteric  HEENT  Non traumatic, normocephalic  Mouth No lesion, tongue papillated, no cheilosis Neck Supple without adenopathy, thyroid not enlarged, no carotid bruits, no JVD Lungs Clear to auscultation bilaterally COR Normal S1, normal S2, regular rhythm, no murmur, quiet precordium Abdomen protuberant in standing position but flat and relaxed when lying down. Normal active bowel sounds. No tenderness. No tympany. Liver edge at costal margin Rectal small amount of soft yellow Hemoccult-negative stool Extremities  No pedal edema Skin No lesions Neurological Alert and oriented x 3 Psychological Normal mood and affect  Assessment and Plan:   78 year old white male who has had change in  bowel habits secondary to severe irritable bowel syndrome. He complains of constipation, Bentyl may be contributing.Marland Kitchen He also complains of ropey yellow diarrheal stools likely secondary to MiraLax 17 g daily. I have asked him to stop Bentyl and stop MiraLax at this point. We will proceed with CT scan of the abdomen and pelvis to rule out any possibility of intra-abdominal pathology. We will also proceed with gastric emptying scan to rule out gastroparesis since he is describing fullness right after meals. We will also repeat  stool for H. pylori today and obtain CBC, metabolic panel, TSH, amylase lipase. We will also obtain KUB today to rule out fecal impaction which he  Suspects having    Alexander [name] 09/12/2014

## 2014-09-12 NOTE — Patient Instructions (Addendum)
Please discontinue bentyl and miralax for now.  Your provider has requested that you have an abdominal x ray before leaving today. Please go to the basement floor to our Radiology department for the test.  Your physician has requested that you go to the basement for the following lab work before leaving today: CBC, CMET, TSH, Amylase, Lipase, [name] stool antigen ____________________________________________________________________________________________________________________ Alexander [name] have been scheduled for a gastric emptying scan at [facility] on Wednesday 09/27/14 at 7:30 am. Please arrive at least 15 minutes prior to your appointment for registration. Please make certain not to have anything to eat or drink after midnight the night before your test. Hold all stomach medications (ex: Zofran, phenergan, Reglan, Levbid) 48 hours prior to your test. If you need to reschedule your appointment, please contact radiology scheduling at [phone]. _____________________________________________________________________ A gastric-emptying study measures how long it takes for food to move through your stomach. There are several ways to measure stomach emptying. In the most common test, you eat food that contains a small amount of radioactive material. A scanner that detects the movement of the radioactive material is placed over your abdomen to monitor the rate at which food leaves your stomach. This test normally takes about 2 hours to complete. ______________________________________________________________________________________________________________________  Alexander [name] have been scheduled for a CT scan of the abdomen and pelvis at [place] ([address]---this is in the same building as Press photographer).   You are scheduled on 09/13/14  at 1:30 pm. You should arrive 15 minutes prior to your appointment time for registration. Please follow the written instructions below on the day  of your exam:  WARNING: IF YOU ARE ALLERGIC TO IODINE/X-RAY DYE, PLEASE NOTIFY RADIOLOGY IMMEDIATELY AT [phone]! YOU WILL BE GIVEN A 13 HOUR PREMEDICATION PREP.  1) Do not eat or drink anything after 9:30 am (4 hours prior to your test) 2) You have been given 2 bottles of oral contrast to drink. The solution may taste better if refrigerated, but do NOT add ice or any other liquid to this solution. Shake well before drinking.    Drink 1 bottle of contrast @ 11:30 am (2 hours prior to your exam)  Drink 1 bottle of contrast @ 12:30 pm (1 hour prior to your exam)  You may take any medications as prescribed with a small amount of water except for the following: Metformin, Glucophage, Glucovance, Avandamet, Riomet, Fortamet, Actoplus Met, Janumet, Glumetza or Metaglip. The above medications must be held the day of the exam AND 48 hours after the exam.  The purpose of you drinking the oral contrast is to aid in the visualization of your intestinal tract. The contrast solution may cause some diarrhea. Before your exam is started, you will be given a small amount of fluid to drink. Depending on your individual set of symptoms, you may also receive an intravenous injection of x-ray contrast/dye. Plan on being at [facility] for 30 minutes or long, depending on the type of exam you are having performed.  This test typically takes 30-45 minutes to complete.  If you have any questions regarding your exam or if you need to reschedule, you may call the CT department at [phone] between the hours of 8:00 am and 5:00 pm, Monday-Friday.  ________________________________________________________________________ CC:Dr Jeanmarie Hubert

## 2014-09-13 ENCOUNTER — Ambulatory Visit (INDEPENDENT_AMBULATORY_CARE_PROVIDER_SITE_OTHER)
Admission: RE | Admit: 2014-09-13 | Discharge: 2014-09-13 | Disposition: A | Discharge status: Home / Self Care | Payer: Medicare Other | Source: Ambulatory Visit | Attending: Internal Medicine | Admitting: Internal Medicine

## 2014-09-13 ENCOUNTER — Other Ambulatory Visit: Payer: Medicare Other

## 2014-09-13 ENCOUNTER — Other Ambulatory Visit: Payer: Self-pay | Admitting: Internal Medicine

## 2014-09-13 DIAGNOSIS — R140 Abdominal distension (gaseous): Secondary | ICD-10-CM

## 2014-09-13 LAB — COMPREHENSIVE METABOLIC PANEL
ALT: 20 U/L (ref 0–53)
AST: 27 U/L (ref 0–37)
Albumin: 3.8 g/dL (ref 3.5–5.2)
Alkaline Phosphatase: 53 U/L (ref 39–117)
BUN: 16 mg/dL (ref 6–23)
CO2: 25 meq/L (ref 19–32)
CREATININE: 1.2 mg/dL (ref 0.4–1.5)
Calcium: 9.6 mg/dL (ref 8.4–10.5)
Chloride: 101 mEq/L (ref 96–112)
GFR: 65.7 mL/min (ref >60.00)
Glucose, Bld: 93 mg/dL (ref 70–99)
Potassium: 5 mEq/L (ref 3.5–5.1)
Sodium: 138 mEq/L (ref 135–145)
TOTAL PROTEIN: 7.6 g/dL (ref 6.0–8.3)
Total Bilirubin: 1.7 mg/dL — ABNORMAL HIGH (ref 0.2–1.2)

## 2014-09-13 LAB — AMYLASE: Amylase: 35 U/L (ref 27–131)

## 2014-09-13 LAB — LIPASE: LIPASE: 22 U/L (ref 11.0–59.0)

## 2014-09-13 LAB — TSH: TSH: 2.2 u[IU]/mL (ref 0.35–4.50)

## 2014-09-13 MED ORDER — IOHEXOL 300 MG/ML  SOLN
100.0000 mL | Freq: Once | INTRAMUSCULAR | Status: AC | PRN
  Administered 2014-09-13: 100 mL via INTRAVENOUS

## 2014-09-14 LAB — HELICOBACTER PYLORI  SPECIAL ANTIGEN: H. PYLORI Antigen: POSITIVE

## 2014-09-15 ENCOUNTER — Telehealth: Payer: Self-pay | Admitting: Internal Medicine

## 2014-09-15 ENCOUNTER — Other Ambulatory Visit: Payer: Self-pay | Admitting: *Deleted

## 2014-09-15 NOTE — Telephone Encounter (Signed)
Spoke with patient and he has Nystatin from his PCP and will take it.

## 2014-09-18 ENCOUNTER — Encounter (HOSPITAL_COMMUNITY)
Admission: RE | Admit: 2014-09-18 | Discharge: 2014-09-18 | Disposition: A | Discharge status: Home / Self Care | Payer: Medicare Other | Source: Ambulatory Visit | Attending: Diagnostic Radiology | Admitting: Diagnostic Radiology

## 2014-09-18 DIAGNOSIS — R140 Abdominal distension (gaseous): Secondary | ICD-10-CM | POA: Insufficient documentation

## 2014-09-18 MED ORDER — TECHNETIUM TC 99M SULFUR COLLOID
2.2000 | Freq: Once | INTRAVENOUS | Status: AC | PRN
Start: 2014-09-18 — End: 2014-09-18
  Administered 2014-09-18: 2.2 via ORAL

## 2014-09-21 MED ORDER — FENTANYL CITRATE 0.05 MG/ML IJ SOLN
INTRAMUSCULAR | Status: DC
Start: 2014-09-21 — End: 2014-09-21
  Filled 2014-09-21: qty 6

## 2014-09-21 MED ORDER — MIDAZOLAM HCL 2 MG/2ML IJ SOLN
INTRAMUSCULAR | Status: AC
  Filled 2014-09-21: qty 6

## 2014-09-22 ENCOUNTER — Ambulatory Visit: Payer: Medicare Other | Admitting: Internal Medicine

## 2014-09-27 ENCOUNTER — Ambulatory Visit (HOSPITAL_COMMUNITY): Payer: Medicare Other

## 2014-09-29 ENCOUNTER — Telehealth: Payer: Self-pay

## 2014-09-29 NOTE — Telephone Encounter (Signed)
Patient is asking for advice on a pus pocket on his big toe. He wants to know what he can do to treat this I assume. Please call thank you   Best: [phone]

## 2014-10-01 NOTE — Telephone Encounter (Signed)
Pt states that his toe has been draining and now it is looking better.  Advised pt to watch for redness, swelling, and warmness.  If any of these are present then he needs to come in.

## 2014-10-16 ENCOUNTER — Other Ambulatory Visit: Payer: Self-pay

## 2014-10-16 DIAGNOSIS — E785 Hyperlipidemia, unspecified: Secondary | ICD-10-CM

## 2014-10-16 DIAGNOSIS — I10 Essential (primary) hypertension: Secondary | ICD-10-CM

## 2014-10-17 ENCOUNTER — Encounter: Payer: Self-pay | Admitting: Internal Medicine

## 2014-10-17 ENCOUNTER — Other Ambulatory Visit: Payer: Medicare Other

## 2014-10-17 ENCOUNTER — Ambulatory Visit (INDEPENDENT_AMBULATORY_CARE_PROVIDER_SITE_OTHER): Payer: Medicare Other | Admitting: Internal Medicine

## 2014-10-17 VITALS — BP 120/70 | HR 77 | Ht 68.5 in | Wt 188.6 lb

## 2014-10-17 DIAGNOSIS — I10 Essential (primary) hypertension: Secondary | ICD-10-CM

## 2014-10-17 DIAGNOSIS — B9681 Helicobacter pylori [H. pylori] as the cause of diseases classified elsewhere: Secondary | ICD-10-CM

## 2014-10-17 DIAGNOSIS — A048 Other specified bacterial intestinal infections: Secondary | ICD-10-CM

## 2014-10-17 DIAGNOSIS — E785 Hyperlipidemia, unspecified: Secondary | ICD-10-CM

## 2014-10-17 MED ORDER — AMOXICILLIN 500 MG PO TABS: 500.0000 mg | ORAL_TABLET | Freq: Four times a day (QID) | ORAL | Status: DC

## 2014-10-17 MED ORDER — OMEPRAZOLE 40 MG PO CPDR: DELAYED_RELEASE_CAPSULE | ORAL | Status: DC

## 2014-10-17 MED ORDER — CLARITHROMYCIN 500 MG PO TABS: 500.0000 mg | ORAL_TABLET | Freq: Two times a day (BID) | ORAL | Status: DC

## 2014-10-17 NOTE — Progress Notes (Signed)
Alexander [name] September 13, 1936 [id number]  Note: This dictation was prepared with Dragon digital system. Any transcriptional errors that result from this procedure are unintentional.   History of Present Illness:  This is a 78 year old white male with H. Pylori gastropathy. He came to discuss repeating an H. Pylori regimen.  Upper endoscopy in June 2015 showed chronic active gastritis with H. Pylori. He was treated for 7-10 days with a combination of Flagyl, Biaxin and a PPI but tested positive for stool H. Pylori antigen following the treatment. He was then retreated with the same regimen on Flagyl 250 4 times a day, Biaxin twice a day and PPI for 14 days. A repeat stool  For H.pylori  antigen 4 weeks later was again positive. He initially reported an allergy to amoxicillin and penicillin but he now feels that he is not sure that he is allergic to it and would like to try the regimen that includes amoxicillin  His GI symptoms are currently doing well. He had a cholecystectomy one year ago. He has irritable bowel syndrome which has been under good control with probiotics and antispasmodics.    Past Medical History  Diagnosis Date  . Pneumonia   . Pulmonary embolism 2012    s/p heart surgery  . Hyperlipidemia   . Dysuria   . Clotting disorder   . Difficulty urinating   . Rheumatic fever age 72  . History of anal fissures   . Anxiety   . Diverticulosis of colon (without mention of hemorrhage)     mild  . Internal hemorrhoids without mention of complication   . Allergic rhinitis due to pollen   . History of cholelithiasis   . Edema   . Nonspecific elevation of levels of transaminase or lactic acid dehydrogenase (LDH)   . Long term (current) use of anticoagulants   . Other voice and resonance disorders     after surgery, took breathing tube out and damaged something  . Coronary atherosclerosis of native coronary artery     with CABG 3/12  -MAZE procedure   . Hypertrophy of prostate with  urinary obstruction and other lower urinary tract symptoms (LUTS)   . Impotence of organic origin   . Unspecified constipation   . Atrial fibrillation     hx of  . Heart attack 2012  . Anginal pain     once in a while, none recent  . Sleep apnea     no CPAP  . Asthma     exercise induced  . Skin cancer     Hx of squamous cell x2  . Basal cell carcinoma of skin of lip   . Jaundice age 89  . Essential hypertension, benign   . Pulmonary embolism     3/12  . 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 Anxiety disorder   . Helicobacter pylori (H. pylori)     Past Surgical History  Procedure Laterality Date  . Hernia repair  2010    bilateral  . Coronary artery bypass graft  Febuary 13, 2012    full maze   . Scrotum exploration  1990's    multiple  . Mohs surgery      squamus x2  . Vasectomy  04/1992  . Superficial skin cystectomy (l) hip Right 1997    dr [name]  . Hydrocele excision      dr [name]   . Spermatocelectomy  02/1999    Right, Dr.[name]  . Cholecystectomy N/A 11/18/2013  Procedure: LAPAROSCOPIC CHOLECYSTECTOMY With IOC;  Surgeon: Odis Hollingshead, MD;  Location: [facility];  Service: General;  Laterality: N/A;    Allergies  Allergen Reactions  . Heparin     Blood clots   . Penicillins Shortness Of Breath  . Aspirin     Breathing complications, able to take three times a week  . Codeine     Some is ok  . Oxycodone     Can not take percocet, palpitations  . Zetia [Ezetimibe]     jittery  . Zocor [Simvastatin]     Liver enzymes increased  . Plavix [Clopidogrel Bisulfate] Palpitations    Family history and social history have been reviewed.  Review of Systems: denies dysphagia abdominal pain rectal bleeding  The remainder of the 10 point ROS is negative except as outlined in the H&P  Physical Exam: General Appearance Well developed, in no distress Psychological Normal mood and affect  Assessment and Plan:   Problem #76 78 year old white male  with recurrent H. Pylori which was not eradicated by 2 previous regimens. This time, patient is willing to try amoxicillin 500 mg 4 times a day, Biaxin 500 mg twice a day and PPI twice a day for 14 days. We will plan to repeat stool for H. Pylori antigen 4 weeks following the treatment. He will be off PPI's for 4 weeks.    Alexander [name] 10/17/2014

## 2014-10-17 NOTE — Patient Instructions (Addendum)
We have sent the following medications to your pharmacy for you to pick up at your convenience: Amoxicillin biaxin Omeprazole  Your physician has requested that you go to the basement for the following lab work around 11/30/14: [name] stool test  CC:Dr Nyoka Cowden

## 2014-10-18 ENCOUNTER — Ambulatory Visit (INDEPENDENT_AMBULATORY_CARE_PROVIDER_SITE_OTHER): Payer: Medicare Other | Admitting: Internal Medicine

## 2014-10-18 ENCOUNTER — Encounter: Payer: Self-pay | Admitting: Internal Medicine

## 2014-10-18 VITALS — BP 140/70 | HR 69 | Temp 97.3°F | Ht 68.5 in | Wt 188.0 lb

## 2014-10-18 DIAGNOSIS — I25111 Atherosclerotic heart disease of native coronary artery with angina pectoris with documented spasm: Secondary | ICD-10-CM

## 2014-10-18 DIAGNOSIS — F411 Generalized anxiety disorder: Secondary | ICD-10-CM

## 2014-10-18 DIAGNOSIS — B351 Tinea unguium: Secondary | ICD-10-CM

## 2014-10-18 DIAGNOSIS — B9681 Helicobacter pylori [H. pylori] as the cause of diseases classified elsewhere: Secondary | ICD-10-CM

## 2014-10-18 DIAGNOSIS — K2970 Gastritis, unspecified, without bleeding: Secondary | ICD-10-CM

## 2014-10-18 DIAGNOSIS — E785 Hyperlipidemia, unspecified: Secondary | ICD-10-CM

## 2014-10-18 DIAGNOSIS — I10 Essential (primary) hypertension: Secondary | ICD-10-CM

## 2014-10-18 LAB — CBC WITH DIFFERENTIAL/PLATELET
BASOS: 0 %
Basophils Absolute: 0 10*3/uL (ref 0.0–0.2)
EOS ABS: 0.1 10*3/uL (ref 0.0–0.4)
Eos: 1 %
HEMATOCRIT: 48.3 % (ref 37.5–51.0)
HEMOGLOBIN: 17 g/dL (ref 12.6–17.7)
Immature Grans (Abs): 0 10*3/uL (ref 0.0–0.1)
Immature Granulocytes: 0 %
LYMPHS ABS: 2.2 10*3/uL (ref 0.7–3.1)
LYMPHS: 29 %
MCH: 32.3 pg (ref 26.6–33.0)
MCHC: 35.2 g/dL (ref 31.5–35.7)
MCV: 92 fL (ref 79–97)
Monocytes Absolute: 0.5 10*3/uL (ref 0.1–0.9)
Monocytes: 7 %
NEUTROS ABS: 4.7 10*3/uL (ref 1.4–7.0)
Neutrophils Relative %: 63 %
RBC: 5.26 x10E6/uL (ref 4.14–5.80)
RDW: 13.4 % (ref 12.3–15.4)
WBC: 7.6 10*3/uL (ref 3.4–10.8)

## 2014-10-18 LAB — COMPREHENSIVE METABOLIC PANEL
A/G RATIO: 1.7 (ref 1.1–2.5)
ALBUMIN: 4.3 g/dL (ref 3.5–4.8)
ALT: 19 IU/L (ref 0–44)
AST: 25 IU/L (ref 0–40)
Alkaline Phosphatase: 55 IU/L (ref 39–117)
BILIRUBIN TOTAL: 1 mg/dL (ref 0.0–1.2)
BUN/Creatinine Ratio: 13 (ref 10–22)
BUN: 17 mg/dL (ref 8–27)
CALCIUM: 9.5 mg/dL (ref 8.6–10.2)
CO2: 29 mmol/L (ref 18–29)
CREATININE: 1.29 mg/dL — AB (ref 0.76–1.27)
Chloride: 97 mmol/L (ref 97–108)
GFR, EST AFRICAN AMERICAN: 62 mL/min/{1.73_m2} (ref >59)
GFR, EST NON AFRICAN AMERICAN: 53 mL/min/{1.73_m2} — AB (ref >59)
GLOBULIN, TOTAL: 2.5 g/dL (ref 1.5–4.5)
GLUCOSE: 98 mg/dL (ref 65–99)
Potassium: 5 mmol/L (ref 3.5–5.2)
Sodium: 140 mmol/L (ref 134–144)
TOTAL PROTEIN: 6.8 g/dL (ref 6.0–8.5)

## 2014-10-18 LAB — LIPID PANEL
CHOL/HDL RATIO: 3.3 ratio (ref 0.0–5.0)
Cholesterol, Total: 189 mg/dL (ref 100–199)
HDL: 57 mg/dL (ref >39)
LDL Calculated: 115 mg/dL — ABNORMAL HIGH (ref 0–99)
Triglycerides: 84 mg/dL (ref 0–149)
VLDL Cholesterol Cal: 17 mg/dL (ref 5–40)

## 2014-10-18 LAB — TSH: TSH: 3.34 u[IU]/mL (ref 0.450–4.500)

## 2014-10-18 MED ORDER — ALPRAZOLAM 0.5 MG PO TABS: ORAL_TABLET | ORAL | Status: DC

## 2014-10-18 MED ORDER — DIAZEPAM 5 MG PO TABS: ORAL_TABLET | ORAL | Status: DC

## 2014-10-18 MED ORDER — UNDECYLENIC ACID 25 % EX SOLN: CUTANEOUS | Status: DC

## 2014-10-18 NOTE — Progress Notes (Signed)
Patient ID: Alexander [name], male   DOB: 1936-06-11, 77 y.o.   MRN: [id number]     HISTORY AND PHYSICAL  Location:    [facility]   Place of Service:   OFFICE  Extended Emergency Contact Information Primary Emergency Contact: Roemmich,[name] Address: [place], [place] [address] [place] Phone: [phone] Work Phone: [phone] Relation: Spouse   Chief Complaint  Patient presents with  . Annual Exam    Yearly physical, Discuss labs (copy printed)    HPI:  Toenail fungus: left great toe  Helicobacter pylori gastritis: under treatment. Sees Dr. Cristal Generous  Hyperlipidemia: controlled. Mild rise in LDL.  Essential hypertension: controlled  Coronary artery disease involving native coronary artery of native heart with angina pectoris with documented spasm: no recent chest pain or palpitation  Anxiety state - benefits from ALPRAZolam (XANAX) 0.5 MG tablet, diazepam (VALIUM) 5 MG tablet    Past Medical History  Diagnosis Date  . Pneumonia   . Pulmonary embolism 2012    s/p heart surgery  . Hyperlipidemia   . Dysuria   . Clotting disorder   . Difficulty urinating   . Rheumatic fever age 67  . History of anal fissures   . Anxiety   . Diverticulosis of colon (without mention of hemorrhage)     mild  . Internal hemorrhoids without mention of complication   . Allergic rhinitis due to pollen   . History of cholelithiasis   . Edema   . Nonspecific elevation of levels of transaminase or lactic acid dehydrogenase (LDH)   . Long term (current) use of anticoagulants   . Other voice and resonance disorders     after surgery, took breathing tube out and damaged something  . Coronary atherosclerosis of native coronary artery     with CABG 3/12  -MAZE procedure   . Hypertrophy of prostate with urinary obstruction and other lower urinary tract symptoms (LUTS)   . Impotence of organic origin   . Unspecified constipation   .  Atrial fibrillation     hx of  . Heart attack 2012  . Anginal pain     once in a while, none recent  . Sleep apnea     no CPAP  . Asthma     exercise induced  . Skin cancer     Hx of squamous cell x2  . Basal cell carcinoma of skin of lip   . Jaundice age 61  . Essential hypertension, benign   . Pulmonary embolism     3/12  . 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 Anxiety disorder   . Helicobacter pylori (H. pylori)     Past Surgical History  Procedure Laterality Date  . Hernia repair  2010    bilateral  . Coronary artery bypass graft  Febuary 13, 2012    full maze   . Scrotum exploration  1990's    multiple  . Mohs surgery      squamus x2  . Vasectomy  04/1992  . Superficial skin cystectomy (l) hip Right 1997    dr [name]  . Hydrocele excision      dr [name]   . Spermatocelectomy  02/1999    Right, Dr.[name]  . Cholecystectomy N/A 11/18/2013    Procedure: LAPAROSCOPIC CHOLECYSTECTOMY With IOC;  Surgeon: Odis Hollingshead, MD;  Location: [facility];  Service: General;  Laterality: N/A;  Patient Care Team: Estill Dooms, MD as PCP - General (Internal Medicine)  History   Social History  . Marital Status: Married    Spouse Name: N/A    Number of Children: 1  . Years of Education: N/A   Occupational History  . [employer]   Social History Main Topics  . Smoking status: Former Smoker -- 0.50 packs/day for 21 years    Types: Cigarettes    Quit date: 12/08/1977  . Smokeless tobacco: Never Used  . Alcohol Use: Yes     Comment: former heavy alcohol use, rare use  . Drug Use: No  . Sexual Activity:    Partners: Female   Other Topics Concern  . Not on file   Social History Narrative   Daily caffeine       [name]   [name]   [name]   [name]   [name]    [name]   [name]   Dr.Simonds-Pulmonologist   Dr.Rosenbower-General  Surgeon   [place] Surgeon   Dr.Granfortuna-Hematologist    Dr.Ramaswamy-Pulmonary   Dr.Chris Newman-ENT      reports that he quit smoking about 36 years ago. His smoking use included Cigarettes. He has a 10.5 pack-year smoking history. He has never used smokeless tobacco. He reports that he drinks alcohol. He reports that he does not use illicit drugs.  Family History  Problem Relation Age of Onset  . Bone cancer Mother   . Diabetes Mother   . Colon cancer Neg Hx   . Esophageal cancer Neg Hx   . Rectal cancer Neg Hx   . Stomach cancer Neg Hx    Family Status  Relation Status Death Age  . Mother Deceased   . Father Deceased   . Brother Alive   . Son Alive   . Son Deceased 4    accident    Immunization History  Administered Date(s) Administered  . Influenza Split 10/13/2013  . Influenza-Unspecified 10/02/2014  . Pneumococcal Conjugate-13 10/16/2010, 10/02/2014  . Td 12/15/2007    Allergies  Allergen Reactions  . Heparin     Blood clots   . Aspirin     Breathing complications, able to take three times a week  . Codeine     Some is ok  . Oxycodone     Can not take percocet, palpitations  . Zetia [Ezetimibe]     jittery  . Zocor [Simvastatin]     Liver enzymes increased  . Plavix [Clopidogrel Bisulfate] Palpitations    Medications: Patient's Medications  New Prescriptions   No medications on file  Previous Medications   ALBUTEROL (PROVENTIL HFA;VENTOLIN HFA) 108 (90 BASE) MCG/ACT INHALER    Inhale 2 puffs into the lungs every 6 (six) hours as needed for wheezing.   ALPRAZOLAM (XANAX) 0.5 MG TABLET    Take 0.5 mg by mouth. 1-2 by mouth daily   AMITRIPTYLINE (ELAVIL) 25 MG TABLET    Take 6.25 mg by mouth at bedtime. Takes 1/4 tablet   AMOXICILLIN (AMOXIL) 500 MG TABLET    Take 1 tablet (500 mg total) by mouth 4 (four) times daily.   ASPIRIN 81 MG TABLET    Take 81 mg by mouth daily.   CHOLECALCIFEROL (VITAMIN D) 1000 UNITS TABLET    Take  1,000 Units by mouth daily.   CLARITHROMYCIN (BIAXIN) 500 MG TABLET    Take 1 tablet (500 mg total) by mouth 2 (two) times daily.   CYCLOBENZAPRINE (FLEXERIL)  10 MG TABLET    Take 2.5 mg by mouth 3 (three) times daily as needed for muscle spasms. Takes 1/4 tablet when needed for spasms   DIAZEPAM (VALIUM) 5 MG TABLET    TAKE 1 TABLET BY MOUTH AS NEEDED   DICLOFENAC (VOLTAREN) 75 MG EC TABLET    Take 1 tablet (75 mg total) by mouth every morning.   FLAXSEED, LINSEED, 1000 MG CAPS    Take 1 capsule by mouth daily.   HYOSCYAMINE (LEVBID) 0.375 MG 12 HR TABLET    Take 1 tablet (0.375 mg total) by mouth every morning.   NYSTATIN (MYCOSTATIN) 100000 UNIT/ML SUSPENSION    Take 5 mLs (500,000 Units total) by mouth 4 (four) times daily.   OMEPRAZOLE (PRILOSEC) 40 MG CAPSULE    Take one po bid x 14 days   ROSUVASTATIN (CRESTOR) 10 MG TABLET    Take 5 mg by mouth at bedtime.    SULINDAC (CLINORIL) 200 MG TABLET    Take 200 mg by mouth daily as needed (pain). Usually takes 4 times per week  Modified Medications   No medications on file  Discontinued Medications   MULTIPLE VITAMINS-MINERALS (CENTRUM SILVER PO)    Take 1 capsule by mouth daily.    Review of Systems  Constitutional: Negative for fever, chills, activity change, appetite change, fatigue and unexpected weight change.  HENT: Positive for hearing loss. Negative for congestion and ear pain.   Eyes: Negative.   Respiratory: Negative.  Negative for choking, chest tightness and shortness of breath.   Cardiovascular: Negative.  Negative for chest pain, palpitations and leg swelling.  Gastrointestinal: Negative for nausea and diarrhea.       RUQ discomfort. Had cholecystectomy in Dec 2014.  Endocrine: Negative.   Genitourinary: Negative.   Musculoskeletal: Negative.   Skin: Negative.   Allergic/Immunologic: Negative.   Neurological: Negative.   Hematological: Negative.   Psychiatric/Behavioral: Negative.     Filed Vitals:   10/18/14 1451    BP: 140/70  Pulse: 69  Temp: 97.3 F (36.3 C)  TempSrc: Oral  Height: 5' 8.5" (1.74 m)  Weight: 188 lb (85.276 kg)  SpO2: 99%   Body mass index is 28.17 kg/(m^2).  Physical Exam  Constitutional: He appears well-developed and well-nourished. No distress.  HENT:  Head: Normocephalic and atraumatic.  Right Ear: External ear normal.  Left Ear: External ear normal.  Nose: Nose normal.  Mouth/Throat: Oropharynx is clear and moist.  Dentures. Missing some teeth.  Eyes: Conjunctivae and EOM are normal. Pupils are equal, round, and reactive to light.  Neck: No JVD present. No tracheal deviation present. No thyromegaly present.  Cardiovascular: Normal rate, regular rhythm, normal heart sounds and intact distal pulses.  Exam reveals no gallop and no friction rub.   No murmur heard. Pulmonary/Chest: No respiratory distress. He has no wheezes. He has no rales. He exhibits no tenderness.  Abdominal: He exhibits no distension and no mass. There is no tenderness. There is no rebound and no guarding. No hernia.  post laparoscopic cholecystectomy scars.   Genitourinary:  Enlarged prostate  Musculoskeletal: He exhibits no edema or tenderness.  Neurological: He displays normal reflexes. No cranial nerve deficit. Coordination normal.  Skin: No rash noted. No erythema. No pallor.  Psychiatric: He has a normal mood and affect. His behavior is normal. Judgment and thought content normal.  Nursing note and vitals reviewed.    Labs reviewed: Appointment on 10/17/2014  Component Date Value Ref Range Status  . Cholesterol, Total  10/17/2014 189  100 - 199 mg/dL Final  . Triglycerides 10/17/2014 84  0 - 149 mg/dL Final  . HDL 10/17/2014 57  >39 mg/dL Final   Comment: According to ATP-III Guidelines, HDL-C >59 mg/dL is considered a negative risk factor for CHD.   Marland Kitchen VLDL Cholesterol Cal 10/17/2014 17  5 - 40 mg/dL Final  . LDL Calculated 10/17/2014 115* 0 - 99 mg/dL Final  . Chol/HDL Ratio  10/17/2014 3.3  0.0 - 5.0 ratio units Final   Comment:                                   T. Chol/HDL Ratio                                             Men  Women                               1/2 Avg.Risk  3.4    3.3                                   Avg.Risk  5.0    4.4                                2X Avg.Risk  9.6    7.1                                3X Avg.Risk 23.4   11.0   . Glucose 10/17/2014 98  65 - 99 mg/dL Final  . BUN 10/17/2014 17  8 - 27 mg/dL Final  . Creatinine, Ser 10/17/2014 1.29* 0.76 - 1.27 mg/dL Final  . GFR calc non Af Amer 10/17/2014 53* >59 mL/min/1.73 Final  . GFR calc Af Amer 10/17/2014 62  >59 mL/min/1.73 Final  . BUN/Creatinine Ratio 10/17/2014 13  10 - 22 Final  . Sodium 10/17/2014 140  134 - 144 mmol/L Final  . Potassium 10/17/2014 5.0  3.5 - 5.2 mmol/L Final  . Chloride 10/17/2014 97  97 - 108 mmol/L Final  . CO2 10/17/2014 29  18 - 29 mmol/L Final  . Calcium 10/17/2014 9.5  8.6 - 10.2 mg/dL Final  . Total Protein 10/17/2014 6.8  6.0 - 8.5 g/dL Final  . Albumin 10/17/2014 4.3  3.5 - 4.8 g/dL Final  . Globulin, Total 10/17/2014 2.5  1.5 - 4.5 g/dL Final  . Albumin/Globulin Ratio 10/17/2014 1.7  1.1 - 2.5 Final  . Total Bilirubin 10/17/2014 1.0  0.0 - 1.2 mg/dL Final  . Alkaline Phosphatase 10/17/2014 55  39 - 117 IU/L Final  . AST 10/17/2014 25  0 - 40 IU/L Final  . ALT 10/17/2014 19  0 - 44 IU/L Final  . TSH 10/17/2014 3.340  0.450 - 4.500 uIU/mL Final  . WBC 10/17/2014 7.6  3.4 - 10.8 x10E3/uL Final  . RBC 10/17/2014 5.26  4.14 - 5.80 x10E6/uL Final  . Hemoglobin 10/17/2014 17.0  12.6 - 17.7 g/dL Final  . HCT 10/17/2014 48.3  37.5 - 51.0 % Final  .  MCV 10/17/2014 92  79 - 97 fL Final  . MCH 10/17/2014 32.3  26.6 - 33.0 pg Final  . MCHC 10/17/2014 35.2  31.5 - 35.7 g/dL Final  . RDW 10/17/2014 13.4  12.3 - 15.4 % Final  . Neutrophils Relative % 10/17/2014 63   Final  . Lymphs 10/17/2014 29   Final  . Monocytes 10/17/2014 7   Final  . Eos  10/17/2014 1   Final  . Basos 10/17/2014 0   Final  . Neutrophils Absolute 10/17/2014 4.7  1.4 - 7.0 x10E3/uL Final  . Lymphocytes Absolute 10/17/2014 2.2  0.7 - 3.1 x10E3/uL Final  . Monocytes Absolute 10/17/2014 0.5  0.1 - 0.9 x10E3/uL Final  . Eosinophils Absolute 10/17/2014 0.1  0.0 - 0.4 x10E3/uL Final  . Basophils Absolute 10/17/2014 0.0  0.0 - 0.2 x10E3/uL Final  . Immature Granulocytes 10/17/2014 0   Final  . Immature Grans (Abs) 10/17/2014 0.0  0.0 - 0.1 x10E3/uL Final  Orders Only on 09/13/2014  Component Date Value Ref Range Status  . H. PYLORI Antigen  09/13/2014 POSITIVE   Final  Appointment on 09/12/2014  Component Date Value Ref Range Status  . WBC 09/12/2014 6.9  4.0 - 10.5 K/uL Final  . RBC 09/12/2014 5.42  4.22 - 5.81 Mil/uL Final  . Hemoglobin 09/12/2014 17.3* 13.0 - 17.0 g/dL Final  . HCT 09/12/2014 50.6  39.0 - 52.0 % Final  . MCV 09/12/2014 93.3  78.0 - 100.0 fl Final  . MCHC 09/12/2014 34.3  30.0 - 36.0 g/dL Final  . RDW 09/12/2014 13.0  11.5 - 15.5 % Final  . Platelets 09/12/2014 260.0  150.0 - 400.0 K/uL Final  . Neutrophils Relative % 09/12/2014 70.9  43.0 - 77.0 % Final  . Lymphocytes Relative 09/12/2014 22.0  12.0 - 46.0 % Final  . Monocytes Relative 09/12/2014 6.3  3.0 - 12.0 % Final  . Eosinophils Relative 09/12/2014 0.5  0.0 - 5.0 % Final  . Basophils Relative 09/12/2014 0.3  0.0 - 3.0 % Final  . Neutro Abs 09/12/2014 4.9  1.4 - 7.7 K/uL Final  . Lymphs Abs 09/12/2014 1.5  0.7 - 4.0 K/uL Final  . Monocytes Absolute 09/12/2014 0.4  0.1 - 1.0 K/uL Final  . Eosinophils Absolute 09/12/2014 0.0  0.0 - 0.7 K/uL Final  . Basophils Absolute 09/12/2014 0.0  0.0 - 0.1 K/uL Final  . Sodium 09/12/2014 138  135 - 145 mEq/L Final  . Potassium 09/12/2014 5.0  3.5 - 5.1 mEq/L Final  . Chloride 09/12/2014 101  96 - 112 mEq/L Final  . CO2 09/12/2014 25  19 - 32 mEq/L Final  . Glucose, Bld 09/12/2014 93  70 - 99 mg/dL Final  . BUN 09/12/2014 16  6 - 23 mg/dL Final  .  Creatinine, Ser 09/12/2014 1.2  0.4 - 1.5 mg/dL Final  . Total Bilirubin 09/12/2014 1.7* 0.2 - 1.2 mg/dL Final  . Alkaline Phosphatase 09/12/2014 53  39 - 117 U/L Final  . AST 09/12/2014 27  0 - 37 U/L Final  . ALT 09/12/2014 20  0 - 53 U/L Final  . Total Protein 09/12/2014 7.6  6.0 - 8.3 g/dL Final  . Albumin 09/12/2014 3.8  3.5 - 5.2 g/dL Final  . Calcium 09/12/2014 9.6  8.4 - 10.5 mg/dL Final  . GFR 09/12/2014 65.70  >60.00 mL/min Final  . TSH 09/12/2014 2.20  0.35 - 4.50 uIU/mL Final  . Amylase 09/12/2014 35  27 - 131 U/L Final  .  Lipase 09/12/2014 22.0  11.0 - 59.0 U/L Final  Appointment on 07/31/2014  Component Date Value Ref Range Status  . H. PYLORI Antigen  07/31/2014 POSITIVE   Final    09/13/14 CT abd CLINICAL DATA: Abdominal pain and distention for 1 year. No appetite and recent weight loss.  EXAM: CT ABDOMEN AND PELVIS WITH CONTRAST  TECHNIQUE: Multidetector CT imaging of the abdomen and pelvis was performed using the standard protocol following bolus administration of intravenous contrast.  CONTRAST: 167mL OMNIPAQUE IOHEXOL 300 MG/ML SOLN  COMPARISON: 04/10/2011  FINDINGS: Lower chest: Patchy subpleural atelectasis and probable chronic inflammatory changes with areas of mild peribronchial thickening and possible tree-in-bud appearance. No focal infiltrates or effusions. The heart is upper limits of normal in size for age. No pericardial effusion. Coronary artery calcifications are noted. The distal esophagus is grossly normal.  Hepatobiliary: Diffuse fatty infiltration of the liver but no focal hepatic lesions other than small scattered cysts. The gallbladder is surgically absent. No common bowel duct dilatation.  Pancreas: Normal.  Spleen: Normal.  Adrenals/Urinary Tract: Normal.  Stomach/Bowel: The stomach, duodenum, small bowel and colon are unremarkable. No inflammatory changes, mass lesions or obstructive findings. There is moderate  sigmoid diverticulosis without findings for acute diverticulitis. The terminal ileum is normal. The appendix is normal.  Vascular/Lymphatic: The aorta demonstrates mild tortuosity and moderate atherosclerotic calcifications but no focal aneurysm or dissection. The branch vessels are patent. The major venous structures are patent. No mesenteric or retroperitoneal mass or lymphadenopathy.  Pelvis: The bladder is unremarkable. The prostate gland is mildly enlarged with median lobe hypertrophy impressing on the base of the bladder. The seminal vesicles are normal. No pelvic mass or lymphadenopathy. No inguinal mass or adenopathy. Surgical changes noted from previous anterior abdominal wall hernia repair in the pelvis. No findings for recurrent hernia. No inguinal hernia or inguinal adenopathy.  Musculoskeletal: No significant bony findings. Advanced degenerative changes noted in the lower lumbar spine.  IMPRESSION: No acute abdominal/pelvic findings, mass lesions or lymphadenopathy.  Stable moderate atherosclerotic calcifications involving the aorta and iliac arteries.  Chronic inflammatory changes at the lung bases.  Small scattered low-attenuation hepatic lesions are stable and likely benign cysts.  Nm Gastric Emptying  09/18/2014   CLINICAL DATA:  Bloating R14.0 (ICD-10-CM). Abdominal distention R14.0 (ICD-10-CM).  EXAM: NUCLEAR MEDICINE GASTRIC EMPTYING SCAN  TECHNIQUE: After oral ingestion of radiolabeled meal, sequential abdominal images were obtained for 120 minutes. Residual percentage of activity remaining within the stomach was calculated at 60 and 120 minutes.  RADIOPHARMACEUTICALS:  2.2 mCi Technetium 99-m labeled sulfur colloid  COMPARISON:  None.  FINDINGS: Expected location of the stomach in the left upper quadrant. Ingested meal empties the stomach gradually over the course of the study with 46% retention at 60 min and 7% retention at 120 min (normal retention  less than 30% at a 120 min).  IMPRESSION: Normal gastric emptying study.   Electronically Signed   By: Lorin Picket M.D.   On: 09/18/2014 13:03   Assessment/Plan  1. Toenail fungus - Undecylenic Acid (FUNGI-NAIL) 25 % SOLN; Apply daily to affected toenail  Dispense: 1 Bottle; Refill: 0  2. Helicobacter pylori gastritis Continue meds as directed by Dr. Cristal Generous  3. Hyperlipidemia continue current Cresor 5 mg qd  4. Essential hypertension controlled  5. Coronary artery disease involving native coronary artery of native heart with angina pectoris with documented spasm stable  6. Anxiety state - ALPRAZolam (XANAX) 0.5 MG tablet; 1-2 by mouth daily to help  anxiety  Dispense: 60 tablet; Refill: 5 - diazepam (VALIUM) 5 MG tablet; One daily to help rest  Dispense: 30 tablet; Refill: 5

## 2014-10-18 NOTE — Progress Notes (Signed)
Patient passed clock test.

## 2014-11-08 ENCOUNTER — Telehealth: Payer: Self-pay | Admitting: Internal Medicine

## 2014-11-08 NOTE — Telephone Encounter (Signed)
Spoke with patient and he is feeling better. He has not started the H. Pylori treatment. He has decided to wait until after Christmas to start this because he wants to feel well during the holidays. He wanted Dr. [name] to know.

## 2014-12-12 ENCOUNTER — Telehealth: Payer: Self-pay | Admitting: Internal Medicine

## 2014-12-12 NOTE — Telephone Encounter (Signed)
Spoke with patient and discussed medications and dosing.

## 2014-12-15 ENCOUNTER — Telehealth: Payer: Self-pay | Admitting: Internal Medicine

## 2014-12-18 NOTE — Telephone Encounter (Signed)
Patient notified that eating yogurt is okay.

## 2014-12-25 ENCOUNTER — Telehealth: Payer: Self-pay | Admitting: Internal Medicine

## 2014-12-25 NOTE — Telephone Encounter (Signed)
Left a message for patient to call back. 

## 2014-12-25 NOTE — Telephone Encounter (Signed)
Spoke with patient and he has completed antibiotics. He wants to know if he can take Omeprazole. He has 20 mg tablets. He will continue for a couple more weeks then stop and see how does.

## 2014-12-26 ENCOUNTER — Telehealth: Payer: Self-pay | Admitting: Internal Medicine

## 2014-12-26 NOTE — Telephone Encounter (Signed)
A user error has taken place.

## 2014-12-27 ENCOUNTER — Telehealth: Payer: Self-pay | Admitting: Internal Medicine

## 2014-12-27 DIAGNOSIS — H5213 Myopia, bilateral: Secondary | ICD-10-CM | POA: Diagnosis not present

## 2014-12-27 NOTE — Telephone Encounter (Signed)
Spoke with Mateo Flow at [facility] and patient should be off PPI, antibiotic and bismuth products 2 weeks prior to h. Pylori stool study.

## 2015-01-03 ENCOUNTER — Encounter: Payer: Self-pay | Admitting: Interventional Cardiology

## 2015-01-03 ENCOUNTER — Ambulatory Visit (INDEPENDENT_AMBULATORY_CARE_PROVIDER_SITE_OTHER): Payer: Medicare Other | Admitting: Interventional Cardiology

## 2015-01-03 VITALS — BP 114/70 | HR 70 | Ht 68.5 in | Wt 189.4 lb

## 2015-01-03 DIAGNOSIS — I10 Essential (primary) hypertension: Secondary | ICD-10-CM

## 2015-01-03 DIAGNOSIS — I25701 Atherosclerosis of coronary artery bypass graft(s), unspecified, with angina pectoris with documented spasm: Secondary | ICD-10-CM | POA: Diagnosis not present

## 2015-01-03 DIAGNOSIS — E785 Hyperlipidemia, unspecified: Secondary | ICD-10-CM

## 2015-01-03 MED ORDER — ROSUVASTATIN CALCIUM 10 MG PO TABS: 10.0000 mg | ORAL_TABLET | Freq: Every day | ORAL | Status: AC

## 2015-01-03 NOTE — Patient Instructions (Signed)
Your physician has recommended you make the following change in your medication:  1) INCREASE Crestor to 10 mg daily. An Rx has been sent to your pharmacy  Your physician recommends that you return for a FASTING lipid profile and alt in 6-8 weeks  Your physician wants you to follow-up in: 6 months with Dr.Smith You will receive a reminder letter in the mail two months in advance. If you don't receive a letter, please call our office to schedule the follow-up appointment.

## 2015-01-03 NOTE — Progress Notes (Signed)
Patient ID: Alexander [name], male   DOB: 1936/01/09, 79 y.o.   MRN: [id number]    Cardiology Office Note   Date:  01/03/2015   ID:  Alexander [name], DOB 1936-05-10, MRN [id number]  PCP:  Estill Dooms, MD  Cardiologist:   Sinclair Grooms, MD   No chief complaint on file.  Follow-up coronary artery disease   History of Present Illness: Alexander [name] is a 79 y.o. male who presents for follow-up of CAD/CABG and cardiovascular risk factors. He denies angina. He has had prior coronary bypass grafting approximate 3 years ago. He exercises on a regular basis. Most recent lipid panel by Dr. Nyoka Cowden and a straight and LDL of 115. He denies orthopnea, PND, and claudication. No medication side effects.    Past Medical History  Diagnosis Date  . Pneumonia   . Pulmonary embolism 2012    s/p heart surgery  . Hyperlipidemia   . Dysuria   . Clotting disorder   . Difficulty urinating   . Rheumatic fever age 89  . History of anal fissures   . Anxiety   . Diverticulosis of colon (without mention of hemorrhage)     mild  . Internal hemorrhoids without mention of complication   . Allergic rhinitis due to pollen   . History of cholelithiasis   . Edema   . Nonspecific elevation of levels of transaminase or lactic acid dehydrogenase (LDH)   . Long term (current) use of anticoagulants   . Other voice and resonance disorders     after surgery, took breathing tube out and damaged something  . Coronary atherosclerosis of native coronary artery     with CABG 3/12  -MAZE procedure   . Hypertrophy of prostate with urinary obstruction and other lower urinary tract symptoms (LUTS)   . Impotence of organic origin   . Unspecified constipation   . Atrial fibrillation     hx of  . Heart attack 2012  . Anginal pain     once in a while, none recent  . Sleep apnea     no CPAP  . Asthma     exercise induced  . Skin cancer     Hx of squamous cell x2  . Basal cell carcinoma of skin of lip   .  Jaundice age 45  . Essential hypertension, benign   . Pulmonary embolism     3/12  . 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 Anxiety disorder   . Helicobacter pylori (H. pylori)     Past Surgical History  Procedure Laterality Date  . Hernia repair  2010    bilateral  . Coronary artery bypass graft  Febuary 13, 2012    full maze   . Scrotum exploration  1990's    multiple  . Mohs surgery      squamus x2  . Vasectomy  04/1992  . Superficial skin cystectomy (l) hip Right 1997    dr [name]  . Hydrocele excision      dr [name]   . Spermatocelectomy  02/1999    Right, Dr.[name]  . Cholecystectomy N/A 11/18/2013    Procedure: LAPAROSCOPIC CHOLECYSTECTOMY With IOC;  Surgeon: Odis Hollingshead, MD;  Location: [facility];  Service: General;  Laterality: N/A;     Current Outpatient Prescriptions  Medication Sig Dispense Refill  . albuterol (PROVENTIL HFA;VENTOLIN HFA) 108 (90 BASE) MCG/ACT inhaler Inhale 2 puffs into the lungs every 6 (six) hours as needed for  wheezing. 1 Inhaler 0  . ALPRAZolam (XANAX) 0.5 MG tablet 1-2 by mouth daily to help anxiety 60 tablet 5  . amitriptyline (ELAVIL) 25 MG tablet Take 6.25 mg by mouth at bedtime. Takes 1/4 tablet    . aspirin 81 MG tablet Take 81 mg by mouth 3 (three) times a week. Mondays, wednesdays and fridays    . cholecalciferol (VITAMIN D) 1000 UNITS tablet Take 1,000 Units by mouth daily.    . cyclobenzaprine (FLEXERIL) 10 MG tablet Take 2.5 mg by mouth 3 (three) times daily as needed for muscle spasms. Takes 1/4 tablet when needed for spasms    . diazepam (VALIUM) 5 MG tablet One daily to help rest (Patient taking differently: Take 5 mg by mouth daily. One daily to help rest) 30 tablet 5  . rosuvastatin (CRESTOR) 10 MG tablet Take 1 tablet (10 mg total) by mouth daily. 30 tablet 11  . Undecylenic Acid (FUNGI-NAIL) 25 % SOLN Apply daily to affected toenail 1 Bottle 0  . diclofenac (VOLTAREN) 75 MG EC tablet Take 1 tablet (75 mg total) by  mouth every morning. (Patient not taking: Reported on 01/03/2015) 30 tablet 1  . Flaxseed, Linseed, 1000 MG CAPS Take 1 capsule by mouth daily.     No current facility-administered medications for this visit.    Allergies:   Heparin; Aspirin; Codeine; Oxycodone; Zetia; Zocor; and Plavix    Social History:  The patient  reports that he quit smoking about 37 years ago. His smoking use included Cigarettes. He has a 10.5 pack-year smoking history. He has never used smokeless tobacco. He reports that he drinks alcohol. He reports that he does not use illicit drugs.   Family History:  The patient's family history includes Bone cancer in his mother; Diabetes in his mother. There is no history of Colon cancer, Esophageal cancer, Rectal cancer, or Stomach cancer.    ROS:  Please see the history of present illness.   All other systems are reviewed and negative.    PHYSICAL EXAM: VS:  BP 114/70 mmHg  Pulse 70  Ht 5' 8.5" (1.74 m)  Wt 189 lb 6.4 oz (85.911 kg)  BMI 28.38 kg/m2 , BMI Body mass index is 28.38 kg/(m^2). GEN: Well nourished, well developed, in no acute distress HEENT: normal Neck: no JVD, carotid bruits, or masses Cardiac: RRR; no murmurs, rubs, or gallops,no edema  Respiratory:  clear to auscultation bilaterally, normal work of breathing GI: soft, nontender, nondistended, + BS MS: no deformity or atrophy Skin: warm and dry, no rash Neuro:  Strength and sensation are intact Psych: euthymic mood, full affect   EKG:  EKG is ordered today. The ekg ordered today demonstrates normal sinus rhythm with a normal tracing. No change from prior.   Recent Labs: 09/12/2014: Platelets 260.0 10/17/2014: ALT 19; BUN 17; Creatinine 1.29*; Hemoglobin 17.0; Potassium 5.0; Sodium 140; TSH 3.340    Lipid Panel    Component Value Date/Time   CHOL * 01/17/2011 0320    228        ATP III CLASSIFICATION:  <200     mg/dL   Desirable  200-239  mg/dL   Borderline High  >=240    mg/dL   High           TRIG 84 10/17/2014 1014   HDL 57 10/17/2014 1014   HDL 49 01/17/2011 0320   CHOLHDL 3.3 10/17/2014 1014   CHOLHDL 4.7 01/17/2011 0320   VLDL 8 01/17/2011 0320   LDLCALC 115*  10/17/2014 1014   LDLCALC * 01/17/2011 0320    171        Total Cholesterol/HDL:CHD Risk Coronary Heart Disease Risk Table                     Men   Women  1/2 Average Risk   3.4   3.3  Average Risk       5.0   4.4  2 X Average Risk   9.6   7.1  3 X Average Risk  23.4   11.0        Use the calculated Patient Ratio above and the CHD Risk Table to determine the patient's CHD Risk.        ATP III CLASSIFICATION (LDL):  <100     mg/dL   Optimal  100-129  mg/dL   Near or Above                    Optimal  130-159  mg/dL   Borderline  160-189  mg/dL   High  >190     mg/dL   Very High      Wt Readings from Last 3 Encounters:  01/03/15 189 lb 6.4 oz (85.911 kg)  10/18/14 188 lb (85.276 kg)  10/17/14 188 lb 9.6 oz (85.548 kg)      Other studies Reviewed: Additional studies/ records that were reviewed today include: Prior lipid panel. Review of the above records demonstrates: Suboptimal lipid control   ASSESSMENT AND PLAN:  1.  Coronary artery disease with prior bypass grafting. Patient is totally asymptomatic and continues to lead an active lifestyle. 2. Hypertension under excellent control 3. Hyperlipidemia with suboptimal LDL of 115 done in November 2015. We will increase Crestor to 10 mg per day   Current medicines are reviewed at length with the patient today.  The patient does not have concerns regarding medicines.  The following changes have been made:  Increase Crestor to 10 mg per day. A lipid panel will be performed in 6-8 weeks.  Labs/ tests ordered today include:   Orders Placed This Encounter  Procedures  . Lipid panel  . ALT  . EKG 12-Lead     Disposition:   FU with Tamala Julian M.D. in [address], Sinclair Grooms, MD  01/03/2015 8:50 AM    [facility] [place], [place], [place]  [address] Phone: [phone]; Fax: [phone]

## 2015-01-17 ENCOUNTER — Telehealth: Payer: Self-pay | Admitting: Internal Medicine

## 2015-01-17 NOTE — Telephone Encounter (Signed)
Spoke with patient and told him the h. Pylori test is in. He is not having any problems with diarrhea. He just thought he might need c.diff test. Explained why that test is performed and he does not have any symptoms for this.

## 2015-01-23 ENCOUNTER — Other Ambulatory Visit: Payer: Medicare Other

## 2015-01-23 ENCOUNTER — Emergency Department (HOSPITAL_COMMUNITY)
Admission: EM | Admit: 2015-01-23 | Discharge: 2015-01-23 | Disposition: A | Discharge status: Home / Self Care | Payer: Medicare Other | Attending: Emergency Medicine | Admitting: Emergency Medicine

## 2015-01-23 ENCOUNTER — Encounter (HOSPITAL_COMMUNITY): Payer: Self-pay

## 2015-01-23 DIAGNOSIS — Z85828 Personal history of other malignant neoplasm of skin: Secondary | ICD-10-CM | POA: Diagnosis not present

## 2015-01-23 DIAGNOSIS — Z79899 Other long term (current) drug therapy: Secondary | ICD-10-CM | POA: Insufficient documentation

## 2015-01-23 DIAGNOSIS — Z8669 Personal history of other diseases of the nervous system and sense organs: Secondary | ICD-10-CM | POA: Diagnosis not present

## 2015-01-23 DIAGNOSIS — I4891 Unspecified atrial fibrillation: Secondary | ICD-10-CM | POA: Insufficient documentation

## 2015-01-23 DIAGNOSIS — L03115 Cellulitis of right lower limb: Secondary | ICD-10-CM

## 2015-01-23 DIAGNOSIS — Z87891 Personal history of nicotine dependence: Secondary | ICD-10-CM | POA: Insufficient documentation

## 2015-01-23 DIAGNOSIS — M7989 Other specified soft tissue disorders: Secondary | ICD-10-CM

## 2015-01-23 DIAGNOSIS — J45909 Unspecified asthma, uncomplicated: Secondary | ICD-10-CM | POA: Insufficient documentation

## 2015-01-23 DIAGNOSIS — Z86711 Personal history of pulmonary embolism: Secondary | ICD-10-CM | POA: Diagnosis not present

## 2015-01-23 DIAGNOSIS — Z951 Presence of aortocoronary bypass graft: Secondary | ICD-10-CM | POA: Diagnosis not present

## 2015-01-23 DIAGNOSIS — Z862 Personal history of diseases of the blood and blood-forming organs and certain disorders involving the immune mechanism: Secondary | ICD-10-CM | POA: Insufficient documentation

## 2015-01-23 DIAGNOSIS — Z8719 Personal history of other diseases of the digestive system: Secondary | ICD-10-CM | POA: Diagnosis not present

## 2015-01-23 DIAGNOSIS — Z87448 Personal history of other diseases of urinary system: Secondary | ICD-10-CM | POA: Insufficient documentation

## 2015-01-23 DIAGNOSIS — E785 Hyperlipidemia, unspecified: Secondary | ICD-10-CM | POA: Diagnosis not present

## 2015-01-23 DIAGNOSIS — R9431 Abnormal electrocardiogram [ECG] [EKG]: Secondary | ICD-10-CM | POA: Diagnosis not present

## 2015-01-23 DIAGNOSIS — Z7901 Long term (current) use of anticoagulants: Secondary | ICD-10-CM | POA: Diagnosis not present

## 2015-01-23 DIAGNOSIS — Z8701 Personal history of pneumonia (recurrent): Secondary | ICD-10-CM | POA: Insufficient documentation

## 2015-01-23 DIAGNOSIS — I252 Old myocardial infarction: Secondary | ICD-10-CM | POA: Insufficient documentation

## 2015-01-23 DIAGNOSIS — I10 Essential (primary) hypertension: Secondary | ICD-10-CM | POA: Diagnosis not present

## 2015-01-23 DIAGNOSIS — A048 Other specified bacterial intestinal infections: Secondary | ICD-10-CM

## 2015-01-23 DIAGNOSIS — F419 Anxiety disorder, unspecified: Secondary | ICD-10-CM | POA: Diagnosis not present

## 2015-01-23 DIAGNOSIS — I2510 Atherosclerotic heart disease of native coronary artery without angina pectoris: Secondary | ICD-10-CM | POA: Diagnosis not present

## 2015-01-23 DIAGNOSIS — Z7982 Long term (current) use of aspirin: Secondary | ICD-10-CM | POA: Diagnosis not present

## 2015-01-23 DIAGNOSIS — M79604 Pain in right leg: Secondary | ICD-10-CM

## 2015-01-23 DIAGNOSIS — Z8619 Personal history of other infectious and parasitic diseases: Secondary | ICD-10-CM | POA: Insufficient documentation

## 2015-01-23 DIAGNOSIS — B9681 Helicobacter pylori [H. pylori] as the cause of diseases classified elsewhere: Secondary | ICD-10-CM | POA: Diagnosis not present

## 2015-01-23 LAB — CBC WITH DIFFERENTIAL/PLATELET
Basophils Absolute: 0 10*3/uL (ref 0.0–0.1)
Basophils Relative: 0 % (ref 0–1)
EOS ABS: 0 10*3/uL (ref 0.0–0.7)
Eosinophils Relative: 0 % (ref 0–5)
HEMATOCRIT: 47.2 % (ref 39.0–52.0)
Hemoglobin: 16 g/dL (ref 13.0–17.0)
Lymphocytes Relative: 18 % (ref 12–46)
Lymphs Abs: 1.3 10*3/uL (ref 0.7–4.0)
MCH: 31.8 pg (ref 26.0–34.0)
MCHC: 33.9 g/dL (ref 30.0–36.0)
MCV: 93.8 fL (ref 78.0–100.0)
Monocytes Absolute: 0.7 10*3/uL (ref 0.1–1.0)
Monocytes Relative: 10 % (ref 3–12)
NEUTROS ABS: 5.1 10*3/uL (ref 1.7–7.7)
NEUTROS PCT: 72 % (ref 43–77)
PLATELETS: 224 10*3/uL (ref 150–400)
RBC: 5.03 MIL/uL (ref 4.22–5.81)
RDW: 12.6 % (ref 11.5–15.5)
WBC: 7.1 10*3/uL (ref 4.0–10.5)

## 2015-01-23 LAB — BASIC METABOLIC PANEL
ANION GAP: 7 (ref 5–15)
BUN: 23 mg/dL (ref 6–23)
CO2: 27 mmol/L (ref 19–32)
CREATININE: 0.95 mg/dL (ref 0.50–1.35)
Calcium: 9.2 mg/dL (ref 8.4–10.5)
Chloride: 104 mmol/L (ref 96–112)
GFR, EST NON AFRICAN AMERICAN: 79 mL/min — AB (ref >90)
Glucose, Bld: 110 mg/dL — ABNORMAL HIGH (ref 70–99)
Potassium: 4.5 mmol/L (ref 3.5–5.1)
SODIUM: 138 mmol/L (ref 135–145)

## 2015-01-23 LAB — URINALYSIS, ROUTINE W REFLEX MICROSCOPIC
Bilirubin Urine: NEGATIVE
Glucose, UA: NEGATIVE mg/dL
KETONES UR: NEGATIVE mg/dL
LEUKOCYTES UA: NEGATIVE
Nitrite: NEGATIVE
PROTEIN: NEGATIVE mg/dL
Specific Gravity, Urine: 1.009 (ref 1.005–1.030)
UROBILINOGEN UA: 0.2 mg/dL (ref 0.0–1.0)
pH: 5.5 (ref 5.0–8.0)

## 2015-01-23 LAB — URINE MICROSCOPIC-ADD ON

## 2015-01-23 MED ORDER — CLINDAMYCIN HCL 300 MG PO CAPS: 300.0000 mg | ORAL_CAPSULE | Freq: Four times a day (QID) | ORAL | Status: DC

## 2015-01-23 NOTE — ED Notes (Signed)
MD at bedside. EDP GENTRY PRESENT

## 2015-01-23 NOTE — ED Notes (Signed)
Vascular at bedside, will draw after they are fininshed.

## 2015-01-23 NOTE — ED Notes (Signed)
Pt with leg pain starting yesterday.  Swollen and hot.  Worried for blood clot.

## 2015-01-23 NOTE — Progress Notes (Signed)
Right lower extremity arterial and venous duplex completed.  Right:  No evidence of DVT, superficial thrombosis, or Baker's cyst.  Arterial flow is triphasic throughout without evidence of significant stenosis.  Left:  Negative for DVT in the common femoral vein.  No evidence of significant stenosis and triphasic waveforms noted in the left common femoral artery.

## 2015-01-23 NOTE — ED Notes (Signed)
Pt able to ambulate to the bathroom independently. Pt stated that it hurts to walk on his right foot.

## 2015-01-23 NOTE — Discharge Instructions (Signed)

## 2015-01-23 NOTE — ED Provider Notes (Signed)
CSN: [id number]     Arrival date & time 01/23/15  1213 History   First MD Initiated Contact with Patient 01/23/15 1245     Chief Complaint  Patient presents with  . Leg Pain     (Consider location/radiation/quality/duration/timing/severity/associated sxs/prior Treatment) Patient is a 79 y.o. male presenting with leg pain.  Leg Pain Location:  Leg Time since incident:  1 day Injury: no   Leg location:  R leg Pain details:    Quality:  Aching   Radiates to:  Does not radiate   Severity:  Moderate   Onset quality:  Sudden   Duration:  1 day   Timing:  Intermittent   Progression:  Waxing and waning Chronicity:  New Dislocation: no   Relieved by:  Nothing Worsened by:  Bearing weight Associated symptoms: swelling (intermittently with erythema)   Associated symptoms: no back pain, no fever, no neck pain and no numbness     Past Medical History  Diagnosis Date  . Pneumonia   . Pulmonary embolism 2012    s/p heart surgery  . Hyperlipidemia   . Dysuria   . Clotting disorder   . Difficulty urinating   . Rheumatic fever age 67  . History of anal fissures   . Anxiety   . Diverticulosis of colon (without mention of hemorrhage)     mild  . Internal hemorrhoids without mention of complication   . Allergic rhinitis due to pollen   . History of cholelithiasis   . Edema   . Nonspecific elevation of levels of transaminase or lactic acid dehydrogenase (LDH)   . Long term (current) use of anticoagulants   . Other voice and resonance disorders     after surgery, took breathing tube out and damaged something  . Coronary atherosclerosis of native coronary artery     with CABG 3/12  -MAZE procedure   . Hypertrophy of prostate with urinary obstruction and other lower urinary tract symptoms (LUTS)   . Impotence of organic origin   . Unspecified constipation   . Atrial fibrillation     hx of  . Heart attack 2012  . Anginal pain     once in a while, none recent  . Sleep apnea    no CPAP  . Asthma     exercise induced  . Skin cancer     Hx of squamous cell x2  . Basal cell carcinoma of skin of lip   . Jaundice age 55  . Essential hypertension, benign   . Pulmonary embolism     3/12  . 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 Anxiety disorder   . Helicobacter pylori (H. pylori)    Past Surgical History  Procedure Laterality Date  . Hernia repair  2010    bilateral  . Coronary artery bypass graft  Febuary 13, 2012    full maze   . Scrotum exploration  1990's    multiple  . Mohs surgery      squamus x2  . Vasectomy  04/1992  . Superficial skin cystectomy (l) hip Right 1997    dr [name]  . Hydrocele excision      dr [name]   . Spermatocelectomy  02/1999    Right, Dr.[name]  . Cholecystectomy N/A 11/18/2013    Procedure: LAPAROSCOPIC CHOLECYSTECTOMY With IOC;  Surgeon: Odis Hollingshead, MD;  Location: [facility];  Service: General;  Laterality: N/A;   Family History  Problem Relation Age of Onset  .  Bone cancer Mother   . Diabetes Mother   . Colon cancer Neg Hx   . Esophageal cancer Neg Hx   . Rectal cancer Neg Hx   . Stomach cancer Neg Hx    History  Substance Use Topics  . Smoking status: Former Smoker -- 0.50 packs/day for 21 years    Types: Cigarettes    Quit date: 12/08/1977  . Smokeless tobacco: Never Used  . Alcohol Use: Yes     Comment: former heavy alcohol use, rare use    Review of Systems  Constitutional: Negative for fever.  Musculoskeletal: Negative for back pain and neck pain.  All other systems reviewed and are negative.     Allergies  Heparin; Aspirin; Codeine; Oxycodone; Zetia; Zocor; and Plavix  Home Medications   Prior to Admission medications   Medication Sig Start Date End Date Taking? Authorizing Provider  albuterol (PROVENTIL HFA;VENTOLIN HFA) 108 (90 BASE) MCG/ACT inhaler Inhale 2 puffs into the lungs every 6 (six) hours as needed for wheezing. 04/13/13  Yes Estill Dooms, MD  ALPRAZolam Duanne Moron) 0.5 MG  tablet 1-2 by mouth daily to help anxiety Patient taking differently: Take 0.25 mg by mouth at bedtime as needed for sleep. 1-2 by mouth daily to help anxiety 10/18/14  Yes Estill Dooms, MD  amitriptyline (ELAVIL) 25 MG tablet Take 6.25 mg by mouth at bedtime. Takes 1/4 tablet   Yes Historical Provider, MD  aspirin 81 MG tablet Take 81 mg by mouth 3 (three) times a week. Mondays, wednesdays and fridays   Yes Historical Provider, MD  cholecalciferol (VITAMIN D) 1000 UNITS tablet Take 1,000 Units by mouth daily.   Yes Historical Provider, MD  cyclobenzaprine (FLEXERIL) 10 MG tablet Take 2.5 mg by mouth 3 (three) times daily as needed for muscle spasms. Takes 1/4 tablet when needed for spasms   Yes Historical Provider, MD  diazepam (VALIUM) 5 MG tablet One daily to help rest Patient taking differently: Take 5 mg by mouth daily. One daily to help rest 10/18/14  Yes Estill Dooms, MD  diclofenac (VOLTAREN) 75 MG EC tablet Take 1 tablet (75 mg total) by mouth every morning. Patient taking differently: Take 75 mg by mouth daily as needed for mild pain.  05/24/14  Yes Lafayette Dragon, MD  Flaxseed, Linseed, 1000 MG CAPS Take 1 capsule by mouth daily.   Yes Historical Provider, MD  rosuvastatin (CRESTOR) 10 MG tablet Take 1 tablet (10 mg total) by mouth daily. 01/03/15  Yes Belva Crome III, MD  Undecylenic Acid (FUNGI-NAIL) 25 % SOLN Apply daily to affected toenail 10/18/14  Yes Estill Dooms, MD  clindamycin (CLEOCIN) 300 MG capsule Take 1 capsule (300 mg total) by mouth 4 (four) times daily. X 7 days 01/23/15   Debby Freiberg, MD   BP 118/67 mmHg  Pulse 77  Temp(Src) 98 F (36.7 C) (Oral)  Resp 18  SpO2 96% Physical Exam  Constitutional: He is oriented to person, place, and time. He appears well-developed and well-nourished.  HENT:  Head: Normocephalic and atraumatic.  Eyes: Conjunctivae and EOM are normal.  Neck: Normal range of motion. Neck supple.  Cardiovascular: Normal rate, regular rhythm  and normal heart sounds.   Pulmonary/Chest: Effort normal and breath sounds normal. No respiratory distress.  Abdominal: He exhibits no distension. There is no tenderness. There is no rebound and no guarding.  Musculoskeletal: Normal range of motion.       Right ankle: He exhibits swelling (with well  circumscribed erythema) and abnormal pulse (1+ DP, 2+ PT).       Left ankle: Normal. He exhibits normal pulse.  Neurological: He is alert and oriented to person, place, and time.  Skin: Skin is warm and dry.  Vitals reviewed.   ED Course  Procedures (including critical care time) Labs Review Labs Reviewed  BASIC METABOLIC PANEL - Abnormal; Notable for the following:    Glucose, Bld 110 (*)    GFR calc non Af Amer 79 (*)    All other components within normal limits  URINALYSIS, ROUTINE W REFLEX MICROSCOPIC - Abnormal; Notable for the following:    Hgb urine dipstick TRACE (*)    All other components within normal limits  CBC WITH DIFFERENTIAL/PLATELET  URINE MICROSCOPIC-ADD ON    Imaging Review No results found.   EKG Interpretation   Date/Time:  Tuesday January 23 2015 14:55:58 EST Ventricular Rate:  66 PR Interval:  212 QRS Duration: 78 QT Interval:  378 QTC Calculation: 396 R Axis:   48 Text Interpretation:  Sinus rhythm Borderline prolonged PR interval Low  voltage, extremity leads No significant change since last tracing  Confirmed by Debby Freiberg [phone]) on 01/23/2015 3:20:29 PM      MDM   Final diagnoses:  Cellulitis of right lower extremity    79 y.o. male with pertinent PMH of HTN, prior PE after CAD, prior afib  presents with R leg pain, erythema.  On arrival vitals and physical exam as above.  Palpable, but decreased pulses on R, with temperature difference, very mild.  [place] art/vein obtained and unremarkable.  Likely Cellulitis, however discussed possibility of vasculitis or other pathology at length, pt to fu with PCP.  No abscess appreciable on exam.  DC  home in stable condition.    I have reviewed all laboratory and imaging studies if ordered as above  1. Cellulitis of right lower extremity         Debby Freiberg, MD 01/24/15 1002

## 2015-01-25 ENCOUNTER — Encounter: Payer: Self-pay | Admitting: Internal Medicine

## 2015-01-25 ENCOUNTER — Telehealth: Payer: Self-pay | Admitting: *Deleted

## 2015-01-25 ENCOUNTER — Ambulatory Visit (INDEPENDENT_AMBULATORY_CARE_PROVIDER_SITE_OTHER): Payer: Medicare Other | Admitting: Internal Medicine

## 2015-01-25 VITALS — BP 124/70 | HR 70 | Temp 98.0°F | Wt 187.0 lb

## 2015-01-25 DIAGNOSIS — S99911A Unspecified injury of right ankle, initial encounter: Secondary | ICD-10-CM

## 2015-01-25 LAB — HELICOBACTER PYLORI  SPECIAL ANTIGEN: H. PYLORI ANTIGEN STOOL: NEGATIVE

## 2015-01-25 NOTE — Telephone Encounter (Signed)
Patient asking for Omeprazole 20 mg rx. He states this helps with reflux. Is this ok?

## 2015-01-25 NOTE — Progress Notes (Signed)
Patient ID: Alexander [name], male   DOB: Jan 06, 1936, 79 y.o.   MRN: [id number]   Location:  [facility]   Allergies  Allergen Reactions  . Heparin     Blood clots   . Aspirin     Breathing complications, able to take three times a week  . Codeine     Some is ok  . Oxycodone     Can not take percocet, palpitations  . Zetia [Ezetimibe]     jittery  . Zocor [Simvastatin]     Liver enzymes increased  . Plavix [Clopidogrel Bisulfate] Palpitations    Chief Complaint  Patient presents with  . Acute Visit    Right foot swelling, seen in ER on Tuesday     HPI: Patient is a 79 y.o. white male seen in the office today for acute visit and ER f/u for right foot swelling after he cleaned off the snow off his car with his sneakers on.  He kicked off his sneakers and laid back down for a few hours.  Got up and went downstairs and suddenly his foot started hurting.  Began right on dorsum of foot, about 1 cm swollen area.  Was painful.  Next morning took trash out and went back in, sat down on couch and looked at foot.  Was then purple, raised and inflamed on right side of dorsum of foot.  Called his wife at work b/c it was getting tight and he had trouble walking on the foot.  Went to ED.  Spread more by the time physician saw him.    2/16, pt with leg pain that started 2/15--went to ED with swollen and hot leg.  He was thought to have decreased pulses on the right and mild temp difference.  He was diagnosed with probable cellulitis of his right leg.  Vasculitis was also a consideration.  He had a right lower extremity arterial and venous duplex done of both legs.  Both were entirely negative. He was treated with clindamycin for cellulitis.  Kidneys and urine also checked and normal.    Swelling was mostly going down at night.  Toes never got blood under skin.  Pain gradually has gotten better.  Was on his styrofoam roller and laid back to stretch his back.   Rotated his ankles 10 times each way (this was on 2/13).    Review of Systems:  Review of Systems  Constitutional: Negative for fever and chills.  Respiratory: Negative for shortness of breath.   Cardiovascular: Positive for leg swelling. Negative for chest pain.  Musculoskeletal: Negative for falls.  Skin: Negative for itching and rash.       Swelling of right foot     Past Medical History  Diagnosis Date  . Pneumonia   . Pulmonary embolism 2012    s/p heart surgery  . Hyperlipidemia   . Dysuria   . Clotting disorder   . Difficulty urinating   . Rheumatic fever age 20  . History of anal fissures   . Anxiety   . Diverticulosis of colon (without mention of hemorrhage)     mild  . Internal hemorrhoids without mention of complication   . Allergic rhinitis due to pollen   . History of cholelithiasis   . Edema   . Nonspecific elevation of levels of transaminase or lactic acid dehydrogenase (LDH)   . Long term (current) use of anticoagulants   . Other voice and resonance disorders  after surgery, took breathing tube out and damaged something  . Coronary atherosclerosis of native coronary artery     with CABG 3/12  -MAZE procedure   . Hypertrophy of prostate with urinary obstruction and other lower urinary tract symptoms (LUTS)   . Impotence of organic origin   . Unspecified constipation   . Atrial fibrillation     hx of  . Heart attack 2012  . Anginal pain     once in a while, none recent  . Sleep apnea     no CPAP  . Asthma     exercise induced  . Skin cancer     Hx of squamous cell x2  . Basal cell carcinoma of skin of lip   . Jaundice age 49  . Essential hypertension, benign   . Pulmonary embolism     3/12  . 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 Anxiety disorder   . Helicobacter pylori (H. pylori)     Past Surgical History  Procedure Laterality Date  . Hernia repair  2010    bilateral  . Coronary artery bypass graft  Febuary 13, 2012    full  maze   . Scrotum exploration  1990's    multiple  . Mohs surgery      squamus x2  . Vasectomy  04/1992  . Superficial skin cystectomy (l) hip Right 1997    dr [name]  . Hydrocele excision      dr [name]   . Spermatocelectomy  02/1999    Right, Dr.[name]  . Cholecystectomy N/A 11/18/2013    Procedure: LAPAROSCOPIC CHOLECYSTECTOMY With IOC;  Surgeon: Odis Hollingshead, MD;  Location: [facility];  Service: General;  Laterality: N/A;    Social History:   reports that he quit smoking about 37 years ago. His smoking use included Cigarettes. He has a 10.5 pack-year smoking history. He has never used smokeless tobacco. He reports that he drinks alcohol. He reports that he does not use illicit drugs.  Family History  Problem Relation Age of Onset  . Bone cancer Mother   . Diabetes Mother   . Colon cancer Neg Hx   . Esophageal cancer Neg Hx   . Rectal cancer Neg Hx   . Stomach cancer Neg Hx     Medications: Patient's Medications  New Prescriptions   No medications on file  Previous Medications   ALBUTEROL (PROVENTIL HFA;VENTOLIN HFA) 108 (90 BASE) MCG/ACT INHALER    Inhale 2 puffs into the lungs every 6 (six) hours as needed for wheezing.   ALPRAZOLAM (XANAX) 0.5 MG TABLET    1-2 by mouth daily to help anxiety   AMITRIPTYLINE (ELAVIL) 25 MG TABLET    Take 6.25 mg by mouth at bedtime. Takes 1/4 tablet   ASPIRIN 81 MG TABLET    Take 81 mg by mouth 3 (three) times a week. Mondays, wednesdays and fridays   CHOLECALCIFEROL (VITAMIN D) 1000 UNITS TABLET    Take 1,000 Units by mouth daily.   CLINDAMYCIN (CLEOCIN) 300 MG CAPSULE    Take 1 capsule (300 mg total) by mouth 4 (four) times daily. X 7 days   CYCLOBENZAPRINE (FLEXERIL) 10 MG TABLET    Take 2.5 mg by mouth 3 (three) times daily as needed for muscle spasms. Takes 1/4 tablet when needed for spasms   DIAZEPAM (VALIUM) 5 MG TABLET    One daily to help rest   DICLOFENAC (VOLTAREN) 75 MG EC TABLET    Take  1 tablet (75 mg total) by mouth every  morning.   FLAXSEED, LINSEED, 1000 MG CAPS    Take 1 capsule by mouth daily.   ROSUVASTATIN (CRESTOR) 10 MG TABLET    Take 1 tablet (10 mg total) by mouth daily.   UNDECYLENIC ACID (FUNGI-NAIL) 25 % SOLN    Apply daily to affected toenail  Modified Medications   No medications on file  Discontinued Medications   No medications on file     Physical Exam: Filed Vitals:   01/25/15 1604  BP: 124/70  Pulse: 70  Temp: 98 F (36.7 C)  TempSrc: Oral  Weight: 187 lb (84.823 kg)  SpO2: 97%  Physical Exam  Musculoskeletal: Normal range of motion. He exhibits no tenderness.  Right lateral foot dorsum with mild ecchymoses still present and minimal swelling, but no tenderness, full ROM    Labs reviewed: Basic Metabolic Panel:  Recent Labs  09/12/14 1118 10/17/14 1014 01/23/15 1443  NA 138 140 138  K 5.0 5.0 4.5  CL 101 97 104  CO2 25 29 27   GLUCOSE 93 98 110*  BUN 16 17 23   CREATININE 1.2 1.29* 0.95  CALCIUM 9.6 9.5 9.2  TSH 2.20 3.340  --    Liver Function Tests:  Recent Labs  04/20/14 0340 09/12/14 1118 10/17/14 1014  AST 22 27 25   ALT 17 20 19   ALKPHOS 52 53 55  BILITOT 1.0 1.7* 1.0  PROT 6.6 7.6 6.8  ALBUMIN 4.0 3.8  --     Recent Labs  09/12/14 1118  LIPASE 22.0  AMYLASE 35   No results for input(s): AMMONIA in the last 8760 hours. CBC:  Recent Labs  04/11/14 1609 09/12/14 1118 10/17/14 1014 01/23/15 1443  WBC 7.0 6.9 7.6 7.1  NEUTROABS 4.6 4.9 4.7 5.1  HGB 16.5 17.3* 17.0 16.0  HCT 47.9 50.6 48.3 47.2  MCV 91 93.3 92 93.8  PLT 247 260.0  --  224   Lipid Panel:  Recent Labs  10/17/14 1014  HDL 57  LDLCALC 115*  TRIG 84  CHOLHDL 3.3   Lab Results  Component Value Date   HGBA1C * 01/17/2011    6.0 (NOTE)                                                                       According to the ADA Clinical Practice Recommendations for 2011, when HbA1c is used as a screening test:   >=6.5%   Diagnostic of Diabetes Mellitus           (if  abnormal result  is confirmed)  5.7-6.4%   Increased risk of developing Diabetes Mellitus  References:Diagnosis and Classification of Diabetes Mellitus,Diabetes YKZL,9357,01(XBLTJ 1):S62-S69 and Standards of Medical Care in         Diabetes - 2011,Diabetes Care,2011,34  (Suppl 1):S11-S61.    Past Procedures: Arterial and venous duplex reviewed.  Assessment/Plan 1. Right ankle injury, initial encounter -I suspect he injured his ankle doing his rotation exercises and may have ruptured a small blood vessel in the process -he may have sprained his ankle, as well (historically he did this so his right ankle is at increased susceptibility to injury) -advised to cease that exercise and continue to elevate foot at  rest until swelling resolves -can return to his normal exercise regimen minus the rotation exercises when he is pain and swelling free -also may stop clindamycin as there are no signs of infection  Labs/tests ordered:  No new Next appt:  Keep regular appt with Dr. Nyoka Cowden  Meha Vidrine L. Bentleigh Waren, D.O. [facility] [address], [place] [address] Cell Phone (Mon-Fri 8am-5pm):  [phone] On Call:  [phone] & follow prompts after 5pm & weekends Office Phone:  [phone] Office Fax:  [phone]

## 2015-01-25 NOTE — Patient Instructions (Signed)
Stop those rotating exercises of your ankles.   Stop the clindamycin.

## 2015-01-30 NOTE — Telephone Encounter (Signed)
OK to send Omeprazole 20mg , #90, 1 po qd,3 refills.

## 2015-01-31 ENCOUNTER — Ambulatory Visit (INDEPENDENT_AMBULATORY_CARE_PROVIDER_SITE_OTHER): Payer: Medicare Other | Admitting: Internal Medicine

## 2015-01-31 ENCOUNTER — Encounter: Payer: Self-pay | Admitting: Internal Medicine

## 2015-01-31 VITALS — BP 122/80 | HR 78 | Temp 97.9°F | Resp 20 | Ht 68.5 in | Wt 189.4 lb

## 2015-01-31 DIAGNOSIS — S8391XA Sprain of unspecified site of right knee, initial encounter: Secondary | ICD-10-CM | POA: Insufficient documentation

## 2015-01-31 DIAGNOSIS — F411 Generalized anxiety disorder: Secondary | ICD-10-CM | POA: Diagnosis not present

## 2015-01-31 DIAGNOSIS — S8391XD Sprain of unspecified site of right knee, subsequent encounter: Secondary | ICD-10-CM

## 2015-01-31 MED ORDER — ALPRAZOLAM 0.5 MG PO TABS: ORAL_TABLET | ORAL | Status: DC

## 2015-01-31 MED ORDER — DIAZEPAM 5 MG PO TABS: ORAL_TABLET | ORAL | Status: DC

## 2015-01-31 MED ORDER — OMEPRAZOLE 20 MG PO CPDR: 20.0000 mg | DELAYED_RELEASE_CAPSULE | Freq: Every day | ORAL | Status: DC

## 2015-01-31 NOTE — Progress Notes (Signed)
Patient ID: Alexander [name], male   DOB: 1936-08-25, 79 y.o.   MRN: [id number]    Facility  [facility]    Place of Service:   OFFICE   Allergies  Allergen Reactions  . Heparin     Blood clots   . Aspirin     Breathing complications, able to take three times a week  . Codeine     Some is ok  . Oxycodone     Can not take percocet, palpitations  . Zetia [Ezetimibe]     jittery  . Zocor [Simvastatin]     Liver enzymes increased  . Plavix [Clopidogrel Bisulfate] Palpitations    Chief Complaint  Patient presents with  . Acute Visit    Complains of ongoing Right Leg Concerns. Seen by Dr. Mariea Clonts on 01/25/2015    HPI:   Sprain of right lower leg, subsequent encounter  Seen by Dr. Mariea Clonts 01/25/15: Patient is a 79 y.o. white male seen in the office today for acute visit and ER f/u for right foot swelling after he cleaned off the snow off his car with his sneakers on. He kicked off his sneakers and laid back down for a few hours. Got up and went downstairs and suddenly his foot started hurting. Began right on dorsum of foot, about 1 cm swollen area. Was painful. Next morning took trash out and went back in, sat down on couch and looked at foot. Was then purple, raised and inflamed on right side of dorsum of foot. Called his wife at work b/c it was getting tight and he had trouble walking on the foot. Went to ED. Spread more by the time physician saw him.   2/16, pt with leg pain that started 2/15--went to ED with swollen and hot leg. He was thought to have decreased pulses on the right and mild temp difference. He was diagnosed with probable cellulitis of his right leg. Vasculitis was also a consideration. He had a right lower extremity arterial and venous duplex done of both legs. Both were entirely negative. He was treated with clindamycin for cellulitis. Kidneys and urine also checked and normal.   Swelling was mostly going down at night. Toes never got blood under skin. Pain  gradually has gotten better. Was on his styrofoam roller and laid back to stretch his back. Rotated his ankles 10 times each way (this was on 2/13  Right lower extremity arterial and venous duplex completed 01/23/15.   Right:  No evidence of DVT, superficial thrombosis, or Baker's cyst.  Arterial flow is triphasic throughout without evidence of significant stenosis.  Left:  Negative for DVT in the common femoral vein.  No evidence of significant stenosis and triphasic waveforms noted in the left common femoral artery.   Patient gives additional history today. He says that he was doing some exercises with his legs prior to the onset of the discomfort and swelling. These exercises were twisting his foot firmly to build up strength because of starting some shag dance lessons. He wonders if he could've torn a ligament or sprained his ankle with this.  On examination there is a mildly discolored area about mid lateral right lower leg and a much more darkly discolored area involving the right foot dorsally and around the lateral malleolus. There is tenderness at the right calf and on the dorsal surface of the right foot. There is no other significant deformity. Patient says the previous swelling in these areas has gone down significantly. The right leg  has mild venous varicosity.   Medications: Patient's Medications  New Prescriptions   No medications on file  Previous Medications   ALBUTEROL (PROVENTIL HFA;VENTOLIN HFA) 108 (90 BASE) MCG/ACT INHALER    Inhale 2 puffs into the lungs every 6 (six) hours as needed for wheezing.   ALPRAZOLAM (XANAX) 0.5 MG TABLET    1-2 by mouth daily to help anxiety   AMITRIPTYLINE (ELAVIL) 25 MG TABLET    Take 6.25 mg by mouth at bedtime. Takes 1/4 tablet   ASPIRIN 81 MG TABLET    Take 81 mg by mouth 3 (three) times a week. Mondays, wednesdays and fridays   CHOLECALCIFEROL (VITAMIN D) 1000 UNITS TABLET    Take 1,000 Units by mouth daily.   CLINDAMYCIN (CLEOCIN) 300 MG  CAPSULE    Take 1 capsule (300 mg total) by mouth 4 (four) times daily. X 7 days   CYCLOBENZAPRINE (FLEXERIL) 10 MG TABLET    Take 2.5 mg by mouth 3 (three) times daily as needed for muscle spasms. Takes 1/4 tablet when needed for spasms   DICLOFENAC (VOLTAREN) 75 MG EC TABLET    Take 1 tablet (75 mg total) by mouth every morning.   FLAXSEED, LINSEED, 1000 MG CAPS    Take 1 capsule by mouth daily.   OMEPRAZOLE (PRILOSEC) 20 MG CAPSULE    Take 1 capsule (20 mg total) by mouth daily.   ROSUVASTATIN (CRESTOR) 10 MG TABLET    Take 1 tablet (10 mg total) by mouth daily.   UNDECYLENIC ACID (FUNGI-NAIL) 25 % SOLN    Apply daily to affected toenail  Modified Medications   Modified Medication Previous Medication   DIAZEPAM (VALIUM) 5 MG TABLET diazepam (VALIUM) 5 MG tablet      Take one tablet by mouth once daily to help rest    One daily to help rest  Discontinued Medications   No medications on file     Review of Systems  Constitutional: Negative for fever, chills, activity change, appetite change, fatigue and unexpected weight change.  HENT: Positive for hearing loss. Negative for congestion and ear pain.   Eyes: Negative.   Respiratory: Negative.  Negative for choking, chest tightness and shortness of breath.   Cardiovascular: Negative.  Negative for chest pain, palpitations and leg swelling.  Gastrointestinal: Negative for nausea and diarrhea.       RUQ discomfort. Had cholecystectomy in Dec 2014.  Endocrine: Negative.   Genitourinary: Negative.   Musculoskeletal: Negative.   Skin:       Bruising of the right leg laterally and of the right foot.  Allergic/Immunologic: Negative.   Neurological: Negative.   Hematological: Negative.   Psychiatric/Behavioral: Negative.     Filed Vitals:   01/31/15 1301  BP: 122/80  Pulse: 78  Temp: 97.9 F (36.6 C)  TempSrc: Oral  Resp: 20  Height: 5' 8.5" (1.74 m)  Weight: 189 lb 6.4 oz (85.911 kg)   Body mass index is 28.38  kg/(m^2).  Physical Exam  Constitutional: He appears well-developed and well-nourished. No distress.  HENT:  Head: Normocephalic and atraumatic.  Right Ear: External ear normal.  Left Ear: External ear normal.  Nose: Nose normal.  Mouth/Throat: Oropharynx is clear and moist.  Dentures. Missing some teeth.  Eyes: Conjunctivae and EOM are normal. Pupils are equal, round, and reactive to light.  Neck: No JVD present. No tracheal deviation present. No thyromegaly present.  Cardiovascular: Normal rate, regular rhythm, normal heart sounds and intact distal pulses.  Exam reveals no  gallop and no friction rub.   No murmur heard. Pulmonary/Chest: No respiratory distress. He has no wheezes. He has no rales. He exhibits no tenderness.  Abdominal: He exhibits no distension and no mass. There is no tenderness. There is no rebound and no guarding. No hernia.  post laparoscopic cholecystectomy scars.   Genitourinary:  Enlarged prostate  Musculoskeletal: He exhibits no edema or tenderness.  Neurological: He displays normal reflexes. No cranial nerve deficit. Coordination normal.  Skin: No rash noted. No erythema. No pallor.  Bruise of the right lower leg laterally about midway and of the right foot dorsum wrapping around the right lateral malleolus.  Psychiatric: He has a normal mood and affect. His behavior is normal. Judgment and thought content normal.  Nursing note and vitals reviewed.    Labs reviewed: Admission on 01/23/2015, Discharged on 01/23/2015  Component Date Value Ref Range Status  . WBC 01/23/2015 7.1  4.0 - 10.5 K/uL Final  . RBC 01/23/2015 5.03  4.22 - 5.81 MIL/uL Final  . Hemoglobin 01/23/2015 16.0  13.0 - 17.0 g/dL Final  . HCT 01/23/2015 47.2  39.0 - 52.0 % Final  . MCV 01/23/2015 93.8  78.0 - 100.0 fL Final  . MCH 01/23/2015 31.8  26.0 - 34.0 pg Final  . MCHC 01/23/2015 33.9  30.0 - 36.0 g/dL Final  . RDW 01/23/2015 12.6  11.5 - 15.5 % Final  . Platelets 01/23/2015 224   150 - 400 K/uL Final  . Neutrophils Relative % 01/23/2015 72  43 - 77 % Final  . Neutro Abs 01/23/2015 5.1  1.7 - 7.7 K/uL Final  . Lymphocytes Relative 01/23/2015 18  12 - 46 % Final  . Lymphs Abs 01/23/2015 1.3  0.7 - 4.0 K/uL Final  . Monocytes Relative 01/23/2015 10  3 - 12 % Final  . Monocytes Absolute 01/23/2015 0.7  0.1 - 1.0 K/uL Final  . Eosinophils Relative 01/23/2015 0  0 - 5 % Final  . Eosinophils Absolute 01/23/2015 0.0  0.0 - 0.7 K/uL Final  . Basophils Relative 01/23/2015 0  0 - 1 % Final  . Basophils Absolute 01/23/2015 0.0  0.0 - 0.1 K/uL Final  . Sodium 01/23/2015 138  135 - 145 mmol/L Final  . Potassium 01/23/2015 4.5  3.5 - 5.1 mmol/L Final  . Chloride 01/23/2015 104  96 - 112 mmol/L Final  . CO2 01/23/2015 27  19 - 32 mmol/L Final  . Glucose, Bld 01/23/2015 110* 70 - 99 mg/dL Final  . BUN 01/23/2015 23  6 - 23 mg/dL Final  . Creatinine, Ser 01/23/2015 0.95  0.50 - 1.35 mg/dL Final  . Calcium 01/23/2015 9.2  8.4 - 10.5 mg/dL Final  . GFR calc non Af Amer 01/23/2015 79* >90 mL/min Final  . GFR calc Af Amer 01/23/2015 >90  >90 mL/min Final   Comment: (NOTE) The eGFR has been calculated using the CKD EPI equation. This calculation has not been validated in all clinical situations. eGFR's persistently <90 mL/min signify possible Chronic Kidney Disease.   . Anion gap 01/23/2015 7  5 - 15 Final  . Color, Urine 01/23/2015 YELLOW  YELLOW Final  . APPearance 01/23/2015 CLEAR  CLEAR Final  . Specific Gravity, Urine 01/23/2015 1.009  1.005 - 1.030 Final  . pH 01/23/2015 5.5  5.0 - 8.0 Final  . Glucose, UA 01/23/2015 NEGATIVE  NEGATIVE mg/dL Final  . Hgb urine dipstick 01/23/2015 TRACE* NEGATIVE Final  . Bilirubin Urine 01/23/2015 NEGATIVE  NEGATIVE Final  .  Ketones, ur 01/23/2015 NEGATIVE  NEGATIVE mg/dL Final  . Protein, ur 01/23/2015 NEGATIVE  NEGATIVE mg/dL Final  . Urobilinogen, UA 01/23/2015 0.2  0.0 - 1.0 mg/dL Final  . Nitrite 01/23/2015 NEGATIVE  NEGATIVE Final   . Leukocytes, UA 01/23/2015 NEGATIVE  NEGATIVE Final  . WBC, UA 01/23/2015 0-2  <3 WBC/hpf Final  . RBC / HPF 01/23/2015 0-2  <3 RBC/hpf Final  . Bacteria, UA 01/23/2015 RARE  RARE Final  Appointment on 01/23/2015  Component Date Value Ref Range Status  . H. PYLORI Antigen  01/23/2015 NEGATIVE   Final  . H. PYLORI Antigen  01/23/2015 Antimicrobials,proton pump inhibitors and bismuth   Final  . H. PYLORI Antigen  01/23/2015 preparations are known to suppress H.pylori and ingestion of   Final  . H. PYLORI Antigen  01/23/2015 these prior to testing may cause a false negative result. If   Final  . H. PYLORI Antigen  01/23/2015 a negative result is obtained for a patient that has    Final  . H. PYLORI Antigen  01/23/2015 ingested these compounds within two weeks prior of   Final  . H. PYLORI Antigen  01/23/2015 performing the H.pylori test, results may be falsely   Final  . H. PYLORI Antigen  01/23/2015 negative and should be repeated with a new specimen two   Final  . H. PYLORI Antigen  01/23/2015 weeks after discontinuing treatment.   Final     Assessment/Plan 1. Anxiety state - diazepam (VALIUM) 5 MG tablet; Take one tablet by mouth once daily to help rest  Dispense: 30 tablet; Refill: 5  2. Sprain of right lower leg, subsequent encounter I do not think there is current evidence to suggest cellulitis of his leg despite the previous diagnosis. I think some sort of traumatic injury or possible spontaneous rupture of the vein occurred that has caused the tenderness and swelling. The bruising of the area would tend to justify this conclusion.  I believe that this problem will resolve itself without further intervention. Patient was advised to resume usual activities.

## 2015-01-31 NOTE — Patient Instructions (Signed)
Resume usual activity. This condition will clear up over the next few weeks.

## 2015-01-31 NOTE — Telephone Encounter (Signed)
Patient notified of recommendation. Rx sent.

## 2015-02-02 ENCOUNTER — Telehealth: Payer: Self-pay | Admitting: Internal Medicine

## 2015-02-02 NOTE — Telephone Encounter (Signed)
Spoke with patient and he states he had one episode of abdomen pain after drinking wine last night. He took Gaviscon and Omeprazole. The pain went away. He will continue to take Omeprazole daily and use Gaviscon prn.

## 2015-02-06 DIAGNOSIS — H2511 Age-related nuclear cataract, right eye: Secondary | ICD-10-CM | POA: Diagnosis not present

## 2015-02-06 DIAGNOSIS — H02839 Dermatochalasis of unspecified eye, unspecified eyelid: Secondary | ICD-10-CM | POA: Diagnosis not present

## 2015-02-06 DIAGNOSIS — H18411 Arcus senilis, right eye: Secondary | ICD-10-CM | POA: Diagnosis not present

## 2015-02-06 DIAGNOSIS — H18412 Arcus senilis, left eye: Secondary | ICD-10-CM | POA: Diagnosis not present

## 2015-02-08 DIAGNOSIS — S93491A Sprain of other ligament of right ankle, initial encounter: Secondary | ICD-10-CM | POA: Diagnosis not present

## 2015-02-08 DIAGNOSIS — M19071 Primary osteoarthritis, right ankle and foot: Secondary | ICD-10-CM | POA: Diagnosis not present

## 2015-02-12 DIAGNOSIS — H2511 Age-related nuclear cataract, right eye: Secondary | ICD-10-CM | POA: Diagnosis not present

## 2015-02-12 DIAGNOSIS — H25811 Combined forms of age-related cataract, right eye: Secondary | ICD-10-CM | POA: Diagnosis not present

## 2015-02-13 ENCOUNTER — Telehealth: Payer: Self-pay | Admitting: Internal Medicine

## 2015-02-13 DIAGNOSIS — R197 Diarrhea, unspecified: Secondary | ICD-10-CM

## 2015-02-13 DIAGNOSIS — H2512 Age-related nuclear cataract, left eye: Secondary | ICD-10-CM | POA: Diagnosis not present

## 2015-02-13 MED ORDER — DICYCLOMINE HCL 10 MG PO CAPS: ORAL_CAPSULE | ORAL | Status: DC

## 2015-02-13 NOTE — Telephone Encounter (Signed)
Patient calling to report for several weeks, he has had a loose stool in AM the diarrhea x 2 during the day. No cramping. Has not taking anything for this. Hx c. Diff in past. Please, advise.

## 2015-02-13 NOTE — Telephone Encounter (Signed)
Patient given recommendations. Rx sent and labs in EPIC.

## 2015-02-13 NOTE — Telephone Encounter (Signed)
Please obtain stool for C.Diff and Lactoferin and take Bentyl 10 mg bid ( he may have some ), #60 , 1 po bid, 1 refill.

## 2015-02-14 ENCOUNTER — Other Ambulatory Visit: Payer: Medicare Other

## 2015-02-14 DIAGNOSIS — R197 Diarrhea, unspecified: Secondary | ICD-10-CM

## 2015-02-14 DIAGNOSIS — H2511 Age-related nuclear cataract, right eye: Secondary | ICD-10-CM | POA: Diagnosis not present

## 2015-02-15 ENCOUNTER — Telehealth: Payer: Self-pay | Admitting: *Deleted

## 2015-02-15 DIAGNOSIS — L738 Other specified follicular disorders: Secondary | ICD-10-CM | POA: Diagnosis not present

## 2015-02-15 LAB — CLOSTRIDIUM DIFFICILE BY PCR: Toxigenic C. Difficile by PCR: DETECTED — CR

## 2015-02-15 LAB — FECAL LACTOFERRIN, QUANT: Lactoferrin: NEGATIVE

## 2015-02-15 MED ORDER — METRONIDAZOLE 500 MG PO TABS: ORAL_TABLET | ORAL | Status: DC

## 2015-02-15 NOTE — Telephone Encounter (Signed)
Metronidazole 500 mg tid x 14 days  Ask him to check in by phone on Mon or Tues with an update re: is he getting better (can take 5 d to start to see improvement) and sooner if getting worse

## 2015-02-15 NOTE — Telephone Encounter (Signed)
I agree with continuing Bentyl 10 mg, 1 po tid prn

## 2015-02-15 NOTE — Telephone Encounter (Signed)
Received a call from lab that patient has a positive C. Diff test. Dr. [name] is off. DOD- Carlean Purl. Please, advise.

## 2015-02-15 NOTE — Telephone Encounter (Signed)
Patient notified of results. Rx sent to pharmacy. He will call Monday or Tuesday with update.

## 2015-02-15 NOTE — Telephone Encounter (Signed)
Patient is asking if he can take Bentyl daily since he is c. Diff +. Please, advise.

## 2015-02-16 ENCOUNTER — Other Ambulatory Visit: Payer: Self-pay | Admitting: *Deleted

## 2015-02-16 DIAGNOSIS — R197 Diarrhea, unspecified: Secondary | ICD-10-CM

## 2015-02-16 NOTE — Telephone Encounter (Signed)
Lab in EPIC. Left a message for patient.+

## 2015-02-16 NOTE — Telephone Encounter (Signed)
Patient notified of recommendations. He is also asking if he will be tested when he completes the antibiotic to be sure c.diff is gone. Please, advise.

## 2015-02-16 NOTE — Telephone Encounter (Signed)
We can do that. I would wait till he completes the antibiotic course.

## 2015-02-21 ENCOUNTER — Telehealth: Payer: Self-pay | Admitting: *Deleted

## 2015-02-21 NOTE — Telephone Encounter (Signed)
Calling with update 

## 2015-02-21 NOTE — Telephone Encounter (Signed)
Left a message for patient to call back. 

## 2015-02-21 NOTE — Telephone Encounter (Signed)
Spoke with patient and diarrhea has stopped and he is doing better.

## 2015-02-22 ENCOUNTER — Telehealth: Payer: Self-pay | Admitting: Internal Medicine

## 2015-02-22 NOTE — Telephone Encounter (Signed)
I agree with stopping eye drops. Flagyl less likely to cause lip swelling. He was on Flagyl before for H.pylori

## 2015-02-22 NOTE — Telephone Encounter (Signed)
Patient has spoken to his eye doctor and he thinks one of the eye drops he is using caused the bottom lip to swell. He is going to stop this drop and see if this is the problem. Just wanted to let us know. He will call us back if swelling does not get better or gets worse.

## 2015-02-26 ENCOUNTER — Other Ambulatory Visit (INDEPENDENT_AMBULATORY_CARE_PROVIDER_SITE_OTHER): Payer: Medicare Other | Admitting: *Deleted

## 2015-02-26 DIAGNOSIS — E785 Hyperlipidemia, unspecified: Secondary | ICD-10-CM | POA: Diagnosis not present

## 2015-02-26 LAB — LIPID PANEL
CHOL/HDL RATIO: 3
Cholesterol: 151 mg/dL (ref 0–200)
HDL: 51.1 mg/dL (ref >39.00)
LDL Cholesterol: 82 mg/dL (ref 0–99)
NONHDL: 99.9
Triglycerides: 92 mg/dL (ref 0.0–149.0)
VLDL: 18.4 mg/dL (ref 0.0–40.0)

## 2015-02-26 LAB — ALT: ALT: 30 U/L (ref 0–53)

## 2015-03-01 ENCOUNTER — Telehealth: Payer: Self-pay

## 2015-03-01 DIAGNOSIS — E785 Hyperlipidemia, unspecified: Secondary | ICD-10-CM

## 2015-03-01 NOTE — Telephone Encounter (Signed)
-----   Message from Belva Crome, MD sent at 02/26/2015  7:05 PM EDT ----- The LDL cholesterol is now near target. No changes needed. Repeat in 6 months. Liver function is also normal.

## 2015-03-01 NOTE — Telephone Encounter (Signed)
Pt aware of lab results.The LDL cholesterol is now near target. No changes needed. Repeat in 6 months. Liver function is also normal. Pt verbalized understanding.

## 2015-03-12 DIAGNOSIS — H25812 Combined forms of age-related cataract, left eye: Secondary | ICD-10-CM | POA: Diagnosis not present

## 2015-03-12 DIAGNOSIS — H2512 Age-related nuclear cataract, left eye: Secondary | ICD-10-CM | POA: Diagnosis not present

## 2015-03-14 ENCOUNTER — Ambulatory Visit (INDEPENDENT_AMBULATORY_CARE_PROVIDER_SITE_OTHER): Payer: Medicare Other | Admitting: Internal Medicine

## 2015-03-14 ENCOUNTER — Encounter: Payer: Self-pay | Admitting: Internal Medicine

## 2015-03-14 VITALS — BP 120/72 | HR 73 | Temp 97.8°F | Ht 68.5 in | Wt 189.2 lb

## 2015-03-14 DIAGNOSIS — S8391XD Sprain of unspecified site of right knee, subsequent encounter: Secondary | ICD-10-CM

## 2015-03-14 NOTE — Progress Notes (Signed)
Patient ID: Alexander [name], male   DOB: 1936/01/18, 79 y.o.   MRN: [id number]    Facility  [facility]    Place of Service:   OFFICE   Allergies  Allergen Reactions  . Heparin     Blood clots   . Aspirin     Breathing complications, able to take three times a week  . Codeine     Some is ok  . Oxycodone     Can not take percocet, palpitations  . Zetia [Ezetimibe]     jittery  . Zocor [Simvastatin]     Liver enzymes increased  . Plavix [Clopidogrel Bisulfate] Palpitations    Chief Complaint  Patient presents with  . Acute Visit    Complains of Right Ankle still hurting    HPI:  Sprain of right lower leg, subsequent encounter -area of the right ankle and lower leg described on his visit from 01/31/2015 seemed to be getting better. He began working out again and on the third day of working on the treadmill his ankle began swelling again and became uncomfortable. He has seen Dr. Luciana Axe tech, orthopedist. She felt that he had had a hemorrhage on this side from a burst blood vessel somewhere in the lower leg. He has also had Dopplers of the venous system and arterial system of the right leg without any significant findings.  On today's visit he recalls that he sprained the right ankle about 2 years ago. He feels that was a significant sprain. He also had the story of twisting his right foot intentionally to strengthen it to take [facility].  The area looks much as it did at the time of his visit 01/31/2015. There is some discoloration of the skin. There is tenderness anterior and dorsal to the right lateral malleolus. There is tenderness that extends up the right lower leg.   Medications: Patient's Medications  New Prescriptions   No medications on file  Previous Medications   ALBUTEROL (PROVENTIL HFA;VENTOLIN HFA) 108 (90 BASE) MCG/ACT INHALER    Inhale 2 puffs into the lungs every 6 (six) hours as needed for wheezing.   ALPRAZOLAM (XANAX) 0.5 MG TABLET    1-2 by mouth daily to  help anxiety   AMITRIPTYLINE (ELAVIL) 25 MG TABLET    Take 6.25 mg by mouth at bedtime. Takes 1/4 tablet   ASPIRIN 81 MG TABLET    Take 81 mg by mouth 3 (three) times a week. Mondays, wednesdays and fridays   CHOLECALCIFEROL (VITAMIN D) 1000 UNITS TABLET    Take 1,000 Units by mouth daily.   CYCLOBENZAPRINE (FLEXERIL) 10 MG TABLET    Take 2.5 mg by mouth 3 (three) times daily as needed for muscle spasms. Takes 1/4 tablet when needed for spasms   DIAZEPAM (VALIUM) 5 MG TABLET    Take one tablet by mouth once daily to help rest   DICLOFENAC (VOLTAREN) 75 MG EC TABLET    Take 1 tablet (75 mg total) by mouth every morning.   DICYCLOMINE (BENTYL) 10 MG CAPSULE    Take one po BID   FLAXSEED, LINSEED, 1000 MG CAPS    Take 1 capsule by mouth daily.   OMEPRAZOLE (PRILOSEC) 20 MG CAPSULE    Take 1 capsule (20 mg total) by mouth daily.   ROSUVASTATIN (CRESTOR) 10 MG TABLET    Take 1 tablet (10 mg total) by mouth daily.   UNDECYLENIC ACID (FUNGI-NAIL) 25 % SOLN    Apply daily to affected toenail  Modified Medications   No medications on file  Discontinued Medications   CLINDAMYCIN (CLEOCIN) 300 MG CAPSULE    Take 1 capsule (300 mg total) by mouth 4 (four) times daily. X 7 days   METRONIDAZOLE (FLAGYL) 500 MG TABLET    Take one po TID x 14 days     Review of Systems  Constitutional: Negative for fever, chills, activity change, appetite change, fatigue and unexpected weight change.  HENT: Positive for hearing loss. Negative for congestion and ear pain.   Eyes: Negative.   Respiratory: Negative.  Negative for choking, chest tightness and shortness of breath.   Cardiovascular: Negative.  Negative for chest pain, palpitations and leg swelling.  Gastrointestinal: Negative for nausea and diarrhea.       RUQ discomfort. Had cholecystectomy in Dec 2014.  Endocrine: Negative.   Genitourinary: Negative.   Musculoskeletal: Negative.   Skin:       Bruising of the right leg laterally and of the right foot  since Feb 2016.Marland Kitchen  Allergic/Immunologic: Negative.   Neurological: Negative.   Hematological: Negative.   Psychiatric/Behavioral: Negative.     Filed Vitals:   03/14/15 1522  BP: 120/72  Pulse: 73  Temp: 97.8 F (36.6 C)  TempSrc: Oral  Height: 5' 8.5" (1.74 m)  Weight: 189 lb 3.2 oz (85.821 kg)   Body mass index is 28.35 kg/(m^2).  Physical Exam  Constitutional: He appears well-developed and well-nourished. No distress.  HENT:  Head: Normocephalic and atraumatic.  Right Ear: External ear normal.  Left Ear: External ear normal.  Nose: Nose normal.  Mouth/Throat: Oropharynx is clear and moist.  Dentures. Missing some teeth.  Eyes: Conjunctivae and EOM are normal. Pupils are equal, round, and reactive to light.  Neck: No JVD present. No tracheal deviation present. No thyromegaly present.  Cardiovascular: Normal rate, regular rhythm, normal heart sounds and intact distal pulses.  Exam reveals no gallop and no friction rub.   No murmur heard. Pulmonary/Chest: No respiratory distress. He has no wheezes. He has no rales. He exhibits no tenderness.  Abdominal: He exhibits no distension and no mass. There is no tenderness. There is no rebound and no guarding. No hernia.  post laparoscopic cholecystectomy scars.   Genitourinary:  Enlarged prostate  Musculoskeletal: He exhibits tenderness (anterior to the right lateral malleolus). He exhibits no edema.  Neurological: He displays normal reflexes. No cranial nerve deficit. Coordination normal.  Skin: No rash noted. No erythema. No pallor.  Bruise of the right lower leg laterally about midway and of the right foot dorsum wrapping around the right lateral malleolus.  Psychiatric: He has a normal mood and affect. His behavior is normal. Judgment and thought content normal.  Nursing note and vitals reviewed.    Labs reviewed: Lab on 02/26/2015  Component Date Value Ref Range Status  . Cholesterol 02/26/2015 151  0 - 200 mg/dL Final    ATP III Classification       Desirable:  < 200 mg/dL               Borderline High:  200 - 239 mg/dL          High:  > = 240 mg/dL  . Triglycerides 02/26/2015 92.0  0.0 - 149.0 mg/dL Final   Normal:  <150 mg/dLBorderline High:  150 - 199 mg/dL  . HDL 02/26/2015 51.10  >39.00 mg/dL Final  . VLDL 02/26/2015 18.4  0.0 - 40.0 mg/dL Final  . LDL Cholesterol 02/26/2015 82  0 - 99 mg/dL  Final  . Total CHOL/HDL Ratio 02/26/2015 3   Final                  Men          Women1/2 Average Risk     3.4          3.3Average Risk          5.0          4.42X Average Risk          9.6          7.13X Average Risk          15.0          11.0                      . NonHDL 02/26/2015 99.90   Final   NOTE:  Non-HDL goal should be 30 mg/dL higher than patient's LDL goal (i.e. LDL goal of < 70 mg/dL, would have non-HDL goal of < 100 mg/dL)  . ALT 02/26/2015 30  0 - 53 U/L Final  Appointment on 02/14/2015  Component Date Value Ref Range Status  . C difficile by pcr 02/14/2015 Detected* Not Detected Final   Comment: This test is for use only with liquid or soft stools; performance characteristics of other clinical specimen types have not been established.   This assay was performed by Cepheid GeneXpert(R) PCR. The performance characteristics of this assay have been determined by [employer]. Performance characteristics refer to the analytical performance of the test.   . Lactoferrin 02/14/2015 NEGATIVE   Final  Admission on 01/23/2015, Discharged on 01/23/2015  Component Date Value Ref Range Status  . WBC 01/23/2015 7.1  4.0 - 10.5 K/uL Final  . RBC 01/23/2015 5.03  4.22 - 5.81 MIL/uL Final  . Hemoglobin 01/23/2015 16.0  13.0 - 17.0 g/dL Final  . HCT 01/23/2015 47.2  39.0 - 52.0 % Final  . MCV 01/23/2015 93.8  78.0 - 100.0 fL Final  . MCH 01/23/2015 31.8  26.0 - 34.0 pg Final  . MCHC 01/23/2015 33.9  30.0 - 36.0 g/dL Final  . RDW 01/23/2015 12.6  11.5 - 15.5 % Final  . Platelets 01/23/2015 224  150 -  400 K/uL Final  . Neutrophils Relative % 01/23/2015 72  43 - 77 % Final  . Neutro Abs 01/23/2015 5.1  1.7 - 7.7 K/uL Final  . Lymphocytes Relative 01/23/2015 18  12 - 46 % Final  . Lymphs Abs 01/23/2015 1.3  0.7 - 4.0 K/uL Final  . Monocytes Relative 01/23/2015 10  3 - 12 % Final  . Monocytes Absolute 01/23/2015 0.7  0.1 - 1.0 K/uL Final  . Eosinophils Relative 01/23/2015 0  0 - 5 % Final  . Eosinophils Absolute 01/23/2015 0.0  0.0 - 0.7 K/uL Final  . Basophils Relative 01/23/2015 0  0 - 1 % Final  . Basophils Absolute 01/23/2015 0.0  0.0 - 0.1 K/uL Final  . Sodium 01/23/2015 138  135 - 145 mmol/L Final  . Potassium 01/23/2015 4.5  3.5 - 5.1 mmol/L Final  . Chloride 01/23/2015 104  96 - 112 mmol/L Final  . CO2 01/23/2015 27  19 - 32 mmol/L Final  . Glucose, Bld 01/23/2015 110* 70 - 99 mg/dL Final  . BUN 01/23/2015 23  6 - 23 mg/dL Final  . Creatinine, Ser 01/23/2015 0.95  0.50 - 1.35 mg/dL Final  . Calcium 01/23/2015 9.2  8.4 -  10.5 mg/dL Final  . GFR calc non Af Amer 01/23/2015 79* >90 mL/min Final  . GFR calc Af Amer 01/23/2015 >90  >90 mL/min Final   Comment: (NOTE) The eGFR has been calculated using the CKD EPI equation. This calculation has not been validated in all clinical situations. eGFR's persistently <90 mL/min signify possible Chronic Kidney Disease.   . Anion gap 01/23/2015 7  5 - 15 Final  . Color, Urine 01/23/2015 YELLOW  YELLOW Final  . APPearance 01/23/2015 CLEAR  CLEAR Final  . Specific Gravity, Urine 01/23/2015 1.009  1.005 - 1.030 Final  . pH 01/23/2015 5.5  5.0 - 8.0 Final  . Glucose, UA 01/23/2015 NEGATIVE  NEGATIVE mg/dL Final  . Hgb urine dipstick 01/23/2015 TRACE* NEGATIVE Final  . Bilirubin Urine 01/23/2015 NEGATIVE  NEGATIVE Final  . Ketones, ur 01/23/2015 NEGATIVE  NEGATIVE mg/dL Final  . Protein, ur 01/23/2015 NEGATIVE  NEGATIVE mg/dL Final  . Urobilinogen, UA 01/23/2015 0.2  0.0 - 1.0 mg/dL Final  . Nitrite 01/23/2015 NEGATIVE  NEGATIVE Final  .  Leukocytes, UA 01/23/2015 NEGATIVE  NEGATIVE Final  . WBC, UA 01/23/2015 0-2  <3 WBC/hpf Final  . RBC / HPF 01/23/2015 0-2  <3 RBC/hpf Final  . Bacteria, UA 01/23/2015 RARE  RARE Final  Appointment on 01/23/2015  Component Date Value Ref Range Status  . H. PYLORI Antigen  01/23/2015 NEGATIVE   Final  . H. PYLORI Antigen  01/23/2015 Antimicrobials,proton pump inhibitors and bismuth   Final  . H. PYLORI Antigen  01/23/2015 preparations are known to suppress H.pylori and ingestion of   Final  . H. PYLORI Antigen  01/23/2015 these prior to testing may cause a false negative result. If   Final  . H. PYLORI Antigen  01/23/2015 a negative result is obtained for a patient that has    Final  . H. PYLORI Antigen  01/23/2015 ingested these compounds within two weeks prior of   Final  . H. PYLORI Antigen  01/23/2015 performing the H.pylori test, results may be falsely   Final  . H. PYLORI Antigen  01/23/2015 negative and should be repeated with a new specimen two   Final  . H. PYLORI Antigen  01/23/2015 weeks after discontinuing treatment.   Final     Assessment/Plan  1. Sprain of right lower leg, subsequent encounter MRI ordered to see if there is soft tissue injury or even a fibular fracture. Depending on result of the MRI, he might benefit from ankle brace. - MR Ankle Right  Wo Contrast; Future - MR Tibia Fibula Left Wo Contrast; Future

## 2015-03-19 ENCOUNTER — Telehealth: Payer: Self-pay

## 2015-03-19 NOTE — Telephone Encounter (Signed)
Patient with pending appointment for MRI 03/26/15. Patient states he still has swelling (more intense) and sharp pains in leg. 7, on scale of 1-10. Patient would like to know if Dr.Green can do anything for him while he awaits appointment for MRI.  Please advise

## 2015-03-20 DIAGNOSIS — L821 Other seborrheic keratosis: Secondary | ICD-10-CM | POA: Diagnosis not present

## 2015-03-20 DIAGNOSIS — Z85828 Personal history of other malignant neoplasm of skin: Secondary | ICD-10-CM | POA: Diagnosis not present

## 2015-03-20 DIAGNOSIS — L720 Epidermal cyst: Secondary | ICD-10-CM | POA: Diagnosis not present

## 2015-03-21 NOTE — Telephone Encounter (Signed)
Prescribed gabapentin 300 mg (dispense 60 tablets) one twice daily to help control pain.

## 2015-03-22 ENCOUNTER — Ambulatory Visit
Admission: RE | Admit: 2015-03-22 | Discharge: 2015-03-22 | Disposition: A | Discharge status: Home / Self Care | Payer: Medicare Other | Source: Ambulatory Visit | Attending: Internal Medicine | Admitting: Internal Medicine

## 2015-03-22 DIAGNOSIS — M65871 Other synovitis and tenosynovitis, right ankle and foot: Secondary | ICD-10-CM | POA: Diagnosis not present

## 2015-03-22 DIAGNOSIS — S8391XD Sprain of unspecified site of right knee, subsequent encounter: Secondary | ICD-10-CM

## 2015-03-22 DIAGNOSIS — R600 Localized edema: Secondary | ICD-10-CM | POA: Diagnosis not present

## 2015-03-22 DIAGNOSIS — M19071 Primary osteoarthritis, right ankle and foot: Secondary | ICD-10-CM | POA: Diagnosis not present

## 2015-03-22 NOTE — Telephone Encounter (Signed)
Spoke with patient, patient states he was able to get MRI moved [place] to [place] at 9:00 am. Patient would like to hold off on rx for gabapentin until MRI results back.   Will send to Dr.Green as a [place]

## 2015-03-22 NOTE — Telephone Encounter (Signed)
OK 

## 2015-03-23 ENCOUNTER — Telehealth: Payer: Self-pay

## 2015-03-23 NOTE — Telephone Encounter (Signed)
Message left on triage voicemail: Patient would like results of MRI  Please advise

## 2015-03-26 ENCOUNTER — Other Ambulatory Visit: Payer: Medicare Other

## 2015-03-26 NOTE — Telephone Encounter (Signed)
Patient called requesting results again, please advise.  °

## 2015-03-28 NOTE — Telephone Encounter (Signed)
Discussed with patient. Mild copy of radiology reports. Recommended anti-inflammatory. He has Celebrex on hand. Also recommended that he avoid exercises that involved muscles and tendons below the knee.

## 2015-04-12 ENCOUNTER — Ambulatory Visit (INDEPENDENT_AMBULATORY_CARE_PROVIDER_SITE_OTHER): Payer: Medicare Other | Admitting: Internal Medicine

## 2015-04-12 ENCOUNTER — Encounter: Payer: Self-pay | Admitting: Internal Medicine

## 2015-04-12 VITALS — BP 102/58 | HR 81 | Temp 98.4°F | Resp 18 | Ht 66.5 in | Wt 191.4 lb

## 2015-04-12 DIAGNOSIS — Z8719 Personal history of other diseases of the digestive system: Secondary | ICD-10-CM

## 2015-04-12 DIAGNOSIS — J209 Acute bronchitis, unspecified: Secondary | ICD-10-CM

## 2015-04-12 DIAGNOSIS — IMO0001 Reserved for inherently not codable concepts without codable children: Secondary | ICD-10-CM

## 2015-04-12 DIAGNOSIS — M65271 Calcific tendinitis, right ankle and foot: Secondary | ICD-10-CM

## 2015-04-12 MED ORDER — AZITHROMYCIN 250 MG PO TABS: ORAL_TABLET | ORAL | Status: DC

## 2015-04-12 NOTE — Patient Instructions (Addendum)
Drink plenty of water (at least 6-8 8oz glasses water).  Use albuterol in the morning and again 6 hours later to help with the breathing.  Warm humidity.    Mucinex 600mg  twice a day for your congestion.  Use until your mucus production decreases.    If your temperature goes up over 100 or you develop green sputum or you are not getting better by Sunday, begin the zpak.  Use probiotic for at least a week if you take the zpak

## 2015-04-12 NOTE — Progress Notes (Signed)
Patient ID: Alexander [name], male   DOB: 12/12/1935, 79 y.o.   MRN: [id number]   Location:  [facility]  Allergies  Allergen Reactions  . Heparin     Blood clots   . Aspirin     Breathing complications, able to take three times a week  . Codeine     Some is ok  . Oxycodone     Can not take percocet, palpitations  . Zetia [Ezetimibe]     jittery  . Zocor [Simvastatin]     Liver enzymes increased  . Plavix [Clopidogrel Bisulfate] Palpitations    Chief Complaint  Patient presents with  . Acute Visit    Patient c/o cold     HPI: Patient is a 79 y.o. white male patient of Dr. Rolly Salter seen in the office today for an acute visit due to his cold.  Started feeling bad a couple of days ago.  Coughed violently last night, dry heaves. Watery eyes to extreme.  Fever 99.5, then 99.8 and drank a lot of water.  Comes in waves.  Had been wheezing a little Monday morning (3 days ago), but back now.    Went to orthopedics and thought to be sprain and burst blood vessel.  Had MRI.  Reviewed again with him.  Dr. Nyoka Cowden had reviewed already by phone.  Walked three miles with his wife Sunday.  A little swelling Monday, but Tuesday, was doing some plantarflexion of it and it was a loud pop.  Was very painful Tuesday to where he couldn't walk on it and it was very swollen on dorsolateral aspect again.    Had c diff from too many abx before.  Had pneumonia after heart surgery.    Has only used albuterol 3x in last month.    Review of Systems:  Review of Systems  Constitutional: Positive for fever and malaise/fatigue. Negative for chills and weight loss.  HENT: Positive for congestion. Negative for ear pain and sore throat.   Respiratory: Positive for cough and sputum production. Negative for shortness of breath.   Cardiovascular: Positive for leg swelling. Negative for chest pain.       Right ankle and foot  Gastrointestinal: Negative for nausea, vomiting,  abdominal pain and diarrhea.  Genitourinary: Negative for dysuria, urgency and frequency.  Musculoskeletal: Negative for falls.  Neurological: Negative for dizziness, weakness and headaches.  Psychiatric/Behavioral: The patient is nervous/anxious.      Past Medical History  Diagnosis Date  . Pneumonia   . Pulmonary embolism 2012    s/p heart surgery  . Hyperlipidemia   . Dysuria   . Clotting disorder   . Difficulty urinating   . Rheumatic fever age 31  . History of anal fissures   . Anxiety   . Diverticulosis of colon (without mention of hemorrhage)     mild  . Internal hemorrhoids without mention of complication   . Allergic rhinitis due to pollen   . History of cholelithiasis   . Edema   . Nonspecific elevation of levels of transaminase or lactic acid dehydrogenase (LDH)   . Long term (current) use of anticoagulants   . Other voice and resonance disorders     after surgery, took breathing tube out and damaged something  . Coronary atherosclerosis of native coronary artery     with CABG 3/12  -MAZE procedure   . Hypertrophy of prostate with urinary obstruction and other lower urinary tract symptoms (LUTS)   .  Impotence of organic origin   . Unspecified constipation   . Atrial fibrillation     hx of  . Heart attack 2012  . Anginal pain     once in a while, none recent  . Sleep apnea     no CPAP  . Asthma     exercise induced  . Skin cancer     Hx of squamous cell x2  . Basal cell carcinoma of skin of lip   . Jaundice age 63  . Essential hypertension, benign   . Pulmonary embolism     3/12  . 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 Anxiety disorder   . Helicobacter pylori (H. pylori)     Past Surgical History  Procedure Laterality Date  . Hernia repair  2010    bilateral  . Coronary artery bypass graft  Febuary 13, 2012    full maze   . Scrotum exploration  1990's    multiple  . Mohs surgery      squamus x2  . Vasectomy  04/1992  . Superficial  skin cystectomy (l) hip Right 1997    dr [name]  . Hydrocele excision      dr [name]   . Spermatocelectomy  02/1999    Right, Dr.[name]  . Cholecystectomy N/A 11/18/2013    Procedure: LAPAROSCOPIC CHOLECYSTECTOMY With IOC;  Surgeon: Odis Hollingshead, MD;  Location: [facility];  Service: General;  Laterality: N/A;    Social History:   reports that he quit smoking about 37 years ago. His smoking use included Cigarettes. He has a 10.5 pack-year smoking history. He has never used smokeless tobacco. He reports that he drinks alcohol. He reports that he does not use illicit drugs.  Family History  Problem Relation Age of Onset  . Bone cancer Mother   . Diabetes Mother   . Colon cancer Neg Hx   . Esophageal cancer Neg Hx   . Rectal cancer Neg Hx   . Stomach cancer Neg Hx     Medications: Patient's Medications  New Prescriptions   No medications on file  Previous Medications   ALBUTEROL (PROVENTIL HFA;VENTOLIN HFA) 108 (90 BASE) MCG/ACT INHALER    Inhale 2 puffs into the lungs every 6 (six) hours as needed for wheezing.   ALPRAZOLAM (XANAX) 0.5 MG TABLET    1-2 by mouth daily to help anxiety   AMITRIPTYLINE (ELAVIL) 25 MG TABLET    Take 6.25 mg by mouth at bedtime. Takes 1/4 tablet   ASPIRIN 81 MG TABLET    Take 81 mg by mouth 3 (three) times a week. Mondays, wednesdays and fridays   CHOLECALCIFEROL (VITAMIN D) 1000 UNITS TABLET    Take 1,000 Units by mouth daily.   CYCLOBENZAPRINE (FLEXERIL) 10 MG TABLET    Take 2.5 mg by mouth 3 (three) times daily as needed for muscle spasms. Takes 1/4 tablet when needed for spasms   DIAZEPAM (VALIUM) 5 MG TABLET    Take one tablet by mouth once daily to help rest   DICLOFENAC (VOLTAREN) 75 MG EC TABLET    Take 1 tablet (75 mg total) by mouth every morning.   DICYCLOMINE (BENTYL) 10 MG CAPSULE    Take one po BID   FLAXSEED, LINSEED, 1000 MG CAPS    Take 1 capsule by mouth daily.   OMEPRAZOLE (PRILOSEC) 20 MG CAPSULE    Take 1 capsule (20 mg total) by mouth  daily.   ROSUVASTATIN (CRESTOR) 10 MG TABLET  Take 1 tablet (10 mg total) by mouth daily.   UNDECYLENIC ACID (FUNGI-NAIL) 25 % SOLN    Apply daily to affected toenail  Modified Medications   No medications on file  Discontinued Medications   No medications on file     Physical Exam: Filed Vitals:   04/12/15 1522  BP: 102/58  Pulse: 81  Temp: 98.4 F (36.9 C)  TempSrc: Oral  Resp: 18  Height: 5' 6.5" (1.689 m)  Weight: 191 lb 6.4 oz (86.818 kg)  SpO2: 96%  Physical Exam  Constitutional: He is oriented to person, place, and time.  HENT:  Head: Normocephalic and atraumatic.  Right Ear: External ear normal.  Left Ear: External ear normal.  Mouth/Throat: Oropharynx is clear and moist. No oropharyngeal exudate.  Nasal congestion, clear mucus  Eyes: EOM are normal. Pupils are equal, round, and reactive to light.  Watery eyes  Neck: Neck supple. No JVD present.  Cardiovascular: Normal rate, regular rhythm and normal heart sounds.   Pulmonary/Chest: Effort normal.  Coarse rhonchi and end expiratory wheezes anterior upper chest, otherwise clear  Musculoskeletal: He exhibits tenderness.  Mild swelling of right lateral foot and some darkening of skin of dorsolateral aspect  Neurological: He is alert and oriented to person, place, and time.  Psychiatric: He has a normal mood and affect.    Labs reviewed: Basic Metabolic Panel:  Recent Labs  09/12/14 1118 10/17/14 1014 01/23/15 1443  NA 138 140 138  K 5.0 5.0 4.5  CL 101 97 104  CO2 25 29 27   GLUCOSE 93 98 110*  BUN 16 17 23   CREATININE 1.2 1.29* 0.95  CALCIUM 9.6 9.5 9.2  TSH 2.20 3.340  --    Liver Function Tests:  Recent Labs  04/20/14 0340 09/12/14 1118 10/17/14 1014 02/26/15 0901  AST 22 27 25   --   ALT 17 20 19 30   ALKPHOS 52 53 55  --   BILITOT 1.0 1.7* 1.0  --   PROT 6.6 7.6 6.8  --   ALBUMIN 4.0 3.8  --   --     Recent Labs  09/12/14 1118  LIPASE 22.0  AMYLASE 35   No results for  input(s): AMMONIA in the last 8760 hours. CBC:  Recent Labs  09/12/14 1118 10/17/14 1014 01/23/15 1443  WBC 6.9 7.6 7.1  NEUTROABS 4.9 4.7 5.1  HGB 17.3* 17.0 16.0  HCT 50.6 48.3 47.2  MCV 93.3 92 93.8  PLT 260.0  --  224   Lipid Panel:  Recent Labs  10/17/14 1014 02/26/15 0901  CHOL 189 151  HDL 57 51.10  LDLCALC 115* 82  TRIG 84 92.0  CHOLHDL 3.3 3   Lab Results  Component Value Date   HGBA1C * 01/17/2011    6.0 (NOTE)                                                                       According to the ADA Clinical Practice Recommendations for 2011, when HbA1c is used as a screening test:   >=6.5%   Diagnostic of Diabetes Mellitus           (if abnormal result  is confirmed)  5.7-6.4%   Increased risk of developing Diabetes Mellitus  References:Diagnosis and Classification of Diabetes Mellitus,Diabetes ZDGU,4403,47(QQVZD 1):S62-S69 and Standards of Medical Care in         Diabetes - 2011,Diabetes GLOV,5643,32  (Suppl 1):S11-S61.    Past Procedures: Reviewed MRI right ankle  Assessment/Plan 1. Acute bronchitis, unspecified organism - given instructions for conservative mgt with albuterol inhaler use at least 2x per day, warm humidity, mucinex bid, and take zpak if temp increases over 100 or he develops green sputum and worsening of cough and congestion or not better by sunday (tried to avoid giving b/c of his c diff history, but wanted some to use if he needed it) - azithromycin (ZITHROMAX Z-PAK) 250 MG tablet; Take two tabs first day, then one tab daily for 4 days  Dispense: 6 each; Refill: 0  2. Tendinitis of right foot Reviewed mri results with patient--he has an appointment with Dr. Doran Durand to review these and for treatment -recommended heat in the meantime since this is now a chronic injury, elevate foot at rest  3. History of Clostridium difficile -try to avoid using abx as much as possible, and use probiotic with abx and a few days after complete     Labs/tests ordered:  No new Next appt:  Keep regular visit, return prn if not improving  Lakiesha Ralphs L. Keishla Oyer, D.O. [facility] [address], [place] [address] Cell Phone (Mon-Fri 8am-5pm):  [phone] On Call:  [phone] & follow prompts after 5pm & weekends Office Phone:  [phone] Office Fax:  [phone]

## 2015-04-14 ENCOUNTER — Ambulatory Visit (INDEPENDENT_AMBULATORY_CARE_PROVIDER_SITE_OTHER): Payer: Medicare Other

## 2015-04-14 ENCOUNTER — Ambulatory Visit (INDEPENDENT_AMBULATORY_CARE_PROVIDER_SITE_OTHER): Payer: Medicare Other | Admitting: Emergency Medicine

## 2015-04-14 VITALS — BP 118/62 | HR 80 | Temp 98.1°F | Resp 20 | Ht 70.5 in | Wt 191.2 lb

## 2015-04-14 DIAGNOSIS — R059 Cough, unspecified: Secondary | ICD-10-CM

## 2015-04-14 DIAGNOSIS — R05 Cough: Secondary | ICD-10-CM | POA: Diagnosis not present

## 2015-04-14 DIAGNOSIS — R062 Wheezing: Secondary | ICD-10-CM

## 2015-04-14 LAB — POCT CBC
Granulocyte percent: 77.3 %G (ref 37–80)
HEMATOCRIT: 48 % (ref 43.5–53.7)
HEMOGLOBIN: 15.8 g/dL (ref 14.1–18.1)
LYMPH, POC: 1.2 (ref 0.6–3.4)
MCH, POC: 30.8 pg (ref 27–31.2)
MCHC: 33 g/dL (ref 31.8–35.4)
MCV: 93.3 fL (ref 80–97)
MID (cbc): 0.3 (ref 0–0.9)
MPV: 6.5 fL (ref 0–99.8)
POC Granulocyte: 5.3 (ref 2–6.9)
POC LYMPH PERCENT: 18.2 %L (ref 10–50)
POC MID %: 4.5 % (ref 0–12)
Platelet Count, POC: 226 10*3/uL (ref 142–424)
RBC: 5.14 M/uL (ref 4.69–6.13)
RDW, POC: 13.9 %
WBC: 6.8 10*3/uL (ref 4.6–10.2)

## 2015-04-14 LAB — POCT INFLUENZA A/B
INFLUENZA B, POC: NEGATIVE
Influenza A, POC: NEGATIVE

## 2015-04-14 NOTE — Progress Notes (Addendum)
Subjective:  This chart was scribed for [name], MD by [facility], medical scribe at [facility].The patient was seen in exam room 12 and the patient's care was started at 10:16 AM.   Patient ID: Alexander [name], male    DOB: 03-19-1936, 79 y.o.   MRN: [id number] Chief Complaint  Patient presents with  . Follow-up    requesting CXR, to ensure he does not have PNA, was DX with bronchitis on Thursday at PCP,   . Cough    green yellow phlegm since Wednesday   HPI HPI Comments: Alexander [name] is a 79 y.o. male who presents to [facility] requesting a chest x-ray for possible bronchitis. Roughly three day ago he acutely developed a productive cough with a green/yellow phlegm, rhinorrhea, watery red eyes, chest and nasal congestion. He was seen the next day by his PCP and diagnosed with bronchitis but not started on antibiotics, he was written a prescription for Z-pak but told not to fill it until Sunday. Pt does not smoke, he has had the flu and a pneumonia vaccine. Pt has had open heart surgery. Pt states he has taken 232 antibiotic in 7 months after being diagnosed with H. Pylori.  Review of Systems  HENT: Positive for congestion and rhinorrhea.   Eyes: Positive for discharge and redness.  Respiratory: Positive for cough.       Objective:  BP 118/62 mmHg  Pulse 80  Temp(Src) 98.1 F (36.7 C) (Oral)  Resp 20  Ht 5' 10.5" (1.791 m)  Wt 191 lb 3.2 oz (86.728 kg)  BMI 27.04 kg/m2  SpO2 97% Physical Exam  Nursing note and vitals reviewed. CONSTITUTIONAL: Well developed/well nourished HEAD: Normocephalic/atraumatic EYES: EOMI/PERRL, conjunctival redness ENMT: Mucous membranes moist, throat is mildly red NECK: supple no meningeal signs SPINE/BACK:entire spine nontender CV: S1/S2 noted, no murmurs/rubs/gallops noted, sternotomy scar, but regular rate and rhythm LUNGS: Lungs are clear to auscultation bilaterally, no apparent distress right  rhonchi and occasional wheeze no dullness ABDOMEN: soft, nontender, no rebound or guarding, bowel sounds noted throughout abdomen GU:no cva tenderness NEURO: Pt is awake/alert/appropriate, moves all extremitiesx4.  No facial droop.   EXTREMITIES: pulses normal/equal, full ROM SKIN: warm, color normal PSYCH: no abnormalities of mood noted, alert and oriented to situation    Results for orders placed or performed in visit on 04/14/15  POCT CBC  Result Value Ref Range   WBC 6.8 4.6 - 10.2 K/uL   Lymph, poc 1.2 0.6 - 3.4   POC LYMPH PERCENT 18.2 10 - 50 %L   MID (cbc) 0.3 0 - 0.9   POC MID % 4.5 0 - 12 %M   POC Granulocyte 5.3 2 - 6.9   Granulocyte percent 77.3 37 - 80 %G   RBC 5.14 4.69 - 6.13 M/uL   Hemoglobin 15.8 14.1 - 18.1 g/dL   HCT, POC 48.0 43.5 - 53.7 %   MCV 93.3 80 - 97 fL   MCH, POC 30.8 27 - 31.2 pg   MCHC 33.0 31.8 - 35.4 g/dL   RDW, POC 13.9 %   Platelet Count, POC 226 142 - 424 K/uL   MPV 6.5 0 - 99.8 fL  POCT Influenza A/B  Result Value Ref Range   Influenza A, POC Negative    Influenza B, POC Negative   [facility] reading (PRIMARY) by  Dr. Everlene Farrier mild increased markings in the bases. Status post coronary artery bypass graft. No orders  of the defined types were placed in this encounter.   Assessment & Plan:  1. Cough  - POCT CBC - POCT Influenza A/B - DG Chest 2 View; Future I decided to hold off on antibiotics with the patient having no pneumonia on chest x-ray and a white count of 6800 as well as being afebrile here    2.. Wheezing no change in current medications.    I personally performed the services described in this documentation, which was scribed in my presence. The recorded information has been reviewed and is accurate.  Arlyss Queen, MD  [facility]  04/14/2015 11:46 AM

## 2015-04-16 ENCOUNTER — Telehealth: Payer: Self-pay | Admitting: Internal Medicine

## 2015-04-16 NOTE — Telephone Encounter (Signed)
Patient wants to have c.diff test repeated. He will come in for lab kit.

## 2015-04-17 ENCOUNTER — Other Ambulatory Visit: Payer: Medicare Other

## 2015-04-17 DIAGNOSIS — R197 Diarrhea, unspecified: Secondary | ICD-10-CM

## 2015-04-18 LAB — CLOSTRIDIUM DIFFICILE BY PCR: Toxigenic C. Difficile by PCR: NOT DETECTED

## 2015-04-22 ENCOUNTER — Ambulatory Visit (INDEPENDENT_AMBULATORY_CARE_PROVIDER_SITE_OTHER): Payer: Medicare Other

## 2015-04-22 ENCOUNTER — Ambulatory Visit (INDEPENDENT_AMBULATORY_CARE_PROVIDER_SITE_OTHER): Payer: Medicare Other | Admitting: Emergency Medicine

## 2015-04-22 VITALS — BP 107/67 | HR 68 | Temp 97.6°F | Resp 12 | Ht 70.5 in | Wt 188.5 lb

## 2015-04-22 DIAGNOSIS — R059 Cough, unspecified: Secondary | ICD-10-CM

## 2015-04-22 DIAGNOSIS — R05 Cough: Secondary | ICD-10-CM

## 2015-04-22 DIAGNOSIS — R062 Wheezing: Secondary | ICD-10-CM

## 2015-04-22 LAB — POCT CBC
GRANULOCYTE PERCENT: 58.7 % (ref 37–80)
HEMATOCRIT: 50.6 % (ref 43.5–53.7)
HEMOGLOBIN: 16.6 g/dL (ref 14.1–18.1)
Lymph, poc: 2.7 (ref 0.6–3.4)
MCH, POC: 30.3 pg (ref 27–31.2)
MCHC: 32.9 g/dL (ref 31.8–35.4)
MCV: 92.1 fL (ref 80–97)
MID (cbc): 0.5 (ref 0–0.9)
MPV: 5.6 fL (ref 0–99.8)
POC GRANULOCYTE: 4.5 (ref 2–6.9)
POC LYMPH PERCENT: 35.3 %L (ref 10–50)
POC MID %: 6 %M (ref 0–12)
Platelet Count, POC: 395 10*3/uL (ref 142–424)
RBC: 5.5 M/uL (ref 4.69–6.13)
RDW, POC: 13.4 %
WBC: 7.6 10*3/uL (ref 4.6–10.2)

## 2015-04-22 MED ORDER — MONTELUKAST SODIUM 10 MG PO TABS: 10.0000 mg | ORAL_TABLET | Freq: Every day | ORAL | Status: DC

## 2015-04-22 MED ORDER — PREDNISONE 10 MG PO TABS: ORAL_TABLET | ORAL | Status: DC

## 2015-04-22 NOTE — Progress Notes (Addendum)
   Subjective:  This chart was scribed for Arlyss Queen, MD by Moises Blood, Medical Scribe. This patient was seen in Room 11 and the patient's care was started 4:18 PM.    Patient ID: Alexander [name], male    DOB: February 10, 1936, 79 y.o.   MRN: [id number]  HPI Alexander [name] is a 79 y.o. male who has a history of exercise induced asthma presents to [facility] for follow up on gradual onset coughs that started 11 days ago. Pt states that he's feeling better overall. He notes feeling fatigue, and still wheezing. He reports congestion has eased up, and coughs have diminished but productive with clear phlegm. He believes it was the flu even though he had the flu shot. He doesn't take albuterol because it increases his heart rate. He tried Singulair in the past for his breathing problems. He denies chest pains.    He had a CABG back in 2012.  He hasn't had atrial fib for 4 years.     Review of Systems  Constitutional: Positive for fatigue.  HENT: Positive for congestion.   Respiratory: Positive for cough and wheezing.   Cardiovascular: Negative for chest pain.       Objective:   Physical Exam CONSTITUTIONAL: Well developed/Wel nourished HEAD: Normocephalic/atraumatic EYES: EOMI/PERRL ENMT: Mucous membranes moist NECK: supple no meningeal signs SPINE/BACK: entire spine nontender CV: S1/S2 noted, no murmurs/rubs/gallops noted LUNGS: no apparent distress; breathes symmetrical, bilateral expiratory wheezes  ABDOMEN: soft, non tender, no rebound or guarding, bowel sounds noted throughout abdomen; sternotomy scar GU: no cva tenderness NEURO: Pt is awake/alert/appropriate, moves all extremities x4. No facial droop. EXTREMITIES: pulses normal/equal, full ROM SKIN: warm, color normal PSYCH: no abnormalities of mood noted, alert, and oriented to situation [facility] reading (PRIMARY) by  Dr.England Greb no acute disease Meds ordered this encounter  Medications  . predniSONE (DELTASONE) 10 MG tablet    Sig: Take  4 day for 3 days 3 a day for 3 days 2 a day for 3 days one a day for 3 days    Dispense:  30 tablet    Refill:  0  . montelukast (SINGULAIR) 10 MG tablet    Sig: Take 1 tablet (10 mg total) by mouth at bedtime.    Dispense:  30 tablet    Refill:  3        Assessment  & Plan:  Patient does not tolerate albuterol due to tachycardia and triggering atrial fibrillation. He has had issues with C. difficile recent test negative. We'll try another round of prednisone and see if we can get his bronchospasm to stop.I ambulated the patient in the hall and his oxygen level stayed at 98%. He had no shortness of breath. I did not put the patient on antibiotics because of the C. difficile history and will treat with Singulair and prednisone.I personally performed the services described in this documentation, which was scribed in my presence. The recorded information has been reviewed and is accurate.  [name], MD

## 2015-04-25 ENCOUNTER — Ambulatory Visit: Payer: Medicare Other | Admitting: Internal Medicine

## 2015-04-25 DIAGNOSIS — M7751 Other enthesopathy of right foot: Secondary | ICD-10-CM | POA: Diagnosis not present

## 2015-05-01 ENCOUNTER — Encounter: Payer: Self-pay | Admitting: Internal Medicine

## 2015-05-01 ENCOUNTER — Ambulatory Visit (INDEPENDENT_AMBULATORY_CARE_PROVIDER_SITE_OTHER): Payer: Medicare Other | Admitting: Internal Medicine

## 2015-05-01 VITALS — BP 122/68 | HR 70 | Temp 97.5°F | Resp 18 | Ht 70.5 in | Wt 191.6 lb

## 2015-05-01 DIAGNOSIS — S8391XD Sprain of unspecified site of right knee, subsequent encounter: Secondary | ICD-10-CM

## 2015-05-01 DIAGNOSIS — I10 Essential (primary) hypertension: Secondary | ICD-10-CM

## 2015-05-01 DIAGNOSIS — I25701 Atherosclerosis of coronary artery bypass graft(s), unspecified, with angina pectoris with documented spasm: Secondary | ICD-10-CM

## 2015-05-01 DIAGNOSIS — E785 Hyperlipidemia, unspecified: Secondary | ICD-10-CM

## 2015-05-01 DIAGNOSIS — F411 Generalized anxiety disorder: Secondary | ICD-10-CM | POA: Diagnosis not present

## 2015-05-01 DIAGNOSIS — J209 Acute bronchitis, unspecified: Secondary | ICD-10-CM

## 2015-05-01 NOTE — Progress Notes (Signed)
Patient ID: Alexander [name], male   DOB: [birth year], 79 y.o.   MRN: [id number]    Facility  [facility]    Place of Service:   OFFICE    Allergies  Allergen Reactions  . Heparin     Blood clots   . Aspirin     Breathing complications, able to take three times a week  . Codeine     Some is ok  . Oxycodone     Can not take percocet, palpitations  . Zetia [Ezetimibe]     jittery  . Zocor [Simvastatin]     Liver enzymes increased  . Plavix [Clopidogrel Bisulfate] Palpitations    Chief Complaint  Patient presents with  . Medical Management of Chronic Issues    HPI:  Acute bronchitis, unspecified organism: Patient feels that he has recovered from this now. He was seen in the urgent care center in [place] twice. Although he enters the office saying he had the flu, titers for influenza A and B were negative when checked by Dr. Everlene Farrier.  Essential hypertension - controlled  Sprain of right lower leg, subsequent encounter: Essentially same as on the last visit. Bruising has resolved. He has seen an orthopedist. He is not wearing any splints or wraps. She is aware that this is likely to be a weak area in the future, subject to reinjury.  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 Continue to monitor.  Anxiety state continues to benefit from diazepam for muscle relaxation as well as improvement in anxiety. He has questions about clonazepam, but we decided not to use this drug because of its shorter half-life as compared to the diazepam.    Medications: Patient's Medications  New Prescriptions   No medications on file  Previous Medications   ALBUTEROL (PROVENTIL HFA;VENTOLIN HFA) 108 (90 BASE) MCG/ACT INHALER    Inhale 2 puffs into the lungs every 6 (six) hours as needed for wheezing.   ALPRAZOLAM (XANAX) 0.5 MG TABLET    1-2 by mouth daily to help  anxiety   AMITRIPTYLINE (ELAVIL) 25 MG TABLET    Take 6.25 mg by mouth at bedtime. Takes 1/4 tablet   ASPIRIN 81 MG TABLET    Take 81 mg by mouth 3 (three) times a week. Mondays, wednesdays and fridays   CHOLECALCIFEROL (VITAMIN D) 1000 UNITS TABLET    Take 1,000 Units by mouth daily.   DIAZEPAM (VALIUM) 5 MG TABLET    Take one tablet by mouth once daily to help rest   DICYCLOMINE (BENTYL) 10 MG CAPSULE    Take one po BID   FLAXSEED, LINSEED, 1000 MG CAPS    Take 1 capsule by mouth daily.   MONTELUKAST (SINGULAIR) 10 MG TABLET    Take 1 tablet (10 mg total) by mouth at bedtime.   OMEPRAZOLE (PRILOSEC) 20 MG CAPSULE    Take 1 capsule (20 mg total) by mouth daily.   PREDNISONE (DELTASONE) 10 MG TABLET    Take 4 day for 3 days 3 a day for 3 days 2 a day for 3 days one a day for 3 days   ROSUVASTATIN (CRESTOR) 10 MG TABLET    Take 1 tablet (10 mg total) by mouth daily.   UNDECYLENIC ACID (FUNGI-NAIL) 25 % SOLN    Apply daily to affected toenail  Modified Medications   No medications on file  Discontinued Medications  No medications on file     Review of Systems  Constitutional: Negative for fever, chills, activity change, appetite change, fatigue and unexpected weight change.  HENT: Positive for hearing loss. Negative for congestion and ear pain.   Eyes: Negative.   Respiratory: Negative.  Negative for choking, chest tightness and shortness of breath.   Cardiovascular: Negative.  Negative for chest pain, palpitations and leg swelling.  Gastrointestinal: Negative for nausea and diarrhea.       RUQ discomfort. Had cholecystectomy in Dec 2014.  Endocrine: Negative.   Genitourinary: Negative.   Musculoskeletal: Negative.   Skin:       Tender at right leg laterally and of the right foot since Feb 2016.Marland Kitchen  Allergic/Immunologic: Negative.   Neurological: Negative.   Hematological: Negative.   Psychiatric/Behavioral: Negative.     Filed Vitals:   05/01/15 1617  BP: 122/68  Pulse: 70    Temp: 97.5 F (36.4 C)  TempSrc: Oral  Resp: 18  Height: 5' 10.5" (1.791 m)  Weight: 191 lb 9.6 oz (86.909 kg)  SpO2: 98%   Body mass index is 27.09 kg/(m^2).  Physical Exam  Constitutional: He appears well-developed and well-nourished. No distress.  HENT:  Head: Normocephalic and atraumatic.  Right Ear: External ear normal.  Left Ear: External ear normal.  Nose: Nose normal.  Mouth/Throat: Oropharynx is clear and moist.  Dentures. Missing some teeth.  Eyes: Conjunctivae and EOM are normal. Pupils are equal, round, and reactive to light.  Neck: No JVD present. No tracheal deviation present. No thyromegaly present.  Cardiovascular: Normal rate, regular rhythm, normal heart sounds and intact distal pulses.  Exam reveals no gallop and no friction rub.   No murmur heard. Pulmonary/Chest: No respiratory distress. He has no wheezes. He has no rales. He exhibits no tenderness.  Abdominal: He exhibits no distension and no mass. There is no tenderness. There is no rebound and no guarding. No hernia.  post laparoscopic cholecystectomy scars.   Genitourinary:  History of enlarged prostate  Musculoskeletal: He exhibits tenderness (anterior to the right lateral malleolus). He exhibits no edema.  Neurological: He displays normal reflexes. No cranial nerve deficit. Coordination normal.  Skin: No rash noted. No erythema. No pallor.  Psychiatric: He has a normal mood and affect. His behavior is normal. Judgment and thought content normal.  Nursing note and vitals reviewed.    Labs reviewed: Office Visit on 04/22/2015  Component Date Value Ref Range Status  . WBC 04/22/2015 7.6  4.6 - 10.2 K/uL Final  . Lymph, poc 04/22/2015 2.7  0.6 - 3.4 Final  . POC LYMPH PERCENT 04/22/2015 35.3  10 - 50 %L Final  . MID (cbc) 04/22/2015 0.5  0 - 0.9 Final  . POC MID % 04/22/2015 6.0  0 - 12 %M Final  . POC Granulocyte 04/22/2015 4.5  2 - 6.9 Final  . Granulocyte percent 04/22/2015 58.7  37 - 80 %G  Final  . RBC 04/22/2015 5.50  4.69 - 6.13 M/uL Final  . Hemoglobin 04/22/2015 16.6  14.1 - 18.1 g/dL Final  . HCT, POC 04/22/2015 50.6  43.5 - 53.7 % Final  . MCV 04/22/2015 92.1  80 - 97 fL Final  . MCH, POC 04/22/2015 30.3  27 - 31.2 pg Final  . MCHC 04/22/2015 32.9  31.8 - 35.4 g/dL Final  . RDW, POC 04/22/2015 13.4   Final  . Platelet Count, POC 04/22/2015 395  142 - 424 K/uL Final  . MPV 04/22/2015 5.6  0 -  99.8 fL Final  Appointment on 04/17/2015  Component Date Value Ref Range Status  . C difficile by pcr 04/17/2015 Not Detected  Not Detected Final   Comment: This test is for use only with liquid or soft stools; performance characteristics of other clinical specimen types have not been established.   This assay was performed by Cepheid GeneXpert(R) PCR. The performance characteristics of this assay have been determined by [employer]. Performance characteristics refer to the analytical performance of the test.   Office Visit on 04/14/2015  Component Date Value Ref Range Status  . WBC 04/14/2015 6.8  4.6 - 10.2 K/uL Final  . Lymph, poc 04/14/2015 1.2  0.6 - 3.4 Final  . POC LYMPH PERCENT 04/14/2015 18.2  10 - 50 %L Final  . MID (cbc) 04/14/2015 0.3  0 - 0.9 Final  . POC MID % 04/14/2015 4.5  0 - 12 %M Final  . POC Granulocyte 04/14/2015 5.3  2 - 6.9 Final  . Granulocyte percent 04/14/2015 77.3  37 - 80 %G Final  . RBC 04/14/2015 5.14  4.69 - 6.13 M/uL Final  . Hemoglobin 04/14/2015 15.8  14.1 - 18.1 g/dL Final  . HCT, POC 04/14/2015 48.0  43.5 - 53.7 % Final  . MCV 04/14/2015 93.3  80 - 97 fL Final  . MCH, POC 04/14/2015 30.8  27 - 31.2 pg Final  . MCHC 04/14/2015 33.0  31.8 - 35.4 g/dL Final  . RDW, POC 04/14/2015 13.9   Final  . Platelet Count, POC 04/14/2015 226  142 - 424 K/uL Final  . MPV 04/14/2015 6.5  0 - 99.8 fL Final  . Influenza A, POC 04/14/2015 Negative   Final  . Influenza B, POC 04/14/2015 Negative   Final  Lab on 02/26/2015  Component Date  Value Ref Range Status  . Cholesterol 02/26/2015 151  0 - 200 mg/dL Final   ATP III Classification       Desirable:  < 200 mg/dL               Borderline High:  200 - 239 mg/dL          High:  > = 240 mg/dL  . Triglycerides 02/26/2015 92.0  0.0 - 149.0 mg/dL Final   Normal:  <150 mg/dLBorderline High:  150 - 199 mg/dL  . HDL 02/26/2015 51.10  >39.00 mg/dL Final  . VLDL 02/26/2015 18.4  0.0 - 40.0 mg/dL Final  . LDL Cholesterol 02/26/2015 82  0 - 99 mg/dL Final  . Total CHOL/HDL Ratio 02/26/2015 3   Final                  Men          Women1/2 Average Risk     3.4          3.3Average Risk          5.0          4.42X Average Risk          9.6          7.13X Average Risk          15.0          11.0                      . NonHDL 02/26/2015 99.90   Final   NOTE:  Non-HDL goal should be 30 mg/dL higher than patient's LDL goal (i.e. LDL goal of < 70  mg/dL, would have non-HDL goal of < 100 mg/dL)  . ALT 02/26/2015 30  0 - 53 U/L Final  Appointment on 02/14/2015  Component Date Value Ref Range Status  . C difficile by pcr 02/14/2015 Detected* Not Detected Final   Comment: This test is for use only with liquid or soft stools; performance characteristics of other clinical specimen types have not been established.   This assay was performed by Cepheid GeneXpert(R) PCR. The performance characteristics of this assay have been determined by [employer]. Performance characteristics refer to the analytical performance of the test.   . Lactoferrin 02/14/2015 NEGATIVE   Final     Assessment/Plan  1. Acute bronchitis, unspecified organism Resolved  2. Essential hypertension Controlled on current medication - Comprehensive metabolic panel; Future  3. Sprain of right lower leg, subsequent encounter Stable mild tenderness that persists.  4. 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 Hyperlipidemia -  Lipid panel; Future  6. Anxiety state Continue diazepam

## 2015-05-08 DIAGNOSIS — H04123 Dry eye syndrome of bilateral lacrimal glands: Secondary | ICD-10-CM | POA: Diagnosis not present

## 2015-06-14 DIAGNOSIS — L738 Other specified follicular disorders: Secondary | ICD-10-CM | POA: Diagnosis not present

## 2015-06-14 DIAGNOSIS — L821 Other seborrheic keratosis: Secondary | ICD-10-CM | POA: Diagnosis not present

## 2015-07-04 DIAGNOSIS — J342 Deviated nasal septum: Secondary | ICD-10-CM | POA: Diagnosis not present

## 2015-07-04 DIAGNOSIS — R490 Dysphonia: Secondary | ICD-10-CM | POA: Diagnosis not present

## 2015-07-24 ENCOUNTER — Telehealth: Payer: Self-pay

## 2015-07-24 NOTE — Telephone Encounter (Signed)
Patient left a message today at 12:42pm asking for a call back. Did not leave any details. Cb# [phone].

## 2015-07-26 NOTE — Telephone Encounter (Signed)
Spoke with patient. He was seen in May by Dr. Everlene Farrier and had 2 CXR's done and needs them on a disc, along with x-ray reports. Nodules were found in his lung and he is going for a second opinion and needs to take x-rays with him. Will print x-ray reports and forward request to x-ray dept for disc. Patient will sign ROI form when he comes by today between 1:30 and 2pm.

## 2015-07-26 NOTE — Telephone Encounter (Signed)
X-ray reports and disc in 102 pick up drawer.

## 2015-07-27 DIAGNOSIS — R918 Other nonspecific abnormal finding of lung field: Secondary | ICD-10-CM | POA: Diagnosis not present

## 2015-07-27 DIAGNOSIS — Z87891 Personal history of nicotine dependence: Secondary | ICD-10-CM | POA: Diagnosis not present

## 2015-07-27 DIAGNOSIS — Z86711 Personal history of pulmonary embolism: Secondary | ICD-10-CM | POA: Diagnosis not present

## 2015-07-27 DIAGNOSIS — R911 Solitary pulmonary nodule: Secondary | ICD-10-CM | POA: Diagnosis not present

## 2015-07-27 DIAGNOSIS — Z951 Presence of aortocoronary bypass graft: Secondary | ICD-10-CM | POA: Diagnosis not present

## 2015-07-27 DIAGNOSIS — Z7982 Long term (current) use of aspirin: Secondary | ICD-10-CM | POA: Diagnosis not present

## 2015-08-03 ENCOUNTER — Telehealth: Payer: Self-pay | Admitting: Internal Medicine

## 2015-08-03 NOTE — Telephone Encounter (Signed)
Spoke with patient he is having a "grumpy stomach and abdominal pain" for last 2 weeks. He is taking Omeprazole daily. He is concerned he is having gastritis or h. Pylori is back. Scheduled with Lori Hvozdovic, PA-C  On 08/06/15 at 1:15 PM.

## 2015-08-06 ENCOUNTER — Encounter: Payer: Self-pay | Admitting: Physician Assistant

## 2015-08-06 ENCOUNTER — Ambulatory Visit (INDEPENDENT_AMBULATORY_CARE_PROVIDER_SITE_OTHER): Payer: Medicare Other | Admitting: Physician Assistant

## 2015-08-06 VITALS — BP 118/56 | HR 72 | Ht 68.5 in | Wt 193.5 lb

## 2015-08-06 DIAGNOSIS — R1013 Epigastric pain: Secondary | ICD-10-CM

## 2015-08-06 DIAGNOSIS — R140 Abdominal distension (gaseous): Secondary | ICD-10-CM | POA: Diagnosis not present

## 2015-08-06 DIAGNOSIS — K589 Irritable bowel syndrome without diarrhea: Secondary | ICD-10-CM

## 2015-08-06 MED ORDER — SUCRALFATE 1 G PO TABS: 1.0000 g | ORAL_TABLET | Freq: Three times a day (TID) | ORAL | Status: DC

## 2015-08-06 NOTE — Patient Instructions (Addendum)
Stop omeprazole x 3 weeks. Then get your labs for H. Pylori stool antigen and pancreatic fecal elastase.   We have sent the following medications to your pharmacy for you to pick up at your convenience:Carafate.   Use your Bentyl/ dicyclomine.   Please follow up with Dr. Havery Moros on 10/05/15 at 1:30pm.

## 2015-08-06 NOTE — Progress Notes (Signed)
Patient ID: IVO MOGA, male   DOB: [birth year], 79 y.o.   MRN: [id number]     History of Present Illness: Alexander [name] is a pleasant 79 year old male previously followed by Dr. [name]. He is a retired [employer], and has a past medical history significant for rheumatic fever, pulmonary embolism, hyperlipidemia, anal fissures, anxiety, diverticulosis, internal hemorrhoids, IBS,  CAD, status post CABG with maze procedure, prostatic hypertrophy, atrial fibrillation, MI, angina, sleep apnea, asthma, hypertension, heparin-induced thrombocytopenia, H. pylori gastritis, and C. difficile diarrhea.  Upper endoscopy in June 2015 showed chronic active gastritis with H. pylori. He was treated for 10 days with combination of Flagyl, Biaxin, and a PPI but subsequent stool antigen testing remains positive. He was then retreated with the same regimen of Flagyl 250 mg 4 times daily, Biaxin 500 mg twice a day, and a PPI for 14 days. A repeat stool antigen for H. pylori 4 weeks later was again positive. He had initially reported an allergy to penicillin but hadn't been able to tolerate amoxicillin in the past and so he was treated with amoxicillin 500 mg 4 times daily, Biaxin 500 mg twice daily, and a PPI twice daily for 14 days. Repeat stool antigen for H. pylori was negative. The patient had also had treatment for pneumonia recently and subsequently developed C. difficile. He was treated with a course of Flagyl and did well for a few weeks and then had recurrent C. difficile for which he was again treated with Flagyl with good resolution.  He is here today with complaints of intermittent abdominal discomfort. He reports that he will frequently gets bloating and rumbling in the epigastric and mid abdominal area in the evening after supper. His bowel movements have been normal. He has had no melena and has not had any bright red blood per rectum. He has been belching a lot but has not had heartburn or regurgitation.  He denies dysphagia. He started using omeprazole several days per week which has somewhat decreased the symptoms but not totally. He is hesitant to take a PPI regularly because he read it can increase his chances of recurrent C. Difficile. He has tried using Bentyl that he has at home for his symptoms, and finds that it does provide relief, but he does not want to use Bentyl regularly because he says it will cause constipation. He has multiple questions regarding the relationship between stress and his IBS. He has been under a lot of stress recently with family illnesses and feels it may be adding to his discomfort. He has amitriptyline at home for his anxiety but states he cuts the tablet into quarters and uses it only very rarely.    Past Medical History  Diagnosis Date  . Pneumonia   . Pulmonary embolism 2012    s/p heart surgery  . Hyperlipidemia   . Dysuria   . Clotting disorder   . Difficulty urinating   . Rheumatic fever age 73  . History of anal fissures   . Anxiety   . Diverticulosis of colon (without mention of hemorrhage)     mild  . Internal hemorrhoids without mention of complication   . Allergic rhinitis due to pollen   . History of cholelithiasis   . Edema   . Nonspecific elevation of levels of transaminase or lactic acid dehydrogenase (LDH)   . Long term (current) use of anticoagulants   . Other voice and resonance disorders     after surgery, took breathing tube out  and damaged something  . Coronary atherosclerosis of native coronary artery     with CABG 3/12  -MAZE procedure   . Hypertrophy of prostate with urinary obstruction and other lower urinary tract symptoms (LUTS)   . Impotence of organic origin   . Unspecified constipation   . Atrial fibrillation     hx of  . Heart attack 2012  . Anginal pain     once in a while, none recent  . Sleep apnea     no CPAP  . Asthma     exercise induced  . Skin cancer     Hx of squamous cell x2  . Basal cell carcinoma of  skin of lip   . Jaundice age 42  . Essential hypertension, benign   . Pulmonary embolism     3/12  . 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 Anxiety disorder   . Helicobacter pylori (H. pylori)     Past Surgical History  Procedure Laterality Date  . Hernia repair  2010    bilateral  . Coronary artery bypass graft  Febuary 13, 2012    full maze   . Scrotum exploration  1990's    multiple  . Mohs surgery      squamus x2  . Vasectomy  04/1992  . Superficial skin cystectomy (l) hip Right 1997    dr [name]  . Hydrocele excision      dr [name]   . Spermatocelectomy  02/1999    Right, Dr.[name]  . Cholecystectomy N/A 11/18/2013    Procedure: LAPAROSCOPIC CHOLECYSTECTOMY With IOC;  Surgeon: Odis Hollingshead, MD;  Location: [facility];  Service: General;  Laterality: N/A;   Family History  Problem Relation Age of Onset  . Bone cancer Mother   . Diabetes Mother   . Colon cancer Neg Hx   . Esophageal cancer Neg Hx   . Rectal cancer Neg Hx   . Stomach cancer Neg Hx    Social History  Substance Use Topics  . Smoking status: Former Smoker -- 0.50 packs/day for 21 years    Types: Cigarettes    Quit date: 12/08/1977  . Smokeless tobacco: Never Used  . Alcohol Use: Yes     Comment: former heavy alcohol use, rare use   Current Outpatient Prescriptions  Medication Sig Dispense Refill  . albuterol (PROVENTIL HFA;VENTOLIN HFA) 108 (90 BASE) MCG/ACT inhaler Inhale 2 puffs into the lungs every 6 (six) hours as needed for wheezing. 1 Inhaler 0  . ALPRAZolam (XANAX) 0.5 MG tablet 1-2 by mouth daily to help anxiety 60 tablet 5  . amitriptyline (ELAVIL) 25 MG tablet Take 6.25 mg by mouth at bedtime. Takes 1/4 tablet    . aspirin 81 MG tablet Take 81 mg by mouth 3 (three) times a week. Mondays, wednesdays and fridays    . cholecalciferol (VITAMIN D) 1000 UNITS tablet Take 1,000 Units by mouth daily.    . diazepam (VALIUM) 5 MG tablet Take one tablet by mouth once daily to help rest 30  tablet 5  . dicyclomine (BENTYL) 10 MG capsule Take one po BID 60 capsule 1  . Flaxseed, Linseed, 1000 MG CAPS Take 1 capsule by mouth daily.    Marland Kitchen omeprazole (PRILOSEC) 20 MG capsule Take 1 capsule (20 mg total) by mouth daily. 90 capsule 3  . rosuvastatin (CRESTOR) 10 MG tablet Take 1 tablet (10 mg total) by mouth daily. 30 tablet 11  . sucralfate (CARAFATE) 1 G tablet  Take 1 tablet (1 g total) by mouth 4 (four) times daily -  with meals and at bedtime. 120 tablet 3  . Undecylenic Acid (FUNGI-NAIL) 25 % SOLN Apply daily to affected toenail 1 Bottle 0   No current facility-administered medications for this visit.   Allergies  Allergen Reactions  . Heparin     Blood clots   . Aspirin     Breathing complications, able to take three times a week  . Codeine     Some is ok  . Oxycodone     Can not take percocet, palpitations  . Zetia [Ezetimibe]     jittery  . Zocor [Simvastatin]     Liver enzymes increased  . Plavix [Clopidogrel Bisulfate] Palpitations     Review of Systems: Gen: Denies any fever, chills, sweats, anorexia, fatigue, weakness, malaise, weight loss, and sleep disorder CV: Denies chest pain, angina, palpitations, syncope, orthopnea, PND, peripheral edema, and claudication. Resp: Denies dyspnea at rest, dyspnea with exercise, cough, sputum, wheezing, coughing up blood, and pleurisy. GI: Denies vomiting blood, jaundice, and fecal incontinence.   Denies dysphagia or odynophagia. GU : Denies urinary burning, blood in urine, urinary frequency, urinary hesitancy, nocturnal urination, and urinary incontinence. MS: Denies joint pain, limitation of movement, and swelling, stiffness, low back pain, extremity pain. Denies muscle weakness, cramps, atrophy.  Derm: Denies rash, itching, dry skin, hives, moles, warts, or unhealing ulcers.  Psych: Denies depression, anxiety, memory loss, suicidal ideation, hallucinations, paranoia, and confusion. Heme: Denies bruising, bleeding, and  enlarged lymph nodes. Neuro:  Denies any headaches, dizziness, paresthesia Endo:  Denies any problems with DM, thyroid, adrenal  Procedures: Upper endoscopy 05/24/2014 showed irregular squamocolumnar junction. Diffuse moderately severe gastritis. No evidence of bile reflux. Biopsies showed chronic active gastritis with H. pylori.  Colonoscopy 06/25/2012 revealed internal hemorrhoids and mild diverticulosis of the sigmoid, otherwise normal examination.   Physical Exam: BP 118/56 mmHg  Pulse 72  Ht 5' 8.5" (1.74 m)  Wt 193 lb 8 oz (87.771 kg)  BMI 28.99 kg/m2 General: Pleasant, well developed , anxious male in no acute distress Head: Normocephalic and atraumatic  Eyes:  sclerae anicteric, conjunctiva pink  Ears: Normal auditory acuity Lungs: Clear throughout to auscultation Heart: Regular rate and rhythm Abdomen: Soft, non distended, non-tender. No masses, no hepatomegaly. Normal bowel sounds Musculoskeletal: Symmetrical with no gross deformities  Extremities: No edema  Neurological: Alert oriented x 4, grossly nonfocal Psychological:  Alert and cooperative. Normal mood and affect  Assessment and Recommendations: 79 year old male with a history of IBS, gastritis, GERD, and diverticulosis, presenting with a 2 to three-week history of postprandial bloating and discomfort. Patient is concerned about recurrent gastritis. He is reluctant to start a PPI on a regular basis due to his prior history of C. difficile. He will discontinue omeprazole altogether. He will be given a trial of Carafate 1 g one by mouth before meals and at bedtime. He will be sent for an H. pylori stool antigen in 3 weeks (see patient requests this be repeated to be sure he does not have recurrent H. pylori) along with a stool fecal elastase. If his elastase is low he will be given a trial of Creon. If his elastase is normal and he is negative for H. pylori, and if he has had no relief with Carafate, he will be considered  for a trial of a PPI on a regular basis. He has reported some relief of his symptoms with Bentyl and he will use  his Bentyl as needed. If this fails to provide relief, he may be considered for a trial of low-dose amitriptyline or nortriptyline on a regular basis for his IBS/dyspeptic symptoms. He will follow up in 2 months, sooner if needed.        Michall Noffke, Deloris Ping 08/06/2015,

## 2015-08-28 ENCOUNTER — Telehealth: Payer: Self-pay | Admitting: Physician Assistant

## 2015-08-28 ENCOUNTER — Other Ambulatory Visit (INDEPENDENT_AMBULATORY_CARE_PROVIDER_SITE_OTHER): Payer: Medicare Other | Admitting: *Deleted

## 2015-08-28 ENCOUNTER — Telehealth: Payer: Self-pay

## 2015-08-28 DIAGNOSIS — E785 Hyperlipidemia, unspecified: Secondary | ICD-10-CM | POA: Diagnosis not present

## 2015-08-28 LAB — LIPID PANEL
CHOL/HDL RATIO: 3
Cholesterol: 162 mg/dL (ref 0–200)
HDL: 52.3 mg/dL (ref >39.00)
LDL CALC: 96 mg/dL (ref 0–99)
NONHDL: 109.31
Triglycerides: 65 mg/dL (ref 0.0–149.0)
VLDL: 13 mg/dL (ref 0.0–40.0)

## 2015-08-28 LAB — ALT: ALT: 20 U/L (ref 0–53)

## 2015-08-28 NOTE — Telephone Encounter (Signed)
-----   Message from Belva Crome, MD sent at 08/28/2015  2:18 PM EDT ----- Lipids are near/at target

## 2015-08-28 NOTE — Telephone Encounter (Signed)
Called to give pt lab results.lmtcb  

## 2015-08-28 NOTE — Telephone Encounter (Signed)
Left a message for patient to call back. 

## 2015-08-28 NOTE — Addendum Note (Signed)
Addended by: Eulis Foster on: 08/28/2015 08:33 AM   Modules accepted: Orders

## 2015-08-28 NOTE — Telephone Encounter (Signed)
Spoke with patient and he wanted to know if you need to be checked for c. Diff at various times if you have had it before. Discuss that stool has to be liquid to have testing. He does not have diarrhea currently was just curious about this. All questions answered.

## 2015-08-29 ENCOUNTER — Other Ambulatory Visit: Payer: Medicare Other

## 2015-08-29 DIAGNOSIS — R140 Abdominal distension (gaseous): Secondary | ICD-10-CM | POA: Diagnosis not present

## 2015-08-29 DIAGNOSIS — R1013 Epigastric pain: Secondary | ICD-10-CM

## 2015-08-29 NOTE — Telephone Encounter (Signed)
Follow Up    Pt is calling to get his lab results   Please call pt

## 2015-08-29 NOTE — Telephone Encounter (Signed)
Notified of lab results.  States he will work harder on diet to get LDL down.

## 2015-08-30 ENCOUNTER — Other Ambulatory Visit: Payer: Self-pay

## 2015-08-30 DIAGNOSIS — E785 Hyperlipidemia, unspecified: Secondary | ICD-10-CM

## 2015-08-31 LAB — HELICOBACTER PYLORI  SPECIAL ANTIGEN: H. PYLORI ANTIGEN STOOL: NOT DETECTED

## 2015-09-04 ENCOUNTER — Encounter: Payer: Self-pay | Admitting: Internal Medicine

## 2015-09-04 ENCOUNTER — Ambulatory Visit (INDEPENDENT_AMBULATORY_CARE_PROVIDER_SITE_OTHER): Payer: Medicare Other | Admitting: Internal Medicine

## 2015-09-04 VITALS — BP 99/70 | HR 70 | Temp 97.7°F | Resp 20 | Ht 68.5 in | Wt 193.4 lb

## 2015-09-04 DIAGNOSIS — R3129 Other microscopic hematuria: Secondary | ICD-10-CM

## 2015-09-04 DIAGNOSIS — S8391XD Sprain of unspecified site of right knee, subsequent encounter: Secondary | ICD-10-CM | POA: Diagnosis not present

## 2015-09-04 DIAGNOSIS — I10 Essential (primary) hypertension: Secondary | ICD-10-CM

## 2015-09-04 DIAGNOSIS — R312 Other microscopic hematuria: Secondary | ICD-10-CM | POA: Diagnosis not present

## 2015-09-04 DIAGNOSIS — R300 Dysuria: Secondary | ICD-10-CM | POA: Diagnosis not present

## 2015-09-04 LAB — POCT URINALYSIS DIPSTICK
Bilirubin, UA: NEGATIVE
Glucose, UA: NEGATIVE
KETONES UA: NEGATIVE
LEUKOCYTES UA: NEGATIVE
NITRITE UA: NEGATIVE
PH UA: 6
PROTEIN UA: NEGATIVE
Spec Grav, UA: 1.01
UROBILINOGEN UA: 0.2

## 2015-09-04 NOTE — Progress Notes (Signed)
Patient ID: Alexander [name], male   DOB: 05/24/1936, 79 y.o.   MRN: [id number]    Facility  [facility]    Place of Service:   OFFICE    Allergies  Allergen Reactions  . Heparin     Blood clots   . Aspirin     Breathing complications, able to take three times a week  . Codeine     Some is ok  . Oxycodone     Can not take percocet, palpitations  . Zetia [Ezetimibe]     jittery  . Zocor [Simvastatin]     Liver enzymes increased  . Plavix [Clopidogrel Bisulfate] Palpitations    Chief Complaint  Patient presents with  . Acute Visit    pain with urination during the middle of night    HPI:  Burning with urination - intermittent symptoms present for several weeks. No GROSS hematuria. No pain with intercourse.  Essential hypertension  - controlled  Hematuria, microscopic  - small amount of blood noted on most recent urine specimen  Sprain of right lower leg, subsequent encounter  - Resolved     Medications: Patient's Medications  New Prescriptions   No medications on file  Previous Medications   ALBUTEROL (PROVENTIL HFA;VENTOLIN HFA) 108 (90 BASE) MCG/ACT INHALER    Inhale 2 puffs into the lungs every 6 (six) hours as needed for wheezing.   ALPRAZOLAM (XANAX) 0.5 MG TABLET    1-2 by mouth daily to help anxiety   AMITRIPTYLINE (ELAVIL) 25 MG TABLET    Take 6.25 mg by mouth at bedtime. Takes 1/4 tablet   ASPIRIN 81 MG TABLET    Take 81 mg by mouth 3 (three) times a week. Mondays, wednesdays and fridays   CHOLECALCIFEROL (VITAMIN D) 1000 UNITS TABLET    Take 1,000 Units by mouth daily.   DIAZEPAM (VALIUM) 5 MG TABLET    Take one tablet by mouth once daily to help rest   DICYCLOMINE (BENTYL) 10 MG CAPSULE    Take one po BID   FLAXSEED, LINSEED, 1000 MG CAPS    Take 1 capsule by mouth daily.   OMEPRAZOLE (PRILOSEC) 20 MG CAPSULE    Take 1 capsule (20 mg total) by mouth daily.   ROSUVASTATIN (CRESTOR) 10 MG TABLET    Take 1 tablet (10 mg total) by mouth daily.   UNDECYLENIC ACID  (FUNGI-NAIL) 25 % SOLN    Apply daily to affected toenail  Modified Medications   No medications on file  Discontinued Medications   SUCRALFATE (CARAFATE) 1 G TABLET    Take 1 tablet (1 g total) by mouth 4 (four) times daily -  with meals and at bedtime.     Review of Systems  Constitutional: Negative for fever, chills, activity change, appetite change, fatigue and unexpected weight change.  HENT: Positive for hearing loss. Negative for congestion and ear pain.   Eyes: Negative.   Respiratory: Negative.  Negative for choking, chest tightness and shortness of breath.   Cardiovascular: Negative.  Negative for chest pain, palpitations and leg swelling.  Gastrointestinal: Negative for nausea and diarrhea.       RUQ discomfort seems resolved. Had cholecystectomy in Dec 2014.  Endocrine: Negative.   Genitourinary: Negative.   Musculoskeletal:       Tender at right leg laterally and of the right foot since Feb 2016.  Skin: Negative.   Allergic/Immunologic: Negative.   Neurological: Negative.   Hematological: Negative.   Psychiatric/Behavioral: Negative.     Filed  Vitals:   09/04/15 1501  BP: 99/70  Pulse: 70  Temp: 97.7 F (36.5 C)  TempSrc: Oral  Resp: 20  Height: 5' 8.5" (1.74 m)  Weight: 193 lb 6.4 oz (87.726 kg)  SpO2: 97%   Body mass index is 28.98 kg/(m^2).  Physical Exam  Constitutional: He appears well-developed and well-nourished. No distress.  HENT:  Head: Normocephalic and atraumatic.  Right Ear: External ear normal.  Left Ear: External ear normal.  Nose: Nose normal.  Mouth/Throat: Oropharynx is clear and moist.  Dentures. Missing some teeth.  Eyes: Conjunctivae and EOM are normal. Pupils are equal, round, and reactive to light.  Neck: No JVD present. No tracheal deviation present. No thyromegaly present.  Cardiovascular: Normal rate, regular rhythm, normal heart sounds and intact distal pulses.  Exam reveals no gallop and no friction rub.   No murmur  heard. Pulmonary/Chest: No respiratory distress. He has no wheezes. He has no rales. He exhibits no tenderness.  Abdominal: He exhibits no distension and no mass. There is no tenderness. There is no rebound and no guarding. No hernia.  post laparoscopic cholecystectomy scars.   Genitourinary:  History of enlarged prostate  Musculoskeletal: He exhibits no edema or tenderness (anterior to the right lateral malleolus has resolved).  Neurological: He displays normal reflexes. No cranial nerve deficit. Coordination normal.  Skin: No rash noted. No erythema. No pallor.  Psychiatric: He has a normal mood and affect. His behavior is normal. Judgment and thought content normal.  Nursing note and vitals reviewed.    Labs reviewed: Lab Summary Latest Ref Rng 08/28/2015 04/22/2015 04/14/2015 02/26/2015 01/23/2015  Hemoglobin 14.1 - 18.1 g/dL (None) 16.6 15.8 (None) 16.0  Hematocrit 43.5 - 53.7 % (None) 50.6 48.0 (None) 47.2  White count 4.6 - 10.2 K/uL (None) 7.6 6.8 (None) 7.1  Platelet count 150 - 400 K/uL (None) (None) (None) (None) 224  Sodium 135 - 145 mmol/L (None) (None) (None) (None) 138  Potassium 3.5 - 5.1 mmol/L (None) (None) (None) (None) 4.5  Calcium 8.4 - 10.5 mg/dL (None) (None) (None) (None) 9.2  Phosphorus - (None) (None) (None) (None) (None)  Creatinine 0.50 - 1.35 mg/dL (None) (None) (None) (None) 0.95  AST - (None) (None) (None) (None) (None)  Alk Phos - (None) (None) (None) (None) (None)  Bilirubin - (None) (None) (None) (None) (None)  Glucose 70 - 99 mg/dL (None) (None) (None) (None) 110(H)  Cholesterol 0 - 200 mg/dL 162 (None) (None) 151 (None)  HDL cholesterol >39.00 mg/dL 52.30 (None) (None) 51.10 (None)  Triglycerides 0.0 - 149.0 mg/dL 65.0 (None) (None) 92.0 (None)  LDL Direct - (None) (None) (None) (None) (None)  LDL Calc 0 - 99 mg/dL 96 (None) (None) 82 (None)  Total protein - (None) (None) (None) (None) (None)  Albumin - (None) (None) (None) (None) (None)   Lab  Results  Component Value Date   TSH 3.340 10/17/2014   Lab Results  Component Value Date   BUN 23 01/23/2015   Lab Results  Component Value Date   HGBA1C * 01/17/2011    6.0 (NOTE)                                                                       According to the ADA  Clinical Practice Recommendations for 2011, when HbA1c is used as a screening test:   >=6.5%   Diagnostic of Diabetes Mellitus           (if abnormal result  is confirmed)  5.7-6.4%   Increased risk of developing Diabetes Mellitus  References:Diagnosis and Classification of Diabetes Mellitus,Diabetes [id number],5638,93(TDSKA 1):S62-S69 and Standards of Medical Care in         Diabetes - 2011,Diabetes [id number],62  (Suppl 1):S11-S61.       Assessment/Plan  1. Burning with urination - POC Urinalysis Dipstick - Urine culture  2. Essential hypertension  Controlled  3. Hematuria, microscopic  Continue to follow   4. Sprain of right lower leg, subsequent encounter  Resolved

## 2015-09-06 LAB — URINE CULTURE: ORGANISM ID, BACTERIA: NO GROWTH

## 2015-09-07 LAB — PANCREATIC ELASTASE, FECAL: PANCREATIC ELASTASE-1, STL: 404 ug/g

## 2015-09-24 DIAGNOSIS — J310 Chronic rhinitis: Secondary | ICD-10-CM | POA: Diagnosis not present

## 2015-09-24 DIAGNOSIS — J029 Acute pharyngitis, unspecified: Secondary | ICD-10-CM | POA: Diagnosis not present

## 2015-10-05 ENCOUNTER — Ambulatory Visit: Payer: Medicare Other | Admitting: Gastroenterology

## 2015-10-11 ENCOUNTER — Other Ambulatory Visit: Payer: Self-pay | Admitting: Internal Medicine

## 2015-10-17 DIAGNOSIS — L821 Other seborrheic keratosis: Secondary | ICD-10-CM | POA: Diagnosis not present

## 2015-10-17 DIAGNOSIS — L723 Sebaceous cyst: Secondary | ICD-10-CM | POA: Diagnosis not present

## 2015-10-30 ENCOUNTER — Other Ambulatory Visit: Payer: Medicare Other

## 2015-10-30 DIAGNOSIS — N400 Enlarged prostate without lower urinary tract symptoms: Secondary | ICD-10-CM | POA: Diagnosis not present

## 2015-10-30 DIAGNOSIS — E785 Hyperlipidemia, unspecified: Secondary | ICD-10-CM

## 2015-10-30 DIAGNOSIS — I10 Essential (primary) hypertension: Secondary | ICD-10-CM

## 2015-10-30 DIAGNOSIS — Z0000 Encounter for general adult medical examination without abnormal findings: Secondary | ICD-10-CM | POA: Diagnosis not present

## 2015-10-31 ENCOUNTER — Other Ambulatory Visit: Payer: Medicare Other

## 2015-10-31 LAB — COMPREHENSIVE METABOLIC PANEL
A/G RATIO: 1.3 (ref 1.1–2.5)
ALK PHOS: 51 IU/L (ref 39–117)
ALT: 23 IU/L (ref 0–44)
AST: 31 IU/L (ref 0–40)
Albumin: 3.9 g/dL (ref 3.5–4.8)
BUN/Creatinine Ratio: 18 (ref 10–22)
BUN: 19 mg/dL (ref 8–27)
Bilirubin Total: 1.2 mg/dL (ref 0.0–1.2)
CALCIUM: 9.4 mg/dL (ref 8.6–10.2)
CO2: 28 mmol/L (ref 18–29)
Chloride: 100 mmol/L (ref 97–106)
Creatinine, Ser: 1.03 mg/dL (ref 0.76–1.27)
GFR calc Af Amer: 81 mL/min/{1.73_m2} (ref >59)
GFR, EST NON AFRICAN AMERICAN: 70 mL/min/{1.73_m2} (ref >59)
Globulin, Total: 2.9 g/dL (ref 1.5–4.5)
Glucose: 93 mg/dL (ref 65–99)
POTASSIUM: 4.5 mmol/L (ref 3.5–5.2)
Sodium: 143 mmol/L (ref 136–144)
Total Protein: 6.8 g/dL (ref 6.0–8.5)

## 2015-10-31 LAB — LIPID PANEL
CHOL/HDL RATIO: 2.9 ratio (ref 0.0–5.0)
Cholesterol, Total: 171 mg/dL (ref 100–199)
HDL: 59 mg/dL (ref >39)
LDL CALC: 99 mg/dL (ref 0–99)
TRIGLYCERIDES: 66 mg/dL (ref 0–149)
VLDL Cholesterol Cal: 13 mg/dL (ref 5–40)

## 2015-10-31 LAB — PSA: PROSTATE SPECIFIC AG, SERUM: 1.5 ng/mL (ref 0.0–4.0)

## 2015-11-06 ENCOUNTER — Encounter: Payer: Self-pay | Admitting: Internal Medicine

## 2015-11-06 ENCOUNTER — Ambulatory Visit (INDEPENDENT_AMBULATORY_CARE_PROVIDER_SITE_OTHER): Payer: Medicare Other | Admitting: Internal Medicine

## 2015-11-06 ENCOUNTER — Ambulatory Visit: Payer: Self-pay | Admitting: Internal Medicine

## 2015-11-06 VITALS — BP 110/72 | HR 62 | Temp 97.6°F | Resp 18 | Ht 69.0 in | Wt 195.4 lb

## 2015-11-06 DIAGNOSIS — Z0000 Encounter for general adult medical examination without abnormal findings: Secondary | ICD-10-CM

## 2015-11-06 DIAGNOSIS — N400 Enlarged prostate without lower urinary tract symptoms: Secondary | ICD-10-CM | POA: Diagnosis not present

## 2015-11-06 DIAGNOSIS — I25701 Atherosclerosis of coronary artery bypass graft(s), unspecified, with angina pectoris with documented spasm: Secondary | ICD-10-CM

## 2015-11-06 DIAGNOSIS — I10 Essential (primary) hypertension: Secondary | ICD-10-CM

## 2015-11-06 DIAGNOSIS — E785 Hyperlipidemia, unspecified: Secondary | ICD-10-CM

## 2015-11-06 DIAGNOSIS — F411 Generalized anxiety disorder: Secondary | ICD-10-CM | POA: Diagnosis not present

## 2015-11-06 DIAGNOSIS — I491 Atrial premature depolarization: Secondary | ICD-10-CM | POA: Diagnosis not present

## 2015-11-06 DIAGNOSIS — I209 Angina pectoris, unspecified: Secondary | ICD-10-CM

## 2015-11-06 NOTE — Progress Notes (Signed)
Patient ID: Alexander [name], male   DOB: Nov 30, 1936, 79 y.o.   MRN: [id number]    HISTORY AND PHYSICAL  Location:    [facility]   Place of Service:   OFFICE  Extended Emergency Contact Information Primary Emergency Contact: Bezanson,[name] Address: [place], [place] [address] [place] Phone: [phone] Work Phone: [phone] Relation: Spouse  Advanced Directive information Does patient have an advance directive?: Yes, Would patient like information on creating an advanced directive?: No - patient declined information, Does patient want to make changes to advanced directive?: No - Patient declined  Chief Complaint  Patient presents with  . Annual Exam    Annual Exam Labs printed  . Medical Management of Chronic Issues    HPI:  Essential hypertension - CONTROLLED  Coronary artery disease involving coronary bypass graft of native heart with angina pectoris with documented spasm (HCC) - No angina for 2 years.  Hyperlipidemia - controlled  Hypertrophy of prostate - miildly enlarged  Atrial premature complexes - none noted recently  Anxiety state - controlled with current medicaytions  Angina pectoris ([place]) - none in the last 2 yearts  Helicobacter pylori gastritis- - resolved form 2015   Past Medical History  Diagnosis Date  . Pneumonia   . Pulmonary embolism ([place]) 2012    s/p heart surgery  . Hyperlipidemia   . Dysuria   . Clotting disorder ([place])   . Difficulty urinating   . Rheumatic fever age 46  . History of anal fissures   . Anxiety   . Diverticulosis of colon (without mention of hemorrhage)     mild  . Internal hemorrhoids without mention of complication   . Allergic rhinitis due to pollen   . History of cholelithiasis   . Edema   . Nonspecific elevation of levels of transaminase or lactic acid dehydrogenase (LDH)   . Long term (current) use of anticoagulants   . Other voice and resonance disorders       after surgery, took breathing tube out and damaged something  . Coronary atherosclerosis of native coronary artery     with CABG 3/12  -MAZE procedure   . Hypertrophy of prostate with urinary obstruction and other lower urinary tract symptoms (LUTS)   . Impotence of organic origin   . Unspecified constipation   . Atrial fibrillation (HCC)     hx of  . Heart attack ([place]) 2012  . Anginal pain ([place])     once in a while, none recent  . Sleep apnea     no CPAP  . Asthma     exercise induced  . Skin cancer     Hx of squamous cell x2  . Basal cell carcinoma of skin of lip   . Jaundice age 50  . Essential hypertension, benign   . Pulmonary embolism ([place])     3/12  . 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 Anxiety disorder   . Helicobacter pylori (H. pylori)     Past Surgical History  Procedure Laterality Date  . Hernia repair  2010    bilateral  . Coronary artery bypass graft  Febuary 13, 2012    full maze   . Scrotum exploration  1990's    multiple  . Mohs surgery      squamus x2  . Vasectomy  04/1992  . Superficial skin cystectomy (l) hip Right 1997  dr [name]  . Hydrocele excision      dr [name]   . Spermatocelectomy  02/1999    Right, Dr.[name]  . Cholecystectomy N/A 11/18/2013    Procedure: LAPAROSCOPIC CHOLECYSTECTOMY With IOC;  Surgeon: Odis Hollingshead, MD;  Location: [facility];  Service: General;  Laterality: N/A;    Patient Care Team: Estill Dooms, MD as PCP - General (Internal Medicine)  Social History   Social History  . Marital Status: Married    Spouse Name: N/A  . Number of Children: 1  . Years of Education: N/A   Occupational History  . [employer]   Social History Main Topics  . Smoking status: Former Smoker -- 0.50 packs/day for 21 years    Types: Cigarettes    Quit date: 12/08/1977  . Smokeless tobacco: Never Used  . Alcohol Use: Yes     Comment: former heavy alcohol use, rare use  . Drug Use: No  . Sexual  Activity:    Partners: Female   Other Topics Concern  . Not on file   Social History Narrative   Daily caffeine       [name]   [name]   [name]   [name]   [name]    [name]   [name]   Dr.Simonds-Pulmonologist   Dr.Rosenbower-General Surgeon   [place] Surgeon   Dr.Granfortuna-Hematologist    Dr.Ramaswamy-Pulmonary   Dr.Chris Newman-ENT     reports that he quit smoking about 37 years ago. His smoking use included Cigarettes. He has a 10.5 pack-year smoking history. He has never used smokeless tobacco. He reports that he drinks alcohol. He reports that he does not use illicit drugs.  Family History  Problem Relation Age of Onset  . Bone cancer Mother   . Diabetes Mother   . Colon cancer Neg Hx   . Esophageal cancer Neg Hx   . Rectal cancer Neg Hx   . Stomach cancer Neg Hx    Family Status  Relation Status Death Age  . Mother Deceased   . Father Deceased   . Brother Alive   . Son Alive   . Son Deceased 22    accident    Immunization History  Administered Date(s) Administered  . Influenza Split 10/13/2013  . Influenza-Unspecified 10/02/2014  . Pneumococcal Conjugate-13 10/16/2010, 10/02/2014  . Td 12/15/2007    Allergies  Allergen Reactions  . Heparin     Blood clots   . Penicillin G Shortness Of Breath  . Aspirin     Breathing complications, able to take three times a week  . Codeine     Some is ok  . Oxycodone     Can not take percocet, palpitations  . Sucralfate     Could not sleep  . Zetia [Ezetimibe]     jittery  . Zocor [Simvastatin]     Liver enzymes increased  . Plavix [Clopidogrel Bisulfate] Palpitations    Medications: Patient's Medications  New Prescriptions   No medications on file  Previous Medications   ALBUTEROL (PROVENTIL HFA;VENTOLIN HFA) 108 (90 BASE) MCG/ACT INHALER    Inhale 2 puffs into the  lungs every 6 (six) hours as needed for wheezing.   ALPRAZOLAM (XANAX) 0.5 MG TABLET    TAKE 1 TO 2 TABLETS BY MOUTH DAILY TO HELP ANXIETY   AMITRIPTYLINE (ELAVIL) 25 MG TABLET    Take 6.25 mg by mouth at bedtime. Takes 1/4 tablet   ASPIRIN 81 MG TABLET  Take 81 mg by mouth 3 (three) times a week. Mondays, wednesdays and fridays   CHOLECALCIFEROL (VITAMIN D) 1000 UNITS TABLET    Take 1,000 Units by mouth daily.   DIAZEPAM (VALIUM) 5 MG TABLET    Take one tablet by mouth once daily to help rest   DICYCLOMINE (BENTYL) 10 MG CAPSULE    Take one po BID   FLAXSEED, LINSEED, 1000 MG CAPS    Take 1 capsule by mouth daily.   OMEPRAZOLE (PRILOSEC) 20 MG CAPSULE    Take 1 capsule (20 mg total) by mouth daily.   ROSUVASTATIN (CRESTOR) 10 MG TABLET    Take 1 tablet (10 mg total) by mouth daily.   UNDECYLENIC ACID (FUNGI-NAIL) 25 % SOLN    Apply daily to affected toenail  Modified Medications   No medications on file  Discontinued Medications   No medications on file    Review of Systems  Constitutional: Negative for fever, chills, activity change, appetite change, fatigue and unexpected weight change.  HENT: Positive for hearing loss. Negative for congestion and ear pain.   Eyes: Negative.   Respiratory: Negative.  Negative for choking, chest tightness and shortness of breath.   Cardiovascular: Negative.  Negative for chest pain, palpitations and leg swelling.  Gastrointestinal: Negative for nausea and diarrhea.       RUQ discomfort seems resolved. Had cholecystectomy in Dec 2014.  Endocrine: Negative.   Genitourinary: Negative.   Musculoskeletal:       Tender at right leg laterally and of the right foot since Feb 2016.  Skin: Negative.   Allergic/Immunologic: Negative.   Neurological: Negative.   Hematological: Negative.   Psychiatric/Behavioral: Negative.     Filed Vitals:   11/06/15 1535  BP: 110/72  Pulse: 62  Temp: 97.6 F (36.4 C)  TempSrc: Oral  Resp: 18  Height: '5\' 9"'  (1.753  m)  Weight: 195 lb 6.4 oz (88.633 kg)  SpO2: 97%   Body mass index is 28.84 kg/(m^2).  Physical Exam  Constitutional: He appears well-developed and well-nourished. No distress.  HENT:  Head: Normocephalic and atraumatic.  Right Ear: External ear normal.  Left Ear: External ear normal.  Nose: Nose normal.  Mouth/Throat: Oropharynx is clear and moist.  Dentures. Missing some teeth.  Eyes: Conjunctivae and EOM are normal. Pupils are equal, round, and reactive to light.  Neck: No JVD present. No tracheal deviation present. No thyromegaly present.  Cardiovascular: Normal rate, regular rhythm, normal heart sounds and intact distal pulses.  Exam reveals no gallop and no friction rub.   No murmur heard. Pulmonary/Chest: No respiratory distress. He has no wheezes. He has no rales. He exhibits no tenderness.  Abdominal: He exhibits no distension and no mass. There is no tenderness. There is no rebound and no guarding. No hernia.  post laparoscopic cholecystectomy scars.   Genitourinary:  Enlarged prostate  Musculoskeletal: He exhibits no edema or tenderness (anterior to the right lateral malleolus has resolved).  Neurological: He displays normal reflexes. No cranial nerve deficit. Coordination normal.  Skin: No rash noted. No erythema. No pallor.  Psychiatric: He has a normal mood and affect. His behavior is normal. Judgment and thought content normal.  Nursing note and vitals reviewed.   Labs reviewed: Lab Summary Latest Ref Rng 10/30/2015 08/28/2015 04/22/2015 04/14/2015 02/26/2015  Hemoglobin 14.1 - 18.1 g/dL (None) (None) 16.6 15.8 (None)  Hematocrit 43.5 - 53.7 % (None) (None) 50.6 48.0 (None)  White count 4.6 - 10.2 K/uL (None) (None) 7.6 6.8 (None)  Platelet count - (None) (None) (None) (None) (None)  Sodium 136 - 144 mmol/L 143 (None) (None) (None) (None)  Potassium 3.5 - 5.2 mmol/L 4.5 (None) (None) (None) (None)  Calcium 8.6 - 10.2 mg/dL 9.4 (None) (None) (None) (None)  Phosphorus  - (None) (None) (None) (None) (None)  Creatinine 0.76 - 1.27 mg/dL 1.03 (None) (None) (None) (None)  AST 0 - 40 IU/L 31 (None) (None) (None) (None)  Alk Phos 39 - 117 IU/L 51 (None) (None) (None) (None)  Bilirubin 0.0 - 1.2 mg/dL 1.2 (None) (None) (None) (None)  Glucose 65 - 99 mg/dL 93 (None) (None) (None) (None)  Cholesterol 0 - 200 mg/dL (None) 162 (None) (None) 151  HDL cholesterol >39 mg/dL 59 52.30 (None) (None) 51.10  Triglycerides 0 - 149 mg/dL 66 65.0 (None) (None) 92.0  LDL Direct - (None) (None) (None) (None) (None)  LDL Calc 0 - 99 mg/dL 99 96 (None) (None) 82  Total protein - (None) (None) (None) (None) (None)  Albumin 3.5 - 4.8 g/dL 3.9 (None) (None) (None) (None)   Lab Results  Component Value Date   BUN 19 10/30/2015   Lab Results  Component Value Date   HGBA1C * 01/17/2011    6.0 (NOTE)                                                                       According to the ADA Clinical Practice Recommendations for 2011, when HbA1c is used as a screening test:   >=6.5%   Diagnostic of Diabetes Mellitus           (if abnormal result  is confirmed)  5.7-6.4%   Increased risk of developing Diabetes Mellitus  References:Diagnosis and Classification of Diabetes Mellitus,Diabetes IOEV,0350,09(FGHWE 1):S62-S69 and Standards of Medical Care in         Diabetes - 2011,Diabetes Care,2011,34  (Suppl 1):S11-S61.   Lab Results  Component Value Date   TSH 3.340 10/17/2014     Assessment/Plan  1. Essential hypertension controlled  2. Coronary artery disease involving coronary bypass graft of native heart with angina pectoris with documented spasm (HCC) No angina  3. Hyperlipidemia controlled  4. Hypertrophy of prostate No nodule and normal PSA  5. Atrial premature complexes None recently  6. Anxiety state controlled  7. Angina pectoris ([place]) None in 2 years

## 2015-11-07 DIAGNOSIS — Z961 Presence of intraocular lens: Secondary | ICD-10-CM | POA: Diagnosis not present

## 2015-11-07 DIAGNOSIS — H04123 Dry eye syndrome of bilateral lacrimal glands: Secondary | ICD-10-CM | POA: Diagnosis not present

## 2015-11-07 DIAGNOSIS — H26491 Other secondary cataract, right eye: Secondary | ICD-10-CM | POA: Diagnosis not present

## 2015-12-12 ENCOUNTER — Ambulatory Visit: Payer: Medicare Other | Admitting: Gastroenterology

## 2016-01-09 NOTE — Addendum Note (Signed)
Addended by: Estill Dooms on: 01/09/2016 11:38 AM   Modules accepted: Level of Service

## 2016-01-10 DIAGNOSIS — L723 Sebaceous cyst: Secondary | ICD-10-CM | POA: Diagnosis not present

## 2016-01-10 DIAGNOSIS — Z0000 Encounter for general adult medical examination without abnormal findings: Secondary | ICD-10-CM | POA: Diagnosis not present

## 2016-01-23 ENCOUNTER — Encounter: Payer: Self-pay | Admitting: Internal Medicine

## 2016-01-23 ENCOUNTER — Ambulatory Visit (INDEPENDENT_AMBULATORY_CARE_PROVIDER_SITE_OTHER): Payer: Medicare Other | Admitting: Internal Medicine

## 2016-01-23 VITALS — BP 110/62 | HR 68 | Temp 98.1°F | Resp 20 | Ht 69.0 in | Wt 194.6 lb

## 2016-01-23 DIAGNOSIS — J069 Acute upper respiratory infection, unspecified: Secondary | ICD-10-CM | POA: Diagnosis not present

## 2016-01-23 DIAGNOSIS — I491 Atrial premature depolarization: Secondary | ICD-10-CM | POA: Diagnosis not present

## 2016-01-23 DIAGNOSIS — F411 Generalized anxiety disorder: Secondary | ICD-10-CM | POA: Diagnosis not present

## 2016-01-23 MED ORDER — ALPRAZOLAM 0.5 MG PO TABS: ORAL_TABLET | ORAL | Status: DC

## 2016-01-23 MED ORDER — DIAZEPAM 5 MG PO TABS: ORAL_TABLET | ORAL | Status: DC

## 2016-01-23 NOTE — Addendum Note (Signed)
Addended by: Estill Dooms on: 01/23/2016 05:05 PM   Modules accepted: Orders

## 2016-01-23 NOTE — Progress Notes (Signed)
Patient ID: Alexander [name], male   DOB: 01-10-1936, 80 y.o.   MRN: [id number]    Facility  [facility]    Place of Service:   OFFICE    Allergies  Allergen Reactions  . Heparin     Blood clots   . Penicillin G Shortness Of Breath  . Aspirin     Breathing complications, able to take three times a week  . Codeine     Some is ok  . Oxycodone     Can not take percocet, palpitations  . Sucralfate     Could not sleep  . Zetia [Ezetimibe]     jittery  . Zocor [Simvastatin]     Liver enzymes increased  . Plavix [Clopidogrel Bisulfate] Palpitations    Chief Complaint  Patient presents with  . Medical Management of Chronic Issues    feeling better, but also feeling weak.    HPI:  Had a respiratory virus starting the day after New Year's. Had low grade fever. Eyes were swollen and red. Felt weak. Ws sick for  3 weeks. Coughed up clear mucous. Sinus irritation and pain. Rhinorrhea. Pharyngitis initially resplved.  Then either relapsed or got a 2nd virus. Started on Z-pack and is starting to feel better. Lungs felt a little sore. Feels like he is beginning to get over things.  No cardiac symptoms.  Medications: Patient's Medications  New Prescriptions   No medications on file  Previous Medications   ALBUTEROL (PROVENTIL HFA;VENTOLIN HFA) 108 (90 BASE) MCG/ACT INHALER    Inhale 2 puffs into the lungs every 6 (six) hours as needed for wheezing.   ALPRAZOLAM (XANAX) 0.5 MG TABLET    TAKE 1 TO 2 TABLETS BY MOUTH DAILY TO HELP ANXIETY   AMITRIPTYLINE (ELAVIL) 25 MG TABLET    Take 6.25 mg by mouth at bedtime. Reported on 01/23/2016   ASPIRIN 81 MG TABLET    Take 81 mg by mouth 3 (three) times a week. Mondays, wednesdays and fridays   CHOLECALCIFEROL (VITAMIN D) 1000 UNITS TABLET    Take 1,000 Units by mouth daily.   DIAZEPAM (VALIUM) 5 MG TABLET    Take one tablet by mouth once daily to help rest   DICYCLOMINE (BENTYL) 10 MG CAPSULE    Take one po BID   FLAXSEED, LINSEED, 1000 MG CAPS     Take 1 capsule by mouth daily. Reported on 01/23/2016   OMEPRAZOLE (PRILOSEC) 20 MG CAPSULE    Take 1 capsule (20 mg total) by mouth daily.   ROSUVASTATIN (CRESTOR) 10 MG TABLET    Take 1 tablet (10 mg total) by mouth daily.   UNDECYLENIC ACID (FUNGI-NAIL) 25 % SOLN    Apply daily to affected toenail  Modified Medications   No medications on file  Discontinued Medications   No medications on file    Review of Systems  Constitutional: Negative for fever, chills, activity change, appetite change, fatigue and unexpected weight change.  HENT: Positive for hearing loss. Negative for congestion and ear pain.   Eyes: Negative.   Respiratory: Negative.  Negative for choking, chest tightness and shortness of breath.   Cardiovascular: Negative.  Negative for chest pain, palpitations and leg swelling.  Gastrointestinal: Negative for nausea and diarrhea.       RUQ discomfort seems resolved. Had cholecystectomy in Dec 2014.  Endocrine: Negative.   Genitourinary: Negative.   Musculoskeletal:       Tender at right leg laterally and of the right foot since Feb 2016.  Skin: Negative.   Allergic/Immunologic: Negative.   Neurological: Negative.   Hematological: Negative.   Psychiatric/Behavioral: Negative.     Filed Vitals:   01/23/16 1621  BP: 110/62  Pulse: 68  Temp: 98.1 F (36.7 C)  TempSrc: Oral  Resp: 20  Height: 5' 9" (1.753 m)  Weight: 194 lb 9.6 oz (88.27 kg)  SpO2: 98%   Body mass index is 28.72 kg/(m^2). Filed Weights   01/23/16 1621  Weight: 194 lb 9.6 oz (88.27 kg)     Physical Exam  Constitutional: He appears well-developed and well-nourished. No distress.  HENT:  Head: Normocephalic and atraumatic.  Right Ear: External ear normal.  Left Ear: External ear normal.  Nose: Nose normal.  Mouth/Throat: Oropharynx is clear and moist.  Dentures. Missing some teeth.  Eyes: Conjunctivae and EOM are normal. Pupils are equal, round, and reactive to light.  Neck: No JVD  present. No tracheal deviation present. No thyromegaly present.  Cardiovascular: Normal rate, regular rhythm, normal heart sounds and intact distal pulses.  Exam reveals no gallop and no friction rub.   No murmur heard. Pulmonary/Chest: No respiratory distress. He has no wheezes. He has no rales. He exhibits no tenderness.  Abdominal: He exhibits no distension and no mass. There is no tenderness. There is no rebound and no guarding. No hernia.  post laparoscopic cholecystectomy scars.   Genitourinary:  Enlarged prostate  Musculoskeletal: He exhibits no edema or tenderness (anterior to the right lateral malleolus has resolved).  Neurological: He displays normal reflexes. No cranial nerve deficit. Coordination normal.  Skin: No rash noted. No erythema. No pallor.  Psychiatric: He has a normal mood and affect. His behavior is normal. Judgment and thought content normal.  Nursing note and vitals reviewed.   Labs reviewed: Lab Summary Latest Ref Rng 10/30/2015 08/28/2015 04/22/2015 04/14/2015 02/26/2015  Hemoglobin 14.1 - 18.1 g/dL (None) (None) 16.6 15.8 (None)  Hematocrit 43.5 - 53.7 % (None) (None) 50.6 48.0 (None)  White count 4.6 - 10.2 K/uL (None) (None) 7.6 6.8 (None)  Platelet count - (None) (None) (None) (None) (None)  Sodium 136 - 144 mmol/L 143 (None) (None) (None) (None)  Potassium 3.5 - 5.2 mmol/L 4.5 (None) (None) (None) (None)  Calcium 8.6 - 10.2 mg/dL 9.4 (None) (None) (None) (None)  Phosphorus - (None) (None) (None) (None) (None)  Creatinine 0.76 - 1.27 mg/dL 1.03 (None) (None) (None) (None)  AST 0 - 40 IU/L 31 (None) (None) (None) (None)  Alk Phos 39 - 117 IU/L 51 (None) (None) (None) (None)  Bilirubin 0.0 - 1.2 mg/dL 1.2 (None) (None) (None) (None)  Glucose 65 - 99 mg/dL 93 (None) (None) (None) (None)  Cholesterol 0 - 200 mg/dL (None) 162 (None) (None) 151  HDL cholesterol >39 mg/dL 59 52.30 (None) (None) 51.10  Triglycerides 0 - 149 mg/dL 66 65.0 (None) (None) 92.0  LDL  Direct - (None) (None) (None) (None) (None)  LDL Calc 0 - 99 mg/dL 99 96 (None) (None) 82  Total protein - (None) (None) (None) (None) (None)  Albumin 3.5 - 4.8 g/dL 3.9 (None) (None) (None) (None)   Lab Results  Component Value Date   TSH 3.340 10/17/2014   TSH 2.20 09/12/2014   TSH 2.039 01/29/2011   Lab Results  Component Value Date   BUN 19 10/30/2015   BUN 23 01/23/2015   BUN 17 10/17/2014   Lab Results  Component Value Date   HGBA1C * 01/17/2011    6.0 (NOTE)  According to the ADA Clinical Practice Recommendations for 2011, when HbA1c is used as a screening test:   >=6.5%   Diagnostic of Diabetes Mellitus           (if abnormal result  is confirmed)  5.7-6.4%   Increased risk of developing Diabetes Mellitus  References:Diagnosis and Classification of Diabetes Mellitus,Diabetes Care,2011,34(Suppl 1):S62-S69 and Standards of Medical Care in         Diabetes - 2011,Diabetes Care,2011,34  (Suppl 1):S11-S61.    Assessment/Plan  1. Acute upper respiratory infection Patient reassured that I felt he will be getting healthy again soon.   

## 2016-02-05 ENCOUNTER — Ambulatory Visit (INDEPENDENT_AMBULATORY_CARE_PROVIDER_SITE_OTHER): Payer: Medicare Other | Admitting: Gastroenterology

## 2016-02-05 ENCOUNTER — Encounter: Payer: Self-pay | Admitting: Gastroenterology

## 2016-02-05 VITALS — BP 118/60 | HR 68 | Ht 69.5 in | Wt 196.4 lb

## 2016-02-05 DIAGNOSIS — K648 Other hemorrhoids: Secondary | ICD-10-CM

## 2016-02-05 DIAGNOSIS — R1013 Epigastric pain: Secondary | ICD-10-CM

## 2016-02-05 NOTE — Patient Instructions (Signed)
Please follow up as needed 

## 2016-02-05 NOTE — Progress Notes (Signed)
HPI :  80 -year-old male previously followed by Dr. [name]. Past medical history significant for rheumatic fever, pulmonary embolism, hyperlipidemia, anal fissures, anxiety, diverticulosis, internal hemorrhoids, IBS, CAD, status post CABG with maze procedure, prostatic hypertrophy, atrial fibrillation, MI, angina, sleep apnea, asthma, hypertension, heparin-induced thrombocytopenia, H. pylori gastritis, and C. difficile diarrhea.  He previously was evaluated for dyspepsia. Upper endoscopy in June 2015 showed chronic active gastritis with H. pylori. He was treated for 10 days with combination of Flagyl, Biaxin, and a PPI but subsequent stool antigen testing remained positive. He was then retreated with the same regimen of Flagyl 250 mg 4 times daily, Biaxin 500 mg twice a day, and a PPI for 14 days. A repeat stool antigen for H. pylori 4 weeks later was again positive. He had initially reported an allergy to penicillin but eventually he was treated with amoxicillin 500 mg 4 times daily, Biaxin 500 mg twice daily, and a PPI twice daily for 14 days. Repeat stool antigen for H. pylori was negative. The patient had also had treatment for pneumonia last year and subsequently developed C. difficile. He was treated with a course of Flagyl and did well for a few weeks and then had recurrent C. difficile for which he was again treated with Flagyl with good resolution.  He reports he is here for follow up today. He was last seen in September by Cecille Rubin and had some ongoing dyspepsia. A repeat stool AG for [name] was negative in September.  Since the last visit he is eating well and generally denies any significant complaints. He has some bloating and fullness in his abdomen which can come and go, this usually occurs after dinner. He has not needed to take omeprazole or bentyl for his symptoms. He is concerned about risks of omeprazole and wants to avoid PPIs. He thinks bentyl works to help his symptoms when he takes  it. He thinks PPIs alter his bowel movements and doesn't like the way it makes him feel. He reports significant NSAID use in the past which he thinks may have been related to his gastritis initially. He is no longer taking NSAIDs.   He otherwise endorses symptomatic internal hemorrhoids he has been dealing with for  "years", he endorses an "irritation" of these which can occur from time to time. He denies any bleeding. He has only rare prolapse. He is asking about treatment modalities other than preparation H and hydrocortizone cream.  Prior workup Colonoscopy 06/2012 - hemorrhoids, diverticulosis EGD 2015 - [name] gastritis Normal gastric emptying study  Past Medical History  Diagnosis Date  . Pneumonia   . Pulmonary embolism ([place]) 2012    s/p heart surgery  . Hyperlipidemia   . Dysuria   . Clotting disorder ([place])   . Difficulty urinating   . Rheumatic fever age 89  . History of anal fissures   . Anxiety   . Diverticulosis of colon (without mention of hemorrhage)     mild  . Internal hemorrhoids without mention of complication   . Allergic rhinitis due to pollen   . History of cholelithiasis   . Edema   . Nonspecific elevation of levels of transaminase or lactic acid dehydrogenase (LDH)   . Long term (current) use of anticoagulants   . Other voice and resonance disorders     after surgery, took breathing tube out and damaged something  . Coronary atherosclerosis of native coronary artery     with CABG 3/12  -MAZE procedure   .  Hypertrophy of prostate with urinary obstruction and other lower urinary tract symptoms (LUTS)   . Impotence of organic origin   . Unspecified constipation   . Atrial fibrillation (HCC)     hx of  . Heart attack ([place]) 2012  . Anginal pain ([place])     once in a while, none recent  . Sleep apnea     no CPAP  . Asthma     exercise induced  . Skin cancer     Hx of squamous cell x2  . Basal cell carcinoma of skin of lip   . Jaundice age 28  .  Essential hypertension, benign   . Pulmonary embolism ([place])     3/12  . Heparin induced thrombocytopenia ([place])     3/12  . Anxiety disorder   . Helicobacter pylori (H. pylori)      Past Surgical History  Procedure Laterality Date  . Hernia repair  2010    bilateral  . Coronary artery bypass graft  Febuary 13, 2012    full maze   . Scrotum exploration  1990's    multiple  . Mohs surgery      squamus x2  . Vasectomy  04/1992  . Superficial skin cystectomy (l) hip Right 1997    dr [name]  . Hydrocele excision      dr [name]   . Spermatocelectomy  02/1999    Right, Dr.[name]  . Cholecystectomy N/A 11/18/2013    Procedure: LAPAROSCOPIC CHOLECYSTECTOMY With IOC;  Surgeon: Odis Hollingshead, MD;  Location: [facility];  Service: General;  Laterality: N/A;   Family History  Problem Relation Age of Onset  . Bone cancer Mother   . Diabetes Mother   . Colon cancer Neg Hx   . Esophageal cancer Neg Hx   . Rectal cancer Neg Hx   . Stomach cancer Neg Hx    Social History  Substance Use Topics  . Smoking status: Former Smoker -- 0.50 packs/day for 21 years    Types: Cigarettes    Quit date: 12/08/1977  . Smokeless tobacco: Never Used  . Alcohol Use: Yes     Comment: former heavy alcohol use, rare use   Current Outpatient Prescriptions  Medication Sig Dispense Refill  . albuterol (PROVENTIL HFA;VENTOLIN HFA) 108 (90 BASE) MCG/ACT inhaler Inhale 2 puffs into the lungs every 6 (six) hours as needed for wheezing. 1 Inhaler 0  . ALPRAZolam (XANAX) 0.5 MG tablet TAKE 1 TO 2 TABLETS BY MOUTH DAILY TO HELP ANXIETY 60 tablet 1  . amitriptyline (ELAVIL) 25 MG tablet Take 6.25 mg by mouth at bedtime. Reported on 01/23/2016    . aspirin 81 MG tablet Take 81 mg by mouth 3 (three) times a week. Mondays, wednesdays and fridays    . cholecalciferol (VITAMIN D) 1000 UNITS tablet Take 1,000 Units by mouth daily.    . diazepam (VALIUM) 5 MG tablet Take one or two tablets by mouth to help rest 60 tablet 5    . Flaxseed, Linseed, 1000 MG CAPS Take 1 capsule by mouth daily. Reported on 01/23/2016    . rosuvastatin (CRESTOR) 10 MG tablet Take 1 tablet (10 mg total) by mouth daily. 30 tablet 11  . Undecylenic Acid (FUNGI-NAIL) 25 % SOLN Apply daily to affected toenail 1 Bottle 0  . dicyclomine (BENTYL) 10 MG capsule Take one po BID (Patient not taking: Reported on 01/23/2016) 60 capsule 1  . omeprazole (PRILOSEC) 20 MG capsule Take 1 capsule (20 mg total) by  mouth daily. (Patient not taking: Reported on 01/23/2016) 90 capsule 3   No current facility-administered medications for this visit.   Allergies  Allergen Reactions  . Heparin     Blood clots   . Penicillin G Shortness Of Breath  . Aspirin     Breathing complications, able to take three times a week  . Codeine     Some is ok  . Oxycodone     Can not take percocet, palpitations  . Sucralfate     Could not sleep  . Zetia [Ezetimibe]     jittery  . Zocor [Simvastatin]     Liver enzymes increased  . Plavix [Clopidogrel Bisulfate] Palpitations     Review of Systems: All systems reviewed and negative except where noted in HPI.   Lab Results  Component Value Date   WBC 7.6 04/22/2015   HGB 16.6 04/22/2015   HCT 50.6 04/22/2015   MCV 92.1 04/22/2015   PLT 224 01/23/2015    Lab Results  Component Value Date   ALT 23 10/30/2015   AST 31 10/30/2015   ALKPHOS 51 10/30/2015   BILITOT 1.2 10/30/2015    Lab Results  Component Value Date   CREATININE 1.03 10/30/2015   BUN 19 10/30/2015   NA 143 10/30/2015   K 4.5 10/30/2015   CL 100 10/30/2015   CO2 28 10/30/2015     Physical Exam: BP 118/60 mmHg  Pulse 68  Ht 5' 9.5" (1.765 m)  Wt 196 lb 6 oz (89.075 kg)  BMI 28.59 kg/m2 Constitutional: Pleasant,well-developed, male in no acute distress. HEENT: Normocephalic and atraumatic. Conjunctivae are normal. No scleral icterus. Neck supple.  Cardiovascular: Normal rate, regular rhythm.  Pulmonary/chest: Effort normal and  breath sounds normal. No wheezing, rales or rhonchi. Abdominal: Soft, nondistended, nontender. Bowel sounds active throughout. There are no masses palpable. No hepatomegaly. Extremities: no edema Lymphadenopathy: No cervical adenopathy noted. Neurological: Alert and oriented to person place and time. Skin: Skin is warm and dry. No rashes noted. Psychiatric: Normal mood and affect. Behavior is normal.   ASSESSMENT AND PLAN: 80 y/o male with history of [name] gastritis / dyspepsia and C diff colitis, with history of hemorrhoids, here for follow up.  Dyspepsia - essentially resolved at this point in time. He received 3 courses of antibiotics to eradicate [name], the last 2 stool studies were negative when done OFF PPI. He has minimal symptoms at this time, and bentyl PRN works well for him. He is no longer using PPI. Recommend if he needs to use NSAIDs routinely in the future he should be on PPI, however he denies NSAID use. Can continue bentyl and follow up PRN for this issue if symptoms recur.   Internal hemorrhoids - longstanding symptoms of irritation. Prior colonoscopy has confirmed the diagnosis. I advised him to avoid use of hydrocortizone ointment which he has been using for years, as he can cause thinning of the mucosa. He can try a daily fiber supplement but otherwise counseled him on [place] banding procedure for which I think he is a good candidate. We discussed this for a bit and he wishes to think about it. If he wishes to have banding therapy he can follow up as needed.   [place] Cellar, MD [facility] Pager [phone]

## 2016-02-23 ENCOUNTER — Ambulatory Visit: Payer: Medicare Other

## 2016-02-24 ENCOUNTER — Ambulatory Visit (INDEPENDENT_AMBULATORY_CARE_PROVIDER_SITE_OTHER): Payer: Medicare Other | Admitting: Emergency Medicine

## 2016-02-24 ENCOUNTER — Ambulatory Visit (INDEPENDENT_AMBULATORY_CARE_PROVIDER_SITE_OTHER): Payer: Medicare Other | Admitting: Internal Medicine

## 2016-02-24 VITALS — BP 120/68 | HR 90 | Temp 99.0°F | Resp 20 | Ht 70.0 in | Wt 196.4 lb

## 2016-02-24 DIAGNOSIS — R059 Cough, unspecified: Secondary | ICD-10-CM

## 2016-02-24 DIAGNOSIS — R509 Fever, unspecified: Secondary | ICD-10-CM

## 2016-02-24 DIAGNOSIS — R05 Cough: Secondary | ICD-10-CM

## 2016-02-24 DIAGNOSIS — J101 Influenza due to other identified influenza virus with other respiratory manifestations: Secondary | ICD-10-CM

## 2016-02-24 DIAGNOSIS — J029 Acute pharyngitis, unspecified: Secondary | ICD-10-CM

## 2016-02-24 LAB — POCT CBC
Granulocyte percent: 72.9 %G (ref 37–80)
HCT, POC: 46.3 % (ref 43.5–53.7)
Hemoglobin: 16.5 g/dL (ref 14.1–18.1)
LYMPH, POC: 1.2 (ref 0.6–3.4)
MCH, POC: 32.4 pg — AB (ref 27–31.2)
MCHC: 35.6 g/dL — AB (ref 31.8–35.4)
MCV: 91.2 fL (ref 80–97)
MID (cbc): 0.6 (ref 0–0.9)
MPV: 6.3 fL (ref 0–99.8)
PLATELET COUNT, POC: 190 10*3/uL (ref 142–424)
POC Granulocyte: 4.9 (ref 2–6.9)
POC LYMPH %: 17.8 % (ref 10–50)
POC MID %: 9.3 %M (ref 0–12)
RBC: 5.08 M/uL (ref 4.69–6.13)
RDW, POC: 13.8 %
WBC: 6.7 10*3/uL (ref 4.6–10.2)

## 2016-02-24 LAB — POCT RAPID STREP A (OFFICE): Rapid Strep A Screen: NEGATIVE

## 2016-02-24 LAB — POCT INFLUENZA A/B
INFLUENZA A, POC: NEGATIVE
Influenza B, POC: POSITIVE — AB

## 2016-02-24 MED ORDER — OSELTAMIVIR PHOSPHATE 75 MG PO CAPS: 75.0000 mg | ORAL_CAPSULE | Freq: Two times a day (BID) | ORAL | Status: DC

## 2016-02-24 MED ORDER — BENZONATATE 100 MG PO CAPS: 100.0000 mg | ORAL_CAPSULE | Freq: Three times a day (TID) | ORAL | Status: DC | PRN

## 2016-02-24 NOTE — Progress Notes (Signed)
By signing my name below, I, Alexander [name], attest that this documentation has been prepared under the direction and in the presence of Alexander Jordan, MD. Electronically Signed: Judithe [name], ER Scribe. 02/24/2016. 8:25 AM.  Chief Complaint:  Chief Complaint  Patient presents with  . Cough    started friday   . Nasal Congestion    clear phlegm   . Fever    HPI: Alexander [name] is a 80 y.o. male with a past hx of pneumonia and PE who reports to [facility] today complaining of intermittent cold sx since the beginning of the year. January 1 he had a head cold that lasted three weeks, that never went into his chest. Three weeks after that he had another cold that lasted several weeks with coughing, sore throat and fatigue. Then he had another cold that started two days ago with fever, chills, cough productive of yellow purulence, and wheezing with associated sleep disturbance due to the cough. He had a fever of 100.4 and was up all night coughing up white flem.    Past Medical History  Diagnosis Date  . Pneumonia   . Pulmonary embolism ([place]) 2012    s/p heart surgery  . Hyperlipidemia   . Dysuria   . Clotting disorder ([place])   . Difficulty urinating   . Rheumatic fever age 37  . History of anal fissures   . Anxiety   . Diverticulosis of colon (without mention of hemorrhage)     mild  . Internal hemorrhoids without mention of complication   . Allergic rhinitis due to pollen   . History of cholelithiasis   . Edema   . Nonspecific elevation of levels of transaminase or lactic acid dehydrogenase (LDH)   . Long term (current) use of anticoagulants   . Other voice and resonance disorders     after surgery, took breathing tube out and damaged something  . Coronary atherosclerosis of native coronary artery     with CABG 3/12  -MAZE procedure   . Hypertrophy of prostate with urinary obstruction and other lower urinary tract symptoms (LUTS)   . Impotence of organic origin   .  Unspecified constipation   . Atrial fibrillation (HCC)     hx of  . Heart attack ([place]) 2012  . Anginal pain ([place])     once in a while, none recent  . Sleep apnea     no CPAP  . Asthma     exercise induced  . Skin cancer     Hx of squamous cell x2  . Basal cell carcinoma of skin of lip   . Jaundice age 43  . Essential hypertension, benign   . Pulmonary embolism ([place])     3/12  . Heparin induced thrombocytopenia ([place])     3/12  . Anxiety disorder   . Helicobacter pylori (H. pylori)    Past Surgical History  Procedure Laterality Date  . Hernia repair  2010    bilateral  . Coronary artery bypass graft  Febuary 13, 2012    full maze   . Scrotum exploration  1990's    multiple  . Mohs surgery      squamus x2  . Vasectomy  04/1992  . Superficial skin cystectomy (l) hip Right 1997    dr [name]  . Hydrocele excision      dr [name]   . Spermatocelectomy  02/1999    Right, Dr.[name]  . Cholecystectomy N/A 11/18/2013  Procedure: LAPAROSCOPIC CHOLECYSTECTOMY With IOC;  Surgeon: Odis Hollingshead, MD;  Location: [facility];  Service: General;  Laterality: N/A;   Social History   Social History  . Marital Status: Married    Spouse Name: N/A  . Number of Children: 1  . Years of Education: N/A   Occupational History  . [employer]   Social History Main Topics  . Smoking status: Former Smoker -- 0.50 packs/day for 21 years    Types: Cigarettes    Quit date: 12/08/1977  . Smokeless tobacco: Never Used  . Alcohol Use: Yes     Comment: former heavy alcohol use, rare use  . Drug Use: No  . Sexual Activity:    Partners: Female   Other Topics Concern  . None   Social History Narrative   Daily caffeine       [name]   [name]   [name]   [name]   [name]    [name]   [name]   Dr.Simonds-Pulmonologist   Dr.Rosenbower-General Surgeon   Willard  [name]-Cardiac Surgeon   Dr.Granfortuna-Hematologist    Dr.Ramaswamy-Pulmonary   Dr.Chris Newman-ENT    Family History  Problem Relation Age of Onset  . Bone cancer Mother   . Diabetes Mother   . Colon cancer Neg Hx   . Esophageal cancer Neg Hx   . Rectal cancer Neg Hx   . Stomach cancer Neg Hx    Allergies  Allergen Reactions  . Heparin     Blood clots   . Penicillin G Shortness Of Breath  . Aspirin     Breathing complications, able to take three times a week  . Codeine     Some is ok  . Oxycodone     Can not take percocet, palpitations  . Sucralfate     Could not sleep  . Zetia [Ezetimibe]     jittery  . Zocor [Simvastatin]     Liver enzymes increased  . Plavix [Clopidogrel Bisulfate] Palpitations   Prior to Admission medications   Medication Sig Start Date End Date Taking? Authorizing Provider  albuterol (PROVENTIL HFA;VENTOLIN HFA) 108 (90 BASE) MCG/ACT inhaler Inhale 2 puffs into the lungs every 6 (six) hours as needed for wheezing. 04/13/13  Yes Estill Dooms, MD  ALPRAZolam Duanne Moron) 0.5 MG tablet TAKE 1 TO 2 TABLETS BY MOUTH DAILY TO HELP ANXIETY 01/23/16  Yes Estill Dooms, MD  amitriptyline (ELAVIL) 25 MG tablet Take 6.25 mg by mouth at bedtime. Reported on 01/23/2016   Yes Historical Provider, MD  aspirin 81 MG tablet Take 81 mg by mouth 3 (three) times a week. Mondays, wednesdays and fridays   Yes Historical Provider, MD  cholecalciferol (VITAMIN D) 1000 UNITS tablet Take 1,000 Units by mouth daily.   Yes Historical Provider, MD  diazepam (VALIUM) 5 MG tablet Take one or two tablets by mouth to help rest 01/23/16  Yes Estill Dooms, MD  dicyclomine (BENTYL) 10 MG capsule Take one po BID 02/13/15  Yes [place] Dragon, MD  Flaxseed, Linseed, 1000 MG CAPS Take 1 capsule by mouth daily. Reported on 01/23/2016   Yes Historical Provider, MD  omeprazole (PRILOSEC) 20 MG capsule Take 1 capsule (20 mg total) by mouth daily. 01/31/15  Yes [place] Dragon, MD  rosuvastatin  (CRESTOR) 10 MG tablet Take 1 tablet (10 mg total) by mouth daily. 01/03/15  Yes Belva Crome, MD  Undecylenic Acid (FUNGI-NAIL) 25 % SOLN Apply daily to  affected toenail Patient not taking: Reported on 02/24/2016 10/18/14   Estill Dooms, MD     ROS: The patient denies fevers, chills, night sweats, unintentional weight loss, chest pain, palpitations, dyspnea on exertion, nausea, vomiting, abdominal pain, dysuria, hematuria, melena, numbness, weakness, or tingling.  All other systems have been reviewed and were otherwise negative with the exception of those mentioned in the HPI and as above.    PHYSICAL EXAM: Filed Vitals:   02/24/16 0813  BP: 120/68  Pulse: 90  Temp: 99 F (37.2 C)  Resp: 20   Body mass index is 28.18 kg/(m^2).   General: Alert, no acute distress HEENT:  Normocephalic, atraumatic, oropharynx patent, Nasal congestion bilaterally Eye: EOMI, PEERLDC Cardiovascular:  Regular rate and rhythm, no rubs murmurs or gallops.  No Carotid bruits, radial pulse intact. No pedal edema.  Respiratory: Clear to auscultation bilaterally.  No rales, or rhonchi.  No cyanosis, no use of accessory musculature. End expiratory wheeze.  Abdominal: No organomegaly, abdomen is soft and non-tender, positive bowel sounds.  No masses. Musculoskeletal: Gait intact. No edema, tenderness Skin: No rashes. Sternotomy scar from previous bypass surgery Neurologic: Facial musculature symmetric. Psychiatric: Patient acts appropriately throughout our interaction. Lymphatic: No cervical or submandibular lymphadenopathy   LABS: Results for orders placed or performed in visit on 02/24/16  POCT Influenza A/B  Result Value Ref Range   Influenza A, POC Negative Negative   Influenza B, POC Positive (A) Negative  POCT rapid strep A  Result Value Ref Range   Rapid Strep A Screen Negative Negative  POCT CBC  Result Value Ref Range   WBC 6.7 4.6 - 10.2 K/uL   Lymph, poc 1.2 0.6 - 3.4   POC LYMPH  PERCENT 17.8 10 - 50 %L   MID (cbc) 0.6 0 - 0.9   POC MID % 9.3 0 - 12 %M   POC Granulocyte 4.9 2 - 6.9   Granulocyte percent 72.9 37 - 80 %G   RBC 5.08 4.69 - 6.13 M/uL   Hemoglobin 16.5 14.1 - 18.1 g/dL   HCT, POC 46.3 43.5 - 53.7 %   MCV 91.2 80 - 97 fL   MCH, POC 32.4 (A) 27 - 31.2 pg   MCHC 35.6 (A) 31.8 - 35.4 g/dL   RDW, POC 13.8 %   Platelet Count, POC 190 142 - 424 K/uL   MPV 6.3 0 - 99.8 fL     EKG/XRAY:   Primary read interpreted by Dr. Everlene Farrier at [facility]. Dg Chest 2 View  02/24/2016  CLINICAL DATA:  Cough and fever EXAM: CHEST  2 VIEW COMPARISON:  Apr 22, 2015 FINDINGS: There is no edema or consolidation. The heart size and pulmonary vascularity are normal. No adenopathy. The patient is status post coronary artery bypass grafting. There is a left atrial appendage clamp present. No bone lesions evident. IMPRESSION: No edema or consolidation. Electronically Signed   By: Lowella Grip III M.D.   On: 02/24/2016 08:53    ASSESSMENT/PLAN: We'll treat with Tamiflu twice a day for 5 days. He was given Ladona Ridgel for cough.I personally performed the services described in this documentation, which was scribed in my presence. The recorded information has been reviewed and is accurate.   Gross sideeffects, risk and benefits, and alternatives of medications d/w patient. Patient is aware that all medications have potential sideeffects and we are unable to predict every sideeffect or drug-drug interaction that may occur.  Arlyss Queen MD 02/24/2016 8:25 AM

## 2016-02-24 NOTE — Patient Instructions (Addendum)
IF you received an x-ray today, you will receive an invoice from [facility]. Please contact [facility] at [phone] with questions or concerns regarding your invoice.   IF you received labwork today, you will receive an invoice from [employer]. Please contact [facility] at [phone] with questions or concerns regarding your invoice.   Our billing staff will not be able to assist you with questions regarding bills from these companies.  You will be contacted with the lab results as soon as they are available. The fastest way to get your results is to activate your My Chart account. Instructions are located on the last page of this paperwork. If you have not heard from [place] regarding the results in 2 weeks, please contact this office.     influenzaInfluenza, Adult Influenza ("the flu") is a viral infection of the respiratory tract. It occurs more often in winter months because people spend more time in close contact with one another. Influenza can make you feel very sick. Influenza easily spreads from person to person (contagious). CAUSES  Influenza is caused by a virus that infects the respiratory tract. You can catch the virus by breathing in droplets from an infected person's cough or sneeze. You can also catch the virus by touching something that was recently contaminated with the virus and then touching your mouth, nose, or eyes. RISKS AND COMPLICATIONS You may be at risk for a more severe case of influenza if you smoke cigarettes, have diabetes, have chronic heart disease (such as heart failure) or lung disease (such as asthma), or if you have a weakened immune system. Elderly people and pregnant women are also at risk for more serious infections. The most common problem of influenza is a lung infection (pneumonia). Sometimes, this problem can require emergency medical care and may be life threatening. SIGNS AND SYMPTOMS  Symptoms  typically last 4 to 10 days and may include:  Fever.  Chills.  Headache, body aches, and muscle aches.  Sore throat.  Chest discomfort and cough.  Poor appetite.  Weakness or feeling tired.  Dizziness.  Nausea or vomiting. DIAGNOSIS  Diagnosis of influenza is often made based on your history and a physical exam. A nose or throat swab test can be done to confirm the diagnosis. TREATMENT  In mild cases, influenza goes away on its own. Treatment is directed at relieving symptoms. For more severe cases, your health care provider may prescribe antiviral medicines to shorten the sickness. Antibiotic medicines are not effective because the infection is caused by a virus, not by bacteria. HOME CARE INSTRUCTIONS  Take medicines only as directed by your health care provider.  Use a cool mist humidifier to make breathing easier.  Get plenty of rest until your temperature returns to normal. This usually takes 3 to 4 days.  Drink enough fluid to keep your urine clear or pale yellow.  Cover yourmouth and nosewhen coughing or sneezing,and wash your handswellto prevent thevirusfrom spreading.  Stay homefromwork orschool untilthe fever is gonefor at least 57full day. PREVENTION  An annual influenza vaccination (flu shot) is the best way to avoid getting influenza. An annual flu shot is now routinely recommended for all adults in the [place] IF:  You experiencechest pain, yourcough worsens,or you producemore mucus.  Youhave nausea,vomiting, ordiarrhea.  Your fever returns or gets worse. SEEK IMMEDIATE MEDICAL CARE IF:  You havetrouble breathing, you become short of breath,or your skin ornails becomebluish.  You have severe painor  stiffnessin the neck.  You develop a sudden headache, or pain in the face or ear.  You have nausea or vomiting that you cannot control. MAKE SURE YOU:   Understand these instructions.  Will watch your  condition.  Will get help right away if you are not doing well or get worse.   This information is not intended to replace advice given to you by your health care provider. Make sure you discuss any questions you have with your health care provider.   Document Released: 11/21/2000 Document Revised: 12/15/2014 Document Reviewed: 02/23/2012 Elsevier Interactive Patient Education [employer].

## 2016-02-27 ENCOUNTER — Telehealth: Payer: Self-pay

## 2016-02-27 ENCOUNTER — Ambulatory Visit (INDEPENDENT_AMBULATORY_CARE_PROVIDER_SITE_OTHER): Payer: Medicare Other

## 2016-02-27 ENCOUNTER — Telehealth: Payer: Self-pay | Admitting: *Deleted

## 2016-02-27 ENCOUNTER — Ambulatory Visit (INDEPENDENT_AMBULATORY_CARE_PROVIDER_SITE_OTHER): Payer: Medicare Other | Admitting: Family Medicine

## 2016-02-27 VITALS — BP 122/62 | HR 70 | Temp 98.3°F | Resp 16 | Ht 69.0 in | Wt 195.0 lb

## 2016-02-27 DIAGNOSIS — J209 Acute bronchitis, unspecified: Secondary | ICD-10-CM | POA: Diagnosis not present

## 2016-02-27 DIAGNOSIS — R059 Cough, unspecified: Secondary | ICD-10-CM

## 2016-02-27 DIAGNOSIS — R062 Wheezing: Secondary | ICD-10-CM | POA: Diagnosis not present

## 2016-02-27 DIAGNOSIS — R05 Cough: Secondary | ICD-10-CM | POA: Diagnosis not present

## 2016-02-27 LAB — POCT CBC
Granulocyte percent: 59.9 %G (ref 37–80)
HCT, POC: 43.6 % (ref 43.5–53.7)
Hemoglobin: 15.4 g/dL (ref 14.1–18.1)
Lymph, poc: 1.2 (ref 0.6–3.4)
MCH, POC: 32.2 pg — AB (ref 27–31.2)
MCHC: 35.4 g/dL (ref 31.8–35.4)
MCV: 91 fL (ref 80–97)
MID (cbc): 0.2 (ref 0–0.9)
MPV: 6.3 fL (ref 0–99.8)
POC Granulocyte: 2 (ref 2–6.9)
POC LYMPH PERCENT: 35.1 %L (ref 10–50)
POC MID %: 5 %M (ref 0–12)
Platelet Count, POC: 173 10*3/uL (ref 142–424)
RBC: 4.79 M/uL (ref 4.69–6.13)
RDW, POC: 13.4 %
WBC: 3.4 10*3/uL — AB (ref 4.6–10.2)

## 2016-02-27 MED ORDER — LEVOFLOXACIN 500 MG PO TABS: 500.0000 mg | ORAL_TABLET | Freq: Every day | ORAL | Status: DC

## 2016-02-27 MED ORDER — HYDROCODONE-ACETAMINOPHEN 7.5-325 MG/15ML PO SOLN: 10.0000 mL | Freq: Four times a day (QID) | ORAL | Status: DC | PRN

## 2016-02-27 NOTE — Telephone Encounter (Signed)
Pt states he had just seen Dr Synetta Shadow and the antibiotic he was given may cause him to have side effects that he doesn't think will agree with him, would like to have a different kind called in for him Please call pt at [place]

## 2016-02-27 NOTE — Telephone Encounter (Signed)
I think he should take the Tamiflu. The sooner he gets started on it, the better. It is unlikely that another chest x-ray is going to show anything different.

## 2016-02-27 NOTE — Telephone Encounter (Signed)
Patient called and stated that he started coughing Friday night and Saturday he went to Urgent Care and swab showed Positive Flu. No antibiotic, patient did not take Tamiflu recommended. Patient also didn't use the cough suppressant that was recommended because he wanted to cough the stuff out.  Patient has a history of getting pneumonia. They did an X-Ray and it didn't show anything. Patient stated that he felt alittle better yesterday but in the evening started feeling worse. Coughing up Yellow congestion and worried about getting pneumonia. Wanted to know if you could order a chest x-ray or what do you advise. No available appointments. Please Advise.

## 2016-02-27 NOTE — Patient Instructions (Addendum)
Let us know if not feeling better in the next 5 days    IF you received an x-ray today, you will receive an invoice from [facility]. Please contact [facility] at [phone] with questions or concerns regarding your invoice.   IF you received labwork today, you will receive an invoice from [employer]. Please contact [facility] at [phone] with questions or concerns regarding your invoice.   Our billing staff will not be able to assist you with questions regarding bills from these companies.  You will be contacted with the lab results as soon as they are available. The fastest way to get your results is to activate your My Chart account. Instructions are located on the last page of this paperwork. If you have not heard from [place] regarding the results in 2 weeks, please contact this office.

## 2016-02-27 NOTE — Progress Notes (Addendum)
Subjective:    Patient ID: Alexander [name], male    DOB: 1936-03-07, 80 y.o.   MRN: [id number] By signing my name below, I, Zola Button, attest that this documentation has been prepared under the direction and in the presence of Robyn Haber, MD.  Electronically Signed: Zola Button, Medical Scribe. 02/27/2016. 2:09 PM.  HPI HPI Comments: Alexander [name] is a 80 y.o. male who presents to the [facility] complaining of gradual onset, productive cough with thick phlegm that started 5 days ago. He was seen here 3 days ago by Dr. Everlene Farrier and was diagnosed with Influenza B. Patient reports having associated loss of appetite, chest soreness due to cough, intermittent aching pain in his shoulders and calves, and fever with T-max 104 F, but notes his temperature has been around 99.2 F recently. He is concerned he may have pneumonia as he has felt SOB over the last few days. Patient denies vomiting. 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   Patient never filled Tamiflu because it was too expensive.   Review of Systems  Constitutional: Positive for fever and appetite change.  Respiratory: Positive for cough and shortness of breath.   Cardiovascular: Positive for chest pain.  Musculoskeletal: Positive for myalgias and arthralgias.       Objective:   Physical Exam CONSTITUTIONAL: Well developed/well nourished HEAD: Normocephalic/atraumatic EYES: EOM/PERRL ENMT: Mucous membranes moist NECK: supple no meningeal signs SPINE: entire spine nontender CV: S1/S2 noted, no murmurs/rubs/gallops noted LUNGS: Bilateral wheezing and rhonchi, worse on the right. ABDOMEN: soft, nontender, no rebound or guarding GU: no cva tenderness NEURO: Pt is awake/alert, moves all extremitiesx4 EXTREMITIES: pulses normal, full ROM SKIN: warm, color normal PSYCH: no abnormalities of mood noted Results  for orders placed or performed in visit on 02/27/16  POCT CBC  Result Value Ref Range   WBC 3.4 (A) 4.6 - 10.2 K/uL   Lymph, poc 1.2 0.6 - 3.4   POC LYMPH PERCENT 35.1 10 - 50 %L   MID (cbc) 0.2 0 - 0.9   POC MID % 5.0 0 - 12 %M   POC Granulocyte 2.0 2 - 6.9   Granulocyte percent 59.9 37 - 80 %G   RBC 4.79 4.69 - 6.13 M/uL   Hemoglobin 15.4 14.1 - 18.1 g/dL   HCT, POC 43.6 43.5 - 53.7 %   MCV 91.0 80 - 97 fL   MCH, POC 32.2 (A) 27 - 31.2 pg   MCHC 35.4 31.8 - 35.4 g/dL   RDW, POC 13.4 %   Platelet Count, POC 173 142 - 424 K/uL   MPV 6.3 0 - 99.8 fL   Chest x-ray: Increased bronchial markings    Assessment & Plan:   Assessment: Patient is clearly getting worse with cough being more productive, change in color, and overall feeling worse. The white count still suggest an influenza virus is at work here but given his past history of pneumonia, he will need an antibiotic.  This chart was scribed in my presence and reviewed by me personally.    ICD-9-CM ICD-10-CM   1. Cough 786.2 R05 POCT CBC     DG Chest 2 View     levofloxacin (LEVAQUIN) 500 MG tablet  2. Wheezing 786.07 R06.2 POCT CBC     DG Chest 2 View     levofloxacin (LEVAQUIN) 500 MG tablet  3.  Acute bronchitis, unspecified organism 466.0 J20.9      Signed, Robyn Haber, MD

## 2016-02-27 NOTE — Telephone Encounter (Signed)
LMOM to return call.

## 2016-02-27 NOTE — Telephone Encounter (Signed)
Patient went to Urgent Care and they gave him and antibiotic. Levofloxacin 500mg . They did an x-ray and it didn't show pneumonia but told him that his lungs looked bruise. Patient stated that there are too [name] side effects to the Levofloxacin and does not want to take it. Patient stated that he is going to take Dr. Hervey Ard advise and try the Tamiflu. Patient is not going to take the Levofloxacin.

## 2016-02-29 ENCOUNTER — Ambulatory Visit (INDEPENDENT_AMBULATORY_CARE_PROVIDER_SITE_OTHER): Payer: Medicare Other | Admitting: Physician Assistant

## 2016-02-29 ENCOUNTER — Telehealth: Payer: Self-pay | Admitting: Emergency Medicine

## 2016-02-29 VITALS — BP 130/72 | HR 68 | Temp 98.6°F | Resp 16 | Ht 69.0 in | Wt 193.0 lb

## 2016-02-29 DIAGNOSIS — R062 Wheezing: Secondary | ICD-10-CM

## 2016-02-29 DIAGNOSIS — R059 Cough, unspecified: Secondary | ICD-10-CM

## 2016-02-29 DIAGNOSIS — Z8709 Personal history of other diseases of the respiratory system: Secondary | ICD-10-CM

## 2016-02-29 DIAGNOSIS — R05 Cough: Secondary | ICD-10-CM | POA: Diagnosis not present

## 2016-02-29 DIAGNOSIS — Z8619 Personal history of other infectious and parasitic diseases: Secondary | ICD-10-CM

## 2016-02-29 LAB — POCT CBC
Granulocyte percent: 56.8 %G (ref 37–80)
HEMATOCRIT: 43.3 % — AB (ref 43.5–53.7)
Hemoglobin: 15.4 g/dL (ref 14.1–18.1)
LYMPH, POC: 1.5 (ref 0.6–3.4)
MCH, POC: 32.1 pg — AB (ref 27–31.2)
MCHC: 35.5 g/dL — AB (ref 31.8–35.4)
MCV: 90.4 fL (ref 80–97)
MID (cbc): 0.3 (ref 0–0.9)
MPV: 6.5 fL (ref 0–99.8)
POC GRANULOCYTE: 2.3 (ref 2–6.9)
POC LYMPH %: 35.5 % (ref 10–50)
POC MID %: 7.7 % (ref 0–12)
Platelet Count, POC: 200 10*3/uL (ref 142–424)
RBC: 4.79 M/uL (ref 4.69–6.13)
RDW, POC: 12.9 %
WBC: 4.1 10*3/uL — AB (ref 4.6–10.2)

## 2016-02-29 MED ORDER — IPRATROPIUM BROMIDE 0.02 % IN SOLN
0.5000 mg | Freq: Once | RESPIRATORY_TRACT | Status: AC
  Administered 2016-02-29: 0.5 mg via RESPIRATORY_TRACT

## 2016-02-29 MED ORDER — ALBUTEROL SULFATE (2.5 MG/3ML) 0.083% IN NEBU
2.5000 mg | INHALATION_SOLUTION | Freq: Once | RESPIRATORY_TRACT | Status: AC
  Administered 2016-02-29: 2.5 mg via RESPIRATORY_TRACT

## 2016-02-29 MED ORDER — METHYLPREDNISOLONE ACETATE 80 MG/ML IJ SUSP
80.0000 mg | Freq: Once | INTRAMUSCULAR | Status: AC
  Administered 2016-02-29: 80 mg via INTRAMUSCULAR

## 2016-02-29 NOTE — Telephone Encounter (Signed)
Patient called and stated that he took the Levofloxacin Wednesday and Thursday and it causes him not to sleep and can't breath with it. Patient stated that he has been awake all night. Stated that the Urgent care told him he was developing the Bronchitis. Patient did NOT take the Tamiflu. Patient states he cannot tolerate the Levofloxacin and wants a Z-Pac. Please Advise.

## 2016-02-29 NOTE — Progress Notes (Signed)
02/29/2016 4:42 PM   DOB: 09-10-1936 / MRN: [id number]  SUBJECTIVE:  Alexander [name] is a 80 y.o. male presenting for cough that has lingered after being diagnosed with influenza 5 days ago.  He has a history of pneumonia and came back to on Wednesday due to sputum thickening and was placed on Levaquin.  Reports this made him feel flush, dizzy and hallucinate.  He took two doses and had to stop.  He continues to complain of cough but feels the cough is high in his chest.  He has a history of asthma and can hear himself wheezing at night.  He has tried his albuterol inhaler and this gives him good relief.    He is allergic to heparin; penicillin g; levaquin; aspirin; codeine; oxycodone; sucralfate; zetia; zocor; and plavix.   He  has a past medical history of Pneumonia; Pulmonary embolism ([place]) (2012); Hyperlipidemia; Dysuria; Clotting disorder ([place]); Difficulty urinating; Rheumatic fever (age 70); History of anal fissures; Anxiety; Diverticulosis of colon (without mention of hemorrhage); Internal hemorrhoids without mention of complication; Allergic rhinitis due to pollen; History of cholelithiasis; Edema; Nonspecific elevation of levels of transaminase or lactic acid dehydrogenase (LDH); Long term (current) use of anticoagulants; Other voice and resonance disorders; Coronary atherosclerosis of native coronary artery; Hypertrophy of prostate with urinary obstruction and other lower urinary tract symptoms (LUTS); Impotence of organic origin; Unspecified constipation; Atrial fibrillation ([place]); Heart attack ([place]) (2012); Anginal pain ([place]); Sleep apnea; Asthma; Skin cancer; Basal cell carcinoma of skin of lip; Jaundice (age 50); Essential hypertension, benign; Pulmonary embolism ([place]); 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 pylori).    He  reports that he quit smoking about 38 years ago. His smoking use included Cigarettes. He has a 10.5 pack-year smoking  history. He has never used smokeless tobacco. He reports that he drinks alcohol. He reports that he does not use illicit drugs. He  reports that he currently engages in sexual activity and has had male partners. 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  His family history includes Bone cancer in his mother; Diabetes in his mother. There is no history of Colon cancer, Esophageal cancer, Rectal cancer, or Stomach cancer.  Review of Systems  Constitutional: Negative for fever.  Respiratory: Positive for cough, sputum production and wheezing. Negative for hemoptysis and shortness of breath.   Cardiovascular: Negative for chest pain.  Gastrointestinal: Negative for vomiting.  Skin: Negative for rash.  Neurological: Negative for headaches.    Problem list and medications reviewed and updated by myself where necessary, and exist elsewhere in the encounter.   OBJECTIVE:  BP 130/72 mmHg  Pulse 68  Temp(Src) 98.6 F (37 C) (Oral)  Resp 16  Ht 5\' 9"  (1.753 m)  Wt 193 lb (87.544 kg)  BMI 28.49 kg/m2  SpO2 98%  Physical Exam  Constitutional: He is oriented to person, place, and time. He appears well-developed. He does not appear ill.  Eyes: Conjunctivae and EOM are normal. Pupils are equal, round, and reactive to light.  Cardiovascular: Normal rate.   Pulmonary/Chest: Effort normal. He has wheezes (faint, expiratory).  Abdominal: He exhibits no distension.  Musculoskeletal: Normal range of motion.  Neurological: He is alert and oriented to person, place, and time. No cranial nerve deficit. Coordination normal.  Skin: Skin is warm and dry. He is not diaphoretic.  Psychiatric: He has a normal mood and affect.    Nursing note and vitals reviewed.   Results for orders placed or performed in visit on 02/29/16 (from the past 72 hour(s))  POCT CBC     Status: Abnormal   Collection Time: 02/29/16  4:34 PM  Result Value Ref Range   WBC 4.1 (A) 4.6 - 10.2 K/uL   Lymph, poc 1.5 0.6 - 3.4   POC LYMPH PERCENT 35.5 10 - 50 %L   MID (cbc) 0.3 0 - 0.9   POC MID % 7.7 0 - 12 %M   POC Granulocyte 2.3 2 - 6.9   Granulocyte percent 56.8 37 - 80 %G   RBC 4.79 4.69 - 6.13 M/uL   Hemoglobin 15.4 14.1 - 18.1 g/dL   HCT, POC 43.3 (A) 43.5 - 53.7 %   MCV 90.4 80 - 97 fL   MCH, POC 32.1 (A) 27 - 31.2 pg   MCHC 35.5 (A) 31.8 - 35.4 g/dL   RDW, POC 12.9 %   Platelet Count, POC 200 142 - 424 K/uL   MPV 6.5 0 - 99.8 fL   Lab Results  Component Value Date   HGBA1C * 01/17/2011    6.0 (NOTE)                                                                       According to the ADA Clinical Practice Recommendations for 2011, when HbA1c is used as a screening test:   >=6.5%   Diagnostic of Diabetes Mellitus           (if abnormal result  is confirmed)  5.7-6.4%   Increased risk of developing Diabetes Mellitus  References:Diagnosis and Classification of Diabetes Mellitus,Diabetes Care,2011,34(Suppl 1):S62-S69 and Standards of Medical Care in         Diabetes - 2011,Diabetes Care,2011,34  (Suppl 1):S11-S61.    No results found.   ASSESSMENT AND PLAN  Alexander [name] was seen today for follow-up and medication problem.  Diagnoses and all orders for this visit:  Cough: He is wheezing however overall he is doing very well.  Despite being prescribed tamiflu he could not afford this.  Will hold off on any abx treatment for now and will treat him for bronchospasm.  80 depomedrol today.  Advised he use his inhaler as needed q4 for the next few days.   -     POCT CBC  Wheezing -     albuterol (PROVENTIL) (2.5 MG/3ML) 0.083% nebulizer solution 2.5 mg; Take 3 mLs (2.5 mg total) by nebulization once. -     ipratropium (ATROVENT)  nebulizer solution 0.5 mg; Take 2.5 mLs (0.5 mg total) by nebulization once.  History of influenza    The patient was advised to call or return to clinic if he does not see an improvement in symptoms or to seek the care of the closest emergency department if he worsens with the above plan.   Philis Fendt, MHS, PA-C [facility] 02/29/2016 4:42 PM

## 2016-02-29 NOTE — Patient Instructions (Addendum)
Please get back to your physical activity routine as quickly as possible.     IF you received an x-ray today, you will receive an invoice from [facility]. Please contact [facility] at [phone] with questions or concerns regarding your invoice.   IF you received labwork today, you will receive an invoice from [employer]. Please contact [facility] at [phone] with questions or concerns regarding your invoice.   Our billing staff will not be able to assist you with questions regarding bills from these companies.  You will be contacted with the lab results as soon as they are available. The fastest way to get your results is to activate your My Chart account. Instructions are located on the last page of this paperwork. If you have not heard from [place] regarding the results in 2 weeks, please contact this office.

## 2016-02-29 NOTE — Telephone Encounter (Signed)
Patient stop me in the parking lot. He states he is not able to tolerate the Levaquin he is on. Tested positive for flu. He was seen in follow-up by Dr. Joseph Art and started on Levaquin. He states the medicine causes insomnia and makes him very jittery. He denies coughing up any colored phlegm. I told him to stop the Levaquin for now and to call if he had worsening of his cough.

## 2016-03-04 ENCOUNTER — Telehealth: Payer: Self-pay | Admitting: Gastroenterology

## 2016-03-04 NOTE — Telephone Encounter (Signed)
Patient notified and agreed. Has placed a call to his ENT also.

## 2016-03-04 NOTE — Telephone Encounter (Signed)
Yes that's fine he can try taking the omeprazole. However sounds like he may have pulled a muscle in his abdomen from coughing which would be a more likely cause of abdominal pain in the setting of coughing. Agree he should see his PCP for cough evaluation

## 2016-03-04 NOTE — Telephone Encounter (Signed)
I do not think that another antibiotic is warranted at this point. He has been seen twice at Urgent Care. He had the flu.

## 2016-03-04 NOTE — Telephone Encounter (Signed)
Patient states he had the flu and has coughed so much it caused pain. He had to take hydrocodone. This upset his stomach. Reports he had an episode of upper abdominal pain last night that lasted for 15-20 minutes. He is asking if he should take some Omeprazole to see if this helps. He will start this daily and see if this helps. He will call his PCP also and discuss the continued cough.

## 2016-03-05 ENCOUNTER — Ambulatory Visit (INDEPENDENT_AMBULATORY_CARE_PROVIDER_SITE_OTHER): Payer: Medicare Other

## 2016-03-05 ENCOUNTER — Ambulatory Visit (INDEPENDENT_AMBULATORY_CARE_PROVIDER_SITE_OTHER): Payer: Medicare Other | Admitting: Emergency Medicine

## 2016-03-05 VITALS — BP 110/80 | HR 70 | Temp 97.7°F | Resp 16 | Ht 69.0 in | Wt 190.2 lb

## 2016-03-05 DIAGNOSIS — J101 Influenza due to other identified influenza virus with other respiratory manifestations: Secondary | ICD-10-CM

## 2016-03-05 DIAGNOSIS — R062 Wheezing: Secondary | ICD-10-CM

## 2016-03-05 DIAGNOSIS — J3489 Other specified disorders of nose and nasal sinuses: Secondary | ICD-10-CM | POA: Diagnosis not present

## 2016-03-05 DIAGNOSIS — R05 Cough: Secondary | ICD-10-CM

## 2016-03-05 DIAGNOSIS — R059 Cough, unspecified: Secondary | ICD-10-CM

## 2016-03-05 DIAGNOSIS — R0602 Shortness of breath: Secondary | ICD-10-CM | POA: Diagnosis not present

## 2016-03-05 LAB — POCT CBC
Granulocyte percent: 63 %G (ref 37–80)
HEMATOCRIT: 43.9 % (ref 43.5–53.7)
HEMOGLOBIN: 15.9 g/dL (ref 14.1–18.1)
Lymph, poc: 1.8 (ref 0.6–3.4)
MCH, POC: 32.5 pg — AB (ref 27–31.2)
MCHC: 36.2 g/dL — AB (ref 31.8–35.4)
MCV: 90 fL (ref 80–97)
MID (cbc): 0.6 (ref 0–0.9)
MPV: 6.1 fL (ref 0–99.8)
POC GRANULOCYTE: 4.2 (ref 2–6.9)
POC LYMPH %: 27.4 % (ref 10–50)
POC MID %: 9.6 %M (ref 0–12)
Platelet Count, POC: 307 10*3/uL (ref 142–424)
RBC: 4.88 M/uL (ref 4.69–6.13)
RDW, POC: 13 %
WBC: 6.7 10*3/uL (ref 4.6–10.2)

## 2016-03-05 MED ORDER — BECLOMETHASONE DIPROPIONATE 80 MCG/ACT IN AERS: INHALATION_SPRAY | RESPIRATORY_TRACT | Status: DC

## 2016-03-05 MED ORDER — ALBUTEROL SULFATE (2.5 MG/3ML) 0.083% IN NEBU
2.5000 mg | INHALATION_SOLUTION | Freq: Once | RESPIRATORY_TRACT | Status: AC
  Administered 2016-03-05: 2.5 mg via RESPIRATORY_TRACT

## 2016-03-05 NOTE — Progress Notes (Signed)
By signing my name below, I, Moises Blood, attest that this documentation has been prepared under the direction and in the presence of Arlyss Queen, MD. Electronically Signed: Moises Blood, [place]. 03/05/2016 , 8:36 AM .  Patient was seen in room 8 .  Chief Complaint:  Chief Complaint  Patient presents with  . Follow-up    from last visit, still have cough and body feels weak    HPI: Alexander [name] is a 80 y.o. male who reports to [facility] today for follow up.  His initial visit with me was on 02/24/16 for the flu. He was seen by Philis Fendt for persistent cough. He also recently saw his GI doctor for abdominal pain.   He is still having cough and feels fatigue. He would occasionally still have productive cough with clear sputum with rare discoloration. He believes that it's a sinus infection because his chest feels mostly clear. He is a former smoker, quit in 1979.   Past Medical History  Diagnosis Date  . Pneumonia   . Pulmonary embolism ([place]) 2012    s/p heart surgery  . Hyperlipidemia   . Dysuria   . Clotting disorder ([place])   . Difficulty urinating   . Rheumatic fever age 79  . History of anal fissures   . Anxiety   . Diverticulosis of colon (without mention of hemorrhage)     mild  . Internal hemorrhoids without mention of complication   . Allergic rhinitis due to pollen   . History of cholelithiasis   . Edema   . Nonspecific elevation of levels of transaminase or lactic acid dehydrogenase (LDH)   . Long term (current) use of anticoagulants   . Other voice and resonance disorders     after surgery, took breathing tube out and damaged something  . Coronary atherosclerosis of native coronary artery     with CABG 3/12  -MAZE procedure   . Hypertrophy of prostate with urinary obstruction and other lower urinary tract symptoms (LUTS)   . Impotence of organic origin   . Unspecified constipation   . Atrial fibrillation (HCC)     hx of  . Heart attack ([place]) 2012  .  Anginal pain ([place])     once in a while, none recent  . Sleep apnea     no CPAP  . Asthma     exercise induced  . Skin cancer     Hx of squamous cell x2  . Basal cell carcinoma of skin of lip   . Jaundice age 62  . Essential hypertension, benign   . Pulmonary embolism ([place])     3/12  . 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 Anxiety disorder   . Helicobacter pylori (H. pylori)    Past Surgical History  Procedure Laterality Date  . Hernia repair  2010    bilateral  . Coronary artery bypass graft  Febuary 13, 2012    full maze   . Scrotum exploration  1990's    multiple  . Mohs surgery      squamus x2  . Vasectomy  04/1992  . Superficial skin cystectomy (l) hip Right 1997    dr [name]  . Hydrocele excision      dr [name]   . Spermatocelectomy  02/1999    Right, Dr.[name]  . Cholecystectomy N/A 11/18/2013    Procedure: LAPAROSCOPIC CHOLECYSTECTOMY With IOC;  Surgeon: Odis Hollingshead, MD;  Location: [facility];  Service: General;  Laterality: N/A;   Social History   Social History  . Marital Status: Married    Spouse Name: N/A  . Number of Children: 1  . Years of Education: N/A   Occupational History  . [employer]   Social History Main Topics  . Smoking status: Former Smoker -- 0.50 packs/day for 21 years    Types: Cigarettes    Quit date: 12/08/1977  . Smokeless tobacco: Never Used  . Alcohol Use: Yes     Comment: former heavy alcohol use, rare use  . Drug Use: No  . Sexual Activity:    Partners: Female   Other Topics Concern  . None   Social History Narrative   Daily caffeine       [name]   [name]   [name]   [name]   [name]    [name]   [name]   Dr.Simonds-Pulmonologist   Dr.Rosenbower-General Surgeon   [place] [name]-Cardiac Surgeon   Dr.Granfortuna-Hematologist    Dr.Ramaswamy-Pulmonary   Dr.Chris  Newman-ENT    Family History  Problem Relation Age of Onset  . Bone cancer Mother   . Diabetes Mother   . Colon cancer Neg Hx   . Esophageal cancer Neg Hx   . Rectal cancer Neg Hx   . Stomach cancer Neg Hx    Allergies  Allergen Reactions  . Heparin     Blood clots   . Penicillin G Shortness Of Breath  . Levaquin [Levofloxacin In D5w] Other (See Comments)    Patient felt jittery and nervous with insomnia on the medication.  . Aspirin     Breathing complications, able to take three times a week  . Codeine     Some is ok  . Oxycodone     Can not take percocet, palpitations  . Sucralfate     Could not sleep  . Zetia [Ezetimibe]     jittery  . Zocor [Simvastatin]     Liver enzymes increased  . Plavix [Clopidogrel Bisulfate] Palpitations   Prior to Admission medications   Medication Sig Start Date End Date Taking? Authorizing Provider  albuterol (PROVENTIL HFA;VENTOLIN HFA) 108 (90 BASE) MCG/ACT inhaler Inhale 2 puffs into the lungs every 6 (six) hours as needed for wheezing. 04/13/13   Estill Dooms, MD  ALPRAZolam Duanne Moron) 0.5 MG tablet TAKE 1 TO 2 TABLETS BY MOUTH DAILY TO HELP ANXIETY 01/23/16   Estill Dooms, MD  amitriptyline (ELAVIL) 25 MG tablet Take 6.25 mg by mouth at bedtime. Reported on 02/29/2016    Historical Provider, MD  aspirin 81 MG tablet Take 81 mg by mouth 3 (three) times a week. Mondays, wednesdays and fridays    Historical Provider, MD  benzonatate (TESSALON) 100 MG capsule Take 1-2 capsules (100-200 mg total) by mouth 3 (three) times daily as needed for cough. Patient not taking: Reported on 02/29/2016 02/24/16   Darlyne Russian, MD  cholecalciferol (VITAMIN D) 1000 UNITS tablet Take 1,000 Units by mouth daily.    Historical Provider, MD  diazepam (VALIUM) 5 MG tablet Take one or two tablets by mouth to help rest 01/23/16   Estill Dooms, MD  dicyclomine (BENTYL) 10 MG capsule Take one po BID Patient not taking: Reported on 02/29/2016 02/13/15   Lafayette Dragon, MD    Flaxseed, Linseed, 1000 MG CAPS Take 1 capsule by mouth daily. Reported on 01/23/2016    Historical Provider, MD  HYDROcodone-acetaminophen (HYCET) 7.5-325 mg/15 ml solution Take 10-15  mLs by mouth every 6 (six) hours as needed. 02/27/16   Robyn Haber, MD  levofloxacin (LEVAQUIN) 500 MG tablet Take 1 tablet (500 mg total) by mouth daily. 02/27/16   Robyn Haber, MD  omeprazole (PRILOSEC) 20 MG capsule Take 1 capsule (20 mg total) by mouth daily. Patient not taking: Reported on 02/29/2016 01/31/15   Lafayette Dragon, MD  rosuvastatin (CRESTOR) 10 MG tablet Take 1 tablet (10 mg total) by mouth daily. 01/03/15   Belva Crome, MD  Undecylenic Acid (FUNGI-NAIL) 25 % SOLN Apply daily to affected toenail Patient not taking: Reported on 02/29/2016 10/18/14   Estill Dooms, MD     ROS:  Constitutional: negative for fever, chills, night sweats, weight changes, or fatigue  HEENT: negative for vision changes, hearing loss, rhinorrhea, ST, epistaxis, or sinus pressure; positive for congestion Cardiovascular: negative for chest pain or palpitations Respiratory: negative for hemoptysis, wheezing, shortness of breath; positive for cough Abdominal: negative for abdominal pain, nausea, vomiting, diarrhea, or constipation Dermatological: negative for rash Neurologic: negative for headache, dizziness, or syncope All other systems reviewed and are otherwise negative with the exception to those above and in the HPI.  PHYSICAL EXAM: Filed Vitals:   03/05/16 0821  BP: 110/80  Pulse: 70  Temp: 97.7 F (36.5 C)  Resp: 16   Body mass index is 28.07 kg/(m^2).   General: Alert, no acute distress HEENT:  Normocephalic, atraumatic, oropharynx patent; Throat nl, neck supple Eye: [facility], [facility] Cardiovascular:  Regular rate and rhythm, no rubs murmurs or gallops.  No Carotid bruits, radial pulse intact. No pedal edema.  Respiratory: Clear to auscultation bilaterally.  No rales, or rhonchi.  No cyanosis, no use  of accessory musculature; chest bilateral wheezing, productive cough with thick whitish phlegm Abdominal: No organomegaly, abdomen is soft and non-tender, positive bowel sounds. No masses. Musculoskeletal: Gait intact. No edema, tenderness Skin: No rashes. Neurologic: Facial musculature symmetric. Psychiatric: Patient acts appropriately throughout our interaction.  Lymphatic: No cervical or submandibular lymphadenopathy Genitourinary/Anorectal: No acute findings   LABS: Results for orders placed or performed in visit on 03/05/16  POCT CBC  Result Value Ref Range   WBC 6.7 4.6 - 10.2 K/uL   Lymph, poc 1.8 0.6 - 3.4   POC LYMPH PERCENT 27.4 10 - 50 %L   MID (cbc) 0.6 0 - 0.9   POC MID % 9.6 0 - 12 %M   POC Granulocyte 4.2 2 - 6.9   Granulocyte percent 63.0 37 - 80 %G   RBC 4.88 4.69 - 6.13 M/uL   Hemoglobin 15.9 14.1 - 18.1 g/dL   HCT, POC 43.9 43.5 - 53.7 %   MCV 90.0 80 - 97 fL   MCH, POC 32.5 (A) 27 - 31.2 pg   MCHC 36.2 (A) 31.8 - 35.4 g/dL   RDW, POC 13.0 %   Platelet Count, POC 307 142 - 424 K/uL   MPV 6.1 0 - 99.8 fL    EKG/XRAY:    Dg Sinus 1-2 Views  03/05/2016  CLINICAL DATA:  Sinus pain and pressure EXAM: PARANASAL SINUSES - 1-2 VIEW COMPARISON:  None in PACs FINDINGS: The frontal sinuses are well pneumatized and clear. The maxillary sinuses exhibit no air-fluid levels or mucoperiosteal thickening. The ethmoid sinuses are clear where visualized. IMPRESSION: No acute or chronic paranasal sinus inflammation is observed on this single [place] view. Electronically Signed   By: David  Martinique M.D.   On: 03/05/2016 09:06   Dg Chest 2 View  03/05/2016  CLINICAL DATA:  Shortness of breath.  Cough, wheezing, flu EXAM: CHEST  2 VIEW COMPARISON:  02/27/2016 FINDINGS: Prior CABG. Mild hyperinflation of the lungs compatible with COPD. Heart and mediastinal contours are within normal limits. No focal opacities or effusions. No acute bony abnormality. IMPRESSION: Prior CABG.  Mild COPD  changes.  No active disease. Electronically Signed   By: Rolm Baptise M.D.   On: 03/05/2016 09:05   Dg Chest 2 View  02/27/2016  CLINICAL DATA:  Follow-up of cough and chest congestion ; history of coronary artery disease and CABG, pulmonary embolism, asthma, former smoker. EXAM: CHEST  2 VIEW COMPARISON:  PA and lateral chest x-ray of February 24, 2016. FINDINGS: The lungs are hyperinflated with hemidiaphragm flattening. There are post CABG changes. A left atrial appendage clip is present. There is no alveolar infiltrate. There is no pleural effusion. The bony thorax exhibits no acute abnormality. The sternal wires are intact. IMPRESSION: COPD, post CABG changes. There is no evidence of pneumonia, CHF, nor other acute cardiopulmonary abnormality. Electronically Signed   By: David  Martinique M.D.   On: 02/27/2016 14:42   Dg Chest 2 View  02/24/2016  CLINICAL DATA:  Cough and fever EXAM: CHEST  2 VIEW COMPARISON:  Apr 22, 2015 FINDINGS: There is no edema or consolidation. The heart size and pulmonary vascularity are normal. No adenopathy. The patient is status post coronary artery bypass grafting. There is a left atrial appendage clamp present. No bone lesions evident. IMPRESSION: No edema or consolidation. Electronically Signed   By: Lowella Grip III M.D.   On: 02/24/2016 08:53    ASSESSMENT/PLAN:   Gross sideeffects, risk and benefits, and alternatives of medications d/w patient. Patient is aware that all medications have potential sideeffects and we are unable to predict every sideeffect or drug-drug interaction that may occur.  Arlyss Queen MD 03/05/2016 8:36 AM

## 2016-03-05 NOTE — Patient Instructions (Addendum)
Please use your albuterol inhaler 2 puffs every 4-6 hours. Please try the Qvar 2 puffs twice a day followed by rinsing your mouth with water. Recheck 1 week if symptoms are persistent.    IF you received an x-ray today, you will receive an invoice from [facility]. Please contact [facility] at [phone] with questions or concerns regarding your invoice.   IF you received labwork today, you will receive an invoice from [employer]. Please contact [facility] at [phone] with questions or concerns regarding your invoice.   Our billing staff will not be able to assist you with questions regarding bills from these companies.  You will be contacted with the lab results as soon as they are available. The fastest way to get your results is to activate your My Chart account. Instructions are located on the last page of this paperwork. If you have not heard from [place] regarding the results in 2 weeks, please contact this office.

## 2016-03-10 ENCOUNTER — Telehealth: Payer: Self-pay

## 2016-03-10 NOTE — Telephone Encounter (Signed)
Pharm faxed PA notice for QVAR. Ins won't cover unless pt has failed Arnuity and Flovent. Dr Everlene Farrier, can you change to one of these?

## 2016-03-10 NOTE — Telephone Encounter (Signed)
Please change to Flovent 220 one puff twice a day. Rinse mouth with water after using.

## 2016-03-11 MED ORDER — FLUTICASONE PROPIONATE HFA 220 MCG/ACT IN AERO: 1.0000 | INHALATION_SPRAY | Freq: Two times a day (BID) | RESPIRATORY_TRACT | Status: DC

## 2016-03-11 NOTE — Telephone Encounter (Signed)
Sent in new Rx and asked them to explain change to pt when ready.

## 2016-03-14 ENCOUNTER — Other Ambulatory Visit: Payer: Medicare Other

## 2016-03-14 ENCOUNTER — Encounter: Payer: Self-pay | Admitting: Pulmonary Disease

## 2016-03-14 ENCOUNTER — Ambulatory Visit (INDEPENDENT_AMBULATORY_CARE_PROVIDER_SITE_OTHER): Payer: Medicare Other | Admitting: Pulmonary Disease

## 2016-03-14 VITALS — BP 134/72 | HR 75 | Ht 69.0 in | Wt 193.0 lb

## 2016-03-14 DIAGNOSIS — J3089 Other allergic rhinitis: Secondary | ICD-10-CM

## 2016-03-14 DIAGNOSIS — J4520 Mild intermittent asthma, uncomplicated: Secondary | ICD-10-CM

## 2016-03-14 DIAGNOSIS — J45909 Unspecified asthma, uncomplicated: Secondary | ICD-10-CM | POA: Insufficient documentation

## 2016-03-14 NOTE — Patient Instructions (Signed)
   Call me if you have any new breathing problems before your next appointment.  We will do a breathing test at your next appointment  TESTS ORDERED: 1. Quantitative immunoglobulin panel today 2. Full Pulmonary function testing and follow-up appointment

## 2016-03-14 NOTE — Progress Notes (Signed)
Subjective:    Patient ID: Alexander [name], male    DOB: Oct 17, 1936, 80 y.o.   MRN: [id number]  HPI He reports starting January 1 he developed a scratchy throat and watery eyes. He reports after approximately 3 weeks he had continued fatigue. He then took a Z-pack after 5-7 days. He started working out again at his gym. He had no cough or wheezing at the time. He reports then he developed a "scratchy throat" with chest congestion and cough productive of a clear mucus. He reports he did have some watery eyes around the first week of February. He  Reports he had a recurrence of his fatigue and malaise. His symptoms persisted for 2-3 weeks. He didn't take any over the counter medications at that time and was seen by his PCP on 3/19. He reports his symptoms progressively improved. Then about 3 weeks ago after going to the gym he noticed a sore throat and sinus congestion with watery eyes. He denies any fever, chills, or sweats. He reports he was diagnosed recently with Influenza B at an Urgent Care. He reports his cough is progressively resolving. He reports continued sinus congestion with production of a "white mucus". Still coughing up a white mucus as well. Recently he did have a fever to 101.75F but this has resolved. He does reports some intermittent chest tightness but no chest pain. He reports he uses an albuterol inhaler intermittently for exercise-induced asthma. He denies any history of recurrent bronchitis. He reports he was hospitalized in 2012 for his CABG and MAZE procedure. 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 He does have chronic sinus congestion & pressure. He attributes this somewhat to his deviated septum.   Review of Systems No abdominal pain, nausea, reflux, or morning brash water taste. No hematuria or dysuria. A pertinent 14 point review of systems is negative except as per the history of presenting illness.  Allergies  Allergen Reactions  . Heparin     Blood clots   .  Penicillin G Shortness Of Breath  . Levaquin [Levofloxacin In D5w] Other (See Comments)    Patient felt jittery and nervous with insomnia on the medication.  . Aspirin     Breathing complications, can tolerate baby asa  . Codeine     Some is ok  . Oxycodone     Can not take percocet, palpitations  . Sucralfate     Could not sleep  . Zetia [Ezetimibe]     jittery  . Zocor [Simvastatin]     Liver enzymes increased  . Plavix [Clopidogrel Bisulfate] Palpitations    Current Outpatient Prescriptions on File Prior to Visit  Medication Sig Dispense Refill  . albuterol (PROVENTIL HFA;VENTOLIN HFA) 108 (90 BASE) MCG/ACT inhaler Inhale 2 puffs into the lungs every 6 (six) hours as needed for wheezing. 1 Inhaler 0  . ALPRAZolam (XANAX) 0.5 MG tablet TAKE 1 TO 2 TABLETS BY MOUTH DAILY TO HELP ANXIETY 60 tablet 1  . aspirin 81 MG tablet Take 81 mg by mouth 3 (three) times a week. Mondays, wednesdays and fridays    . cholecalciferol (VITAMIN D) 1000 UNITS tablet Take 1,000 Units by mouth daily.    . diazepam (VALIUM) 5 MG tablet Take one or two tablets by mouth to help rest 60 tablet 5  . omeprazole (PRILOSEC) 20 MG capsule Take 1 capsule (20 mg total) by mouth daily. 90 capsule 3  . rosuvastatin (CRESTOR) 10 MG tablet Take 1 tablet (10 mg total) by  mouth daily. 30 tablet 11  . amitriptyline (ELAVIL) 25 MG tablet Take 6.25 mg by mouth at bedtime. Reported on 03/14/2016    . Flaxseed, Linseed, 1000 MG CAPS Take 1 capsule by mouth daily. Reported on 03/14/2016     No current facility-administered medications on file prior to visit.    Past Medical History  Diagnosis Date  . Pneumonia   . Pulmonary embolism ([place]) 2012    s/p heart surgery  . Hyperlipidemia   . Dysuria   . Clotting disorder ([place])   . Difficulty urinating   . Rheumatic fever age 42  . History of anal fissures   . Anxiety   . Diverticulosis of colon (without mention of hemorrhage)     mild  . Internal hemorrhoids without  mention of complication   . Allergic rhinitis due to pollen   . History of cholelithiasis   . Edema   . Nonspecific elevation of levels of transaminase or lactic acid dehydrogenase (LDH)   . Long term (current) use of anticoagulants   . Other voice and resonance disorders     after surgery, took breathing tube out and damaged something  . Coronary atherosclerosis of native coronary artery     with CABG 3/12  -MAZE procedure   . Hypertrophy of prostate with urinary obstruction and other lower urinary tract symptoms (LUTS)   . Impotence of organic origin   . Unspecified constipation   . Atrial fibrillation (HCC)     hx of  . Heart attack ([place]) 2012  . Anginal pain ([place])     once in a while, none recent  . Sleep apnea     no CPAP  . Asthma     exercise induced  . Skin cancer     Hx of squamous cell x2  . Basal cell carcinoma of skin of lip   . Jaundice age 24  . Essential hypertension, benign   . Pulmonary embolism ([place])     3/12  . 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 Anxiety disorder   . Helicobacter pylori (H. pylori)     Past Surgical History  Procedure Laterality Date  . Hernia repair  2010    bilateral  . Coronary artery bypass graft  Febuary 13, 2012    full maze   . Scrotum exploration  1990's    multiple  . Mohs surgery      squamus x2  . Vasectomy  04/1992  . Superficial skin cystectomy (l) hip Right 1997    dr [name]  . Hydrocele excision      dr [name]   . Spermatocelectomy  02/1999    Right, Dr.[name]  . Cholecystectomy N/A 11/18/2013    Procedure: LAPAROSCOPIC CHOLECYSTECTOMY With IOC;  Surgeon: Odis Hollingshead, MD;  Location: [facility];  Service: General;  Laterality: N/A;    Family History  Problem Relation Age of Onset  . Bone cancer Mother   . Diabetes Mother   . Colon cancer Neg Hx   . Esophageal cancer Neg Hx   . Rectal cancer Neg Hx   . Stomach cancer Neg Hx   . Lung disease Neg Hx     Social History   Social History  .  Marital Status: Married    Spouse Name: N/A  . Number of Children: 1  . Years of Education: N/A   Occupational History  . [employer]   Social History Main Topics  . Smoking  status: Former Smoker -- 1.00 packs/day for 22 years    Types: Cigarettes    Start date: 08/22/1956    Quit date: 12/08/1977  . Smokeless tobacco: Never Used     Comment: significant second-hand smoke exposure at work  . Alcohol Use: 0.0 oz/week    0 Standard drinks or equivalent per week     Comment: former heavy alcohol use, rare use  . Drug Use: No  . Sexual Activity:    Partners: Female   Other Topics Concern  . None   Social History Narrative   Daily caffeine       [name]   [name]   [name]   [name]   [name]    [name]   [name]   Dr.Simonds-Pulmonologist   Dr.Rosenbower-General Surgeon   [place] [name]-Cardiac Surgeon   Dr.Granfortuna-Hematologist    Dr.Ramaswamy-Pulmonary   Dr.Chris Newman-ENT        Pulmonary:   Reports her previously worked in [employer], [employer] with significant second-hand smoked exposure. No pets currently. No hot tub or bird exposure.        Objective:   Physical Exam BP 134/72 mmHg  Pulse 75  Ht 5\' 9"  (1.753 m)  Wt 193 lb (87.544 kg)  BMI 28.49 kg/m2  SpO2 97% General:  Awake. Alert. No acute distress.   Integument:  Warm & dry. No rash on exposed skin. No bruising. Lymphatics:  No appreciated cervical or supraclavicular lymphadenoapthy. HEENT:  Moist mucus membranes. No oral ulcers. No scleral injection or icterus. Mild bilateral nasal turbinate swelling. Normal tympanic membranes bilaterally. Cardiovascular:  Regular rate. No edema. No appreciable JVD.  Pulmonary:  Good aeration & clear to auscultation bilaterally. Symmetric chest wall expansion. No accessory muscle use. Abdomen: Soft. Normal bowel sounds. Nondistended. Grossly  nontender. Musculoskeletal:  Normal bulk and tone. Hand grip strength 5/5 bilaterally. No joint deformity or effusion appreciated. Neurological:  CN 2-12 grossly in tact. No meningismus. Moving all 4 extremities equally. Symmetric brachioradialis deep tendon reflexes. Psychiatric:  Mood and affect congruent. Speech normal rhythm, rate & tone.   PFT 10/12/14: FVC 4.42 L (105%) FEV1 3.55 L (111%) FEV1/FVC 0.76 FEF 25-75 3.60 L (128%) TLC 6.77L (99%) RV 78% ERV 22% DLCO uncorrected 122%  IMAGING CXR PA/LAT 03/05/16 (personally reviewed by me): No parenchymal opacity or nodule appreciated. No pleural effusion or thickening. Mild hyperinflation with flattening of the diaphragms and deep sulci bilaterally. SINUS X-RAY 2 VIEW 03/05/16 (per radiologist): No acute or chronic paranasal sinus inflammation.  LABS 03/05/16 CBC: 6.7/15.9/43.9/307    Assessment & Plan:   79 year old male with history of recurrent upper respiratory infections this spring. Most recently he did report influenza B infection. I suspect his degree of fatigue is due to the recurrent illnesses. Reviewing his previous pulmonary function testing is no signs of airway obstruction, restriction, or decreased carbon dioxide diffusion capacity that would suggest COPD or underlying emphysema. I reviewed his outside hospital CT scan which shows no emphysema. Does show some subpleural nodular changes particularly in his lower lobe dependent lungs which could certainly be atypical infection but is most likely scar tissue from his previous infections and prolonged hospitalization. I instructed the patient contact my office if he had any breathing problems before his next appointment.   1. Extrinsic asthma: Checking full pulmonary function testing at follow-up appointment. 2. Allergic rhinitis: Patient reports he is scheduling an appointment with ENT. 3. Recurrent respiratory infection: Checking serum quantitative immunoglobulin panel. 4. Health  maintenance: Patient  received Prevnar vaccine October 2015. 5. Follow-up: Patient to return to clinic 1-2 months or sooner if needed.   Sonia Baller Ashok Cordia, M.D. [facility] Pulmonary & Critical Care Pager:  [phone] After 3pm or if no response, call [phone] 4:15 PM 03/14/2016

## 2016-03-17 LAB — IGG, IGA, IGM
IGG (IMMUNOGLOBIN G), SERUM: 1060 mg/dL (ref 650–1600)
IgA: 327 mg/dL (ref 68–379)
IgM, Serum: 87 mg/dL (ref 41–251)

## 2016-03-24 DIAGNOSIS — J342 Deviated nasal septum: Secondary | ICD-10-CM | POA: Diagnosis not present

## 2016-03-24 DIAGNOSIS — J310 Chronic rhinitis: Secondary | ICD-10-CM | POA: Diagnosis not present

## 2016-03-24 DIAGNOSIS — G4733 Obstructive sleep apnea (adult) (pediatric): Secondary | ICD-10-CM | POA: Diagnosis not present

## 2016-04-16 DIAGNOSIS — N138 Other obstructive and reflux uropathy: Secondary | ICD-10-CM | POA: Diagnosis not present

## 2016-04-16 DIAGNOSIS — Z0000 Encounter for general adult medical examination without abnormal findings: Secondary | ICD-10-CM | POA: Diagnosis not present

## 2016-04-16 DIAGNOSIS — N401 Enlarged prostate with lower urinary tract symptoms: Secondary | ICD-10-CM | POA: Diagnosis not present

## 2016-04-16 DIAGNOSIS — L723 Sebaceous cyst: Secondary | ICD-10-CM | POA: Diagnosis not present

## 2016-04-16 DIAGNOSIS — R350 Frequency of micturition: Secondary | ICD-10-CM | POA: Diagnosis not present

## 2016-04-30 ENCOUNTER — Ambulatory Visit (INDEPENDENT_AMBULATORY_CARE_PROVIDER_SITE_OTHER): Payer: Medicare Other | Admitting: Pulmonary Disease

## 2016-04-30 DIAGNOSIS — J4520 Mild intermittent asthma, uncomplicated: Secondary | ICD-10-CM

## 2016-04-30 LAB — PULMONARY FUNCTION TEST
DL/VA % PRED: 82 %
DL/VA: 3.77 ml/min/mmHg/L
DLCO UNC: 26.43 ml/min/mmHg
DLCO cor % pred: 79 %
DLCO cor: 25.18 ml/min/mmHg
DLCO unc % pred: 83 %
FEF 25-75 Post: 4.17 L/sec
FEF 25-75 Pre: 3.57 L/sec
FEF2575-%Change-Post: 16 %
FEF2575-%PRED-POST: 204 %
FEF2575-%Pred-Pre: 174 %
FEV1-%CHANGE-POST: 4 %
FEV1-%PRED-POST: 130 %
FEV1-%Pred-Pre: 124 %
FEV1-PRE: 3.62 L
FEV1-Post: 3.79 L
FEV1FVC-%Change-Post: 5 %
FEV1FVC-%Pred-Pre: 111 %
FEV6-%Change-Post: 0 %
FEV6-%PRED-POST: 118 %
FEV6-%PRED-PRE: 118 %
FEV6-POST: 4.46 L
FEV6-PRE: 4.48 L
FEV6FVC-%CHANGE-POST: 0 %
FEV6FVC-%Pred-Post: 106 %
FEV6FVC-%Pred-Pre: 106 %
FVC-%CHANGE-POST: 0 %
FVC-%Pred-Post: 110 %
FVC-%Pred-Pre: 111 %
FVC-Post: 4.46 L
FVC-Pre: 4.49 L
POST FEV1/FVC RATIO: 85 %
POST FEV6/FVC RATIO: 100 %
PRE FEV6/FVC RATIO: 100 %
Pre FEV1/FVC ratio: 81 %
RV % PRED: 122 %
RV: 3.16 L
TLC % pred: 120 %
TLC: 8.35 L

## 2016-04-30 NOTE — Progress Notes (Signed)
PFT performed today. 

## 2016-05-02 ENCOUNTER — Other Ambulatory Visit: Payer: Medicare Other

## 2016-05-02 DIAGNOSIS — I10 Essential (primary) hypertension: Secondary | ICD-10-CM

## 2016-05-02 DIAGNOSIS — E785 Hyperlipidemia, unspecified: Secondary | ICD-10-CM | POA: Diagnosis not present

## 2016-05-03 LAB — LIPID PANEL
Chol/HDL Ratio: 3 ratio units (ref 0.0–5.0)
Cholesterol, Total: 168 mg/dL (ref 100–199)
HDL: 56 mg/dL (ref >39)
LDL Calculated: 98 mg/dL (ref 0–99)
Triglycerides: 69 mg/dL (ref 0–149)
VLDL Cholesterol Cal: 14 mg/dL (ref 5–40)

## 2016-05-03 LAB — COMPREHENSIVE METABOLIC PANEL
A/G RATIO: 1.6 (ref 1.2–2.2)
ALBUMIN: 4.1 g/dL (ref 3.5–4.8)
ALT: 18 IU/L (ref 0–44)
AST: 23 IU/L (ref 0–40)
Alkaline Phosphatase: 50 IU/L (ref 39–117)
BUN / CREAT RATIO: 12 (ref 10–24)
BUN: 13 mg/dL (ref 8–27)
Bilirubin Total: 0.9 mg/dL (ref 0.0–1.2)
CO2: 28 mmol/L (ref 18–29)
Calcium: 9.5 mg/dL (ref 8.6–10.2)
Chloride: 102 mmol/L (ref 96–106)
Creatinine, Ser: 1.13 mg/dL (ref 0.76–1.27)
GFR calc non Af Amer: 62 mL/min/{1.73_m2} (ref >59)
GFR, EST AFRICAN AMERICAN: 72 mL/min/{1.73_m2} (ref >59)
GLOBULIN, TOTAL: 2.5 g/dL (ref 1.5–4.5)
Glucose: 99 mg/dL (ref 65–99)
POTASSIUM: 5.2 mmol/L (ref 3.5–5.2)
Sodium: 143 mmol/L (ref 134–144)
TOTAL PROTEIN: 6.6 g/dL (ref 6.0–8.5)

## 2016-05-07 ENCOUNTER — Ambulatory Visit (INDEPENDENT_AMBULATORY_CARE_PROVIDER_SITE_OTHER): Payer: Medicare Other | Admitting: Internal Medicine

## 2016-05-07 ENCOUNTER — Encounter: Payer: Self-pay | Admitting: Internal Medicine

## 2016-05-07 VITALS — BP 122/70 | HR 66 | Temp 98.0°F | Ht 69.0 in | Wt 191.0 lb

## 2016-05-07 DIAGNOSIS — G4700 Insomnia, unspecified: Secondary | ICD-10-CM | POA: Diagnosis not present

## 2016-05-07 DIAGNOSIS — M6208 Separation of muscle (nontraumatic), other site: Secondary | ICD-10-CM | POA: Diagnosis not present

## 2016-05-07 DIAGNOSIS — R630 Anorexia: Secondary | ICD-10-CM

## 2016-05-07 DIAGNOSIS — R1011 Right upper quadrant pain: Secondary | ICD-10-CM | POA: Insufficient documentation

## 2016-05-07 DIAGNOSIS — I25701 Atherosclerosis of coronary artery bypass graft(s), unspecified, with angina pectoris with documented spasm: Secondary | ICD-10-CM | POA: Diagnosis not present

## 2016-05-07 DIAGNOSIS — R5382 Chronic fatigue, unspecified: Secondary | ICD-10-CM | POA: Diagnosis not present

## 2016-05-07 DIAGNOSIS — I10 Essential (primary) hypertension: Secondary | ICD-10-CM | POA: Diagnosis not present

## 2016-05-07 DIAGNOSIS — F411 Generalized anxiety disorder: Secondary | ICD-10-CM

## 2016-05-07 DIAGNOSIS — E785 Hyperlipidemia, unspecified: Secondary | ICD-10-CM | POA: Diagnosis not present

## 2016-05-07 HISTORY — DX: Separation of muscle (nontraumatic), other site: M62.08

## 2016-05-07 MED ORDER — AMITRIPTYLINE HCL 25 MG PO TABS
ORAL_TABLET | ORAL | Status: DC
Start: 2016-05-07 — End: 2017-03-25

## 2016-05-07 MED ORDER — ALPRAZOLAM 0.5 MG PO TABS: ORAL_TABLET | ORAL | Status: DC

## 2016-05-07 NOTE — Progress Notes (Signed)
Patient ID: Alexander [name], male   DOB: [birth year], 80 y.o.   MRN: [id number]    Facility  [facility]    Place of Service:   OFFICE    Allergies  Allergen Reactions  . Heparin     Blood clots   . Penicillin G Shortness Of Breath  . Levaquin [Levofloxacin In D5w] Other (See Comments)    Patient felt jittery and nervous with insomnia on the medication.  . Aspirin     Breathing complications, can tolerate baby asa  . Codeine     Some is ok  . Oxycodone     Can not take percocet, palpitations  . Sucralfate     Could not sleep  . Zetia [Ezetimibe]     jittery  . Zocor [Simvastatin]     Liver enzymes increased  . Plavix [Clopidogrel Bisulfate] Palpitations    Chief Complaint  Patient presents with  . Medical Management of Chronic Issues    6 month follow-up, discuss labs (copy printed)   . Sleeping Problem    Patient c/o problems staying asleep  . Anorexia    Patient c/o loss of appetite x 1 year (getting worse)  . Raised Area    Patient c/o raised area on the right side of stomach (near gallbladder removal scar)     HPI:  Anxiety state - chronic. Mild obsessive characteristics  Anorexia - denies nausea. Poor appetite. Weight is the same as it was in 2014.  Insomnia - chronic. Alternates alprazolam and diazepam.  Essential hypertension - controlled  Hyperlipidemia - controlled  Coronary artery disease involving coronary bypass graft of native heart with angina pectoris with documented spasm (HCC) - asymptomatic. No angina or palpitations.  Chronic fatigue - he thinks this may be related to poor sleep  RUQ pain - presistent and of variable intensity. Bartholome Bill had cholecystectomy.  Diastasis recti - he is worried about painless enlarging of the space between the rectus abdominii muscles.  Medications: Patient's Medications  New Prescriptions   No medications on file  Previous Medications   ALBUTEROL (PROVENTIL HFA;VENTOLIN HFA) 108 (90 BASE) MCG/ACT INHALER    Inhale 2  puffs into the lungs every 6 (six) hours as needed for wheezing.   AMITRIPTYLINE (ELAVIL) 25 MG TABLET    Take 6.25 mg by mouth at bedtime. Reported on 03/14/2016   ASPIRIN 81 MG TABLET    Take 81 mg by mouth 3 (three) times a week. Mondays, wednesdays and fridays   CHOLECALCIFEROL (VITAMIN D) 1000 UNITS TABLET    Take 1,000 Units by mouth daily.   DIAZEPAM (VALIUM) 5 MG TABLET    Take one or two tablets by mouth to help rest   FLAXSEED, LINSEED, 1000 MG CAPS    Take 1 capsule by mouth daily. Reported on 03/14/2016   OMEPRAZOLE (PRILOSEC) 20 MG CAPSULE    Take 1 capsule (20 mg total) by mouth daily.   ROSUVASTATIN (CRESTOR) 10 MG TABLET    Take 1 tablet (10 mg total) by mouth daily.  Modified Medications   Modified Medication Previous Medication   ALPRAZOLAM (XANAX) 0.5 MG TABLET ALPRAZolam (XANAX) 0.5 MG tablet      TAKE 1 TO 2 TABLETS BY MOUTH DAILY TO HELP ANXIETY    TAKE 1 TO 2 TABLETS BY MOUTH DAILY TO HELP ANXIETY  Discontinued Medications   No medications on file    Review of Systems  Constitutional: Negative for fever, chills, activity change, appetite change, fatigue and unexpected weight change.  HENT: Positive for hearing loss. Negative for congestion and ear pain.   Eyes: Negative.   Respiratory: Negative.  Negative for choking, chest tightness and shortness of breath.   Cardiovascular: Negative.  Negative for chest pain, palpitations and leg swelling.  Gastrointestinal: Negative for nausea and diarrhea.       Cholecystectomy in Dec 2014.  Endocrine: Negative.   Genitourinary: Negative.   Musculoskeletal:       Tender at right leg laterally and of the right foot since Feb 2016.  Skin: Negative.   Allergic/Immunologic: Negative.   Neurological: Negative.   Hematological: Negative.   Psychiatric/Behavioral: Negative.     Filed Vitals:   05/07/16 1357  BP: 122/70  Pulse: 66  Temp: 98 F (36.7 C)  TempSrc: Oral  Height: '5\' 9"'  (1.753 m)  Weight: 191 lb (86.637 kg)    SpO2: 96%   Body mass index is 28.19 kg/(m^2). Filed Weights   05/07/16 1357  Weight: 191 lb (86.637 kg)     Physical Exam  Constitutional: He appears well-developed and well-nourished. No distress.  HENT:  Head: Normocephalic and atraumatic.  Right Ear: External ear normal.  Left Ear: External ear normal.  Nose: Nose normal.  Mouth/Throat: Oropharynx is clear and moist.  Dentures. Missing some teeth.  Eyes: Conjunctivae and EOM are normal. Pupils are equal, round, and reactive to light.  Neck: No JVD present. No tracheal deviation present. No thyromegaly present.  Cardiovascular: Normal rate, regular rhythm, normal heart sounds and intact distal pulses.  Exam reveals no gallop and no friction rub.   No murmur heard. Pulmonary/Chest: No respiratory distress. He has no wheezes. He has no rales. He exhibits no tenderness.  Abdominal: He exhibits no distension and no mass. There is no tenderness. There is no rebound and no guarding. No hernia.  post laparoscopic cholecystectomy scars.  Mild diastasis recti.  Genitourinary:  Enlarged prostate noted on prior exam.  Musculoskeletal: He exhibits no edema or tenderness (anterior to the right lateral malleolus has resolved).  Neurological: He displays normal reflexes. No cranial nerve deficit. Coordination normal.  Skin: No rash noted. No erythema. No pallor.  Psychiatric: He has a normal mood and affect. His behavior is normal. Judgment and thought content normal.  Nursing note and vitals reviewed.   Labs reviewed: Lab Summary Latest Ref Rng 05/02/2016 03/05/2016 02/29/2016 02/27/2016 02/24/2016 10/30/2015  Hemoglobin 14.1 - 18.1 g/dL (None) 15.9 15.4 15.4 16.5 (None)  Hematocrit 43.5 - 53.7 % (None) 43.9 43.3(A) 43.6 46.3 (None)  White count 4.6 - 10.2 K/uL (None) 6.7 4.1(A) 3.4(A) 6.7 (None)  Platelet count - (None) (None) (None) (None) (None) (None)  Sodium 134 - 144 mmol/L 143 (None) (None) (None) (None) 143  Potassium 3.5 - 5.2  mmol/L 5.2 (None) (None) (None) (None) 4.5  Calcium 8.6 - 10.2 mg/dL 9.5 (None) (None) (None) (None) 9.4  Phosphorus - (None) (None) (None) (None) (None) (None)  Creatinine 0.76 - 1.27 mg/dL 1.13 (None) (None) (None) (None) 1.03  AST 0 - 40 IU/L 23 (None) (None) (None) (None) 31  Alk Phos 39 - 117 IU/L 50 (None) (None) (None) (None) 51  Bilirubin 0.0 - 1.2 mg/dL 0.9 (None) (None) (None) (None) 1.2  Glucose 65 - 99 mg/dL 99 (None) (None) (None) (None) 93  Cholesterol - (None) (None) (None) (None) (None) (None)  HDL cholesterol >39 mg/dL 56 (None) (None) (None) (None) 59  Triglycerides 0 - 149 mg/dL 69 (None) (None) (None) (None) 66  LDL Direct - (None) (None) (None) (None) (None) (  None)  LDL Calc 0 - 99 mg/dL 98 (None) (None) (None) (None) 99  Total protein - (None) (None) (None) (None) (None) (None)  Albumin 3.5 - 4.8 g/dL 4.1 (None) (None) (None) (None) 3.9   Lab Results  Component Value Date   TSH 3.340 10/17/2014   TSH 2.20 09/12/2014   TSH 2.039 01/29/2011   Lab Results  Component Value Date   BUN 13 05/02/2016   BUN 19 10/30/2015   BUN 23 01/23/2015   Lab Results  Component Value Date   HGBA1C * 01/17/2011    6.0 (NOTE)                                                                       According to the ADA Clinical Practice Recommendations for 2011, when HbA1c is used as a screening test:   >=6.5%   Diagnostic of Diabetes Mellitus           (if abnormal result  is confirmed)  5.7-6.4%   Increased risk of developing Diabetes Mellitus  References:Diagnosis and Classification of Diabetes Mellitus,Diabetes WPYK,9983,38(SNKNL 1):S62-S69 and Standards of Medical Care in         Diabetes - 2011,Diabetes Care,2011,34  (Suppl 1):S11-S61.    Assessment/Plan  1. Anxiety state - ALPRAZolam (XANAX) 0.5 MG tablet; TAKE 1 TO 2 TABLETS BY MOUTH DAILY TO HELP ANXIETY  Dispense: 60 tablet; Refill: 1  2. Anorexia obsereve  3. Insomnia - amitriptyline (ELAVIL) 25 MG tablet; Take 1/2  tablet at bedtime for rest and to relax muscles  Dispense: 90 tablet; Refill: 1  4. Essential hypertension Controlled  5. Hyperlipidemia controlled  6. Coronary artery disease involving coronary bypass graft of native heart with angina pectoris with documented spasm (HCC) stable  7. Chronic fatigue Possibly related to sleep problems  8. RUQ pain - CT LIVER ABD W/WO; Future  9. Diastasis recti Patient reassured this is common and benign

## 2016-05-09 NOTE — Addendum Note (Signed)
Addended by: Rafael Bihari A on: 05/09/2016 02:43 PM   Modules accepted: Orders

## 2016-05-12 ENCOUNTER — Ambulatory Visit (INDEPENDENT_AMBULATORY_CARE_PROVIDER_SITE_OTHER): Payer: Medicare Other | Admitting: Pulmonary Disease

## 2016-05-12 ENCOUNTER — Encounter: Payer: Self-pay | Admitting: Pulmonary Disease

## 2016-05-12 VITALS — BP 128/66 | HR 73 | Ht 69.5 in | Wt 192.8 lb

## 2016-05-12 DIAGNOSIS — J309 Allergic rhinitis, unspecified: Secondary | ICD-10-CM | POA: Diagnosis not present

## 2016-05-12 DIAGNOSIS — J4520 Mild intermittent asthma, uncomplicated: Secondary | ICD-10-CM

## 2016-05-12 NOTE — Patient Instructions (Signed)
   You can try using Xopenex in place of Albuterol intermittently to help with your breathing.  Call me if you have any new breathing problems or questions before your next appointment.  I will see you back in 1 year or sooner if needed.  TESTS ORDERED: 1. Full pulmonary function testing at follow-up appointment

## 2016-05-12 NOTE — Progress Notes (Signed)
Subjective:    Patient ID: Alexander [name], male    DOB: 1936-11-17, 80 y.o.   MRN: [id number]  C.C.:  Follow-up for Extrinsic Asthma, Allergic Rhinitis, & Recurrent Respiratory Infections.  HPI Extrinsic Asthma: He reports no new breathing problems since his last appointment. Denies any cough except with post-nasal drainage. No wheezing. He uses his rescue inhaler at most 1-2 times per week. No nocturnal awakenings with breathing problems. He reports previously he tried Xopenex without as much tremors and adverse effect but isn't sure it helped his breathing as much as regular albuterol.  Allergic Rhinitis:  Followed by ENT. He was started on a steroid nasal spray. Still having some post-nasal drainage.   Recurrent Respiratory Infections: No evidence of Ig deficiency on serum testing. No antibiotics since last appointment.  Review of Systems He reports a "tightness" in his chest with exercise/exertion. No other chest pain or pressure. No fever, chills, or sweats.   Allergies  Allergen Reactions  . Heparin     Blood clots   . Penicillin G Shortness Of Breath  . Levaquin [Levofloxacin In D5w] Other (See Comments)    Patient felt jittery and nervous with insomnia on the medication.  . Aspirin     Breathing complications, can tolerate baby asa  . Codeine     Some is ok  . Oxycodone     Can not take percocet, palpitations  . Sucralfate     Could not sleep  . Zetia [Ezetimibe]     jittery  . Zocor [Simvastatin]     Liver enzymes increased  . Plavix [Clopidogrel Bisulfate] Palpitations    Current Outpatient Prescriptions on File Prior to Visit  Medication Sig Dispense Refill  . albuterol (PROVENTIL HFA;VENTOLIN HFA) 108 (90 BASE) MCG/ACT inhaler Inhale 2 puffs into the lungs every 6 (six) hours as needed for wheezing. 1 Inhaler 0  . ALPRAZolam (XANAX) 0.5 MG tablet TAKE 1 TO 2 TABLETS BY MOUTH DAILY TO HELP ANXIETY 60 tablet 1  . amitriptyline (ELAVIL) 25 MG tablet Take 1/2  tablet at bedtime for rest and to relax muscles 90 tablet 1  . aspirin 81 MG tablet Take 81 mg by mouth 3 (three) times a week. Mondays, wednesdays and fridays    . cholecalciferol (VITAMIN D) 1000 UNITS tablet Take 1,000 Units by mouth daily.    . diazepam (VALIUM) 5 MG tablet Take one or two tablets by mouth to help rest 60 tablet 5  . omeprazole (PRILOSEC) 20 MG capsule Take 1 capsule (20 mg total) by mouth daily. (Patient taking differently: Take 20 mg by mouth daily as needed. ) 90 capsule 3  . rosuvastatin (CRESTOR) 10 MG tablet Take 1 tablet (10 mg total) by mouth daily. 30 tablet 11   No current facility-administered medications on file prior to visit.    Past Medical History  Diagnosis Date  . Pneumonia   . Pulmonary embolism ([place]) 2012    s/p heart surgery  . Hyperlipidemia   . Dysuria   . Clotting disorder ([place])   . Difficulty urinating   . Rheumatic fever age 32  . History of anal fissures   . Anxiety   . Diverticulosis of colon (without mention of hemorrhage)     mild  . Internal hemorrhoids without mention of complication   . Allergic rhinitis due to pollen   . History of cholelithiasis   . Edema   . Nonspecific elevation of levels of transaminase or lactic acid dehydrogenase (LDH)   .  Long term (current) use of anticoagulants   . Other voice and resonance disorders     after surgery, took breathing tube out and damaged something  . Coronary atherosclerosis of native coronary artery     with CABG 3/12  -MAZE procedure   . Hypertrophy of prostate with urinary obstruction and other lower urinary tract symptoms (LUTS)   . Impotence of organic origin   . Unspecified constipation   . Atrial fibrillation (HCC)     hx of  . Heart attack ([place]) 2012  . Anginal pain ([place])     once in a while, none recent  . Sleep apnea     no CPAP  . Asthma     exercise induced  . Skin cancer     Hx of squamous cell x2  . Basal cell carcinoma of skin of lip   . Jaundice age 52  .  Essential hypertension, benign   . Pulmonary embolism ([place])     3/12  . 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 Anxiety disorder   . Helicobacter pylori (H. pylori)   . Diastasis recti 05/07/2016    Past Surgical History  Procedure Laterality Date  . Hernia repair  2010    bilateral  . Coronary artery bypass graft  Febuary 13, 2012    full maze   . Scrotum exploration  1990's    multiple  . Mohs surgery      squamus x2  . Vasectomy  04/1992  . Superficial skin cystectomy (l) hip Right 1997    dr [name]  . Hydrocele excision      dr [name]   . Spermatocelectomy  02/1999    Right, Dr.[name]  . Cholecystectomy N/A 11/18/2013    Procedure: LAPAROSCOPIC CHOLECYSTECTOMY With IOC;  Surgeon: Odis Hollingshead, MD;  Location: [facility];  Service: General;  Laterality: N/A;    Family History  Problem Relation Age of Onset  . Bone cancer Mother   . Diabetes Mother   . Colon cancer Neg Hx   . Esophageal cancer Neg Hx   . Rectal cancer Neg Hx   . Stomach cancer Neg Hx   . Lung disease Neg Hx     Social History   Social History  . Marital Status: Married    Spouse Name: N/A  . Number of Children: 1  . Years of Education: N/A   Occupational History  . [employer]   Social History Main Topics  . Smoking status: Former Smoker -- 1.00 packs/day for 22 years    Types: Cigarettes    Start date: 08/22/1956    Quit date: 12/08/1977  . Smokeless tobacco: Never Used     Comment: significant second-hand smoke exposure at work  . Alcohol Use: 0.0 oz/week    0 Standard drinks or equivalent per week     Comment: former heavy alcohol use, rare use  . Drug Use: No  . Sexual Activity:    Partners: Female   Other Topics Concern  . None   Social History Narrative   Daily caffeine       [name]   [name]   [name]   [name]   [name]    [name]    [name]   Dr.Simonds-Pulmonologist   Dr.Rosenbower-General Surgeon   [name]-Cardiac Surgeon   Dr.Granfortuna-Hematologist    Dr.Ramaswamy-Pulmonary   Dr.Chris Newman-ENT       [facility]:   Reports  her previously worked in [employer], [employer] with significant second-hand smoked exposure. No pets currently. No hot tub or bird exposure.        Objective:   Physical Exam BP 128/66 mmHg  Pulse 73  Ht 5' 9.5" (1.765 m)  Wt 192 lb 12.8 oz (87.454 kg)  BMI 28.07 kg/m2  SpO2 95% General:  Awake. Alert. No distress.   Integument:  Warm & dry. No rash on exposed skin.  HEENT:  Moist mucus membranes. No oral ulcers Mild bilateral nasal turbinate swelling.Minimal nasal turbinate swelling bilaterally. Cardiovascular:  Regular rate. No edema. Normal S1 & S2. Pulmonary:  Good aeration & clear to auscultation bilaterally. Normal work of breathing on room air. Abdomen: Soft. Normal bowel sounds. Nontender.  PFT 04/30/16: FVC 4.49 L (111%) FEV1 3.16 L (124%) FEV1/FVC 0.81 FEF 25-75 3.57 L (174%) no bronchodilator response TLC 8.35 L (120%) RV 122% ERV 9% DLCO corrected 79% 10/12/14: FVC 4.42 L (105%) FEV1 3.55 L (111%) FEV1/FVC 0.76 FEF 25-75 3.60 L (128%)                                        TLC 6.77 L (99%) RV 78% ERV 22% DLCO uncorrected 122%  IMAGING CXR PA/LAT 03/05/16 (previously reviewed by me): No parenchymal opacity or nodule appreciated. No pleural effusion or thickening. Mild hyperinflation with flattening of the diaphragms and deep sulci bilaterally.  SINUS X-RAY 2 VIEW 03/05/16 (per radiologist): No acute or chronic paranasal sinus inflammation.  LABS 03/14/16 DG: 1016 IgA: 327 IgM: 87  03/05/16 CBC: 6.7/15.9/43.9/307    Assessment & Plan:   80 year old male with recent recurrent upper respiratory infections. Patient seems to have recovered well. Spirometry shows no evidence of airway obstruction or significant bronchodilator response. His carbon  monoxide diffusion capacity and lung volumes are both normal as well. I suspect given the frequency of use of his rescue inhaler he has mild, intermittent extrinsic asthma. We did discuss using Xopenex in place of albuterol to help minimize the adverse effects of the medication. He is also started on a nasal steroid prescribed by ENT. I instructed the patient to contact my office if he had any new breathing problems or questions before his next appointment.  1. Extrinsic asthma: He should to continue using albuterol/Xopenex inhaler as needed. Checking full pulmonary function testing at follow-up appointment. 2. Allergic rhinitis: Continuing nasal steroid medication prescribed by ENT. 3. Health maintenance: Patient received Prevnar vaccine October 2015. 4. Follow-up: Return to clinic in 1 year or sooner if needed.  Sonia Baller Ashok Cordia, M.D. [facility] Pager:  [phone] After 3pm or if no response, call [phone] 11:07 AM 05/12/2016

## 2016-05-15 ENCOUNTER — Ambulatory Visit
Admission: RE | Admit: 2016-05-15 | Discharge: 2016-05-15 | Disposition: A | Discharge status: Home / Self Care | Payer: Medicare Other | Source: Ambulatory Visit | Attending: Internal Medicine | Admitting: Internal Medicine

## 2016-05-15 DIAGNOSIS — R1011 Right upper quadrant pain: Secondary | ICD-10-CM

## 2016-05-15 DIAGNOSIS — K7689 Other specified diseases of liver: Secondary | ICD-10-CM | POA: Diagnosis not present

## 2016-05-15 MED ORDER — IOPAMIDOL (ISOVUE-300) INJECTION 61%
100.0000 mL | Freq: Once | INTRAVENOUS | Status: AC | PRN
  Administered 2016-05-15: 100 mL via INTRAVENOUS

## 2016-05-20 ENCOUNTER — Other Ambulatory Visit: Payer: Self-pay

## 2016-05-20 ENCOUNTER — Telehealth: Payer: Self-pay | Admitting: *Deleted

## 2016-05-20 DIAGNOSIS — K769 Liver disease, unspecified: Secondary | ICD-10-CM

## 2016-05-20 DIAGNOSIS — R935 Abnormal findings on diagnostic imaging of other abdominal regions, including retroperitoneum: Secondary | ICD-10-CM

## 2016-05-20 NOTE — Telephone Encounter (Signed)
Patient called and wanted to know if he has the MRI showed new lesions. I read to him Dr. Hervey Ard impression from the Scan. Patient agreed to the referral and will make an appointment to follow up with Dr. Nyoka Cowden afterwards.

## 2016-05-21 DIAGNOSIS — L720 Epidermal cyst: Secondary | ICD-10-CM | POA: Diagnosis not present

## 2016-05-21 DIAGNOSIS — L821 Other seborrheic keratosis: Secondary | ICD-10-CM | POA: Diagnosis not present

## 2016-05-21 DIAGNOSIS — C44311 Basal cell carcinoma of skin of nose: Secondary | ICD-10-CM | POA: Diagnosis not present

## 2016-05-29 ENCOUNTER — Ambulatory Visit
Admission: RE | Admit: 2016-05-29 | Discharge: 2016-05-29 | Disposition: A | Discharge status: Home / Self Care | Payer: Medicare Other | Source: Ambulatory Visit | Attending: Internal Medicine | Admitting: Internal Medicine

## 2016-05-29 DIAGNOSIS — R935 Abnormal findings on diagnostic imaging of other abdominal regions, including retroperitoneum: Secondary | ICD-10-CM

## 2016-05-29 DIAGNOSIS — K7689 Other specified diseases of liver: Secondary | ICD-10-CM | POA: Diagnosis not present

## 2016-05-29 DIAGNOSIS — K769 Liver disease, unspecified: Secondary | ICD-10-CM

## 2016-05-29 MED ORDER — GADOBENATE DIMEGLUMINE 529 MG/ML IV SOLN
17.0000 mL | Freq: Once | INTRAVENOUS | Status: AC | PRN
  Administered 2016-05-29: 17 mL via INTRAVENOUS

## 2016-05-30 ENCOUNTER — Encounter: Payer: Self-pay | Admitting: *Deleted

## 2016-06-04 ENCOUNTER — Encounter: Payer: Self-pay | Admitting: Internal Medicine

## 2016-06-04 ENCOUNTER — Ambulatory Visit (INDEPENDENT_AMBULATORY_CARE_PROVIDER_SITE_OTHER): Payer: Medicare Other | Admitting: Internal Medicine

## 2016-06-04 VITALS — BP 136/80 | HR 74 | Temp 98.0°F | Ht 70.0 in | Wt 193.0 lb

## 2016-06-04 DIAGNOSIS — C4491 Basal cell carcinoma of skin, unspecified: Secondary | ICD-10-CM | POA: Diagnosis not present

## 2016-06-04 DIAGNOSIS — K862 Cyst of pancreas: Secondary | ICD-10-CM | POA: Diagnosis not present

## 2016-06-04 NOTE — Progress Notes (Signed)
Patient ID: Alexander [name], male   DOB: 1936-11-20, 80 y.o.   MRN: [id number]    Facility  [facility]    Place of Service:   OFFICE    Allergies  Allergen Reactions  . Heparin     Blood clots   . Penicillin G Shortness Of Breath  . Levaquin [Levofloxacin In D5w] Other (See Comments)    Patient felt jittery and nervous with insomnia on the medication.  . Aspirin     Breathing complications, can tolerate baby asa  . Codeine     Some is ok  . Oxycodone     Can not take percocet, palpitations  . Sucralfate     Could not sleep  . Zetia [Ezetimibe]     jittery  . Zocor [Simvastatin]     Liver enzymes increased  . Plavix [Clopidogrel Bisulfate] Palpitations    Chief Complaint  Patient presents with  . Medical Management of Chronic Issues    review MRI and CT of abdomen.  Here with wife    HPI:  Patient was previously seen with complaints of right upper quadrant discomfort. CT scan of the abdomen showed possible hepatic cyst. It was suggested he have an MRI protocol of the liver and this was done. Results are noted below.  05/29/16 MRI abd: 1. Numerous small benign-appearing hepatic cysts.  2. 9 mm pancreatic cyst is likely benign. I would recommend a 1 year followup MR abdomen without and with contrast to assure stability.  3. No acute abdominal findings, mass lesions or adenopathy.  Basal cell cancer left side of the nose. Scheduled for Moh's on June 16, 2016.  Today, patient states that the right upper quadrant discomfort has disappeared. He has an unusual fluttering sensation in the left upper quadrant from time to time now. There is no nausea, weight loss, or abdominal pain. Note the new finding on the MRI of 9 mm pancreatic cyst.  Medications: Patient's Medications  New Prescriptions   No medications on file  Previous Medications   ALBUTEROL (PROVENTIL HFA;VENTOLIN HFA) 108 (90 BASE) MCG/ACT INHALER    Inhale 2 puffs into the lungs every 6 (six) hours as needed for  wheezing.   ALPRAZOLAM (XANAX) 0.5 MG TABLET    TAKE 1 TO 2 TABLETS BY MOUTH DAILY TO HELP ANXIETY   AMITRIPTYLINE (ELAVIL) 25 MG TABLET    Take 1/2 tablet at bedtime for rest and to relax muscles   ASPIRIN 81 MG TABLET    Take 81 mg by mouth 3 (three) times a week. Mondays, wednesdays and fridays   CHOLECALCIFEROL (VITAMIN D) 1000 UNITS TABLET    Take 1,000 Units by mouth daily.   DIAZEPAM (VALIUM) 5 MG TABLET    Take one or two tablets by mouth to help rest   OMEPRAZOLE (PRILOSEC) 20 MG CAPSULE    Take 1 capsule (20 mg total) by mouth daily.   ROSUVASTATIN (CRESTOR) 10 MG TABLET    Take 1 tablet (10 mg total) by mouth daily.  Modified Medications   No medications on file  Discontinued Medications   No medications on file    Review of Systems  Constitutional: Negative for fever, chills, activity change, appetite change, fatigue and unexpected weight change.  HENT: Positive for hearing loss. Negative for congestion and ear pain.   Eyes: Negative.   Respiratory: Negative.  Negative for choking, chest tightness and shortness of breath.   Cardiovascular: Negative.  Negative for chest pain, palpitations and leg swelling.  Gastrointestinal:  Negative for nausea and diarrhea.       Cholecystectomy in Dec 2014.  Endocrine: Negative.   Genitourinary: Negative.   Musculoskeletal:       Tender at right leg laterally and of the right foot since Feb 2016.  Skin: Negative.   Allergic/Immunologic: Negative.   Neurological: Negative.   Hematological: Negative.   Psychiatric/Behavioral: Negative.     Filed Vitals:   06/04/16 1451  BP: 136/80  Pulse: 74  Temp: 98 F (36.7 C)  TempSrc: Oral  Height: '5\' 10"'  (1.778 m)  Weight: 193 lb (87.544 kg)  SpO2: 96%   Body mass index is 27.69 kg/(m^2). Filed Weights   06/04/16 1451  Weight: 193 lb (87.544 kg)     Physical Exam  Constitutional: He appears well-developed and well-nourished. No distress.  HENT:  Head: Normocephalic and  atraumatic.  Right Ear: External ear normal.  Left Ear: External ear normal.  Nose: Nose normal.  Mouth/Throat: Oropharynx is clear and moist.  Dentures. Missing some teeth.  Eyes: Conjunctivae and EOM are normal. Pupils are equal, round, and reactive to light.  Neck: No JVD present. No tracheal deviation present. No thyromegaly present.  Cardiovascular: Normal rate, regular rhythm, normal heart sounds and intact distal pulses.  Exam reveals no gallop and no friction rub.   No murmur heard. Pulmonary/Chest: No respiratory distress. He has no wheezes. He has no rales. He exhibits no tenderness.  Abdominal: He exhibits no distension and no mass. There is no tenderness. There is no rebound and no guarding. No hernia.  post laparoscopic cholecystectomy scars.  Mild diastasis recti.  Genitourinary:  Enlarged prostate noted on prior exam.  Musculoskeletal: He exhibits no edema or tenderness (anterior to the right lateral malleolus has resolved).  Neurological: He displays normal reflexes. No cranial nerve deficit. Coordination normal.  Skin: No rash noted. No erythema. No pallor.  Psychiatric: He has a normal mood and affect. His behavior is normal. Judgment and thought content normal.  Nursing note and vitals reviewed.   Labs reviewed: Lab Summary Latest Ref Rng 05/02/2016 03/05/2016 02/29/2016 02/27/2016 02/24/2016 10/30/2015  Hemoglobin 14.1 - 18.1 g/dL (None) 15.9 15.4 15.4 16.5 (None)  Hematocrit 43.5 - 53.7 % (None) 43.9 43.3(A) 43.6 46.3 (None)  White count 4.6 - 10.2 K/uL (None) 6.7 4.1(A) 3.4(A) 6.7 (None)  Platelet count - (None) (None) (None) (None) (None) (None)  Sodium 134 - 144 mmol/L 143 (None) (None) (None) (None) 143  Potassium 3.5 - 5.2 mmol/L 5.2 (None) (None) (None) (None) 4.5  Calcium 8.6 - 10.2 mg/dL 9.5 (None) (None) (None) (None) 9.4  Phosphorus - (None) (None) (None) (None) (None) (None)  Creatinine 0.76 - 1.27 mg/dL 1.13 (None) (None) (None) (None) 1.03  AST 0 - 40  IU/L 23 (None) (None) (None) (None) 31  Alk Phos 39 - 117 IU/L 50 (None) (None) (None) (None) 51  Bilirubin 0.0 - 1.2 mg/dL 0.9 (None) (None) (None) (None) 1.2  Glucose 65 - 99 mg/dL 99 (None) (None) (None) (None) 93  Cholesterol - (None) (None) (None) (None) (None) (None)  HDL cholesterol >39 mg/dL 56 (None) (None) (None) (None) 59  Triglycerides 0 - 149 mg/dL 69 (None) (None) (None) (None) 66  LDL Direct - (None) (None) (None) (None) (None) (None)  LDL Calc 0 - 99 mg/dL 98 (None) (None) (None) (None) 99  Total protein - (None) (None) (None) (None) (None) (None)  Albumin 3.5 - 4.8 g/dL 4.1 (None) (None) (None) (None) 3.9   Lab Results  Component Value Date  TSH 3.340 10/17/2014   TSH 2.20 09/12/2014   TSH 2.039 01/29/2011   Lab Results  Component Value Date   BUN 13 05/02/2016   BUN 19 10/30/2015   BUN 23 01/23/2015   Lab Results  Component Value Date   HGBA1C * 01/17/2011    6.0 (NOTE)                                                                       According to the ADA Clinical Practice Recommendations for 2011, when HbA1c is used as a screening test:   >=6.5%   Diagnostic of Diabetes Mellitus           (if abnormal result  is confirmed)  5.7-6.4%   Increased risk of developing Diabetes Mellitus  References:Diagnosis and Classification of Diabetes Mellitus,Diabetes ZOXW,9604,54(UJWJX 1):S62-S69 and Standards of Medical Care in         Diabetes - 2011,Diabetes Care,2011,34  (Suppl 1):S11-S61.   Mr Abdomen W Wo Contrast  05/29/2016  CLINICAL DATA:  Followup abnormal CT scan. Multiple small liver lesions noted. EXAM: MRI ABDOMEN WITHOUT AND WITH CONTRAST TECHNIQUE: Multiplanar multisequence MR imaging of the abdomen was performed both before and after the administration of intravenous contrast. CONTRAST:  59m MULTIHANCE GADOBENATE DIMEGLUMINE 529 MG/ML IV SOLN COMPARISON:  CT scan 05/15/2016 and 09/13/2014 FINDINGS: Lower chest: The lung bases are grossly clear. No pulmonary  lesions for pleural effusion. Normal heart size. No pericardial effusion. Hepatobiliary: There are numerous small benign-appearing hepatic cysts. These demonstrate bright T2 signal intensity and lack of contrast enhancement. In retrospect I think these are stable when compared to prior CT scan from 2015. No worrisome hepatic lesions. No intrahepatic biliary dilatation. Normal caliber and course of the common bile duct. The gallbladder is surgically absent. Pancreas: Mild pancreatic atrophy. No inflammation or ductal dilatation. There is a small cystic lesion in the body/tail junction region which measures 9 mm. This appears stable when compared to prior CT scan. This is most likely a benign postinflammatory cyst. A side-branch IPMN is possible. Spleen: Normal size.  No focal lesions. Adrenals/Urinary Tract: The adrenal glands and kidneys are unremarkable. Stomach/Bowel: The stomach, duodenum, visualized small bowel and visualize colon are unremarkable. Vascular/Lymphatic: No mesenteric or retroperitoneal mass or lymphadenopathy. There is a benign-appearing mesenteric cyst or peritoneal inclusion cyst. The aorta is normal in caliber. The branch vessels are patent. The major venous structures are patent. Other: No ascites or abdominal wall hernia. Musculoskeletal: No significant bony findings.  IMPRESSION:  1. Numerous small benign-appearing hepatic cysts.  2. 9 mm pancreatic cyst is likely benign. I would recommend a 1 year followup MR abdomen without and with contrast to assure stability.  3. No acute abdominal findings, mass lesions or adenopathy.   Electronically Signed   By: PMarijo SanesM.D.   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 I do not feel there is anything clinically that would make me more suspicious. I advised patient that we will continue to keep the assisted mind on future visits. He is to call if he becomes nauseous, has abdominal pain, or  begins losing weight unintentionally. He has a return visit scheduled in October 2017.  BCC (  basal cell carcinoma of skin) - patient will go for Mohs surgery as scheduled

## 2016-06-16 DIAGNOSIS — C44311 Basal cell carcinoma of skin of nose: Secondary | ICD-10-CM | POA: Diagnosis not present

## 2016-07-07 ENCOUNTER — Telehealth: Payer: Self-pay | Admitting: Internal Medicine

## 2016-07-07 NOTE — Telephone Encounter (Signed)
He should take it daily and see if helps. He previously had symptoms of dyspepsia, is this his typical symptoms. Otherwise, he has a benign appearing 70mm pancreatic cyst noted on imaging from June, doubt related but can check labs if symptoms persist (CBC, lipase, LFTs). Thanks

## 2016-07-07 NOTE — Telephone Encounter (Signed)
Spoke with patient and he reports he had an episode of upper abdominal pain that lasted 2-3 minutes. States it reminded him of gallbladder pain. States it still feels irritated. States he had a recent CT abdomen that was ok. He is not taking Omeprazole. Suggested he try this to see if helps.

## 2016-07-08 NOTE — Telephone Encounter (Signed)
Patient given results and recommendations. He will try Omeprazole and call back if it does not help.

## 2016-07-16 DIAGNOSIS — N401 Enlarged prostate with lower urinary tract symptoms: Secondary | ICD-10-CM | POA: Diagnosis not present

## 2016-07-16 DIAGNOSIS — R350 Frequency of micturition: Secondary | ICD-10-CM | POA: Diagnosis not present

## 2016-07-16 DIAGNOSIS — R3912 Poor urinary stream: Secondary | ICD-10-CM | POA: Diagnosis not present

## 2016-07-30 DIAGNOSIS — Z85828 Personal history of other malignant neoplasm of skin: Secondary | ICD-10-CM | POA: Diagnosis not present

## 2016-07-30 DIAGNOSIS — D692 Other nonthrombocytopenic purpura: Secondary | ICD-10-CM | POA: Diagnosis not present

## 2016-07-30 DIAGNOSIS — L821 Other seborrheic keratosis: Secondary | ICD-10-CM | POA: Diagnosis not present

## 2016-09-09 ENCOUNTER — Encounter: Payer: Self-pay | Admitting: Internal Medicine

## 2016-09-09 ENCOUNTER — Ambulatory Visit (INDEPENDENT_AMBULATORY_CARE_PROVIDER_SITE_OTHER): Payer: Medicare Other | Admitting: Internal Medicine

## 2016-09-09 VITALS — BP 116/70 | HR 67 | Temp 98.0°F | Ht 70.0 in | Wt 194.0 lb

## 2016-09-09 DIAGNOSIS — Z125 Encounter for screening for malignant neoplasm of prostate: Secondary | ICD-10-CM | POA: Insufficient documentation

## 2016-09-09 DIAGNOSIS — M48061 Spinal stenosis, lumbar region without neurogenic claudication: Secondary | ICD-10-CM

## 2016-09-09 DIAGNOSIS — E785 Hyperlipidemia, unspecified: Secondary | ICD-10-CM | POA: Diagnosis not present

## 2016-09-09 DIAGNOSIS — K862 Cyst of pancreas: Secondary | ICD-10-CM | POA: Diagnosis not present

## 2016-09-09 DIAGNOSIS — I10 Essential (primary) hypertension: Secondary | ICD-10-CM | POA: Diagnosis not present

## 2016-09-09 DIAGNOSIS — F411 Generalized anxiety disorder: Secondary | ICD-10-CM

## 2016-09-09 MED ORDER — DIAZEPAM 5 MG PO TABS: ORAL_TABLET | ORAL | 5 refills | Status: DC

## 2016-09-09 NOTE — Progress Notes (Addendum)
Facility  [facility]    Place of Service:   OFFICE    Allergies  Allergen Reactions  . Heparin     Blood clots   . Penicillin G Shortness Of Breath  . Levaquin [Levofloxacin In D5w] Other (See Comments)    Patient felt jittery and nervous with insomnia on the medication.  . Aspirin     Breathing complications, can tolerate baby asa  . Codeine     Some is ok  . Oxycodone     Can not take percocet, palpitations  . Sucralfate     Could not sleep  . Zetia [Ezetimibe]     jittery  . Zocor [Simvastatin]     Liver enzymes increased  . Plavix [Clopidogrel Bisulfate] Palpitations    Chief Complaint  Patient presents with  . Medical Management of Chronic Issues    4 month medication management pancreas cyst, anxiety    HPI:  Last seen 06/04/16 in follow up of MRI abd that showed a 9 mm pancreatic cyst. Had RUQ discomfort in the past. Pains are better. No nausea, heartburn , or reflux.   Has OSA and can't sleep. Could not tolerate CPAP.  Dr. [name] looked in the bladder in the office in August 2017.   Had skin cancers removed from around his eye by DR. Sarajane Jews.  Chronic back discomfort related to lumbar spinal stenosis. Has cut down on exercising.  Medications: Patient's Medications  New Prescriptions   No medications on file  Previous Medications   ALBUTEROL (PROVENTIL HFA;VENTOLIN HFA) 108 (90 BASE) MCG/ACT INHALER    Inhale 2 puffs into the lungs every 6 (six) hours as needed for wheezing.   ALPRAZOLAM (XANAX) 0.5 MG TABLET    TAKE 1 TO 2 TABLETS BY MOUTH DAILY TO HELP ANXIETY   AMITRIPTYLINE (ELAVIL) 25 MG TABLET    Take 1/2 tablet at bedtime for rest and to relax muscles   ASPIRIN 81 MG TABLET    Take 81 mg by mouth 3 (three) times a week. Mondays, wednesdays and fridays   CHOLECALCIFEROL (VITAMIN D) 1000 UNITS TABLET    Take 1,000 Units by mouth daily.   DIAZEPAM (VALIUM) 5 MG TABLET    Take one or two tablets by mouth to help rest   OMEPRAZOLE (PRILOSEC) 20 MG  CAPSULE    Take 1 capsule (20 mg total) by mouth daily.   ROSUVASTATIN (CRESTOR) 10 MG TABLET    Take 1 tablet (10 mg total) by mouth daily.  Modified Medications   No medications on file  Discontinued Medications   No medications on file    Review of Systems  Constitutional: Negative for activity change, appetite change, chills, fatigue, fever and unexpected weight change.  HENT: Positive for hearing loss. Negative for congestion and ear pain.   Eyes: Negative.   Respiratory: Negative.  Negative for choking, chest tightness and shortness of breath.   Cardiovascular: Negative for chest pain, palpitations and leg swelling.       Hx CAD and PAF  Gastrointestinal: Negative for diarrhea and nausea.       Cholecystectomy in Dec 2014.  Endocrine: Negative.   Genitourinary: Positive for frequency.       BPH  Musculoskeletal: Positive for back pain (hx lumbar stenosis).       Tender at right leg laterally and of the right foot since Feb 2016.  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  Allergic/Immunologic: Negative.   Neurological: Negative.   Hematological: Negative.   Psychiatric/Behavioral: Positive for dysphoric mood and sleep disturbance. The patient is nervous/anxious.     Vitals:   09/09/16 1619  BP: 116/70  Pulse: 67  Temp: 98 F (36.7 C)  TempSrc: Oral  SpO2: 96%  Weight: 194 lb (88 kg)  Height: '5\' 10"'  (1.778 m)   Body mass index is 27.84 kg/m. Wt Readings from Last 3 Encounters:  09/09/16 194 lb (88 kg)  06/04/16 193 lb (87.5 kg)  05/12/16 192 lb 12.8 oz (87.5 kg)      Physical Exam  Constitutional: He appears well-developed and well-nourished. No distress.  HENT:  Head: Normocephalic and atraumatic.  Right Ear: External ear normal.  Left Ear: External ear normal.  Nose: Nose normal.  Mouth/Throat: Oropharynx is clear and moist.  Dentures. Missing some teeth.  Eyes: Conjunctivae and EOM are normal. Pupils are equal,  round, and reactive to light.  Neck: No JVD present. No tracheal deviation present. No thyromegaly present.  Cardiovascular: Normal rate, regular rhythm, normal heart sounds and intact distal pulses.  Exam reveals no gallop and no friction rub.   No murmur heard. Pulmonary/Chest: No respiratory distress. He has no wheezes. He has no rales. He exhibits no tenderness.  Abdominal: He exhibits no distension and no mass. There is no tenderness. There is no rebound and no guarding. No hernia.  post laparoscopic cholecystectomy scars.  Mild diastasis recti.  Genitourinary:  Genitourinary Comments: Enlarged prostate noted on prior exam.  Musculoskeletal: He exhibits no edema or tenderness (anterior to the right lateral malleolus has resolved).  Neurological: He displays normal reflexes. No cranial nerve deficit. Coordination normal.  Skin: No rash noted. No erythema. No pallor.  Psychiatric: He has a normal mood and affect. His behavior is normal. Judgment and thought content normal.  Nursing note and vitals reviewed.   Labs reviewed: Lab Summary Latest Ref Rng & Units 05/02/2016 03/05/2016 02/29/2016 02/27/2016 02/24/2016 10/30/2015  Hemoglobin 14.1 - 18.1 g/dL (None) 15.9 15.4 15.4 16.5 (None)  Hematocrit 43.5 - 53.7 % (None) 43.9 43.3(A) 43.6 46.3 (None)  White count 4.6 - 10.2 K/uL (None) 6.7 4.1(A) 3.4(A) 6.7 (None)  Platelet count - (None) (None) (None) (None) (None) (None)  Sodium 134 - 144 mmol/L 143 (None) (None) (None) (None) 143  Potassium 3.5 - 5.2 mmol/L 5.2 (None) (None) (None) (None) 4.5  Calcium 8.6 - 10.2 mg/dL 9.5 (None) (None) (None) (None) 9.4  Phosphorus - (None) (None) (None) (None) (None) (None)  Creatinine 0.76 - 1.27 mg/dL 1.13 (None) (None) (None) (None) 1.03  AST 0 - 40 IU/L 23 (None) (None) (None) (None) 31  Alk Phos 39 - 117 IU/L 50 (None) (None) (None) (None) 51  Bilirubin 0.0 - 1.2 mg/dL 0.9 (None) (None) (None) (None) 1.2  Glucose 65 - 99 mg/dL 99 (None) (None)  (None) (None) 93  Cholesterol - (None) (None) (None) (None) (None) (None)  HDL cholesterol >39 mg/dL 56 (None) (None) (None) (None) 59  Triglycerides 0 - 149 mg/dL 69 (None) (None) (None) (None) 66  LDL Direct - (None) (None) (None) (None) (None) (None)  LDL Calc 0 - 99 mg/dL 98 (None) (None) (None) (None) 99  Total protein - (None) (None) (None) (None) (None) (None)  Albumin 3.5 - 4.8 g/dL 4.1 (None) (None) (None) (None) 3.9  Some recent data might be hidden   Lab Results  Component Value Date   TSH 3.340 10/17/2014   TSH 2.20 09/12/2014   TSH 2.039 01/29/2011  Lab Results  Component Value Date   BUN 13 05/02/2016   BUN 19 10/30/2015   BUN 23 01/23/2015   Lab Results  Component Value Date   HGBA1C (H) 01/17/2011    6.0 (NOTE)                                                                       According to the ADA Clinical Practice Recommendations for 2011, when HbA1c is used as a screening test:   >=6.5%   Diagnostic of Diabetes Mellitus           (if abnormal result  is confirmed)  5.7-6.4%   Increased risk of developing Diabetes Mellitus  References:Diagnosis and Classification of Diabetes Mellitus,Diabetes PRXY,5859,29(WKMQK 1):S62-S69 and Standards of Medical Care in         Diabetes - 2011,Diabetes Care,2011,34  (Suppl 1):S11-S61.    Assessment/Plan  1. Pancreas cyst Repeat MRI abd June 2018  2. Essential hypertension - CMP, future  3. Spinal stenosis of lumbar region without neurogenic claudication Leads to chronic back disc omfort  4. Anxiety state - diazepam (VALIUM) 5 MG tablet; Take one or two tablets by mouth to help rest  Dispense: 60 tablet; Refill: 5  5. Prostate cancer screening - PSA; Future  6. Hyperlipidemia, unspecified hyperlipidemia type - Lipid panel; Future

## 2016-09-09 NOTE — Addendum Note (Signed)
Addended by: Estill Dooms on: 09/09/2016 05:07 PM   Modules accepted: Orders

## 2016-09-12 ENCOUNTER — Ambulatory Visit: Payer: Medicare Other

## 2016-09-25 DIAGNOSIS — Z85828 Personal history of other malignant neoplasm of skin: Secondary | ICD-10-CM | POA: Diagnosis not present

## 2016-09-25 DIAGNOSIS — L821 Other seborrheic keratosis: Secondary | ICD-10-CM | POA: Diagnosis not present

## 2016-09-25 DIAGNOSIS — L720 Epidermal cyst: Secondary | ICD-10-CM | POA: Diagnosis not present

## 2016-10-07 ENCOUNTER — Other Ambulatory Visit: Payer: Medicare Other

## 2016-10-07 ENCOUNTER — Ambulatory Visit (INDEPENDENT_AMBULATORY_CARE_PROVIDER_SITE_OTHER): Payer: Medicare Other

## 2016-10-07 ENCOUNTER — Ambulatory Visit (INDEPENDENT_AMBULATORY_CARE_PROVIDER_SITE_OTHER): Payer: Medicare Other | Admitting: Internal Medicine

## 2016-10-07 ENCOUNTER — Encounter: Payer: Self-pay | Admitting: Internal Medicine

## 2016-10-07 VITALS — BP 118/66 | HR 68 | Temp 98.0°F | Ht 70.0 in | Wt 195.0 lb

## 2016-10-07 VITALS — BP 118/66 | HR 68 | Temp 98.0°F | Wt 195.4 lb

## 2016-10-07 DIAGNOSIS — E785 Hyperlipidemia, unspecified: Secondary | ICD-10-CM

## 2016-10-07 DIAGNOSIS — Z23 Encounter for immunization: Secondary | ICD-10-CM

## 2016-10-07 DIAGNOSIS — M6208 Separation of muscle (nontraumatic), other site: Secondary | ICD-10-CM | POA: Diagnosis not present

## 2016-10-07 DIAGNOSIS — I10 Essential (primary) hypertension: Secondary | ICD-10-CM

## 2016-10-07 DIAGNOSIS — F411 Generalized anxiety disorder: Secondary | ICD-10-CM

## 2016-10-07 DIAGNOSIS — C44311 Basal cell carcinoma of skin of nose: Secondary | ICD-10-CM | POA: Diagnosis not present

## 2016-10-07 DIAGNOSIS — S8391XD Sprain of unspecified site of right knee, subsequent encounter: Secondary | ICD-10-CM | POA: Diagnosis not present

## 2016-10-07 DIAGNOSIS — R630 Anorexia: Secondary | ICD-10-CM | POA: Diagnosis not present

## 2016-10-07 DIAGNOSIS — I25701 Atherosclerosis of coronary artery bypass graft(s), unspecified, with angina pectoris with documented spasm: Secondary | ICD-10-CM | POA: Diagnosis not present

## 2016-10-07 DIAGNOSIS — R062 Wheezing: Secondary | ICD-10-CM

## 2016-10-07 DIAGNOSIS — Z0000 Encounter for general adult medical examination without abnormal findings: Secondary | ICD-10-CM | POA: Diagnosis not present

## 2016-10-07 DIAGNOSIS — K862 Cyst of pancreas: Secondary | ICD-10-CM

## 2016-10-07 DIAGNOSIS — R739 Hyperglycemia, unspecified: Secondary | ICD-10-CM | POA: Insufficient documentation

## 2016-10-07 DIAGNOSIS — Z125 Encounter for screening for malignant neoplasm of prostate: Secondary | ICD-10-CM

## 2016-10-07 DIAGNOSIS — D709 Neutropenia, unspecified: Secondary | ICD-10-CM

## 2016-10-07 HISTORY — DX: Neutropenia, unspecified: D70.9

## 2016-10-07 LAB — CBC WITH DIFFERENTIAL/PLATELET
BASOS ABS: 0 {cells}/uL (ref 0–200)
Basophils Relative: 0 %
EOS PCT: 1 %
Eosinophils Absolute: 57 cells/uL (ref 15–500)
HCT: 48.7 % (ref 38.5–50.0)
HEMOGLOBIN: 16.4 g/dL (ref 13.2–17.1)
LYMPHS ABS: 1824 {cells}/uL (ref 850–3900)
Lymphocytes Relative: 32 %
MCH: 31.7 pg (ref 27.0–33.0)
MCHC: 33.7 g/dL (ref 32.0–36.0)
MCV: 94.2 fL (ref 80.0–100.0)
MPV: 8.8 fL (ref 7.5–12.5)
Monocytes Absolute: 456 cells/uL (ref 200–950)
Monocytes Relative: 8 %
NEUTROS PCT: 59 %
Neutro Abs: 3363 cells/uL (ref 1500–7800)
Platelets: 251 10*3/uL (ref 140–400)
RBC: 5.17 MIL/uL (ref 4.20–5.80)
RDW: 13.3 % (ref 11.0–15.0)
WBC: 5.7 10*3/uL (ref 3.8–10.8)

## 2016-10-07 NOTE — Progress Notes (Addendum)
HISTORY AND PHYSICAL  Location:    [place]   Place of Service:   OFFICE  Extended Emergency Contact Information Primary Emergency Contact: Cappiello,[name] Address: [place], [place] [address] [place] Phone: [phone] Work Phone: [phone] Relation: Spouse  Advanced Directive information Does patient have an advance directive?: No  Chief Complaint  Patient presents with  . Annual Exam    Wellness exam  . Medical Management of Chronic Issues    blood pressure, spinal stenosis, anxiety, cholesterol, prostate cancer.  Marland Kitchen MMSE    30/30 passed clock drawing    HPI:   Essential hypertension - controlled  Neutropenia, unspecified type ([place]) - follow up lab  Hyperlipidemia, unspecified hyperlipidemia type - follow lab  Coronary artery disease involving coronary bypass graft of native heart with angina pectoris with documented spasm (HCC) - no palpitations or angina  Anorexia - improved. No weight loss.  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 No nausea.  Anxiety state - chronic. Current medications help.  Wheezing. Triggered by exertion and cold. Has been told by pulmonary that he has asthma since his teens. Haas albuterol on hand.    Past Medical History:  Diagnosis Date  . Allergic rhinitis due to pollen   . Anginal pain ([place])    once in a while, none recent  . Anxiety   . Anxiety disorder   . Asthma    exercise induced  . Atrial fibrillation (HCC)    hx of  . Basal cell carcinoma of skin of lip   . Clotting disorder ([place])   . Coronary atherosclerosis of native coronary artery    with CABG 3/12  -MAZE procedure   . Diastasis recti 05/07/2016  . Difficulty urinating   . Diverticulosis of colon (without mention of hemorrhage)    mild    . Dysuria   . Edema   . Essential hypertension, benign   . Heart attack 2012  . Helicobacter pylori (H. pylori)   . 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 History of anal fissures   . History of cholelithiasis   . Hyperlipidemia   . Hypertrophy of prostate with urinary obstruction and other lower urinary tract symptoms (LUTS)   . Impotence of organic origin   . Internal hemorrhoids without mention of complication   . Jaundice age 71  . Long term (current) use of anticoagulants   . Nonspecific elevation of levels of transaminase or lactic acid dehydrogenase (LDH)   . Other voice and resonance disorders    after surgery, took breathing tube out and damaged something  . Pneumonia   . Pulmonary embolism ([place]) 2012   s/p heart surgery  . Pulmonary embolism ([place])    3/12  . Rheumatic fever age 38  . Skin cancer    Hx of squamous cell x2  . Sleep apnea    no CPAP  . Unspecified constipation     Past Surgical History:  Procedure Laterality Date  . CHOLECYSTECTOMY N/A 11/18/2013   Procedure: LAPAROSCOPIC CHOLECYSTECTOMY With IOC;  Surgeon: Odis Hollingshead, MD;  Location: [facility];  Service: General;  Laterality: N/A;  . CORONARY ARTERY BYPASS GRAFT  Febuary 13, 2012   full maze   .  HERNIA REPAIR  2010   bilateral  . HYDROCELE EXCISION     dr [name]   . MOHS SURGERY     squamus x2; BCC fo bridge of nose June 2017.  Marland Kitchen SCROTUM EXPLORATION  1990's   multiple  . SPERMATOCELECTOMY  02/1999   Right, Dr.[name]  . SUPERFICIAL SKIN CYSTECTOMY (l) HIP Right 1997   dr [name]  . VASECTOMY  04/1992    Patient Care Team: Estill Dooms, MD as PCP - General (Internal Medicine) Javier Glazier, MD as Consulting Physician (Pulmonary Disease) Danella Sensing, MD as Consulting Physician (Dermatology) Jessy Oto, MD as Consulting Physician (Orthopedic Surgery) Irine Seal, MD as Attending Physician (Urology) Jackolyn Confer, MD as Consulting Physician (General Surgery) Melrose Nakayama, MD as Consulting Physician (Cardiothoracic Surgery) Rozetta Nunnery, MD as Consulting Physician (Otolaryngology)  Social History   Social History  . Marital status: Married    Spouse name: N/A  . Number of children: 1  . Years of education: N/A   Occupational History  . Car Salesman-retired [employer]   Social History Main Topics  . Smoking status: Former Smoker    Packs/day: 1.00    Years: 22.00    Types: Cigarettes    Start date: 08/22/1956    Quit date: 12/08/1977  . Smokeless tobacco: Never Used     Comment: significant second-hand smoke exposure at work  . Alcohol use 0.0 oz/week     Comment: former heavy alcohol use, rare use  . Drug use: No  . Sexual activity: Yes    Partners: Female   Other Topics Concern  . Not on file   Social History Narrative   Daily caffeine       [name]   [name]   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 No pets currently. No hot tub or bird exposure.    Married wife Cristie Hem    reports that he quit smoking about 38 years ago. His smoking use included Cigarettes. He started smoking about 60 years ago. He has a 22.00 pack-year smoking history. He has never used smokeless tobacco. He reports that he drinks alcohol. He reports that he does not use drugs.  Family History  Problem Relation Age of Onset  . Bone cancer Mother   . Diabetes Mother   . Colon cancer Neg Hx   . Esophageal cancer Neg Hx   . Rectal cancer Neg Hx   . Stomach cancer Neg Hx   . Lung disease Neg Hx    Family Status  Relation Status   . Mother Deceased  . Father Deceased  . Brother Alive  . Son Alive  . Son Deceased at age 67   accident  . Neg Hx     Immunization History  Administered Date(s) Administered  . Influenza Split 10/13/2013  . Influenza,inj,Quad PF,36+ Mos 10/07/2016  . Influenza-Unspecified 10/02/2014, 09/08/2015  . Pneumococcal Conjugate-13 10/16/2010, 10/02/2014  . Td 12/15/2007    Allergies  Allergen Reactions  . Heparin     Blood clots   . Penicillin G Shortness Of Breath  . Levaquin [Levofloxacin In D5w] Other (See Comments)    Patient  felt jittery and nervous with insomnia on the medication.  . Aspirin     Breathing complications, can tolerate baby asa  . Codeine     Some is ok  . Oxycodone     Can not take percocet, palpitations  . Sucralfate     Could not sleep  . Zetia [Ezetimibe]     jittery  . Zocor [Simvastatin]     Liver enzymes increased  . Plavix [Clopidogrel Bisulfate] Palpitations    Medications: Patient's Medications  New Prescriptions   No medications on file  Previous Medications   ALBUTEROL (PROVENTIL HFA;VENTOLIN HFA) 108 (90 BASE) MCG/ACT INHALER    Inhale 2 puffs into the lungs every 6 (six) hours as needed for wheezing.   ALPRAZOLAM (XANAX) 0.5 MG TABLET    TAKE 1 TO 2 TABLETS BY MOUTH DAILY TO HELP ANXIETY   AMITRIPTYLINE (ELAVIL) 25 MG TABLET    Take 1/2 tablet at bedtime for rest and to relax muscles   ASPIRIN 81 MG TABLET    Take 81 mg by mouth 3 (three) times a week. Mondays, wednesdays and fridays   CHOLECALCIFEROL (VITAMIN D) 1000 UNITS TABLET    Take 1,000 Units by mouth daily.   DIAZEPAM (VALIUM) 5 MG TABLET    Take one or two tablets by mouth to help rest   OMEPRAZOLE (PRILOSEC) 20 MG CAPSULE    Take 1 capsule (20 mg total) by mouth daily.   ROSUVASTATIN (CRESTOR) 10 MG TABLET    Take 1 tablet (10 mg total) by mouth daily.  Modified Medications   No medications on file  Discontinued Medications   No medications on file    Review of Systems   Constitutional: Positive for fatigue. Negative for activity change, appetite change, chills, fever and unexpected weight change.  HENT: Positive for hearing loss. Negative for congestion, ear pain, rhinorrhea, sore throat, tinnitus, trouble swallowing and voice change.   Eyes: Negative.        Corrective lenses  Respiratory: Positive for wheezing. Negative for cough, choking, chest tightness and shortness of breath.   Cardiovascular: Negative for chest pain, palpitations and leg swelling.       Hx CAD and PAF  Gastrointestinal: Negative for abdominal distention, abdominal pain, constipation, diarrhea and nausea.       Cholecystectomy in Dec 2014.  Endocrine: Negative.  Negative for cold intolerance, heat intolerance, polydipsia, polyphagia and polyuria.  Genitourinary: Positive for frequency. Negative for dysuria, testicular pain and urgency.       BPH  Musculoskeletal: Positive for back pain (hx lumbar stenosis). Negative for arthralgias, gait problem, myalgias and neck pain.  Skin: Negative for color change, pallor and rash.       Hx of basal and squamous cell cancers removed from face and nose.  Allergic/Immunologic: Negative.   Neurological: Negative.  Negative for dizziness, tremors, syncope, speech difficulty, weakness, numbness and headaches.  Hematological: Negative.  Negative for adenopathy. Does not bruise/bleed easily.  Psychiatric/Behavioral: Positive for dysphoric mood and sleep disturbance. Negative for behavioral problems, confusion, decreased concentration and hallucinations. The patient is nervous/anxious.     Vitals:   10/07/16 1455  BP: 118/66  Pulse: 68  Temp: 98 F (36.7 C)  TempSrc: Oral  SpO2: 97%  Weight: 195 lb (88.5 kg)  Height: _0  (1.778 m)   Body mass index is 27.98 kg/m. Filed Weights   10/07/16 1455  Weight: 195 lb (88.5 kg)     Physical Exam  Constitutional: He appears well-developed and  well-nourished. No distress.  HENT:  Head:  Normocephalic and atraumatic.  Right Ear: External ear normal.  Left Ear: External ear normal.  Nose: Nose normal.  Mouth/Throat: Oropharynx is clear and moist.  Dentures. Missing some teeth.  Eyes: Conjunctivae and EOM are normal. Pupils are equal, round, and reactive to light.  Neck: No JVD present. No tracheal deviation present. No thyromegaly present.  Cardiovascular: Normal rate, regular rhythm, normal heart sounds and intact distal pulses.  Exam reveals no gallop and no friction rub.   No murmur heard. Pulmonary/Chest: No respiratory distress. He has no wheezes. He has no rales. He exhibits no tenderness.  Abdominal: He exhibits no distension and no mass. There is no tenderness. There is no rebound and no guarding. No hernia.  post laparoscopic cholecystectomy scars.  Mild diastasis recti.  Genitourinary:  Genitourinary Comments: Enlarged prostate noted on prior exam.  Musculoskeletal: He exhibits no edema or tenderness (anterior to the right lateral malleolus has resolved).  Neurological: He displays normal reflexes. No cranial nerve deficit. Coordination normal.  Skin: No rash noted. No erythema. No pallor.  Psychiatric: He has a normal mood and affect. His behavior is normal. Judgment and thought content normal.  Nursing note and vitals reviewed.   Labs reviewed: Lab Summary Latest Ref Rng & Units 05/02/2016 03/05/2016 02/29/2016 02/27/2016 02/24/2016 10/30/2015  Hemoglobin 14.1 - 18.1 g/dL (None) 15.9 15.4 15.4 16.5 (None)  Hematocrit 43.5 - 53.7 % (None) 43.9 43.3(A) 43.6 46.3 (None)  White count 4.6 - 10.2 K/uL (None) 6.7 4.1(A) 3.4(A) 6.7 (None)  Platelet count - (None) (None) (None) (None) (None) (None)  Sodium 134 - 144 mmol/L 143 (None) (None) (None) (None) 143  Potassium 3.5 - 5.2 mmol/L 5.2 (None) (None) (None) (None) 4.5  Calcium 8.6 - 10.2 mg/dL 9.5 (None) (None) (None) (None) 9.4  Phosphorus - (None) (None) (None) (None) (None) (None)  Creatinine 0.76 - 1.27 mg/dL  1.13 (None) (None) (None) (None) 1.03  AST 0 - 40 IU/L 23 (None) (None) (None) (None) 31  Alk Phos 39 - 117 IU/L 50 (None) (None) (None) (None) 51  Bilirubin 0.0 - 1.2 mg/dL 0.9 (None) (None) (None) (None) 1.2  Glucose 65 - 99 mg/dL 99 (None) (None) (None) (None) 93  Cholesterol - (None) (None) (None) (None) (None) (None)  HDL cholesterol >39 mg/dL 56 (None) (None) (None) (None) 59  Triglycerides 0 - 149 mg/dL 69 (None) (None) (None) (None) 66  LDL Direct - (None) (None) (None) (None) (None) (None)  LDL Calc 0 - 99 mg/dL 98 (None) (None) (None) (None) 99  Total protein - (None) (None) (None) (None) (None) (None)  Albumin 3.5 - 4.8 g/dL 4.1 (None) (None) (None) (None) 3.9  Some recent data might be hidden   Lab Results  Component Value Date   BUN 13 05/02/2016   Lab Results  Component Value Date   HGBA1C (H) 01/17/2011    6.0 (NOTE)                                                                       According to the ADA Clinical Practice Recommendations for 2011, when HbA1c is used as a screening test:   >=6.5%   Diagnostic of Diabetes Mellitus           (  if abnormal result  is confirmed)  5.7-6.4%   Increased risk of developing Diabetes Mellitus  References:Diagnosis and Classification of Diabetes Mellitus,Diabetes UVJD,0518,33(POIPP 1):S62-S69 and Standards of Medical Care in         Diabetes - 2011,Diabetes GFQM,2103,12  (Suppl 1):S11-S61.   Lab Results  Component Value Date   TSH 3.340 10/17/2014     Assessment/Plan 1. Neutropenia, unspecified type ([place]) - CBC with Differential/Platelet  2. Essential hypertension Controlled -cmp  3. Hyperlipidemia, unspecified hyperlipidemia type -lipid panel  4. Coronary artery disease involving coronary bypass graft of native heart with angina pectoris with documented spasm (HCC) stable  5. Anorexia improved  6. Diastasis recti unchanged  7. Sprain of right lower leg, subsequent encounter resolved  8. Basal cell carcinoma  of skin of nose Fully removed  9. Pancreas cyst stable  10. Anxiety state stable  11. Hyperglycemia - Hemoglobin A1c  12. Wheeze Use albuterol as needed up to 4 times daily

## 2016-10-07 NOTE — Patient Instructions (Signed)
Use albuterol up to 4 times daily to help wheezing

## 2016-10-07 NOTE — Patient Instructions (Signed)
Alexander [name] , Thank you for taking time to come for your Medicare Wellness Visit. I appreciate your ongoing commitment to your health goals. Please review the following plan we discussed and let me know if I can assist you in the future.   These are the goals we discussed: Goals    None      This is a list of the screening recommended for you and due dates:  Health Maintenance  Topic Date Due  . Pneumonia vaccines (2 of 2 - PPSV23) 10/03/2015  . Flu Shot  07/08/2016  . Shingles Vaccine  12/08/2016*  . Tetanus Vaccine  12/14/2017  *Topic was postponed. The date shown is not the original due date.  Preventive Care for Adults  A healthy lifestyle and preventive care can promote health and wellness. Preventive health guidelines for adults include the following key practices.  . A routine yearly physical is a good way to check with your health care provider about your health and preventive screening. It is a chance to share any concerns and updates on your health and to receive a thorough exam.  . Visit your dentist for a routine exam and preventive care every 6 months. Brush your teeth twice a day and floss once a day. Good oral hygiene prevents tooth decay and gum disease.  . The frequency of eye exams is based on your age, health, family medical history, use  of contact lenses, and other factors. Follow your health care provider's ecommendations for frequency of eye exams.  . Eat a healthy diet. Foods like vegetables, fruits, whole grains, low-fat dairy products, and lean protein foods contain the nutrients you need without too many calories. Decrease your intake of foods high in solid fats, added sugars, and salt. Eat the right amount of calories for you. Get information about a proper diet from your health care provider, if necessary.  . Regular physical exercise is one of the most important things you can do for your health. Most adults should get at least 150 minutes of  moderate-intensity exercise (any activity that increases your heart rate and causes you to sweat) each week. In addition, most adults need muscle-strengthening exercises on 2 or more days a week.  Silver Sneakers may be a benefit available to you. To determine eligibility, you may visit the website: www.silversneakers.com or contact program at (972)608-2042 Mon-Fri between 8AM-8PM.   . Maintain a healthy weight. The body mass index (BMI) is a screening tool to identify possible weight problems. It provides an estimate of body fat based on height and weight. Your health care provider can find your BMI and can help you achieve or maintain a healthy weight.   For adults 20 years and older: ? A BMI below 18.5 is considered underweight. ? A BMI of 18.5 to 24.9 is normal. ? A BMI of 25 to 29.9 is considered overweight. ? A BMI of 30 and above is considered obese.   . Maintain normal blood lipids and cholesterol levels by exercising and minimizing your intake of saturated fat. Eat a balanced diet with plenty of fruit and vegetables. Blood tests for lipids and cholesterol should begin at age 54 and be repeated every 5 years. If your lipid or cholesterol levels are high, you are over 50, or you are at high risk for heart disease, you may need your cholesterol levels checked more frequently. Ongoing high lipid and cholesterol levels should be treated with medicines if diet and exercise are not working.  Marland Kitchen  If you smoke, find out from your health care provider how to quit. If you do not use tobacco, please do not start.  . If you choose to drink alcohol, please do not consume more than 2 drinks per day. One drink is considered to be 12 ounces (355 mL) of beer, 5 ounces (148 mL) of wine, or 1.5 ounces (44 mL) of liquor.  . If you are 66-15 years old, ask your health care provider if you should take aspirin to prevent strokes.  . Use sunscreen. Apply sunscreen liberally and repeatedly throughout the day. You  should seek shade when your shadow is shorter than you. Protect yourself by wearing long sleeves, pants, a wide-brimmed hat, and sunglasses year round, whenever you are outdoors.  . Once a month, do a whole body skin exam, using a mirror to look at the skin on your back. Tell your health care provider of new moles, moles that have irregular borders, moles that are larger than a pencil eraser, or moles that have changed in shape or color.

## 2016-10-07 NOTE — Progress Notes (Signed)
Subjective:   Alexander [name] is a 80 y.o. male who presents for an Initial Medicare Annual Wellness Visit.  Review of Systems   Cardiac Risk Factors include: advanced age (>4men, >3 women);male gender;smoking/ tobacco exposure    Objective:    Today's Vitals   10/07/16 1357  BP: 118/66  Pulse: 68  Temp: 98 F (36.7 C)  TempSrc: Oral  SpO2: 97%  Weight: 195 lb 6.4 oz (88.6 kg)  PainSc: 0-No pain   Body mass index is 28.04 kg/m.  Current Medications (verified) Outpatient Encounter Prescriptions as of 10/07/2016  Medication Sig  . albuterol (PROVENTIL HFA;VENTOLIN HFA) 108 (90 BASE) MCG/ACT inhaler Inhale 2 puffs into the lungs every 6 (six) hours as needed for wheezing.  Marland Kitchen ALPRAZolam (XANAX) 0.5 MG tablet TAKE 1 TO 2 TABLETS BY MOUTH DAILY TO HELP ANXIETY  . amitriptyline (ELAVIL) 25 MG tablet Take 1/2 tablet at bedtime for rest and to relax muscles  . aspirin 81 MG tablet Take 81 mg by mouth 3 (three) times a week. Mondays, wednesdays and fridays  . cholecalciferol (VITAMIN D) 1000 UNITS tablet Take 1,000 Units by mouth daily.  . diazepam (VALIUM) 5 MG tablet Take one or two tablets by mouth to help rest  . omeprazole (PRILOSEC) 20 MG capsule Take 1 capsule (20 mg total) by mouth daily. (Patient taking differently: Take 20 mg by mouth daily as needed. )  . rosuvastatin (CRESTOR) 10 MG tablet Take 1 tablet (10 mg total) by mouth daily.   No facility-administered encounter medications on file as of 10/07/2016.     Allergies (verified) Heparin; Penicillin g; Levaquin [levofloxacin in d5w]; Aspirin; Codeine; Oxycodone; Sucralfate; Zetia [ezetimibe]; Zocor [simvastatin]; and Plavix [clopidogrel bisulfate]   History: Past Medical History:  Diagnosis Date  . Allergic rhinitis due to pollen   . Anginal pain ([place])    once in a while, none recent  . Anxiety   . Anxiety disorder   . Asthma    exercise induced  . Atrial fibrillation (HCC)    hx of  . Basal cell  carcinoma of skin of lip   . Clotting disorder ([place])   . Coronary atherosclerosis of native coronary artery    with CABG 3/12  -MAZE procedure   . Diastasis recti 05/07/2016  . Difficulty urinating   . Diverticulosis of colon (without mention of hemorrhage)    mild  . Dysuria   . Edema   . Essential hypertension, benign   . Heart attack 2012  . Helicobacter pylori (H. pylori)   . 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 History of anal fissures   . History of cholelithiasis   . Hyperlipidemia   . Hypertrophy of prostate with urinary obstruction and other lower urinary tract symptoms (LUTS)   . Impotence of organic origin   . Internal hemorrhoids without mention of complication   . Jaundice age 22  . Long term (current) use of anticoagulants   . Nonspecific elevation of levels of transaminase or lactic acid dehydrogenase (LDH)   . Other voice and resonance disorders    after surgery, took breathing tube out and damaged something  . Pneumonia   . Pulmonary embolism ([place]) 2012   s/p heart surgery  . Pulmonary embolism ([place])    3/12  . Rheumatic fever age 69  . Skin cancer    Hx of squamous cell x2  . Sleep apnea    no CPAP  . Unspecified constipation  Past Surgical History:  Procedure Laterality Date  . CHOLECYSTECTOMY N/A 11/18/2013   Procedure: LAPAROSCOPIC CHOLECYSTECTOMY With IOC;  Surgeon: Odis Hollingshead, MD;  Location: [facility];  Service: General;  Laterality: N/A;  . CORONARY ARTERY BYPASS GRAFT  Febuary 13, 2012   full maze   . HERNIA REPAIR  2010   bilateral  . HYDROCELE EXCISION     dr [name]   . MOHS SURGERY     squamus x2  . SCROTUM EXPLORATION  1990's   multiple  . SPERMATOCELECTOMY  02/1999   Right, Dr.[name]  . SUPERFICIAL SKIN CYSTECTOMY (l) HIP Right 1997   dr [name]  . VASECTOMY  04/1992   Family History  Problem Relation Age of Onset  . Bone cancer Mother   . Diabetes Mother   . Colon cancer Neg Hx   . Esophageal cancer Neg Hx     . Rectal cancer Neg Hx   . Stomach cancer Neg Hx   . Lung disease Neg Hx    Social History   Occupational History  . Car Salesman-retired [employer]   Social History Main Topics  . Smoking status: Former Smoker    Packs/day: 1.00    Years: 22.00    Types: Cigarettes    Start date: 08/22/1956    Quit date: 12/08/1977  . Smokeless tobacco: Never Used     Comment: significant second-hand smoke exposure at work  . Alcohol use 0.0 oz/week     Comment: former heavy alcohol use, rare use  . Drug use: No  . Sexual activity: Yes    Partners: Female   Tobacco Counseling Counseling given: No   Activities of Daily Living In your present state of health, do you have any difficulty performing the following activities: 10/07/2016  Hearing? Y  Vision? Y  Difficulty concentrating or making decisions? N  Walking or climbing stairs? N  Dressing or bathing? N  Doing errands, shopping? N  Preparing Food and eating ? N  Using the Toilet? N  In the past six months, have you accidently leaked urine? N  Do you have problems with loss of bowel control? N  Managing your Medications? N  Managing your Finances? N  Housekeeping or managing your Housekeeping? N  Some recent data might be hidden    Immunizations and Health Maintenance Immunization History  Administered Date(s) Administered  . Influenza Split 10/13/2013  . Influenza,inj,Quad PF,36+ Mos 10/07/2016  . Influenza-Unspecified 10/02/2014, 09/08/2015  . Pneumococcal Conjugate-13 10/16/2010, 10/02/2014  . Td 12/15/2007   Health Maintenance Due  Topic Date Due  . PNA vac Low Risk Adult (2 of 2 - PPSV23) 10/03/2015  . INFLUENZA VACCINE  07/08/2016    Patient Care Team: Estill Dooms, MD as PCP - General (Internal Medicine)  Indicate any recent Medical Services you may have received from other than [facility] providers in the past year (date may be approximate).    Assessment:   This is a routine wellness examination for [place].    Hearing/Vision screen Hearing Screening Comments: Last hearing exam done 2014. Does not use his bilateral hearing aids. States he has a problem with high frequency.  Vision Screening Comments: Pt is scheduling an eye exam by the end of the year.   Dietary issues and exercise activities discussed: Current Exercise Habits: Structured exercise class, Type of exercise: strength training/weights;stretching;walking, Time (Minutes): 60, Frequency (Times/Week): 4, Weekly Exercise (Minutes/Week): 240, Intensity: Moderate, Exercise limited by: None identified  Goals    . Maintaining  physical activity          Starting 10/07/16, I will maintain my normal exercise routine daily/weekly.       Depression Screen PHQ 2/9 Scores 10/07/2016 05/07/2016 03/05/2016 03/05/2016  PHQ - 2 Score 0 0 0 0    Fall Risk Fall Risk  10/07/2016 06/04/2016 05/07/2016 05/07/2016 11/06/2015  Falls in the past year? Yes No No No No  Number falls in past yr: 1 - - - -  Injury with Fall? No - - - -  Follow up Falls prevention discussed - - - -    Cognitive Function: MMSE - Mini Mental State Exam 10/07/2016 11/06/2015 10/18/2014  Not completed: - (No Data) -  Orientation to time 5 5 5   Orientation to Place 5 5 5   Registration 3 3 3   Attention/ Calculation 5 5 5   Recall 3 3 2   Language- name 2 objects 2 2 2   Language- repeat 1 1 1   Language- follow 3 step command 3 2 3   Language- read & follow direction 1 1 1   Write a sentence 1 1 1   Copy design 1 1 1   Total score 30 29 29         Screening Tests Health Maintenance  Topic Date Due  . PNA vac Low Risk Adult (2 of 2 - PPSV23) 10/03/2015  . INFLUENZA VACCINE  07/08/2016  . ZOSTAVAX  12/08/2016 (Originally 08/22/1998)  . TETANUS/TDAP  12/14/2017        Plan:    I have personally reviewed and addressed the Medicare Annual Wellness questionnaire and have noted the following in the patient's chart:  A. Medical and social history B. Use of alcohol, tobacco  or illicit drugs  C. Current medications and supplements D. Functional ability and status E.  Nutritional status F.  Physical activity G. Advance directives H. List of other physicians I.  Hospitalizations, surgeries, and ER visits in previous 12 months J.  [place] to include hearing, vision, cognitive, depression L. Referrals and appointments - none  In addition, I have reviewed and discussed with patient certain preventive protocols, quality metrics, and best practice recommendations. A written personalized care plan for preventive services as well as general preventive health recommendations were provided to patient.  See attached scanned questionnaire for additional information.   Signed,   Allyn Kenner, LPN Health Advisor  I have reviewed the information entered by the Health Advisor. I was present in the office during the time of patient interaction and was available for consultation. I agree with the documentation and advice.  Viviann Spare Nyoka Cowden, MD

## 2016-10-08 LAB — COMPREHENSIVE METABOLIC PANEL
ALBUMIN: 3.9 g/dL (ref 3.6–5.1)
ALT: 17 U/L (ref 9–46)
AST: 24 U/L (ref 10–35)
Alkaline Phosphatase: 43 U/L (ref 40–115)
BILIRUBIN TOTAL: 1.3 mg/dL — AB (ref 0.2–1.2)
BUN: 17 mg/dL (ref 7–25)
CO2: 26 mmol/L (ref 20–31)
CREATININE: 1.13 mg/dL (ref 0.70–1.18)
Calcium: 9 mg/dL (ref 8.6–10.3)
Chloride: 103 mmol/L (ref 98–110)
Glucose, Bld: 96 mg/dL (ref 65–99)
Potassium: 4.2 mmol/L (ref 3.5–5.3)
SODIUM: 140 mmol/L (ref 135–146)
TOTAL PROTEIN: 6.7 g/dL (ref 6.1–8.1)

## 2016-10-08 LAB — HEMOGLOBIN A1C
HEMOGLOBIN A1C: 5.8 % — AB (ref <5.7)
MEAN PLASMA GLUCOSE: 120 mg/dL

## 2016-10-08 LAB — LIPID PANEL
Cholesterol: 165 mg/dL (ref 125–200)
HDL: 55 mg/dL (ref >=40)
LDL Cholesterol: 93 mg/dL (ref <130)
Total CHOL/HDL Ratio: 3 Ratio (ref <=5.0)
Triglycerides: 85 mg/dL (ref <150)
VLDL: 17 mg/dL (ref <30)

## 2016-10-15 ENCOUNTER — Other Ambulatory Visit: Payer: Self-pay | Admitting: Internal Medicine

## 2016-10-15 DIAGNOSIS — Z125 Encounter for screening for malignant neoplasm of prostate: Secondary | ICD-10-CM

## 2016-10-15 DIAGNOSIS — N400 Enlarged prostate without lower urinary tract symptoms: Secondary | ICD-10-CM

## 2016-10-16 ENCOUNTER — Other Ambulatory Visit: Payer: Self-pay | Admitting: Internal Medicine

## 2016-10-16 DIAGNOSIS — F411 Generalized anxiety disorder: Secondary | ICD-10-CM

## 2016-10-28 DIAGNOSIS — L738 Other specified follicular disorders: Secondary | ICD-10-CM | POA: Diagnosis not present

## 2016-11-04 ENCOUNTER — Encounter: Payer: Self-pay | Admitting: Internal Medicine

## 2016-11-04 DIAGNOSIS — L720 Epidermal cyst: Secondary | ICD-10-CM | POA: Diagnosis not present

## 2016-11-04 DIAGNOSIS — D2362 Other benign neoplasm of skin of left upper limb, including shoulder: Secondary | ICD-10-CM | POA: Diagnosis not present

## 2016-12-05 DIAGNOSIS — L821 Other seborrheic keratosis: Secondary | ICD-10-CM | POA: Diagnosis not present

## 2016-12-05 DIAGNOSIS — D225 Melanocytic nevi of trunk: Secondary | ICD-10-CM | POA: Diagnosis not present

## 2016-12-05 DIAGNOSIS — L438 Other lichen planus: Secondary | ICD-10-CM | POA: Diagnosis not present

## 2016-12-05 DIAGNOSIS — Z85828 Personal history of other malignant neoplasm of skin: Secondary | ICD-10-CM | POA: Diagnosis not present

## 2016-12-05 DIAGNOSIS — D1801 Hemangioma of skin and subcutaneous tissue: Secondary | ICD-10-CM | POA: Diagnosis not present

## 2016-12-05 DIAGNOSIS — L570 Actinic keratosis: Secondary | ICD-10-CM | POA: Diagnosis not present

## 2016-12-29 ENCOUNTER — Encounter: Payer: Self-pay | Admitting: Interventional Cardiology

## 2016-12-29 ENCOUNTER — Ambulatory Visit (INDEPENDENT_AMBULATORY_CARE_PROVIDER_SITE_OTHER): Payer: Medicare Other | Admitting: Interventional Cardiology

## 2016-12-29 VITALS — BP 112/60 | HR 68 | Ht 70.0 in | Wt 197.8 lb

## 2016-12-29 DIAGNOSIS — I209 Angina pectoris, unspecified: Secondary | ICD-10-CM | POA: Diagnosis not present

## 2016-12-29 DIAGNOSIS — I25810 Atherosclerosis of coronary artery bypass graft(s) without angina pectoris: Secondary | ICD-10-CM

## 2016-12-29 DIAGNOSIS — I10 Essential (primary) hypertension: Secondary | ICD-10-CM

## 2016-12-29 DIAGNOSIS — E782 Mixed hyperlipidemia: Secondary | ICD-10-CM | POA: Diagnosis not present

## 2016-12-29 NOTE — Progress Notes (Signed)
Cardiology Office Note    Date:  12/29/2016   ID:  Jaen, Schildt [birth year], MRN [id number]  PCP:  Jeanmarie Hubert, MD  Cardiologist: Sinclair Grooms, MD   Chief Complaint  Patient presents with  . Coronary Artery Disease    History of Present Illness:  Alexander [name] is a 81 y.o. male who presents for follow-up of CAD/CABG and cardiovascular risk factors. He denies angina. He has had prior coronary bypass graftingIn 2012 with LIMA to LAD, SVG to distal right, and SVG to the first diagonal. He has relative statin intolerance although he is able to take very low dose Crestor. Other medical problems include hypertension and asthma.  He is doing relatively well now. Over the past year he has had at least 2 significant upper respiratory infections of required hospitalization. Because of this he has stopped exercising. He used to do a lot of exercise in facilities but has stayed away from joint use facility exercise because of exposure to viruses and the flu. Because of this he has gained some weight. He feels his endurance is not as good. He has not had angina or palpitations. Overall he feels well.   Coronary bypass surgery 2012 [facility]); Median sternotomy, extracorporeal circulation, coronary artery bypass grafting x3 (left internal mammary artery to left anterior descending, saphenous vein graft to first diagonal, saphenous vein graft to distal right coronary), with ligation of the left atrial appendage.  Past Medical History:  Diagnosis Date  . Allergic rhinitis due to pollen   . Anginal pain ([place])    once in a while, none recent  . Anxiety   . Anxiety disorder   . Asthma    exercise induced  . Atrial fibrillation (HCC)    hx of  . Basal cell carcinoma of skin of lip   . Clotting disorder ([place])   . Coronary atherosclerosis of native coronary artery    with CABG 3/12  -MAZE procedure   . Diastasis recti 05/07/2016  . Difficulty urinating   . Diverticulosis of  colon (without mention of hemorrhage)    mild  . Dysuria   . Edema   . Essential hypertension, benign   . Heart attack 2012  . Helicobacter pylori (H. pylori)   . 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 History of anal fissures   . History of cholelithiasis   . Hyperlipidemia   . Hypertrophy of prostate with urinary obstruction and other lower urinary tract symptoms (LUTS)   . Impotence of organic origin   . Internal hemorrhoids without mention of complication   . Jaundice age 89  . Long term (current) use of anticoagulants   . Neutropenia ([place]) 10/07/2016  . Nonspecific elevation of levels of transaminase or lactic acid dehydrogenase (LDH)   . Other voice and resonance disorders    after surgery, took breathing tube out and damaged something  . Pneumonia   . Pulmonary embolism ([place]) 2012   s/p heart surgery  . Pulmonary embolism ([place])    3/12  . Rheumatic fever age 40  . Skin cancer    Hx of squamous cell x2  . Sleep apnea    no CPAP  . Unspecified constipation     Past Surgical History:  Procedure Laterality Date  . CARDIAC CATHETERIZATION    . CHOLECYSTECTOMY N/A 11/18/2013   Procedure: LAPAROSCOPIC CHOLECYSTECTOMY With IOC;  Surgeon: Odis Hollingshead, MD;  Location: [facility];  Service: General;  Laterality: N/A;  .  CORONARY ARTERY BYPASS GRAFT  2012   Median sternotomy, extracorporeal circulation, coronary  . HERNIA REPAIR  2010   bilateral  . HYDROCELE EXCISION     dr [name]   . MOHS SURGERY     squamus x2; BCC fo bridge of nose June 2017.  Marland Kitchen SCROTUM EXPLORATION  1990's   multiple  . SPERMATOCELECTOMY  02/1999   Right, Dr.[name]  . SUPERFICIAL SKIN CYSTECTOMY (l) HIP Right 1997   dr [name]  . VASECTOMY  04/1992    Current Medications: Outpatient Medications Prior to Visit  Medication Sig Dispense Refill  . albuterol (PROVENTIL HFA;VENTOLIN HFA) 108 (90 BASE) MCG/ACT inhaler Inhale 2 puffs into the lungs every 6 (six) hours as needed for wheezing.  1 Inhaler 0  . ALPRAZolam (XANAX) 0.5 MG tablet TAKE 1-2 TABLETS BY MOUTH EVERY DAY TO HELP WITH ANXIETY 60 tablet 0  . amitriptyline (ELAVIL) 25 MG tablet Take 1/2 tablet at bedtime for rest and to relax muscles 90 tablet 1  . aspirin 81 MG tablet Take 81 mg by mouth 3 (three) times a week. Mondays, wednesdays and fridays    . cholecalciferol (VITAMIN D) 1000 UNITS tablet Take 1,000 Units by mouth daily.    . diazepam (VALIUM) 5 MG tablet Take one or two tablets by mouth to help rest 60 tablet 5  . rosuvastatin (CRESTOR) 10 MG tablet Take 1 tablet (10 mg total) by mouth daily. 30 tablet 11  . omeprazole (PRILOSEC) 20 MG capsule Take 1 capsule (20 mg total) by mouth daily. (Patient not taking: Reported on 12/29/2016) 90 capsule 3   No facility-administered medications prior to visit.      Allergies:   Heparin; Penicillin g; Levaquin [levofloxacin in d5w]; Aspirin; Codeine; Oxycodone; Sucralfate; Zetia [ezetimibe]; Zocor [simvastatin]; and Plavix [clopidogrel bisulfate]   Social History   Social History  . Marital status: Married    Spouse name: N/A  . Number of children: 1  . Years of education: N/A   Occupational History  . Car Salesman-retired [employer]   Social History Main Topics  . Smoking status: Former Smoker    Packs/day: 1.00    Years: 22.00    Types: Cigarettes    Start date: 08/22/1956    Quit date: 12/08/1977  . Smokeless tobacco: Never Used     Comment: significant second-hand smoke exposure at work  . Alcohol use 0.0 oz/week     Comment: former heavy alcohol use, rare use  . Drug use: No  . Sexual activity: Yes    Partners: Female   Other Topics Concern  . None   Social History Narrative   Daily caffeine       [name]   [name]   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 No pets currently. No hot tub or bird exposure.    Married wife Cristie Hem     Family History:  The patient's family history includes Bone cancer in his mother; Diabetes in his mother.   ROS:   Please see the history of present illness.    Muscle soreness and stiffness if his statin dose is too high. Other complaints include recurring cough, decreased  hearing, fatigue, anxiety, some difficulty with urination, back discomfort, easy bruising, and occasional insomnia.  All other systems reviewed and are negative.   PHYSICAL EXAM:   VS:  BP 112/60 (BP Location: Right Arm)   Pulse 68   Ht 5\' 10"  (1.778 m)   Wt 197 lb 12.8 oz (89.7 kg)   BMI 28.38 kg/m    GEN: Well nourished, well developed, in no acute distress  HEENT: normal  Neck: no JVD, carotid bruits, or masses Cardiac: RRR; no murmurs, rubs, or gallops,no edema  Respiratory:  clear to auscultation bilaterally, normal work of breathing GI: soft, nontender, nondistended, + BS MS: no deformity or atrophy  Skin: warm and dry, no rash Neuro:  Alert and Oriented x 3, Strength and sensation are intact Psych: euthymic mood, full affect  Wt Readings from Last 3 Encounters:  12/29/16 197 lb 12.8 oz (89.7 kg)  10/07/16 195 lb (88.5 kg)  10/07/16 195 lb 6.4 oz (88.6 kg)      Studies/Labs Reviewed:   EKG:  EKG  Normal sinus rhythm with first AV block, 210 ms.  Recent Labs: 10/07/2016: ALT 17; BUN 17; Creat 1.13; Hemoglobin 16.4; Platelets 251; Potassium 4.2; Sodium 140   Lipid Panel    Component Value Date/Time   CHOL 165 10/07/2016 0950   CHOL 168 05/02/2016 0826   TRIG 85 10/07/2016 0950   HDL 55 10/07/2016 0950   HDL 56 05/02/2016 0826   CHOLHDL 3.0 10/07/2016 0950   VLDL 17 10/07/2016  0950   LDLCALC 93 10/07/2016 0950   LDLCALC 98 05/02/2016 0826    Additional studies/ records that were reviewed today include:  No new data    ASSESSMENT:    1. Coronary artery disease involving coronary bypass graft without angina pectoris, unspecified whether native or transplanted heart   2. Essential hypertension   3. Angina pectoris ([place])   4. Mixed hyperlipidemia      PLAN:  In order of problems listed above:  1. The patient is 6 years post coronary bypass grafting. He has an active lifestyle. He has no angina. No recent functional testing has been performed. There is no clear indication at this point. We will continue aggressive risk factor modification of blood pressure and lipids. I have encouraged aerobic activity. 2. Blood pressure target 140/90 mmHg or less. Low salt diet as advocated. 3. He has had no recurrence of angina. Even after bypass surgery he experienced some recurring spells of what he thought was angina. Over the past year there has been no nitroglycerin use. 4. LDL cholesterol was 99 when last evaluated. This level is without aggressive diet or exercise. We discussed resuming a low fat diet and getting back in aerobic exercise when possible.  The current plan is for the patient to follow-up in one year. He should exercise more and even less fat over the next 12 months. He should call if any chest discomfort.    Medication Adjustments/Labs and Tests Ordered: Current medicines are reviewed at length with the patient today.  Concerns regarding medicines are outlined above.  Medication changes, Labs and Tests ordered today are listed in the Patient Instructions below. Patient Instructions  Medication Instructions:  None  Labwork: None  Testing/Procedures: None  Follow-Up: Your physician wants you to follow-up in: 1 year with Dr. Tamala Julian. You will receive a reminder letter in the mail two months in advance. If you don't receive a letter, please call our  office to schedule the follow-up appointment.  Any Other Special Instructions Will Be Listed Below (If Applicable).  Your physician discussed the importance of regular exercise and recommended that you start or continue a regular exercise program for good health.    If you need a refill on your cardiac medications before your next appointment, please call your pharmacy.      Signed, Sinclair Grooms, MD  12/29/2016 1:08 PM    [facility] [address], [place], [place]  [address] Phone: [phone]; Fax: [phone]

## 2016-12-29 NOTE — Patient Instructions (Signed)
Medication Instructions:  None  Labwork: None  Testing/Procedures: None  Follow-Up: Your physician wants you to follow-up in: 1 year with Dr. Smith. You will receive a reminder letter in the mail two months in advance. If you don't receive a letter, please call our office to schedule the follow-up appointment.   Any Other Special Instructions Will Be Listed Below (If Applicable).  Your physician discussed the importance of regular exercise and recommended that you start or continue a regular exercise program for good health.    If you need a refill on your cardiac medications before your next appointment, please call your pharmacy.   

## 2016-12-31 ENCOUNTER — Telehealth: Payer: Self-pay | Admitting: Gastroenterology

## 2016-12-31 NOTE — Telephone Encounter (Signed)
Yes that's okay with me if okay with Dr. Silverio Decamp.

## 2017-01-01 NOTE — Telephone Encounter (Signed)
Dr. Nandigam will you accept this patient? °

## 2017-01-02 NOTE — Telephone Encounter (Signed)
ok 

## 2017-01-05 DIAGNOSIS — M79645 Pain in left finger(s): Secondary | ICD-10-CM | POA: Diagnosis not present

## 2017-01-05 DIAGNOSIS — R229 Localized swelling, mass and lump, unspecified: Secondary | ICD-10-CM | POA: Diagnosis not present

## 2017-01-05 NOTE — Telephone Encounter (Signed)
Spoke w/patient.  He will call back to schedule an appt with Dr. Silverio Decamp

## 2017-01-07 DIAGNOSIS — L570 Actinic keratosis: Secondary | ICD-10-CM | POA: Diagnosis not present

## 2017-01-07 DIAGNOSIS — M67442 Ganglion, left hand: Secondary | ICD-10-CM | POA: Diagnosis not present

## 2017-01-07 DIAGNOSIS — L821 Other seborrheic keratosis: Secondary | ICD-10-CM | POA: Diagnosis not present

## 2017-01-11 ENCOUNTER — Other Ambulatory Visit: Payer: Self-pay | Admitting: Nurse Practitioner

## 2017-01-11 DIAGNOSIS — F411 Generalized anxiety disorder: Secondary | ICD-10-CM

## 2017-03-05 DIAGNOSIS — R3121 Asymptomatic microscopic hematuria: Secondary | ICD-10-CM | POA: Diagnosis not present

## 2017-03-05 DIAGNOSIS — L723 Sebaceous cyst: Secondary | ICD-10-CM | POA: Diagnosis not present

## 2017-03-11 ENCOUNTER — Encounter: Payer: Self-pay | Admitting: Internal Medicine

## 2017-03-11 ENCOUNTER — Ambulatory Visit (INDEPENDENT_AMBULATORY_CARE_PROVIDER_SITE_OTHER): Payer: Medicare Other | Admitting: Internal Medicine

## 2017-03-11 VITALS — BP 116/64 | HR 66 | Temp 97.6°F | Ht 70.0 in | Wt 199.0 lb

## 2017-03-11 DIAGNOSIS — F422 Mixed obsessional thoughts and acts: Secondary | ICD-10-CM

## 2017-03-11 DIAGNOSIS — F411 Generalized anxiety disorder: Secondary | ICD-10-CM

## 2017-03-11 DIAGNOSIS — I10 Essential (primary) hypertension: Secondary | ICD-10-CM | POA: Diagnosis not present

## 2017-03-11 MED ORDER — DIAZEPAM 5 MG PO TABS: ORAL_TABLET | ORAL | 5 refills | Status: DC

## 2017-03-11 MED ORDER — ALPRAZOLAM 0.5 MG PO TABS: ORAL_TABLET | ORAL | 5 refills | Status: DC

## 2017-03-11 MED ORDER — ALPRAZOLAM 0.5 MG PO TABS: ORAL_TABLET | ORAL | 0 refills | Status: DC

## 2017-03-11 NOTE — Progress Notes (Signed)
Facility  [place]    Place of Service:   OFFICE    Allergies  Allergen Reactions  . Heparin     Blood clots   . Penicillin G Shortness Of Breath  . Levaquin [Levofloxacin In D5w] Other (See Comments)    Patient felt jittery and nervous with insomnia on the medication.  . Aspirin     Breathing complications, can tolerate baby asa  . Codeine     Some is ok  . Oxycodone     Can not take percocet, palpitations  . Sucralfate     Could not sleep  . Zetia [Ezetimibe]     jittery  . Zocor [Simvastatin]     Liver enzymes increased  . Plavix [Clopidogrel Bisulfate] Palpitations    Chief Complaint  Patient presents with  . Medical Management of Chronic Issues    Medical Management of Blood Pressure    HPI:  Last seen 6 months ago. BP controlled at that time. Has gained 4# since then.  He is applying for disability with the [employer]. He believes he has had emotional difficulties that are service related. He mentions Obsessive Compulsive disorder, panic attacks, and persistent anxiety. He also thinks his tinnitus and hearing loss are service connected. He was hospitalized at [facility] for a year in about 37. He had suicidal thoughts at that time.   Medications: Patient's Medications  New Prescriptions   No medications on file  Previous Medications   ALBUTEROL (PROVENTIL HFA;VENTOLIN HFA) 108 (90 BASE) MCG/ACT INHALER    Inhale 2 puffs into the lungs every 6 (six) hours as needed for wheezing.   ALPRAZOLAM (XANAX) 0.5 MG TABLET    TAKE 1-2 TABLET BY MOUTH EVERY DAILY TO HELP WITH ANXIETY   AMITRIPTYLINE (ELAVIL) 25 MG TABLET    Take 1/2 tablet at bedtime for rest and to relax muscles   ASPIRIN 81 MG TABLET    Take 81 mg by mouth 3 (three) times a week. Mondays, wednesdays and fridays   CHOLECALCIFEROL (VITAMIN D) 1000 UNITS TABLET    Take 1,000 Units by mouth daily.   DIAZEPAM (VALIUM) 5 MG TABLET    Take one or two tablets by mouth to help rest   OMEPRAZOLE (PRILOSEC) 20 MG CAPSULE     Take 20 mg by mouth daily as needed (heartburn).   ROSUVASTATIN (CRESTOR) 10 MG TABLET    Take 1 tablet (10 mg total) by mouth daily.  Modified Medications   No medications on file  Discontinued Medications   No medications on file    Review of Systems  Constitutional: Positive for fatigue. Negative for activity change, appetite change, chills, fever and unexpected weight change.  HENT: Positive for hearing loss. Negative for congestion, ear pain, rhinorrhea, sore throat, tinnitus, trouble swallowing and voice change.   Eyes: Negative.        Corrective lenses  Respiratory: Positive for wheezing. Negative for cough, choking, chest tightness and shortness of breath.   Cardiovascular: Negative for chest pain, palpitations and leg swelling.       Hx CAD and PAF  Gastrointestinal: Negative for abdominal distention, abdominal pain, constipation, diarrhea and nausea.       Cholecystectomy in Dec 2014.  Endocrine: Negative.  Negative for cold intolerance, heat intolerance, polydipsia, polyphagia and polyuria.  Genitourinary: Positive for frequency. Negative for dysuria, testicular pain and urgency.       BPH  Musculoskeletal: Positive for back pain (hx lumbar stenosis). Negative for arthralgias, gait problem, myalgias  and neck pain.  Skin: Negative for color change, pallor and rash.       Hx of basal and squamous cell cancers removed from face and nose.  Allergic/Immunologic: Negative.   Neurological: Negative.  Negative for dizziness, tremors, syncope, speech difficulty, weakness, numbness and headaches.  Hematological: Negative.  Negative for adenopathy. Does not bruise/bleed easily.  Psychiatric/Behavioral: Positive for dysphoric mood and sleep disturbance. Negative for behavioral problems, confusion, decreased concentration and hallucinations. The patient is nervous/anxious.        Patient relates previous thoughts of self destruction and other obsessional intrusive thoughts. He continues  to use both Valium for rest and Xanax for daytime anxiety. These drugs are helpful with controlling obsessive thoughts and impulses according to the patient.    Vitals:   03/11/17 1305  BP: 140/72  Pulse: 68  Temp: 97.6 F (36.4 C)  TempSrc: Oral  SpO2: 97%  Weight: 199 lb (90.3 kg)  Height: '5\' 10"'  (1.778 m)   Body mass index is 28.55 kg/m. Wt Readings from Last 3 Encounters:  03/11/17 199 lb (90.3 kg)  12/29/16 197 lb 12.8 oz (89.7 kg)  10/07/16 195 lb (88.5 kg)      Physical Exam  Constitutional: He appears well-developed and well-nourished. No distress.  HENT:  Head: Normocephalic and atraumatic.  Right Ear: External ear normal.  Left Ear: External ear normal.  Nose: Nose normal.  Mouth/Throat: Oropharynx is clear and moist.  Dentures. Missing some teeth.  Eyes: Conjunctivae and EOM are normal. Pupils are equal, round, and reactive to light.  Neck: No JVD present. No tracheal deviation present. No thyromegaly present.  Cardiovascular: Normal rate, regular rhythm, normal heart sounds and intact distal pulses.  Exam reveals no gallop and no friction rub.   No murmur heard. Pulmonary/Chest: No respiratory distress. He has no wheezes. He has no rales. He exhibits no tenderness.  Abdominal: He exhibits no distension and no mass. There is no tenderness. There is no rebound and no guarding. No hernia.  post laparoscopic cholecystectomy scars.  Mild diastasis recti.  Genitourinary:  Genitourinary Comments: Enlarged prostate noted on prior exam.  Musculoskeletal: He exhibits no edema or tenderness (anterior to the right lateral malleolus has resolved).  Neurological: He displays normal reflexes. No cranial nerve deficit. Coordination normal.  Skin: No rash noted. No erythema. No pallor.  Psychiatric: He has a normal mood and affect. His behavior is normal. Judgment and thought content normal.  Nursing note and vitals reviewed.   Labs reviewed: Lab Summary Latest Ref Rng &  Units 10/07/2016 05/02/2016  Hemoglobin 13.2 - 17.1 g/dL 16.4 (None)  Hematocrit 38.5 - 50.0 % 48.7 (None)  White count 3.8 - 10.8 K/uL 5.7 (None)  Platelet count 140 - 400 K/uL 251 (None)  Sodium 135 - 146 mmol/L 140 143  Potassium 3.5 - 5.3 mmol/L 4.2 5.2  Calcium 8.6 - 10.3 mg/dL 9.0 9.5  Phosphorus - (None) (None)  Creatinine 0.70 - 1.18 mg/dL 1.13 1.13  AST 10 - 35 U/L 24 23  Alk Phos 40 - 115 U/L 43 50  Bilirubin 0.2 - 1.2 mg/dL 1.3(H) 0.9  Glucose 65 - 99 mg/dL 96 99  Cholesterol 125 - 200 mg/dL 165 (None)  HDL cholesterol >=40 mg/dL 55 56  Triglycerides <150 mg/dL 85 69  LDL Direct - (None) (None)  LDL Calc <130 mg/dL 93 98  Total protein 6.1 - 8.1 g/dL 6.7 (None)  Albumin 3.6 - 5.1 g/dL 3.9 4.1  Some recent data might be  hidden   Lab Results  Component Value Date   TSH 3.340 10/17/2014   TSH 2.20 09/12/2014   TSH 2.039 01/29/2011   Lab Results  Component Value Date   BUN 17 10/07/2016   BUN 13 05/02/2016   BUN 19 10/30/2015   Lab Results  Component Value Date   HGBA1C 5.8 (H) 10/07/2016   HGBA1C (H) 01/17/2011    6.0 (NOTE)                                                                       According to the ADA Clinical Practice Recommendations for 2011, when HbA1c is used as a screening test:   >=6.5%   Diagnostic of Diabetes Mellitus           (if abnormal result  is confirmed)  5.7-6.4%   Increased risk of developing Diabetes Mellitus  References:Diagnosis and Classification of Diabetes Mellitus,Diabetes GXQJ,1941,74(YCXKG 1):S62-S69 and Standards of Medical Care in         Diabetes - 2011,Diabetes Care,2011,34  (Suppl 1):S11-S61.    Assessment/Plan  1. Essential hypertension The current medical regimen is effective;  continue present plan and medications.   Letter of disability composed

## 2017-03-11 NOTE — Addendum Note (Signed)
Addended by: Estill Dooms on: 03/11/2017 02:33 PM   Modules accepted: Orders

## 2017-03-18 ENCOUNTER — Telehealth: Payer: Self-pay | Admitting: *Deleted

## 2017-03-18 NOTE — Telephone Encounter (Signed)
Patient called and stated that he saw you last week and you were going to compose a letter for him for the [place] regarding his history. Patient is just calling for the status of the letter. Please Advise.

## 2017-03-18 NOTE — Telephone Encounter (Signed)
Still working on it.

## 2017-03-25 ENCOUNTER — Encounter: Payer: Self-pay | Admitting: Internal Medicine

## 2017-03-25 ENCOUNTER — Ambulatory Visit (INDEPENDENT_AMBULATORY_CARE_PROVIDER_SITE_OTHER): Payer: Medicare Other | Admitting: Internal Medicine

## 2017-03-25 VITALS — BP 122/78 | HR 67 | Temp 97.5°F | Ht 70.0 in | Wt 199.0 lb

## 2017-03-25 DIAGNOSIS — K862 Cyst of pancreas: Secondary | ICD-10-CM

## 2017-03-25 DIAGNOSIS — C44301 Unspecified malignant neoplasm of skin of nose: Secondary | ICD-10-CM | POA: Diagnosis not present

## 2017-03-25 NOTE — Progress Notes (Signed)
Facility  [place]    Place of Service:   OFFICE    Allergies  Allergen Reactions  . Heparin     Blood clots   . Penicillin G Shortness Of Breath  . Levaquin [Levofloxacin In D5w] Other (See Comments)    Patient felt jittery and nervous with insomnia on the medication.  . Aspirin     Breathing complications, can tolerate baby asa  . Codeine     Some is ok  . Oxycodone     Can not take percocet, palpitations  . Sucralfate     Could not sleep  . Zetia [Ezetimibe]     jittery  . Zocor [Simvastatin]     Liver enzymes increased  . Plavix [Clopidogrel Bisulfate] Palpitations    Chief Complaint  Patient presents with  . Medical Management of Chronic Issues    discuss MRI done 05/29/16. It was recommend follow-up in one year  with and without contrast, for pancreatic cyst.     HPI:   Pancreas cyst  - needs follow up MRI pancreas  Skin cancer of nose - noted a small raised papule of the right nostril inferiorly on entry      Medications: Patient's Medications  New Prescriptions   No medications on file  Previous Medications   ALBUTEROL (PROVENTIL HFA;VENTOLIN HFA) 108 (90 BASE) MCG/ACT INHALER    Inhale 2 puffs into the lungs every 6 (six) hours as needed for wheezing.   ALPRAZOLAM (XANAX) 0.5 MG TABLET    TAKE 1-2 TABLET BY MOUTH EVERY DAILY TO HELP WITH ANXIETY   ASPIRIN 81 MG TABLET    Take 81 mg by mouth 3 (three) times a week. Mondays, wednesdays and fridays   CHOLECALCIFEROL (VITAMIN D) 1000 UNITS TABLET    Take 1,000 Units by mouth daily.   DIAZEPAM (VALIUM) 5 MG TABLET    Take one or two tablets by mouth to help rest   OMEPRAZOLE (PRILOSEC) 20 MG CAPSULE    Take 20 mg by mouth daily as needed (heartburn).   ROSUVASTATIN (CRESTOR) 10 MG TABLET    Take 1 tablet (10 mg total) by mouth daily.  Modified Medications   No medications on file  Discontinued Medications   AMITRIPTYLINE (ELAVIL) 25 MG TABLET    Take 1/2 tablet at bedtime for rest and to relax muscles     Review of Systems  Constitutional: Positive for fatigue. Negative for activity change, appetite change, chills, fever and unexpected weight change.  HENT: Positive for hearing loss. Negative for congestion, ear pain, rhinorrhea, sore throat, tinnitus, trouble swallowing and voice change.   Eyes: Negative.        Corrective lenses  Respiratory: Positive for wheezing. Negative for cough, choking, chest tightness and shortness of breath.   Cardiovascular: Negative for chest pain, palpitations and leg swelling.       Hx CAD and PAF  Gastrointestinal: Negative for abdominal distention, abdominal pain, constipation, diarrhea and nausea.       Cholecystectomy in Dec 2014.  Endocrine: Negative.  Negative for cold intolerance, heat intolerance, polydipsia, polyphagia and polyuria.  Genitourinary: Positive for frequency. Negative for dysuria, testicular pain and urgency.       BPH  Musculoskeletal: Positive for back pain (hx lumbar stenosis). Negative for arthralgias, gait problem, myalgias and neck pain.  Skin: Negative for color change, pallor and rash.       Hx of basal and squamous cell cancers removed from face and nose.  Allergic/Immunologic: Negative.  Neurological: Negative.  Negative for dizziness, tremors, syncope, speech difficulty, weakness, numbness and headaches.  Hematological: Negative.  Negative for adenopathy. Does not bruise/bleed easily.  Psychiatric/Behavioral: Positive for dysphoric mood and sleep disturbance. Negative for behavioral problems, confusion, decreased concentration and hallucinations. The patient is nervous/anxious.        Patient relates previous thoughts of self destruction and other obsessional intrusive thoughts. He continues to use both Valium for rest and Xanax for daytime anxiety. These drugs are helpful with controlling obsessive thoughts and impulses according to the patient.    Vitals:   03/25/17 1201  BP: 122/78  Pulse: 67  Temp: 97.5 F (36.4  C)  TempSrc: Oral  SpO2: 96%  Weight: 199 lb (90.3 kg)  Height: '5\' 10"'  (1.778 m)   Body mass index is 28.55 kg/m. Wt Readings from Last 3 Encounters:  03/25/17 199 lb (90.3 kg)  03/11/17 199 lb (90.3 kg)  12/29/16 197 lb 12.8 oz (89.7 kg)      Physical Exam  Constitutional: He appears well-developed and well-nourished. No distress.  HENT:  Head: Normocephalic and atraumatic.  Right Ear: External ear normal.  Left Ear: External ear normal.  Nose: Nose normal.  Mouth/Throat: Oropharynx is clear and moist.  Dentures. Missing some teeth.  Eyes: Conjunctivae and EOM are normal. Pupils are equal, round, and reactive to light.  Neck: No JVD present. No tracheal deviation present. No thyromegaly present.  Cardiovascular: Normal rate, regular rhythm, normal heart sounds and intact distal pulses.  Exam reveals no gallop and no friction rub.   No murmur heard. Pulmonary/Chest: No respiratory distress. He has no wheezes. He has no rales. He exhibits no tenderness.  Abdominal: He exhibits no distension and no mass. There is no tenderness. There is no rebound and no guarding. No hernia.  post laparoscopic cholecystectomy scars.  Mild diastasis recti.  Genitourinary:  Genitourinary Comments: Enlarged prostate noted on prior exam.  Musculoskeletal: He exhibits no edema or tenderness (anterior to the right lateral malleolus has resolved).  Neurological: He displays normal reflexes. No cranial nerve deficit. Coordination normal.  Skin: No rash noted. No erythema. No pallor.  2 mm papule of the right nares on entry inferiorly  Psychiatric: He has a normal mood and affect. His behavior is normal. Judgment and thought content normal.  Nursing note and vitals reviewed.   Labs reviewed: Lab Summary Latest Ref Rng & Units 10/07/2016 05/02/2016  Hemoglobin 13.2 - 17.1 g/dL 16.4 (None)  Hematocrit 38.5 - 50.0 % 48.7 (None)  White count 3.8 - 10.8 K/uL 5.7 (None)  Platelet count 140 - 400 K/uL  251 (None)  Sodium 135 - 146 mmol/L 140 143  Potassium 3.5 - 5.3 mmol/L 4.2 5.2  Calcium 8.6 - 10.3 mg/dL 9.0 9.5  Phosphorus - (None) (None)  Creatinine 0.70 - 1.18 mg/dL 1.13 1.13  AST 10 - 35 U/L 24 23  Alk Phos 40 - 115 U/L 43 50  Bilirubin 0.2 - 1.2 mg/dL 1.3(H) 0.9  Glucose 65 - 99 mg/dL 96 99  Cholesterol 125 - 200 mg/dL 165 (None)  HDL cholesterol >=40 mg/dL 55 56  Triglycerides <150 mg/dL 85 69  LDL Direct - (None) (None)  LDL Calc <130 mg/dL 93 98  Total protein 6.1 - 8.1 g/dL 6.7 (None)  Albumin 3.6 - 5.1 g/dL 3.9 4.1  Some recent data might be hidden   Lab Results  Component Value Date   TSH 3.340 10/17/2014   TSH 2.20 09/12/2014   TSH 2.039 01/29/2011  Lab Results  Component Value Date   BUN 17 10/07/2016   BUN 13 05/02/2016   BUN 19 10/30/2015   Lab Results  Component Value Date   HGBA1C 5.8 (H) 10/07/2016   HGBA1C (H) 01/17/2011    6.0 (NOTE)                                                                       According to the ADA Clinical Practice Recommendations for 2011, when HbA1c is used as a screening test:   >=6.5%   Diagnostic of Diabetes Mellitus           (if abnormal result  is confirmed)  5.7-6.4%   Increased risk of developing Diabetes Mellitus  References:Diagnosis and Classification of Diabetes Mellitus,Diabetes HQPR,9163,84(YKZLD 1):S62-S69 and Standards of Medical Care in         Diabetes - 2011,Diabetes Care,2011,34  (Suppl 1):S11-S61.    Assessment/Plan  1. Pancreas cyst - MR Abdomen W Wo Contrast; Future  2. Skin cancer of nose - Ambulatory referral to Dermatology: Dr. Sarajane Jews

## 2017-03-26 DIAGNOSIS — D485 Neoplasm of uncertain behavior of skin: Secondary | ICD-10-CM | POA: Diagnosis not present

## 2017-03-26 DIAGNOSIS — L739 Follicular disorder, unspecified: Secondary | ICD-10-CM | POA: Diagnosis not present

## 2017-04-02 DIAGNOSIS — H26493 Other secondary cataract, bilateral: Secondary | ICD-10-CM | POA: Diagnosis not present

## 2017-04-02 DIAGNOSIS — H04123 Dry eye syndrome of bilateral lacrimal glands: Secondary | ICD-10-CM | POA: Diagnosis not present

## 2017-04-02 DIAGNOSIS — Z961 Presence of intraocular lens: Secondary | ICD-10-CM | POA: Diagnosis not present

## 2017-04-02 DIAGNOSIS — H43812 Vitreous degeneration, left eye: Secondary | ICD-10-CM | POA: Diagnosis not present

## 2017-04-27 ENCOUNTER — Encounter: Payer: Self-pay | Admitting: Gastroenterology

## 2017-04-27 ENCOUNTER — Ambulatory Visit (INDEPENDENT_AMBULATORY_CARE_PROVIDER_SITE_OTHER): Payer: Medicare Other | Admitting: Gastroenterology

## 2017-04-27 VITALS — BP 110/60 | HR 76 | Ht 68.75 in | Wt 196.6 lb

## 2017-04-27 DIAGNOSIS — Z8619 Personal history of other infectious and parasitic diseases: Secondary | ICD-10-CM | POA: Diagnosis not present

## 2017-04-27 DIAGNOSIS — R1013 Epigastric pain: Secondary | ICD-10-CM | POA: Diagnosis not present

## 2017-04-27 DIAGNOSIS — K5730 Diverticulosis of large intestine without perforation or abscess without bleeding: Secondary | ICD-10-CM

## 2017-04-27 DIAGNOSIS — K589 Irritable bowel syndrome without diarrhea: Secondary | ICD-10-CM

## 2017-04-27 DIAGNOSIS — K219 Gastro-esophageal reflux disease without esophagitis: Secondary | ICD-10-CM

## 2017-04-27 MED ORDER — OMEPRAZOLE 20 MG PO CPDR: 20.0000 mg | DELAYED_RELEASE_CAPSULE | Freq: Every day | ORAL | 6 refills | Status: DC | PRN

## 2017-04-27 NOTE — Patient Instructions (Signed)
Use VSL # 3 112 B units daily  IB Gard 1 capsule three times  A day as needed  We will send omeprazole 20 mg daily  Follow up as needed

## 2017-04-27 NOTE — Progress Notes (Signed)
Alexander [name]    [id number]    06-Mar-1936  Primary Care Physician:Green, Viviann Spare, MD  Referring Physician: Estill Dooms, [place], [place] [address]  Chief complaint:  Irritable bowel syndrome, abdominal pain  HPI: 81 year old male previously followed by Dr. [name], was last seen by Dr. Havery Moros 02/05/2016 is here for follow-up visit after patient  requested to switch provider. He has history of irritable bowel syndrome, diverticulosis, H. pylori gastritis treated with different regimens 3 and cleared finally with amoxicillin and Biaxin. She and had an episode of abdominal cramps and bloating last month, he started eating cereal with 7 grains, high protein and was adding walnuts and raisins to the cereal. He noticed improvement once he stopped eating the cereal along with nuts and raisins. H. pylori antigen negative in September 2017 Diverticulosis. He took omeprazole for 10 days last month and he stopped. He currently denies any abdominal pain, heartburn, dysphagia, odynophagia, nausea or vomiting.   EGD 2015 with evidence of H. pylori gastritis Colonoscopy 2013 with diverticulosis and internal hemorrhoids  MRI abdomen June 2017: 44mm Pancreatic cyst and numerous small benign-appearing hepatic cysts  Outpatient Encounter Prescriptions as of 04/27/2017  Medication Sig  . albuterol (PROVENTIL HFA;VENTOLIN HFA) 108 (90 BASE) MCG/ACT inhaler Inhale 2 puffs into the lungs every 6 (six) hours as needed for wheezing.  Marland Kitchen ALPRAZolam (XANAX) 0.5 MG tablet TAKE 1-2 TABLET BY MOUTH EVERY DAILY TO HELP WITH ANXIETY  . aspirin 81 MG tablet Take 81 mg by mouth 3 (three) times a week. Mondays, wednesdays and fridays  . cholecalciferol (VITAMIN D) 1000 UNITS tablet Take 1,000 Units by mouth daily.  . diazepam (VALIUM) 5 MG tablet Take one or two tablets by mouth to help rest  . omeprazole (PRILOSEC) 20 MG capsule Take 20 mg by mouth daily as needed (heartburn).  .  rosuvastatin (CRESTOR) 10 MG tablet Take 1 tablet (10 mg total) by mouth daily.   No facility-administered encounter medications on file as of 04/27/2017.     Allergies as of 04/27/2017 - Review Complete 04/27/2017  Allergen Reaction Noted  . Heparin  08/25/2011  . Penicillin g Shortness Of Breath 11/06/2015  . Levaquin [levofloxacin in d5w] Other (See Comments) 02/29/2016  . Aspirin  08/25/2011  . Codeine  04/13/2013  . Oxycodone  04/13/2013  . Sucralfate  09/04/2015  . Zetia [ezetimibe]  05/31/2014  . Zocor [simvastatin]  04/13/2013  . Plavix [clopidogrel bisulfate] Palpitations 04/13/2013    Past Medical History:  Diagnosis Date  . Allergic rhinitis due to pollen   . Anginal pain ([place])    once in a while, none recent  . Anxiety   . Anxiety disorder   . Asthma    exercise induced  . Atrial fibrillation (HCC)    hx of  . Basal cell carcinoma of skin of lip   . Clotting disorder ([place])   . Coronary atherosclerosis of native coronary artery    with CABG 3/12  -MAZE procedure   . Diastasis recti 05/07/2016  . Difficulty urinating   . Diverticulosis of colon (without mention of hemorrhage)    mild  . Dysuria   . Edema   . Essential hypertension, benign   . Heart attack ([place]) 2012  . Helicobacter pylori (H. pylori)   . 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 History of anal fissures   . History of cholelithiasis   .  Hyperlipidemia   . Hypertrophy of prostate with urinary obstruction and other lower urinary tract symptoms (LUTS)   . Impotence of organic origin   . Internal hemorrhoids without mention of complication   . Jaundice age 77  . Long term (current) use of anticoagulants   . Neutropenia ([place]) 10/07/2016  . Nonspecific elevation of levels of transaminase or lactic acid dehydrogenase (LDH)   . Other voice and resonance disorders    after surgery, took breathing tube out and damaged something  . Pneumonia   . Pulmonary embolism ([place]) 2012   s/p heart  surgery  . Pulmonary embolism ([place])    3/12  . Rheumatic fever age 8  . Skin cancer    Hx of squamous cell x2  . Sleep apnea    no CPAP  . Unspecified constipation     Past Surgical History:  Procedure Laterality Date  . CARDIAC CATHETERIZATION    . CHOLECYSTECTOMY N/A 11/18/2013   Procedure: LAPAROSCOPIC CHOLECYSTECTOMY With IOC;  Surgeon: Odis Hollingshead, MD;  Location: [facility];  Service: General;  Laterality: N/A;  . CORONARY ARTERY BYPASS GRAFT  2012   Median sternotomy, extracorporeal circulation, coronary  . HERNIA REPAIR  2010   bilateral  . HYDROCELE EXCISION     dr [name]   . MOHS SURGERY     squamus x2; BCC fo bridge of nose June 2017.  Marland Kitchen SCROTUM EXPLORATION  1990's   multiple  . SPERMATOCELECTOMY  02/1999   Right, Dr.[name]  . SUPERFICIAL SKIN CYSTECTOMY (l) HIP Right 1997   dr [name]  . VASECTOMY  04/1992    Family History  Problem Relation Age of Onset  . Bone cancer Mother   . Diabetes Mother   . Colon cancer Neg Hx   . Esophageal cancer Neg Hx   . Rectal cancer Neg Hx   . Stomach cancer Neg Hx   . Lung disease Neg Hx     Social History   Social History  . Marital status: Married    Spouse name: N/A  . Number of children: 1  . Years of education: N/A   Occupational History  . Car Salesman-retired [employer]   Social History Main Topics  . Smoking status: Former Smoker    Packs/day: 1.00    Years: 22.00    Types: Cigarettes    Start date: 08/22/1956    Quit date: 12/08/1977  . Smokeless tobacco: Never Used     Comment: significant second-hand smoke exposure at work  . Alcohol use 0.0 oz/week     Comment: former heavy alcohol use, rare use  . Drug use: No  . Sexual activity: Yes    Partners: Female   Other Topics Concern  . Not on file   Social History Narrative   Daily caffeine       [name]   [name]   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 No pets currently. No hot tub or bird exposure.    Married wife Cristie Hem      Review of systems: Review of Systems  Constitutional: Negative for fever and chills.  positive for lack of energy HENT: Positive for  sinus problems  Eyes: Negative for blurred vision.  Respiratory: Negative for cough, shortness of breath and positive for wheezing.   Cardiovascular: Negative for chest pain and palpitations.  Gastrointestinal: as per HPI Genitourinary: Negative for dysuria, urgency, frequency and hematuria.  Musculoskeletal: Positive for myalgias, back pain and joint pain.  Skin: Negative for itching and rash.  Neurological: Negative for dizziness, tremors, focal weakness, seizures and loss of consciousness.  Endo/Heme/Allergies: Positive for seasonal allergies.  Psychiatric/Behavioral: Negative for depression, suicidal ideas and hallucinations.  positive for anxiety All other systems reviewed and are negative.   Physical Exam: Vitals:   04/27/17 1434 04/27/17 1440  BP: (!) 98/56 110/60  Pulse: 76    Body mass index is 29.24 kg/m. Gen:      No acute distress HEENT:  EOMI, sclera anicteric Neck:     No masses; no thyromegaly Lungs:    Clear to auscultation bilaterally; normal respiratory effort CV:         Regular rate and rhythm; no murmurs Abd:      + bowel sounds; soft, non-tender; no palpable masses, no distension Ext:    No edema; adequate peripheral perfusion Skin:      Warm and dry; no rash Neuro: alert and oriented x 3 Psych: normal mood and affect  Data Reviewed:  Reviewed labs, radiology imaging, old records and pertinent past GI work  up   Assessment and Plan/Recommendations:  81 year old male with history of CAD, CABG, anxiety disorder, irritable bowel syndrome, GERD, H. pylori gastritis status post treatment with documented evidence of clearance here for follow-up visit  He had an episode of increased bloating associated with abdominal cramps setting of increased fiber intake, which resolved after he changed his diet We'll do a trial of probiotic VSL#3 112 billion units daily IB gard 1 capsule TID as needed for abdominal cramps  Has intermittent breakthrough heartburn and epigastric abdominal pain Advised patient to use omeprazole 20 mg as needed Discussed dietary and lifestyle modifications to decrease reflux symptoms  Colorectal cancer screening: Average risk Colonoscopy 2013 with internal hemorrhoids and diverticulosis, no polyps.   25 minutes was spent face-to-face with the patient. Greater than 50% of the time used for counseling as well as treatment plan and follow-up of irritable bowel syndrome and GERD. He had multiple questions which were answered to his satisfaction  K. Denzil Magnuson , MD [phone] Mon-Fri 8a-5p [phone] after 5p, weekends, holidays  CC: Estill Dooms, MD

## 2017-05-21 ENCOUNTER — Ambulatory Visit: Payer: Medicare Other | Admitting: Internal Medicine

## 2017-05-22 ENCOUNTER — Ambulatory Visit
Admission: RE | Admit: 2017-05-22 | Discharge: 2017-05-22 | Disposition: A | Discharge status: Home / Self Care | Payer: Medicare Other | Source: Ambulatory Visit | Attending: Internal Medicine | Admitting: Internal Medicine

## 2017-05-22 DIAGNOSIS — K862 Cyst of pancreas: Secondary | ICD-10-CM | POA: Diagnosis not present

## 2017-05-22 MED ORDER — GADOBENATE DIMEGLUMINE 529 MG/ML IV SOLN
18.0000 mL | Freq: Once | INTRAVENOUS | Status: AC | PRN
  Administered 2017-05-22: 18 mL via INTRAVENOUS

## 2017-05-25 ENCOUNTER — Ambulatory Visit (INDEPENDENT_AMBULATORY_CARE_PROVIDER_SITE_OTHER): Payer: Medicare Other | Admitting: Nurse Practitioner

## 2017-05-25 ENCOUNTER — Encounter: Payer: Self-pay | Admitting: Nurse Practitioner

## 2017-05-25 VITALS — BP 132/74 | HR 65 | Temp 97.7°F | Resp 17 | Ht 69.0 in | Wt 191.8 lb

## 2017-05-25 DIAGNOSIS — F411 Generalized anxiety disorder: Secondary | ICD-10-CM

## 2017-05-25 DIAGNOSIS — K862 Cyst of pancreas: Secondary | ICD-10-CM | POA: Diagnosis not present

## 2017-05-25 DIAGNOSIS — R5382 Chronic fatigue, unspecified: Secondary | ICD-10-CM

## 2017-05-25 NOTE — Progress Notes (Signed)
Careteam: Patient Care Team: [name], DO as PCP - General (Geriatric Medicine) Javier Glazier, MD as Consulting Physician (Pulmonary Disease) Danella Sensing, MD as Consulting Physician (Dermatology) Jessy Oto, MD as Consulting Physician (Orthopedic Surgery) Irine Seal, MD as Attending Physician (Urology) Jackolyn Confer, MD as Consulting Physician (General Surgery) Melrose Nakayama, MD as Consulting Physician (Cardiothoracic Surgery) Rozetta Nunnery, MD as Consulting Physician (Otolaryngology) Griselda Miner, MD as Consulting Physician (Dermatology)  Advanced Directive information Does Patient Have a Medical Advance Directive?: No, Would patient like information on creating a medical advance directive?: Yes (MAU/Ambulatory/Procedural Areas - Information given)  Allergies  Allergen Reactions  . Heparin     Blood clots   . Penicillin G Shortness Of Breath  . Levaquin [Levofloxacin In D5w] Other (See Comments)    Patient felt jittery and nervous with insomnia on the medication.  . Aspirin     Breathing complications, can tolerate baby asa  . Codeine     Some is ok  . Oxycodone     Can not take percocet, palpitations  . Sucralfate     Could not sleep  . Zetia [Ezetimibe]     jittery  . Zocor [Simvastatin]     Liver enzymes increased  . Plavix [Clopidogrel Bisulfate] Palpitations    Chief Complaint  Patient presents with  . Acute Visit    Pt is being seen for numbness in both shins x 3-4 weeks and excessive tiredness x 1 1/2 month.     HPI: Patient is a 81 y.o. male seen in the office today with multiple questions.  Would like to go over MR of the abdomen due to pancreas cyst. Urine smells strong- but this got better Had some numbness in front of both lower legs but now its gone. Would like to have his lungs listen to- does not use albuterol  Increase in fatigue- had to take nap yesterday which is unusual. Recently had bronchitis but  this has improved.  Using 1/2 tablet of xanax and 1 using 5 mg of valium every night-- states this does NOT make him sluggish.  States crestor will make him tired.  Reports he does have anxiety and had times depression.  Reports he has tired trazodone, paxil and zoloft which made him worse In an support group at [place] for anxiety and OCD. Also seeing geripsychiatrist at the [place].  Occasionally will have a glass of wine or mixed drink. Has been out of the gym/working out since Oct. 2017 Review of Systems:  Review of Systems  Constitutional: Positive for malaise/fatigue. Negative for chills, fever and weight loss.  HENT: Negative for tinnitus.   Respiratory: Negative for cough, sputum production and shortness of breath.   Cardiovascular: Negative for chest pain, palpitations and leg swelling.  Gastrointestinal: Negative for abdominal pain, constipation, diarrhea and heartburn.  Genitourinary: Negative for dysuria, frequency and urgency.  Musculoskeletal: Negative for back pain, falls, joint pain and myalgias.  Skin: Negative.   Neurological: Negative for dizziness and headaches.  Psychiatric/Behavioral: Negative for depression and memory loss. The patient is nervous/anxious. The patient does not have insomnia.     Past Medical History:  Diagnosis Date  . Allergic rhinitis due to pollen   . Anginal pain ([place])    once in a while, none recent  . Anxiety   . Anxiety disorder   . Asthma    exercise induced  . Atrial fibrillation (HCC)    hx of  . Basal cell  carcinoma of skin of lip   . Clotting disorder ([place])   . Coronary atherosclerosis of native coronary artery    with CABG 3/12  -MAZE procedure   . Diastasis recti 05/07/2016  . Difficulty urinating   . Diverticulosis of colon (without mention of hemorrhage)    mild  . Dysuria   . Edema   . Essential hypertension, benign   . Heart attack ([place]) 2012  . Helicobacter pylori (H. pylori)   . 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 History of anal fissures   . History of cholelithiasis   . Hyperlipidemia   . Hypertrophy of prostate with urinary obstruction and other lower urinary tract symptoms (LUTS)   . Impotence of organic origin   . Internal hemorrhoids without mention of complication   . Jaundice age 55  . Long term (current) use of anticoagulants   . Neutropenia ([place]) 10/07/2016  . Nonspecific elevation of levels of transaminase or lactic acid dehydrogenase (LDH)   . Other voice and resonance disorders    after surgery, took breathing tube out and damaged something  . Pneumonia   . Pulmonary embolism ([place]) 2012   s/p heart surgery  . Pulmonary embolism ([place])    3/12  . Rheumatic fever age 35  . Skin cancer    Hx of squamous cell x2  . Sleep apnea    no CPAP  . Unspecified constipation    Past Surgical History:  Procedure Laterality Date  . CARDIAC CATHETERIZATION    . CHOLECYSTECTOMY N/A 11/18/2013   Procedure: LAPAROSCOPIC CHOLECYSTECTOMY With IOC;  Surgeon: Odis Hollingshead, MD;  Location: [facility];  Service: General;  Laterality: N/A;  . CORONARY ARTERY BYPASS GRAFT  2012   Median sternotomy, extracorporeal circulation, coronary  . HERNIA REPAIR  2010   bilateral  . HYDROCELE EXCISION     dr [name]   . MOHS SURGERY     squamus x2; BCC fo bridge of nose June 2017.  Marland Kitchen SCROTUM EXPLORATION  1990's   multiple  . SPERMATOCELECTOMY  02/1999   Right, Dr.[name]  . SUPERFICIAL SKIN CYSTECTOMY (l) HIP Right 1997   dr [name]  . VASECTOMY  04/1992   Social History:   reports that he quit smoking about 39 years ago. His smoking use included Cigarettes. He started smoking about 60 years ago. He has a 22.00 pack-year smoking history. He has never used smokeless tobacco. He reports that he drinks alcohol. He reports that he does not use drugs.  Family History  Problem Relation Age of Onset  . Bone cancer Mother   . Diabetes Mother   . Colon cancer Neg Hx   . Esophageal cancer Neg Hx   . Rectal  cancer Neg Hx   . Stomach cancer Neg Hx   . Lung disease Neg Hx     Medications: Patient's Medications  New Prescriptions   No medications on file  Previous Medications   ALBUTEROL (PROVENTIL HFA;VENTOLIN HFA) 108 (90 BASE) MCG/ACT INHALER    Inhale 2 puffs into the lungs every 6 (six) hours as needed for wheezing.   ALPRAZOLAM (XANAX) 0.5 MG TABLET    TAKE 1-2 TABLET BY MOUTH EVERY DAILY TO HELP WITH ANXIETY   ASPIRIN 81 MG TABLET    Take 81 mg by mouth 3 (three) times a week. Mondays, wednesdays and fridays   CHOLECALCIFEROL (VITAMIN D) 1000 UNITS TABLET    Take 1,000 Units by mouth daily.   DIAZEPAM (VALIUM)  5 MG TABLET    Take one or two tablets by mouth to help rest   OMEPRAZOLE (PRILOSEC) 20 MG CAPSULE    Take 1 capsule (20 mg total) by mouth daily as needed (heartburn).   ROSUVASTATIN (CRESTOR) 10 MG TABLET    Take 1 tablet (10 mg total) by mouth daily.  Modified Medications   No medications on file  Discontinued Medications   No medications on file     Physical Exam:  Vitals:   05/25/17 1455  BP: 132/74  Pulse: 65  Resp: 17  Temp: 97.7 F (36.5 C)  TempSrc: Oral  SpO2: 98%  Weight: 191 lb 12.8 oz (87 kg)  Height: 5\' 9"  (1.753 m)   Body mass index is 28.32 kg/m.  Physical Exam  Constitutional: He appears well-developed and well-nourished. No distress.  HENT:  Head: Normocephalic and atraumatic.  Right Ear: External ear normal.  Left Ear: External ear normal.  Nose: Nose normal.  Mouth/Throat: Oropharynx is clear and moist.  Dentures. Missing some teeth.  Eyes: Conjunctivae and EOM are normal. Pupils are equal, round, and reactive to light.  Neck: No JVD present. No tracheal deviation present. No thyromegaly present.  Cardiovascular: Normal rate, regular rhythm and normal heart sounds.   Pulmonary/Chest: Effort normal and breath sounds normal.  Musculoskeletal: He exhibits no edema or tenderness.  Neurological: He displays normal reflexes. No cranial nerve  deficit. Coordination normal.  Skin: No rash noted. No erythema. No pallor.  Psychiatric: He has a normal mood and affect. His behavior is normal. Judgment and thought content normal.  Nursing note and vitals reviewed.   Labs reviewed: Basic Metabolic Panel:  Recent Labs  10/07/16 0950  NA 140  K 4.2  CL 103  CO2 26  GLUCOSE 96  BUN 17  CREATININE 1.13  CALCIUM 9.0   Liver Function Tests:  Recent Labs  10/07/16 0950  AST 24  ALT 17  ALKPHOS 43  BILITOT 1.3*  PROT 6.7  ALBUMIN 3.9   No results for input(s): LIPASE, AMYLASE in the last 8760 hours. No results for input(s): AMMONIA in the last 8760 hours. CBC:  Recent Labs  10/07/16 1546  WBC 5.7  NEUTROABS 3,363  HGB 16.4  HCT 48.7  MCV 94.2  PLT 251   Lipid Panel:  Recent Labs  10/07/16 0950  CHOL 165  HDL 55  LDLCALC 93  TRIG 85  CHOLHDL 3.0   TSH: No results for input(s): TSH in the last 8760 hours. A1C: Lab Results  Component Value Date   HGBA1C 5.8 (H) 10/07/2016   Mr Abdomen W Wo Contrast  Result Date: 05/22/2017 CLINICAL DATA:  Followup of pancreatic cystic lesion. EXAM: MRI ABDOMEN WITHOUT AND WITH CONTRAST TECHNIQUE: Multiplanar multisequence MR imaging of the abdomen was performed both before and after the administration of intravenous contrast. CONTRAST:  40mL MULTIHANCE GADOBENATE DIMEGLUMINE 529 MG/ML IV SOLN Creatinine was obtained on site at [place] at [address] Results: Creatinine 1.1 mg/dL. COMPARISON:  05/29/2016 FINDINGS: Lower chest: Mild cardiomegaly.  Prior median sternotomy. Hepatobiliary: Scattered hepatic cysts. Cholecystectomy, without biliary duct dilatation. No choledocholithiasis. Pancreas: No evidence of acute pancreatitis or main duct dilatation. 9 mm cystic lesion within the pancreatic body/ tail junction is unchanged on image 21/series 6. No suspicious characteristics. Spleen:  Normal in size, without focal abnormality. Adrenals/Urinary Tract:  Normal adrenal glands. Normal kidneys, without hydronephrosis. Stomach/Bowel: Normal stomach and abdominal bowel loops. Vascular/Lymphatic: Aortic Atherosclerosis (ICD10-I70.0). No retroperitoneal or retrocrural adenopathy.  Other:  No ascites. Musculoskeletal: No acute osseous abnormality. IMPRESSION: 1. No significant change in a 9 mm cystic lesion within the pancreatic body/tail junction. Most likely a pseudocyst or focus of side branch duct ectasia. Indolent cystic neoplasm could look similar. Per consensus criteria, this warrants follow-up with pre and post contrast abdominal MRI at 2 years. This recommendation follows ACR consensus guidelines: Management of Incidental Pancreatic Cysts: A White Paper of the ACR Incidental Findings Committee. [place] 2883;37:445-146. 2.  Aortic Atherosclerosis (ICD10-I70.0). Electronically Signed   By: Abigail Miyamoto M.D.   On: 05/22/2017 10:34    Assessment/Plan 1. Pancreas cyst -reviewed MRI with pt that was done on 4 days ago. Cyst remains stable and to follow up in 2 years  2. Anxiety state Clearly anxious. Reports he follows with Geripsych and support group. Will cont current regimen  3. Chronic fatigue -encouraged proper diet and to resume exercise as he can tolerate.     Carlos American. Harle Battiest  [facility] [phone] 8 am - 5 pm) [phone] (after hours)

## 2017-06-23 ENCOUNTER — Encounter: Payer: Self-pay | Admitting: Family Medicine

## 2017-06-23 ENCOUNTER — Ambulatory Visit (INDEPENDENT_AMBULATORY_CARE_PROVIDER_SITE_OTHER): Payer: Medicare Other | Admitting: Family Medicine

## 2017-06-23 VITALS — BP 124/66 | HR 67 | Temp 98.4°F | Resp 17 | Ht 69.0 in | Wt 189.2 lb

## 2017-06-23 DIAGNOSIS — S99921A Unspecified injury of right foot, initial encounter: Secondary | ICD-10-CM

## 2017-06-23 DIAGNOSIS — M79671 Pain in right foot: Secondary | ICD-10-CM

## 2017-06-23 NOTE — Patient Instructions (Signed)
     IF you received an x-ray today, you will receive an invoice from [facility]. Please contact [facility] at [phone] with questions or concerns regarding your invoice.   IF you received labwork today, you will receive an invoice from [facility]. Please contact [facility] at [phone] with questions or concerns regarding your invoice.   Our billing staff will not be able to assist you with questions regarding bills from these companies.  You will be contacted with the lab results as soon as they are available. The fastest way to get your results is to activate your My Chart account. Instructions are located on the last page of this paperwork. If you have not heard from us regarding the results in 2 weeks, please contact this office.     

## 2017-06-23 NOTE — Progress Notes (Signed)
Chief Complaint  Patient presents with  . Laceration    right foot, rusty wire penetrated foot,small red place    HPI   Patient reports that he stepped on a rusty wire that penetrated the foot and broke the skin He states that he cleaned it off but reports that he has some pressure but the skin was barely broken He states that he wanted to know if he needs a tetanus vaccine 1/09 He reports that with his last tetanus vaccine he has breathing problems and aggravated his asthma.  He reports that he came in today to review his vaccine and get it reviewed   Past Medical History:  Diagnosis Date  . Allergic rhinitis due to pollen   . Anginal pain ([place])    once in a while, none recent  . Anxiety   . Anxiety disorder   . Asthma    exercise induced  . Atrial fibrillation (HCC)    hx of  . Basal cell carcinoma of skin of lip   . Clotting disorder ([place])   . Coronary atherosclerosis of native coronary artery    with CABG 3/12  -MAZE procedure   . Diastasis recti 05/07/2016  . Difficulty urinating   . Diverticulosis of colon (without mention of hemorrhage)    mild  . Dysuria   . Edema   . Essential hypertension, benign   . Heart attack ([place]) 2012  . Helicobacter pylori (H. pylori)   . 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 History of anal fissures   . History of cholelithiasis   . Hyperlipidemia   . Hypertrophy of prostate with urinary obstruction and other lower urinary tract symptoms (LUTS)   . Impotence of organic origin   . Internal hemorrhoids without mention of complication   . Jaundice age 25  . Long term (current) use of anticoagulants   . Neutropenia ([place]) 10/07/2016  . Nonspecific elevation of levels of transaminase or lactic acid dehydrogenase (LDH)   . Other voice and resonance disorders    after surgery, took breathing tube out and damaged something  . Pneumonia   . Pulmonary embolism ([place]) 2012   s/p heart surgery  . Pulmonary embolism ([place])    3/12  . Rheumatic fever age 33  . Skin cancer    Hx of squamous cell x2  . Sleep apnea    no CPAP  . Unspecified constipation     Current Outpatient Prescriptions  Medication Sig Dispense Refill  . ALPRAZolam (XANAX) 0.5 MG tablet TAKE 1-2 TABLET BY MOUTH EVERY DAILY TO HELP WITH ANXIETY 60 tablet 5  . diazepam (VALIUM) 5 MG tablet Take one or two tablets by mouth to help rest 60 tablet 5  . albuterol (PROVENTIL HFA;VENTOLIN HFA) 108 (90 BASE) MCG/ACT inhaler Inhale 2 puffs into the lungs every 6 (six) hours as needed for wheezing. (Patient not taking: Reported on 06/23/2017) 1 Inhaler 0  . aspirin 81 MG tablet Take 81 mg by mouth 3 (three) times a week. Mondays, wednesdays and fridays    . cholecalciferol (VITAMIN D) 1000 UNITS tablet Take 1,000 Units by mouth daily.    Marland Kitchen omeprazole (PRILOSEC) 20 MG capsule Take 1 capsule (20 mg total) by mouth daily as needed (heartburn). (Patient not taking: Reported on 06/23/2017) 30 capsule 6  . rosuvastatin (CRESTOR) 10 MG tablet Take 1 tablet (10 mg total) by mouth daily. (Patient not taking: Reported on 06/23/2017) 30 tablet 11   No current facility-administered medications  for this visit.     Allergies:  Allergies  Allergen Reactions  . Heparin     Blood clots   . Penicillin G Shortness Of Breath  . Levaquin [Levofloxacin In D5w] Other (See Comments)    Patient felt jittery and nervous with insomnia on the medication.  . Aspirin     Breathing complications, can tolerate baby asa  . Codeine     Some is ok  . Oxycodone     Can not take percocet, palpitations  . Sucralfate     Could not sleep  . Zetia [Ezetimibe]     jittery  . Zocor [Simvastatin]     Liver enzymes increased  . Plavix [Clopidogrel Bisulfate] Palpitations    Past Surgical History:  Procedure Laterality Date  . CARDIAC CATHETERIZATION    . CHOLECYSTECTOMY N/A 11/18/2013   Procedure: LAPAROSCOPIC CHOLECYSTECTOMY With IOC;  Surgeon: Odis Hollingshead, MD;  Location: [facility]  ORS;  Service: General;  Laterality: N/A;  . CORONARY ARTERY BYPASS GRAFT  2012   Median sternotomy, extracorporeal circulation, coronary  . HERNIA REPAIR  2010   bilateral  . HYDROCELE EXCISION     dr [name]   . MOHS SURGERY     squamus x2; BCC fo bridge of nose June 2017.  Marland Kitchen SCROTUM EXPLORATION  1990's   multiple  . SPERMATOCELECTOMY  02/1999   Right, Dr.[name]  . SUPERFICIAL SKIN CYSTECTOMY (l) HIP Right 1997   dr [name]  . VASECTOMY  04/1992    Social History   Social History  . Marital status: Married    Spouse name: N/A  . Number of children: 1  . Years of education: N/A   Occupational History  . Car Salesman-retired [employer]   Social History Main Topics  . Smoking status: Former Smoker    Packs/day: 1.00    Years: 22.00    Types: Cigarettes    Start date: 08/22/1956    Quit date: 12/08/1977  . Smokeless tobacco: Never Used     Comment: significant second-hand smoke exposure at work  . Alcohol use 0.0 oz/week     Comment: former heavy alcohol use, rare use  . Drug use: No  . Sexual activity: Yes    Partners: Female   Other Topics Concern  . None   Social History Narrative   Daily caffeine       [name]   [name]   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 No pets currently. No hot tub or bird exposure.    Married wife Alex    ROS No fevers or chills No abscess or exudate  Objective: Vitals:   06/23/17 1135  BP: 124/66  Pulse: 67  Resp: 17  Temp: 98.4 F (36.9 C)  TempSrc: Oral  SpO2: 97%  Weight: 189 lb 3.2 oz (85.8 kg)    Height: 5\' 9"  (1.753 m)    Physical Exam  Constitutional: He appears well-developed and well-nourished.  HENT:  Head: Normocephalic and atraumatic.  Pulmonary/Chest: Effort normal.  Psychiatric: He has a normal mood and affect. His behavior is normal. Judgment and thought content normal.    Right foot with superficial laceration No  induration No fluctuance  Assessment and Plan Canon was seen today for laceration.  Diagnoses and all orders for this visit:  Injury of right foot, initial encounter Right foot pain  -  Tetanus up to date -  Continue to cover foot until healed   [place]

## 2017-07-14 DIAGNOSIS — L570 Actinic keratosis: Secondary | ICD-10-CM | POA: Diagnosis not present

## 2017-09-07 DIAGNOSIS — R3912 Poor urinary stream: Secondary | ICD-10-CM | POA: Diagnosis not present

## 2017-09-07 DIAGNOSIS — R350 Frequency of micturition: Secondary | ICD-10-CM | POA: Diagnosis not present

## 2017-09-07 DIAGNOSIS — L820 Inflamed seborrheic keratosis: Secondary | ICD-10-CM | POA: Diagnosis not present

## 2017-09-07 DIAGNOSIS — L308 Other specified dermatitis: Secondary | ICD-10-CM | POA: Diagnosis not present

## 2017-09-07 DIAGNOSIS — N401 Enlarged prostate with lower urinary tract symptoms: Secondary | ICD-10-CM | POA: Diagnosis not present

## 2017-09-10 ENCOUNTER — Ambulatory Visit (INDEPENDENT_AMBULATORY_CARE_PROVIDER_SITE_OTHER): Payer: Medicare Other | Admitting: Internal Medicine

## 2017-09-10 ENCOUNTER — Encounter: Payer: Self-pay | Admitting: Internal Medicine

## 2017-09-10 VITALS — BP 140/80 | HR 66 | Temp 98.0°F | Wt 191.0 lb

## 2017-09-10 DIAGNOSIS — I10 Essential (primary) hypertension: Secondary | ICD-10-CM

## 2017-09-10 DIAGNOSIS — M6208 Separation of muscle (nontraumatic), other site: Secondary | ICD-10-CM

## 2017-09-10 DIAGNOSIS — L309 Dermatitis, unspecified: Secondary | ICD-10-CM | POA: Diagnosis not present

## 2017-09-10 DIAGNOSIS — I209 Angina pectoris, unspecified: Secondary | ICD-10-CM | POA: Diagnosis not present

## 2017-09-10 DIAGNOSIS — B9681 Helicobacter pylori [H. pylori] as the cause of diseases classified elsewhere: Secondary | ICD-10-CM | POA: Diagnosis not present

## 2017-09-10 DIAGNOSIS — K2970 Gastritis, unspecified, without bleeding: Secondary | ICD-10-CM | POA: Diagnosis not present

## 2017-09-10 DIAGNOSIS — I25810 Atherosclerosis of coronary artery bypass graft(s) without angina pectoris: Secondary | ICD-10-CM

## 2017-09-10 NOTE — Patient Instructions (Addendum)
Follow the directions from dermatology for your neck.    Come back for your flu shot in a couple of weeks and then we can give you your pneumonia vaccine (pneumovax) a couple weeks after that.

## 2017-09-10 NOTE — Progress Notes (Signed)
Location:  [facility] clinic Provider:  Carlye Panameno L. Mariea Clonts, D.O., C.M.D.  Code Status: full code Goals of Care:  Advanced Directives 05/25/2017  Does Alexander [name] Have a Medical Advance Directive? No  Type of Advance Directive -  Does Alexander [name] want to make changes to medical advance directive? -  Copy of [place] in Chart? -  Would Alexander [name] like information on creating a medical advance directive? Yes (MAU/Ambulatory/Procedural Areas - Information given)  Pre-existing out of facility DNR order (yellow form or pink MOST form) -   Chief Complaint  Alexander [name] presents with  . Medical Management of Chronic Issues    official new pt visit with me after Dr. Rolly Salter retirement    HPI: Alexander [name] is a 81 y.o. male seen today for medical management of chronic diseases.    He's walking less, but his ankle is better.  He'd sprained it and it's not perfect.  Last three mile walk was last Friday--not bothersome then.    This summer he had a new AC system put in--it took much of the summer.  He reports sweating a lot. He broke out a lot around his neck.  He saw Dr. Danella Sensing.  A couple of keratoses were burned off.  He gave him cortisone and also advised to rub a lotion after his shower on the itchy area.  He read that a new breakout can be a sign of leukemia--was coming and going.    He saw Dr. [name] at urology Monday also.  He checked his urine flow.  He's had difficulty with that since 1996.  It comes and goes.  Always able to pass his urine.  They discussed a potential procedure.  They will discuss it further in 6 mos if it's necessary.    Has no memory concerns.  He feels blessed.    He attends the [place] [facility].  He was in the Nurse, mental health.  Suspects his anxiety may have some relation.  Has some military disability related to his hearing.  He attended an 8 week group therapy with 7 other vets.  It was incredibly rewarding.  Takes one 5mg  valium at hs.    Has a disc bulge and pressure on a nerve root of his lumbar spine.  He also has hip pains at night.  Ok if he puts a pillow between the legs.    CAD of bypass graft with angina--had the bypass in 3/12 (triple bypass)  Had MAZE procedure for atrial fibrillation.  Remains on baby asa and statin.  Had a 28 day stay.  Had DVTs.    BP at upper limits of normal.  Hyperlipidemia:  On crestor therapy.    GERD with h pylori gastritis:  On prilosec.  Reports sinus drip affects his appetite.  Uses flonase for his sinuses and allergies 3-4 times per week at night.    Has had a dry cough for 9-10 years.  Was getting nodule tracked in his right upper lung.  Dr. Ashok Cordia at [facility] keeps an eye on him.  No change over 5 CTs.  Also had small cysts in liver and pancreas.  Has diastesis recti from his gallbladder surgery.  Had double hernia surgeries in the lower quadrants.    Past Medical History:  Diagnosis Date  . Allergic rhinitis due to pollen   . Anginal pain ([place])    once in a while, none recent  . Anxiety   . Anxiety disorder   .  Asthma    exercise induced  . Atrial fibrillation (HCC)    hx of  . Basal cell carcinoma of skin of lip   . Clotting disorder ([place])   . Coronary atherosclerosis of native coronary artery    with CABG 3/12  -MAZE procedure   . Diastasis recti 05/07/2016  . Difficulty urinating   . Diverticulosis of colon (without mention of hemorrhage)    mild  . Dysuria   . Edema   . Essential hypertension, benign   . Heart attack ([place]) 2012  . Helicobacter pylori (H. pylori)   . 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 History of anal fissures   . History of cholelithiasis   . Hyperlipidemia   . Hypertrophy of prostate with urinary obstruction and other lower urinary tract symptoms (LUTS)   . Impotence of organic origin   . Internal hemorrhoids without mention of complication   . Jaundice age 76  . Long term (current) use of anticoagulants   . Neutropenia ([place])  10/07/2016  . Nonspecific elevation of levels of transaminase or lactic acid dehydrogenase (LDH)   . Other voice and resonance disorders    after surgery, took breathing tube out and damaged something  . Pneumonia   . Pulmonary embolism ([place]) 2012   s/p heart surgery  . Pulmonary embolism ([place])    3/12  . Rheumatic fever age 25  . Skin cancer    Hx of squamous cell x2  . Sleep apnea    no CPAP  . Unspecified constipation     Past Surgical History:  Procedure Laterality Date  . CARDIAC CATHETERIZATION    . CHOLECYSTECTOMY N/A 11/18/2013   Procedure: LAPAROSCOPIC CHOLECYSTECTOMY With IOC;  Surgeon: Odis Hollingshead, MD;  Location: [facility]S;  Service: General;  Laterality: N/A;  . CORONARY ARTERY BYPASS GRAFT  2012   Median sternotomy, extracorporeal circulation, coronary  . HERNIA REPAIR  2010   bilateral  . HYDROCELE EXCISION     dr [name]   . MOHS SURGERY     squamus x2; BCC fo bridge of nose June 2017.  Marland Kitchen SCROTUM EXPLORATION  1990's   multiple  . SPERMATOCELECTOMY  02/1999   Right, Dr.[name]  . SUPERFICIAL SKIN CYSTECTOMY (l) HIP Right 1997   dr [name]  . VASECTOMY  04/1992    Allergies  Allergen Reactions  . Heparin     Blood clots   . Penicillin G Shortness Of Breath  . Levaquin [Levofloxacin In D5w] Other (See Comments)    Alexander [name] felt jittery and nervous with insomnia on the medication.  . Aspirin     Breathing complications, can tolerate baby asa  . Codeine     Some is ok  . Oxycodone     Can not take percocet, palpitations  . Sucralfate     Could not sleep  . Zetia [Ezetimibe]     jittery  . Zocor [Simvastatin]     Liver enzymes increased  . Plavix [Clopidogrel Bisulfate] Palpitations    Outpatient Encounter Prescriptions as of 09/10/2017  Medication Sig  . albuterol (PROVENTIL HFA;VENTOLIN HFA) 108 (90 BASE) MCG/ACT inhaler Inhale 2 puffs into the lungs every 6 (six) hours as needed for wheezing.  Marland Kitchen ALPRAZolam (XANAX) 0.5 MG tablet TAKE 1-2 TABLET BY  MOUTH EVERY DAILY TO HELP WITH ANXIETY  . aspirin 81 MG tablet Take 81 mg by mouth 3 (three) times a week. Mondays, wednesdays and fridays  . cholecalciferol (VITAMIN D) 1000 UNITS  tablet Take 1,000 Units by mouth daily.  . diazepam (VALIUM) 5 MG tablet Take one or two tablets by mouth to help rest  . omeprazole (PRILOSEC) 20 MG capsule Take 1 capsule (20 mg total) by mouth daily as needed (heartburn).  . rosuvastatin (CRESTOR) 10 MG tablet Take 1 tablet (10 mg total) by mouth daily.   No facility-administered encounter medications on file as of 09/10/2017.     Review of Systems:  Review of Systems  Constitutional: Negative for chills, fever and malaise/fatigue.  HENT: Positive for congestion and hearing loss.        Postnasal drip  Eyes: Negative for blurred vision.       Glasses  Respiratory: Positive for cough. Negative for shortness of breath.   Cardiovascular: Negative for chest pain, palpitations and leg swelling.  Gastrointestinal: Negative for abdominal pain.  Genitourinary: Negative for dysuria.  Musculoskeletal: Positive for back pain and joint pain. Negative for falls.  Skin: Positive for itching and rash.       Around neck  Neurological: Negative for dizziness and weakness.  Psychiatric/Behavioral: The Alexander [name] is nervous/anxious.     Health Maintenance  Topic Date Due  . PNA vac Low Risk Adult (2 of 2 - PPSV23) 10/03/2015  . INFLUENZA VACCINE  07/08/2017  . TETANUS/TDAP  12/14/2017    Physical Exam: Vitals:   09/10/17 1509  BP: 140/80  Pulse: 66  Temp: 98 F (36.7 C)  TempSrc: Oral  SpO2: 96%  Weight: 191 lb (86.6 kg)   Body mass index is 28.21 kg/m. Physical Exam  Constitutional: He is oriented to person, place, and time. He appears well-developed and well-nourished. No distress.  HENT:  Hearing aids  Eyes:  Glasses; dilated pupils  Cardiovascular: Normal rate, regular rhythm, normal heart sounds and intact distal pulses.   Pulmonary/Chest: Effort  normal and breath sounds normal. No respiratory distress.  Abdominal: Bowel sounds are normal.  Musculoskeletal: Normal range of motion.  Neurological: He is alert and oriented to person, place, and time.  Skin: Skin is warm and dry.  Psychiatric:  Anxious, pressured speech    Labs reviewed: Basic Metabolic Panel:  Recent Labs  10/07/16 0950  NA 140  K 4.2  CL 103  CO2 26  GLUCOSE 96  BUN 17  CREATININE 1.13  CALCIUM 9.0   Liver Function Tests:  Recent Labs  10/07/16 0950  AST 24  ALT 17  ALKPHOS 43  BILITOT 1.3*  PROT 6.7  ALBUMIN 3.9   No results for input(s): LIPASE, AMYLASE in the last 8760 hours. No results for input(s): AMMONIA in the last 8760 hours. CBC:  Recent Labs  10/07/16 1546  WBC 5.7  NEUTROABS 3,363  HGB 16.4  HCT 48.7  MCV 94.2  PLT 251   Lipid Panel:  Recent Labs  10/07/16 0950  CHOL 165  HDL 55  LDLCALC 93  TRIG 85  CHOLHDL 3.0   Lab Results  Component Value Date   HGBA1C 5.8 (H) 10/07/2016   Assessment/Plan 1. Coronary artery disease involving coronary bypass graft w ithout angina pectoris, unspecified whether native or transplanted heart -stable, asymptomatic, cont same meds (only on crestor), monitor - CBC with Differential/Platelet; Future - COMPLETE METABOLIC PANEL WITH GFR; Future - Lipid panel; Future  2. Angina pectoris ([place]) -no recent pain - CBC with Differential/Platelet; Future - COMPLETE METABOLIC PANEL WITH GFR; Future - Lipid panel; Future  3. Essential hypertension -bp at goal with current therapy, cont and monitor - COMPLETE METABOLIC  PANEL WITH GFR; Future  4. Helicobacter pylori gastritis -treated and resolved, has GERD, cont prilosec  5. Diastasis recti -ongoing, monitor for progression to hernia  6. Dermatitis -around neck area, pt concerned it's a sign of leukemia, but appears to be due to excessive heat, cont moisturizer and directions from dermatology  Labs/tests ordered:  Revised  labs to be done before Dec appt for CPE Orders Placed This Encounter  Procedures  . CBC with Differential/Platelet    Standing Status:   Future    Standing Expiration Date:   05/11/2018  . COMPLETE METABOLIC PANEL WITH GFR    Standing Status:   Future    Standing Expiration Date:   05/11/2018  . Lipid panel    Standing Status:   Future    Standing Expiration Date:   05/11/2018   Next appt: 11/25/2017 CPE, labs before  Treylin Burtch L. Marcquis Ridlon, D.O. [facility] [address],  [address] Cell Phone (Mon-Fri 8am-5pm):  [phone] On Call:  [phone] & follow prompts after 5pm & weekends Office Phone:  [phone] Office Fax:  [phone]

## 2017-09-15 ENCOUNTER — Other Ambulatory Visit: Payer: Self-pay | Admitting: *Deleted

## 2017-09-15 DIAGNOSIS — J45909 Unspecified asthma, uncomplicated: Secondary | ICD-10-CM

## 2017-09-16 ENCOUNTER — Ambulatory Visit (INDEPENDENT_AMBULATORY_CARE_PROVIDER_SITE_OTHER): Payer: Medicare Other | Admitting: Pulmonary Disease

## 2017-09-16 VITALS — BP 132/64 | HR 59 | Ht 69.5 in | Wt 192.0 lb

## 2017-09-16 DIAGNOSIS — J309 Allergic rhinitis, unspecified: Secondary | ICD-10-CM

## 2017-09-16 DIAGNOSIS — J4520 Mild intermittent asthma, uncomplicated: Secondary | ICD-10-CM | POA: Diagnosis not present

## 2017-09-16 DIAGNOSIS — J45909 Unspecified asthma, uncomplicated: Secondary | ICD-10-CM | POA: Diagnosis not present

## 2017-09-16 LAB — PULMONARY FUNCTION TEST
DL/VA % PRED: 83 %
DL/VA: 3.82 ml/min/mmHg/L
DLCO UNC % PRED: 85 %
DLCO UNC: 26.92 ml/min/mmHg
DLCO cor % pred: 80 %
DLCO cor: 25.41 ml/min/mmHg
FEF 25-75 PRE: 3.48 L/s
FEF 25-75 Post: 3.77 L/sec
FEF2575-%Change-Post: 8 %
FEF2575-%Pred-Post: 191 %
FEF2575-%Pred-Pre: 176 %
FEV1-%Change-Post: 3 %
FEV1-%PRED-POST: 128 %
FEV1-%PRED-PRE: 124 %
FEV1-PRE: 3.54 L
FEV1-Post: 3.65 L
FEV1FVC-%Change-Post: 6 %
FEV1FVC-%Pred-Pre: 112 %
FEV6-%CHANGE-POST: -3 %
FEV6-%PRED-POST: 114 %
FEV6-%PRED-PRE: 117 %
FEV6-POST: 4.24 L
FEV6-Pre: 4.39 L
FEV6FVC-%CHANGE-POST: 0 %
FEV6FVC-%Pred-Post: 107 %
FEV6FVC-%Pred-Pre: 107 %
FVC-%CHANGE-POST: -3 %
FVC-%Pred-Post: 106 %
FVC-%Pred-Pre: 110 %
FVC-Post: 4.24 L
FVC-Pre: 4.39 L
POST FEV6/FVC RATIO: 100 %
PRE FEV6/FVC RATIO: 100 %
Post FEV1/FVC ratio: 86 %
Pre FEV1/FVC ratio: 81 %
RV % PRED: 107 %
RV: 2.81 L
TLC % pred: 110 %
TLC: 7.7 L

## 2017-09-16 NOTE — Progress Notes (Signed)
Subjective:    Patient ID: Alexander [name], male    DOB: 02/03/1936, 81 y.o.   MRN: [id number]  C.C.:  Follow-up for Extrinsic Asthma, Chronic Allergic Rhinitis, & Recurrent Respiratory Infections.  HPI He reports he did have an upper respiratory illness a couple of weeks ago but feels he is improving.  Extrinsic asthma: Prescribed a Xopenex inhaler to use as needed. He reports he still has a mild, intermittently productive cough. He reports a clear mucus. He reports he does seem to have more problems breathing in the Summer with the increased heat & humidity. He does feel his rescue inhaler helps. He uses it 2-3 times a week. No exacerbations since his last appointment.   Chronic allergic rhinitis: Previously followed by ENT. He is still having some sinus congestion & drainage. He is using Flonase daily.   Review of Systems He reports he does still have intermittent heaviness in his chest. No other new chest pain or pressure. No fever or chills. No rashes or bruising.   Allergies  Allergen Reactions  . Heparin     Blood clots   . Penicillin G Shortness Of Breath  . Levaquin [Levofloxacin In D5w] Other (See Comments)    Patient felt jittery and nervous with insomnia on the medication.  . Aspirin     Breathing complications, can tolerate baby asa  . Codeine     Some is ok  . Oxycodone     Can not take percocet, palpitations  . Sucralfate     Could not sleep  . Zetia [Ezetimibe]     jittery  . Zocor [Simvastatin]     Liver enzymes increased  . Plavix [Clopidogrel Bisulfate] Palpitations    Current Outpatient Prescriptions on File Prior to Visit  Medication Sig Dispense Refill  . albuterol (PROVENTIL HFA;VENTOLIN HFA) 108 (90 BASE) MCG/ACT inhaler Inhale 2 puffs into the lungs every 6 (six) hours as needed for wheezing. 1 Inhaler 0  . ALPRAZolam (XANAX) 0.5 MG tablet TAKE 1-2 TABLET BY MOUTH EVERY DAILY TO HELP WITH ANXIETY 60 tablet 5  . aspirin 81 MG tablet Take 81 mg by  mouth 3 (three) times a week. Mondays, wednesdays and fridays    . cholecalciferol (VITAMIN D) 1000 UNITS tablet Take 1,000 Units by mouth daily.    . diazepam (VALIUM) 5 MG tablet Take one or two tablets by mouth to help rest 60 tablet 5  . omeprazole (PRILOSEC) 20 MG capsule Take 1 capsule (20 mg total) by mouth daily as needed (heartburn). 30 capsule 6  . rosuvastatin (CRESTOR) 10 MG tablet Take 1 tablet (10 mg total) by mouth daily. 30 tablet 11   No current facility-administered medications on file prior to visit.     Past Medical History:  Diagnosis Date  . Allergic rhinitis due to pollen   . Anginal pain ([place])    once in a while, none recent  . Anxiety   . Anxiety disorder   . Asthma    exercise induced  . Atrial fibrillation ([facility])    hx of  . Basal cell carcinoma of skin of lip   . Clotting disorder ([place])   . Coronary atherosclerosis of native coronary artery    with CABG 3/12  -MAZE procedure   . Diastasis recti 05/07/2016  . Difficulty urinating   . Diverticulosis of colon (without mention of hemorrhage)    mild  . Dysuria   . Edema   . Essential hypertension, benign   .  Heart attack ([place]) 2012  . Helicobacter pylori (H. pylori)   . 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 History of anal fissures   . History of cholelithiasis   . Hyperlipidemia   . Hypertrophy of prostate with urinary obstruction and other lower urinary tract symptoms (LUTS)   . Impotence of organic origin   . Internal hemorrhoids without mention of complication   . Jaundice age 58  . Long term (current) use of anticoagulants   . Neutropenia ([place]) 10/07/2016  . Nonspecific elevation of levels of transaminase or lactic acid dehydrogenase (LDH)   . Other voice and resonance disorders    after surgery, took breathing tube out and damaged something  . Pneumonia   . Pulmonary embolism ([place]) 2012   s/p heart surgery  . Pulmonary embolism ([place])    3/12  . Rheumatic fever age 59  . Skin  cancer    Hx of squamous cell x2  . Sleep apnea    no CPAP  . Unspecified constipation     Past Surgical History:  Procedure Laterality Date  . CARDIAC CATHETERIZATION    . CHOLECYSTECTOMY N/A 11/18/2013   Procedure: LAPAROSCOPIC CHOLECYSTECTOMY With IOC;  Surgeon: Odis Hollingshead, MD;  Location: [facility];  Service: General;  Laterality: N/A;  . CORONARY ARTERY BYPASS GRAFT  2012   Median sternotomy, extracorporeal circulation, coronary  . HERNIA REPAIR  2010   bilateral  . HYDROCELE EXCISION     dr [name]   . MOHS SURGERY     squamus x2; BCC fo bridge of nose June 2017.  Marland Kitchen SCROTUM EXPLORATION  1990's   multiple  . SPERMATOCELECTOMY  02/1999   Right, Dr.[name]  . SUPERFICIAL SKIN CYSTECTOMY (l) HIP Right 1997   dr [name]  . VASECTOMY  04/1992    Family History  Problem Relation Age of Onset  . Bone cancer Mother   . Diabetes Mother   . Colon cancer Neg Hx   . Esophageal cancer Neg Hx   . Rectal cancer Neg Hx   . Stomach cancer Neg Hx   . Lung disease Neg Hx     Social History   Social History  . Marital status: Married    Spouse name: N/A  . Number of children: 1  . Years of education: N/A   Occupational History  . Car Salesman-retired [employer]   Social History Main Topics  . Smoking status: Former Smoker    Packs/day: 1.00    Years: 22.00    Types: Cigarettes    Start date: 08/22/1956    Quit date: 12/08/1977  . Smokeless tobacco: Never Used     Comment: significant second-hand smoke exposure at work  . Alcohol use 0.0 oz/week     Comment: former heavy alcohol use, rare use  . Drug use: No  . Sexual activity: Yes    Partners: Female   Other Topics Concern  . Not on file   Social History Narrative   Daily caffeine       [name]   [name]   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 No pets currently. No hot tub  or bird exposure.    Married wife Alex       Objective:   Physical Exam BP 132/64 (BP Location: Left Arm, Cuff Size: Normal)   Pulse (!) 59   Ht 5' 9.5" (1.765 m)   Wt 192 lb (87.1 kg)   SpO2 97%   BMI 27.95 kg/m   General:  Awake. Alert. Mild central obesity.  Integument:  Warm. Dry. No rash. Extremities:  No cyanosis or clubbing.  HEENT:  No scleral icterus. Moist mucous membranes. Minimal nasal turbinate swelling Cardiovascular:  Regular rate. No edema. Regular rhythm.  Pulmonary:  Clear bilaterally to auscultation. No accessory muscle use on room air. Abdomen: Soft. Normal bowel sounds. Mildly protuberant. Musculoskeletal:  Normal bulk and tone. No joint deformity or effusion appreciated. Neurological:  Cranial nerves 2-12 grossly in tact. No meningismus. Moving all 4 extremities equally.   PFT 09/16/17: FVC 4.39 L (110%) FEV1 3.54 L (124%) FEV1/FVC 0.81 FEF 25-75 3.4 L (176%) negative bronchodilator response TLC 7.70 L (110%) RV 107% ERV 45% DLCO corrected 80% 04/30/16: FVC 4.49 L (111%) FEV1 3.16 L (124%) FEV1/FVC 0.81 FEF 25-75 3.57 L (174%) negative bronchodilator response TLC 8.35 L (120%) RV 122% ERV 9% DLCO corrected 79% 10/12/14: FVC 4.42 L (105%) FEV1 3.55 L (111%) FEV1/FVC 0.76 FEF 25-75 3.60 L (128%)                                        TLC 6.77 L (99%) RV 78% ERV 22% DLCO uncorrected 122%  IMAGING CXR PA/LAT 03/05/16 (previously reviewed by me): No parenchymal opacity or nodule appreciated. No pleural effusion or thickening. Mild hyperinflation with flattening of the diaphragms and deep sulci bilaterally.  SINUS X-RAY 2 VIEW 03/05/16 (per radiologist): No acute or chronic paranasal sinus  inflammation.  LABS 03/14/16 DG: 1016 IgA: 327 IgM: 87  03/05/16 CBC: 6.7/15.9/43.9/307    Assessment & Plan:  81 y.o. male with extrinsic asthma. Patient has had no flares of his asthma or respiratory infections requiring antibiotic or prednisone treatment since his last appointment. I reviewed his pulmonary function testing with him today which shows continued stability and improvement in his FEV1 despite his recent illness. He still has no significant bronchodilator response. His lung volumes and carbon monoxide diffusion capacity are both within normal ranges as well. As such, I do not feel any additional inhaler for asthma controller medications is necessary. Overall his chronic allergic rhinitis is well controlled with intranasal corticosteroid therapy. However, I did educate the patient on proper technique utilizing this medicine. I instructed the patient to contact our office if he had new breathing problems or questions before his next appointment.  1. Mild, intermittent extrinsic asthma:Well controlled utilizing only intermittent Xopenex. No new medications. 2. Chronic allergic rhinitis: Well controlled with Flonase. No changes. Patient instructed on proper technique with intranasal steroid medications. 3. Health maintenance: Status post Prevnar vaccine October 2015. Patient planning on influenza and Pneumovax vaccines upcoming. 4. Follow-up: Return to clinic in 1 year or sooner if needed.  Sonia Baller Ashok Cordia, M.D. [facility] Pager:  [phone] After 3pm or if no response, call [phone] 5:02 PM 09/16/17

## 2017-09-16 NOTE — Progress Notes (Signed)
PFT done today. 

## 2017-09-16 NOTE — Patient Instructions (Signed)
   Keep taking your medications as prescribed.  Please call us if you have any new breathing problems or questions before your next appointment.

## 2017-11-19 ENCOUNTER — Other Ambulatory Visit: Payer: Medicare Other

## 2017-11-20 ENCOUNTER — Other Ambulatory Visit: Payer: Medicare Other

## 2017-11-20 ENCOUNTER — Ambulatory Visit (INDEPENDENT_AMBULATORY_CARE_PROVIDER_SITE_OTHER): Payer: Medicare Other

## 2017-11-20 VITALS — BP 146/70 | HR 67 | Temp 97.7°F | Ht 69.0 in | Wt 189.0 lb

## 2017-11-20 DIAGNOSIS — I25810 Atherosclerosis of coronary artery bypass graft(s) without angina pectoris: Secondary | ICD-10-CM | POA: Diagnosis not present

## 2017-11-20 DIAGNOSIS — N400 Enlarged prostate without lower urinary tract symptoms: Secondary | ICD-10-CM | POA: Diagnosis not present

## 2017-11-20 DIAGNOSIS — I10 Essential (primary) hypertension: Secondary | ICD-10-CM | POA: Diagnosis not present

## 2017-11-20 DIAGNOSIS — Z0000 Encounter for general adult medical examination without abnormal findings: Secondary | ICD-10-CM

## 2017-11-20 DIAGNOSIS — I209 Angina pectoris, unspecified: Secondary | ICD-10-CM

## 2017-11-20 LAB — CBC WITH DIFFERENTIAL/PLATELET
Basophils Absolute: 29 cells/uL (ref 0–200)
Basophils Relative: 0.5 %
Eosinophils Absolute: 58 cells/uL (ref 15–500)
Eosinophils Relative: 1 %
HCT: 46.2 % (ref 38.5–50.0)
Hemoglobin: 16.1 g/dL (ref 13.2–17.1)
Lymphs Abs: 1757 cells/uL (ref 850–3900)
MCH: 31.9 pg (ref 27.0–33.0)
MCHC: 34.8 g/dL (ref 32.0–36.0)
MCV: 91.7 fL (ref 80.0–100.0)
MPV: 8.8 fL (ref 7.5–12.5)
Monocytes Relative: 8.1 %
Neutro Abs: 3486 cells/uL (ref 1500–7800)
Neutrophils Relative %: 60.1 %
Platelets: 238 10*3/uL (ref 140–400)
RBC: 5.04 10*6/uL (ref 4.20–5.80)
RDW: 12.2 % (ref 11.0–15.0)
Total Lymphocyte: 30.3 %
WBC mixed population: 470 cells/uL (ref 200–950)
WBC: 5.8 10*3/uL (ref 3.8–10.8)

## 2017-11-20 LAB — COMPLETE METABOLIC PANEL WITH GFR
AG Ratio: 1.7 (calc) (ref 1.0–2.5)
ALT: 21 U/L (ref 9–46)
AST: 26 U/L (ref 10–35)
Albumin: 4.2 g/dL (ref 3.6–5.1)
Alkaline phosphatase (APISO): 39 U/L — ABNORMAL LOW (ref 40–115)
BUN: 17 mg/dL (ref 7–25)
CO2: 30 mmol/L (ref 20–32)
Calcium: 9 mg/dL (ref 8.6–10.3)
Chloride: 102 mmol/L (ref 98–110)
Creat: 1.1 mg/dL (ref 0.70–1.18)
GFR, Est African American: 74 mL/min/{1.73_m2} (ref >=60)
GFR, Est Non African American: 64 mL/min/{1.73_m2} (ref >=60)
Globulin: 2.5 g/dL (calc) (ref 1.9–3.7)
Glucose, Bld: 105 mg/dL — ABNORMAL HIGH (ref 65–99)
Potassium: 4.5 mmol/L (ref 3.5–5.3)
Sodium: 139 mmol/L (ref 135–146)
Total Bilirubin: 1.3 mg/dL — ABNORMAL HIGH (ref 0.2–1.2)
Total Protein: 6.7 g/dL (ref 6.1–8.1)

## 2017-11-20 LAB — LIPID PANEL
Cholesterol: 159 mg/dL (ref <200)
HDL: 63 mg/dL (ref >40)
LDL Cholesterol (Calc): 82 mg/dL (calc)
Non-HDL Cholesterol (Calc): 96 mg/dL (calc) (ref <130)
Total CHOL/HDL Ratio: 2.5 (calc) (ref <5.0)
Triglycerides: 64 mg/dL (ref <150)

## 2017-11-20 NOTE — Patient Instructions (Signed)
Alexander [name] , Thank you for taking time to come for your Medicare Wellness Visit. I appreciate your ongoing commitment to your health goals. Please review the following plan we discussed and let me know if I can assist you in the future.   Screening recommendations/referrals: Colonoscopy up to date, pt is over age 79 Recommended yearly ophthalmology/optometry visit for glaucoma screening and checkup Recommended yearly dental visit for hygiene and checkup  Vaccinations: Influenza vaccine due, planning to get it after christmas Pneumococcal vaccine due, planning to get it after christmas Tdap vaccine up to date. Due 12/14/2017 Shingles vaccine due, let us know if you decide to get this    Advanced directives: Please bring [place] a copy of your living will and health care power of attorney  Conditions/risks identified: None  Next appointment: Alexander [name] 12/24/2017 @ 1:30pm                       Tyson Dense, RN 11/23/2018 @ 12:45  Preventive Care 36 Years and Older, Male Preventive care refers to lifestyle choices and visits with your health care provider that can promote health and wellness. What does preventive care include?  A yearly physical exam. This is also called an annual well check.  Dental exams once or twice a year.  Routine eye exams. Ask your health care provider how often you should have your eyes checked.  Personal lifestyle choices, including:  Daily care of your teeth and gums.  Regular physical activity.  Eating a healthy diet.  Avoiding tobacco and drug use.  Limiting alcohol use.  Practicing safe sex.  Taking low doses of aspirin every day.  Taking vitamin and mineral supplements as recommended by your health care provider. What happens during an annual well check? The services and screenings done by your health care provider during your annual well check will depend on your age, overall health, lifestyle risk factors, and family history of  disease. Counseling  Your health care provider may ask you questions about your:  Alcohol use.  Tobacco use.  Drug use.  Emotional well-being.  Home and relationship well-being.  Sexual activity.  Eating habits.  History of falls.  Memory and ability to understand (cognition).  Work and work Statistician. Screening  You may have the following tests or measurements:  Height, weight, and BMI.  Blood pressure.  Lipid and cholesterol levels. These may be checked every 5 years, or more frequently if you are over 46 years old.  Skin check.  Lung cancer screening. You may have this screening every year starting at age 66 if you have a 30-pack-year history of smoking and currently smoke or have quit within the past 15 years.  Fecal occult blood test (FOBT) of the stool. You may have this test every year starting at age 69.  Flexible sigmoidoscopy or colonoscopy. You may have a sigmoidoscopy every 5 years or a colonoscopy every 10 years starting at age 74.  Prostate cancer screening. Recommendations will vary depending on your family history and other risks.  Hepatitis C blood test.  Hepatitis B blood test.  Sexually transmitted disease (STD) testing.  Diabetes screening. This is done by checking your blood sugar (glucose) after you have not eaten for a while (fasting). You may have this done every 1-3 years.  Abdominal aortic aneurysm (AAA) screening. You may need this if you are a current or former smoker.  Osteoporosis. You may be screened starting at age 35 if you are at  high risk. Talk with your health care provider about your test results, treatment options, and if necessary, the need for more tests. Vaccines  Your health care provider may recommend certain vaccines, such as:  Influenza vaccine. This is recommended every year.  Tetanus, diphtheria, and acellular pertussis (Tdap, Td) vaccine. You may need a Td booster every 10 years.  Zoster vaccine. You may  need this after age 19.  Pneumococcal 13-valent conjugate (PCV13) vaccine. One dose is recommended after age 52.  Pneumococcal polysaccharide (PPSV23) vaccine. One dose is recommended after age 64. Talk to your health care provider about which screenings and vaccines you need and how often you need them. This information is not intended to replace advice given to you by your health care provider. Make sure you discuss any questions you have with your health care provider. Document Released: 12/21/2015 Document Revised: 08/13/2016 Document Reviewed: 09/25/2015 Elsevier Interactive Patient Education  2017 [place] Prevention in the Home Falls can cause injuries. They can happen to people of all ages. There are many things you can do to make your home safe and to help prevent falls. What can I do on the outside of my home?  Regularly fix the edges of walkways and driveways and fix any cracks.  Remove anything that might make you trip as you walk through a door, such as a raised step or threshold.  Trim any bushes or trees on the path to your home.  Use bright outdoor lighting.  Clear any walking paths of anything that might make someone trip, such as rocks or tools.  Regularly check to see if handrails are loose or broken. Make sure that both sides of any steps have handrails.  Any raised decks and porches should have guardrails on the edges.  Have any leaves, snow, or ice cleared regularly.  Use sand or salt on walking paths during winter.  Clean up any spills in your garage right away. This includes oil or grease spills. What can I do in the bathroom?  Use night lights.  Install grab bars by the toilet and in the tub and shower. Do not use towel bars as grab bars.  Use non-skid mats or decals in the tub or shower.  If you need to sit down in the shower, use a plastic, non-slip stool.  Keep the floor dry. Clean up any water that spills on the floor as soon as it  happens.  Remove soap buildup in the tub or shower regularly.  Attach bath mats securely with double-sided non-slip rug tape.  Do not have throw rugs and other things on the floor that can make you trip. What can I do in the bedroom?  Use night lights.  Make sure that you have a light by your bed that is easy to reach.  Do not use any sheets or blankets that are too big for your bed. They should not hang down onto the floor.  Have a firm chair that has side arms. You can use this for support while you get dressed.  Do not have throw rugs and other things on the floor that can make you trip. What can I do in the kitchen?  Clean up any spills right away.  Avoid walking on wet floors.  Keep items that you use a lot in easy-to-reach places.  If you need to reach something above you, use a strong step stool that has a grab bar.  Keep electrical cords out of the  way.  Do not use floor polish or wax that makes floors slippery. If you must use wax, use non-skid floor wax.  Do not have throw rugs and other things on the floor that can make you trip. What can I do with my stairs?  Do not leave any items on the stairs.  Make sure that there are handrails on both sides of the stairs and use them. Fix handrails that are broken or loose. Make sure that handrails are as long as the stairways.  Check any carpeting to make sure that it is firmly attached to the stairs. Fix any carpet that is loose or worn.  Avoid having throw rugs at the top or bottom of the stairs. If you do have throw rugs, attach them to the floor with carpet tape.  Make sure that you have a light switch at the top of the stairs and the bottom of the stairs. If you do not have them, ask someone to add them for you. What else can I do to help prevent falls?  Wear shoes that:  Do not have high heels.  Have rubber bottoms.  Are comfortable and fit you well.  Are closed at the toe. Do not wear sandals.  If you  use a stepladder:  Make sure that it is fully opened. Do not climb a closed stepladder.  Make sure that both sides of the stepladder are locked into place.  Ask someone to hold it for you, if possible.  Clearly mark and make sure that you can see:  Any grab bars or handrails.  First and last steps.  Where the edge of each step is.  Use tools that help you move around (mobility aids) if they are needed. These include:  Canes.  Walkers.  Scooters.  Crutches.  Turn on the lights when you go into a dark area. Replace any light bulbs as soon as they burn out.  Set up your furniture so you have a clear path. Avoid moving your furniture around.  If any of your floors are uneven, fix them.  If there are any pets around you, be aware of where they are.  Review your medicines with your doctor. Some medicines can make you feel dizzy. This can increase your chance of falling. Ask your doctor what other things that you can do to help prevent falls. This information is not intended to replace advice given to you by your health care provider. Make sure you discuss any questions you have with your health care provider. Document Released: 09/20/2009 Document Revised: 05/01/2016 Document Reviewed: 12/29/2014 Elsevier Interactive Patient Education  2017 [employer].

## 2017-11-20 NOTE — Addendum Note (Signed)
Addended by: Logan Bores on: 11/20/2017 12:54 PM   Modules accepted: Orders

## 2017-11-20 NOTE — Progress Notes (Signed)
Subjective:   Alexander [name] is a 81 y.o. male who presents for Medicare Annual/Subsequent preventive examination.  Last AWV-10/07/2016       Objective:    Vitals: BP (!) 162/70 (BP Location: Right Arm, Patient Position: Sitting)   Pulse 67   Temp 97.7 F (36.5 C) (Oral)   Ht 5\' 9"  (1.753 m)   Wt 189 lb (85.7 kg)   SpO2 98%   BMI 27.91 kg/m   Body mass index is 27.91 kg/m.  Advanced Directives 11/20/2017 05/25/2017 03/25/2017 10/07/2016 10/07/2016 09/09/2016 06/04/2016  Does Patient Have a Medical Advance Directive? Yes No No No No No No  Type of Advance Directive - - - - - - -  Does patient want to make changes to medical advance directive? Yes (ED - Information included in AVS) - - - - - -  Copy of Healthcare Power of Attorney in Chart? - - - - - - -  Would patient like information on creating a medical advance directive? - Yes (MAU/Ambulatory/Procedural Areas - Information given) - - Yes - Educational materials given - -  Pre-existing out of facility DNR order (yellow form or pink MOST form) - - - - - - -    Tobacco Social History   Tobacco Use  Smoking Status Former Smoker  . Packs/day: 1.00  . Years: 22.00  . Pack years: 22.00  . Types: Cigarettes  . Start date: 08/22/1956  . Last attempt to quit: 12/08/1977  . Years since quitting: 39.9  Smokeless Tobacco Never Used  Tobacco Comment   significant second-hand smoke exposure at work     Counseling given: Not Answered Comment: significant second-hand smoke exposure at work   Clinical Intake:  Pre-visit preparation completed: No  Pain : No/denies pain     Nutritional Risks: None Diabetes: No  How often do you need to have someone help you when you read instructions, pamphlets, or other written materials from your doctor or pharmacy?: 1 - Never What is the last grade level you completed in school?: [place]?: No  Information entered by :: Rich Reining, RN  Past Medical History:    Diagnosis Date  . Allergic rhinitis due to pollen   . Anginal pain ([place])    once in a while, none recent  . Anxiety   . Anxiety disorder   . Asthma    exercise induced  . Atrial fibrillation (HCC)    hx of  . Basal cell carcinoma of skin of lip   . Clotting disorder ([place])   . Coronary atherosclerosis of native coronary artery    with CABG 3/12  -MAZE procedure   . Diastasis recti 05/07/2016  . Difficulty urinating   . Diverticulosis of colon (without mention of hemorrhage)    mild  . Dysuria   . Edema   . Essential hypertension, benign   . Heart attack ([place]) 2012  . Helicobacter pylori (H. pylori)   . 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 History of anal fissures   . History of cholelithiasis   . Hyperlipidemia   . Hypertrophy of prostate with urinary obstruction and other lower urinary tract symptoms (LUTS)   . Impotence of organic origin   . Internal hemorrhoids without mention of complication   . Jaundice age 40  . Long term (current) use of anticoagulants   . Neutropenia ([place]) 10/07/2016  . Nonspecific elevation of levels of transaminase or lactic acid dehydrogenase (LDH)   .  Other voice and resonance disorders    after surgery, took breathing tube out and damaged something  . Pneumonia   . Pulmonary embolism ([place]) 2012   s/p heart surgery  . Pulmonary embolism ([place])    3/12  . Rheumatic fever age 66  . Skin cancer    Hx of squamous cell x2  . Sleep apnea    no CPAP  . Unspecified constipation    Past Surgical History:  Procedure Laterality Date  . CARDIAC CATHETERIZATION    . CHOLECYSTECTOMY N/A 11/18/2013   Procedure: LAPAROSCOPIC CHOLECYSTECTOMY With IOC;  Surgeon: Odis Hollingshead, MD;  Location: [facility];  Service: General;  Laterality: N/A;  . CORONARY ARTERY BYPASS GRAFT  2012   Median sternotomy, extracorporeal circulation, coronary  . HERNIA REPAIR  2010   bilateral  . HYDROCELE EXCISION     dr [name]   . MOHS SURGERY     squamus  x2; BCC fo bridge of nose June 2017.  Marland Kitchen SCROTUM EXPLORATION  1990's   multiple  . SPERMATOCELECTOMY  02/1999   Right, Dr.[name]  . SUPERFICIAL SKIN CYSTECTOMY (l) HIP Right 1997   dr [name]  . VASECTOMY  04/1992   Family History  Problem Relation Age of Onset  . Bone cancer Mother   . Diabetes Mother   . Colon cancer Neg Hx   . Esophageal cancer Neg Hx   . Rectal cancer Neg Hx   . Stomach cancer Neg Hx   . Lung disease Neg Hx    Social History   Socioeconomic History  . Marital status: Married    Spouse name: Not on file  . Number of children: 1  . Years of education: Not on file  . Highest education level: Not on file  Social Needs  . Financial resource strain: Not hard at all  . Food insecurity - worry: Never true  . Food insecurity - inability: Never true  . Transportation needs - medical: No  . Transportation needs - non-medical: No  Occupational History  . Occupation: Presenter, broadcasting: [employer]  Tobacco Use  . Smoking status: Former Smoker    Packs/day: 1.00    Years: 22.00    Pack years: 22.00    Types: Cigarettes    Start date: 08/22/1956    Last attempt to quit: 12/08/1977    Years since quitting: 39.9  . Smokeless tobacco: Never Used  . Tobacco comment: significant second-hand smoke exposure at work  Substance and Sexual Activity  . Alcohol use: Yes    Alcohol/week: 0.0 oz    Comment: former heavy alcohol use, rare use  . Drug use: No  . Sexual activity: Yes    Partners: Female  Other Topics Concern  . Not on file  Social History Narrative   Daily caffeine       [name]   [name]   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 No pets currently. No hot tub or bird exposure.    Married wife Alex    Outpatient Encounter Medications as of 11/20/2017  Medication Sig  . albuterol (PROVENTIL HFA;VENTOLIN HFA) 108 (90 BASE) MCG/ACT inhaler  Inhale 2 puffs into the lungs every 6 (six) hours as needed for wheezing.  Marland Kitchen ALPRAZolam (XANAX) 0.5 MG tablet TAKE 1-2 TABLET BY MOUTH EVERY DAILY TO HELP WITH ANXIETY  . aspirin 81 MG tablet Take 81 mg by mouth 3 (three) times a week. Mondays, wednesdays and fridays  . cholecalciferol (VITAMIN D) 1000 UNITS tablet Take 1,000 Units by mouth daily.  . diazepam (VALIUM) 5 MG tablet Take one or two tablets by mouth to help rest  . omeprazole (PRILOSEC) 20 MG capsule Take 1 capsule (20 mg total) by mouth daily as needed (heartburn).  . rosuvastatin (CRESTOR) 10 MG tablet Take 1 tablet (10 mg total) by mouth daily.   No facility-administered encounter medications on file as of 11/20/2017.     Activities of Daily Living In your present state of health, do you have any difficulty performing the following activities: 11/20/2017  Hearing? Y  Vision? N  Difficulty concentrating or making decisions? N  Walking or climbing stairs? N  Dressing or bathing? N  Doing errands, shopping? N  Preparing Food and eating ? N  Using the Toilet? N  In the past six months, have you accidently leaked urine? N  Do you have problems with loss of bowel control? N  Managing your Medications? N  Managing your Finances? N  Housekeeping or managing your Housekeeping? N  Some recent data might be hidden    Patient Care Team: [name], DO as PCP - General (Geriatric Medicine) Javier Glazier, MD as Consulting Physician (Pulmonary Disease) Danella Sensing, MD as Consulting Physician (Dermatology) Jessy Oto, MD as Consulting Physician (Orthopedic Surgery) Irine Seal, MD as Attending  Physician (Urology) Jackolyn Confer, MD as Consulting Physician (General Surgery) Melrose Nakayama, MD as Consulting Physician (Cardiothoracic Surgery) Rozetta Nunnery, MD as Consulting Physician (Otolaryngology) Griselda Miner, MD as Consulting Physician (Dermatology)   Assessment:   This is a routine wellness examination for Lamari.  Exercise Activities and Dietary recommendations Current Exercise Habits: Home exercise routine, Type of exercise: stretching, Time (Minutes): 30, Frequency (Times/Week): 3, Weekly Exercise (Minutes/Week): 90, Exercise limited by: None identified  Goals    . Patient Stated     Would like to stay physically fit, maintain relationships, and take care of himself and others.       Fall Risk Fall Risk  11/20/2017 09/10/2017 06/23/2017 05/25/2017 10/07/2016  Falls in the past year? No No No No Yes  Number falls in past yr: - - - - 1  Comment - - - - Stepped off of couch and did not realize the recliner was out and stepped into the open part and fell backwards into the coffee table.  Injury with Fall? - - - - No  Follow up - - - - Falls prevention discussed   Is the patient's home free of loose throw rugs in walkways, pet beds, electrical cords, etc?   yes      Grab bars in the bathroom? yes      Handrails on the stairs?   yes      Adequate lighting?   yes  Timed Get Up and Go Performed: 12 seconds, within normal limits  Depression Screen PHQ 2/9 Scores 11/20/2017 09/10/2017 06/23/2017 10/07/2016  PHQ - 2 Score 0 0 0 0    Cognitive Function MMSE - Mini Mental State Exam 10/07/2016 11/06/2015 10/18/2014  Not completed: - (No Data) -  Orientation to time 5 5 5   Orientation to Place 5 5  5  Registration 3 3 3   Attention/ Calculation 5 5 5   Recall 3 3 2   Language- name 2 objects 2 2 2   Language- repeat 1 1 1   Language- follow 3 step command 3 2 3   Language- read & follow direction 1 1 1   Write a sentence 1 1 1   Copy design 1 1 1   Total  score 30 29 29         Immunization History  Administered Date(s) Administered  . Influenza Split 10/13/2013  . Influenza,inj,Quad PF,6+ Mos 10/07/2016  . Influenza-Unspecified 10/02/2014, 09/08/2015  . Pneumococcal Conjugate-13 10/16/2010, 10/02/2014  . Td 12/15/2007    Qualifies for Shingles Vaccine? Yes, patient educated and will think abou tit  Screening Tests Health Maintenance  Topic Date Due  . PNA vac Low Risk Adult (2 of 2 - PPSV23) 10/03/2015  . INFLUENZA VACCINE  07/08/2017  . TETANUS/TDAP  12/14/2017   Cancer Screenings: Lung: Low Dose CT Chest recommended if Age 39-80 years, 30 pack-year currently smoking OR have quit w/in 15years. Patient does not qualify. Colorectal: up to date  Additional Screenings:  Hepatitis B/HIV/Syphillis:Not indicated Hepatitis C Screening: Not indicated    Plan:    I have personally reviewed and addressed the Medicare Annual Wellness questionnaire and have noted the following in the patient's chart:  A. Medical and social history B. Use of alcohol, tobacco or illicit drugs  C. Current medications and supplements D. Functional ability and status E.  Nutritional status F.  Physical activity G. Advance directives H. List of other physicians I.  Hospitalizations, surgeries, and ER visits in previous 12 months J.  Terminous to include hearing, vision, cognitive, depression L. Referrals and appointments - none  In addition, I have reviewed and discussed with patient certain preventive protocols, quality metrics, and best practice recommendations. A written personalized care plan for preventive services as well as general preventive health recommendations were provided to patient.  See attached scanned questionnaire for additional information.   Signed,   Rich Reining, RN Nurse Health Advisor   Quick Notes   Health Maintenance: Pt plans on getting flu and pna 23 after the holidays. Pt will think on getting Shingrix.       Abnormal Screen: MMSE 29/30. Passed clock drawing     Patient Concerns: Pt would like PSA checked     Nurse Concerns: None

## 2017-11-21 LAB — PSA: PSA: 1.7 ng/mL (ref <=4.0)

## 2017-11-23 DIAGNOSIS — Z85828 Personal history of other malignant neoplasm of skin: Secondary | ICD-10-CM | POA: Diagnosis not present

## 2017-11-23 DIAGNOSIS — L720 Epidermal cyst: Secondary | ICD-10-CM | POA: Diagnosis not present

## 2017-11-23 DIAGNOSIS — L821 Other seborrheic keratosis: Secondary | ICD-10-CM | POA: Diagnosis not present

## 2017-11-23 DIAGNOSIS — D225 Melanocytic nevi of trunk: Secondary | ICD-10-CM | POA: Diagnosis not present

## 2017-11-25 ENCOUNTER — Encounter: Payer: Medicare Other | Admitting: Internal Medicine

## 2017-11-26 ENCOUNTER — Encounter: Payer: Medicare Other | Admitting: Internal Medicine

## 2017-12-22 ENCOUNTER — Other Ambulatory Visit: Payer: Medicare Other

## 2017-12-24 ENCOUNTER — Ambulatory Visit (INDEPENDENT_AMBULATORY_CARE_PROVIDER_SITE_OTHER): Payer: Medicare Other | Admitting: Internal Medicine

## 2017-12-24 ENCOUNTER — Encounter: Payer: Self-pay | Admitting: Internal Medicine

## 2017-12-24 VITALS — BP 140/70 | HR 67 | Temp 98.1°F | Ht 69.0 in | Wt 191.0 lb

## 2017-12-24 DIAGNOSIS — M48061 Spinal stenosis, lumbar region without neurogenic claudication: Secondary | ICD-10-CM | POA: Diagnosis not present

## 2017-12-24 DIAGNOSIS — F422 Mixed obsessional thoughts and acts: Secondary | ICD-10-CM

## 2017-12-24 DIAGNOSIS — F411 Generalized anxiety disorder: Secondary | ICD-10-CM | POA: Diagnosis not present

## 2017-12-24 DIAGNOSIS — Z0000 Encounter for general adult medical examination without abnormal findings: Secondary | ICD-10-CM | POA: Diagnosis not present

## 2017-12-24 DIAGNOSIS — I25810 Atherosclerosis of coronary artery bypass graft(s) without angina pectoris: Secondary | ICD-10-CM | POA: Diagnosis not present

## 2017-12-24 DIAGNOSIS — K862 Cyst of pancreas: Secondary | ICD-10-CM | POA: Diagnosis not present

## 2017-12-24 DIAGNOSIS — I10 Essential (primary) hypertension: Secondary | ICD-10-CM

## 2017-12-24 NOTE — Progress Notes (Signed)
Provider:  [name]. Mariea Clonts, D.O., C.M.D. Location:   [facility]  Place of Service:   clinic  Previous PCP: [name], DO Patient Care Team: [name], DO as PCP - General (Geriatric Medicine) Javier Glazier, MD as Consulting Physician (Pulmonary Disease) Danella Sensing, MD as Consulting Physician (Dermatology) Jessy Oto, MD as Consulting Physician (Orthopedic Surgery) Irine Seal, MD as Attending Physician (Urology) Jackolyn Confer, MD as Consulting Physician (General Surgery) Melrose Nakayama, MD as Consulting Physician (Cardiothoracic Surgery) Rozetta Nunnery, MD as Consulting Physician (Otolaryngology) Griselda Miner, MD as Consulting Physician (Dermatology)  Extended Emergency Contact Information Primary Emergency Contact: Bomar,[name] Address: [place], [place] [address] [place] Phone: [phone] Work Phone: [phone] Relation: Spouse  Code Status: full code Goals of Care: Advanced Directive information Advanced Directives 11/20/2017  Does Patient Have a Medical Advance Directive? Yes  Type of Advance Directive -  Does patient want to make changes to medical advance directive? Yes (ED - Information included in AVS)  Copy of [place] in Chart? -  Would patient like information on creating a medical advance directive? -  Pre-existing out of facility DNR order (yellow form or pink MOST form) -   Chief Complaint  Patient presents with  . Annual Exam    CPE, discuss labs    HPI: Patient is a 82 y.o. male seen today for an annual physical exam and to review his labs.  Prostate biopsy 12 yrs ago was inconclusive and he likes to keep an eye on it.  PSA was 1.7, previously 1.8 4 yrs ago.  Had hematuria at the time.  Just one time.    He went to a geriatric psychiatrist at the [facility] for a review of his cognitive status, OCD and anxiety.  After the two visits with her,  he saw a psychologist also.  He now saw the psychologist just a few days ago.  He studies this with cognitive-behavioral therapy.  He recommended he stay on the valium and xanax.  He did require medication for PTSD when he first came out of the [employer].  He volunteered for a workshop of veterans.    Hyperlipidemia:  LDL was up to 82.  Had been having poor appetite, but then ate more food he enjoyed to gain/maintain weight and he's quit that now.  He has a visit with Dr. Tamala Julian coming.  He does not want to increase crestor.    His fasting glucose was elevated just slightly at 105.  His mother was diabetic.    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  He'd been taking a medication for his lower back for several years.      Asthma:  On albuterol prn.  He's not needed albuterol in about a month.  He is doing breathing exercises.    He does a series of stretches also.  He has to turn over a lot at night due to some lumbar spinal stenosis--hips hurt on each side.  Hasn't had an MRI in 13-14 years.  Not bothersome in the daytime.  He does not want to take pain medication for it.  It would be a last resort.  Reports he fell over a chain at night and hurt his back.    CT abd/pelvis had shown a cyst in his pancreas.  He also had hepatic cysts.  He is concerned about the  pancreatic lesion.  The last MRI was in June.  Also had a nodule that did not change over 2 years and 3 mos in the right upper lung.    Past Medical History:  Diagnosis Date  . Allergic rhinitis due to pollen   . Anginal pain ([place])    once in a while, none recent  . Anxiety   . Anxiety disorder   . Asthma    exercise induced  . Atrial fibrillation (HCC)    hx of  . Basal cell carcinoma of skin of lip   . Clotting disorder ([place])   . Coronary atherosclerosis of native coronary artery    with CABG 3/12  -MAZE procedure   . Diastasis recti 05/07/2016  . Difficulty urinating   . Diverticulosis of colon (without mention of  hemorrhage)    mild  . Dysuria   . Edema   . Essential hypertension, benign   . Heart attack ([place]) 2012  . Helicobacter pylori (H. pylori)   . 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 History of anal fissures   . History of cholelithiasis   . Hyperlipidemia   . Hypertrophy of prostate with urinary obstruction and other lower urinary tract symptoms (LUTS)   . Impotence of organic origin   . Internal hemorrhoids without mention of complication   . Jaundice age 64  . Long term (current) use of anticoagulants   . Neutropenia ([place]) 10/07/2016  . Nonspecific elevation of levels of transaminase or lactic acid dehydrogenase (LDH)   . Other voice and resonance disorders    after surgery, took breathing tube out and damaged something  . Pneumonia   . Pulmonary embolism ([place]) 2012   s/p heart surgery  . Pulmonary embolism ([place])    3/12  . Rheumatic fever age 62  . Skin cancer    Hx of squamous cell x2  . Sleep apnea    no CPAP  . Unspecified constipation    Past Surgical History:  Procedure Laterality Date  . CARDIAC CATHETERIZATION    . CHOLECYSTECTOMY N/A 11/18/2013   Procedure: LAPAROSCOPIC CHOLECYSTECTOMY With IOC;  Surgeon: Odis Hollingshead, MD;  Location: [facility];  Service: General;  Laterality: N/A;  . CORONARY ARTERY BYPASS GRAFT  2012   Median sternotomy, extracorporeal circulation, coronary  . HERNIA REPAIR  2010   bilateral  . HYDROCELE EXCISION     dr [name]   . MOHS SURGERY     squamus x2; BCC fo bridge of nose June 2017.  Marland Kitchen SCROTUM EXPLORATION  1990's   multiple  . SPERMATOCELECTOMY  02/1999   Right, Dr.[name]  . SUPERFICIAL SKIN CYSTECTOMY (l) HIP Right 1997   dr [name]  . VASECTOMY  04/1992    reports that he quit smoking about 40 years ago. His smoking use included cigarettes. He started smoking about 61 years ago. He has a 22.00 pack-year smoking history. he has never used smokeless tobacco. He reports that he drinks alcohol. He reports that he does  not use drugs.  Functional Status Survey:    Family History  Problem Relation Age of Onset  . Bone cancer Mother   . Diabetes Mother   . Colon cancer Neg Hx   . Esophageal cancer Neg Hx   . Rectal cancer Neg Hx   . Stomach cancer Neg Hx   . Lung disease Neg Hx     Health Maintenance  Topic Date Due  . PNA vac Low Risk Adult (2  of 2 - PPSV23) 10/03/2015  . INFLUENZA VACCINE  07/08/2017  . TETANUS/TDAP  12/14/2017    Allergies  Allergen Reactions  . Heparin     Blood clots   . Penicillin G Shortness Of Breath  . Levaquin [Levofloxacin In D5w] Other (See Comments)    Patient felt jittery and nervous with insomnia on the medication.  . Aspirin     Breathing complications, can tolerate baby asa  . Codeine     Some is ok  . Oxycodone     Can not take percocet, palpitations  . Sucralfate     Could not sleep  . Zetia [Ezetimibe]     jittery  . Zocor [Simvastatin]     Liver enzymes increased  . Plavix [Clopidogrel Bisulfate] Palpitations    Outpatient Encounter Medications as of 12/24/2017  Medication Sig  . albuterol (PROVENTIL HFA;VENTOLIN HFA) 108 (90 BASE) MCG/ACT inhaler Inhale 2 puffs into the lungs every 6 (six) hours as needed for wheezing.  Marland Kitchen ALPRAZolam (XANAX) 0.5 MG tablet TAKE 1-2 TABLET BY MOUTH EVERY DAILY TO HELP WITH ANXIETY  . aspirin 81 MG tablet Take 81 mg by mouth 3 (three) times a week. Mondays, wednesdays and fridays  . cholecalciferol (VITAMIN D) 1000 UNITS tablet Take 1,000 Units by mouth daily.  . diazepam (VALIUM) 5 MG tablet Take one or two tablets by mouth to help rest  . omeprazole (PRILOSEC) 20 MG capsule Take 1 capsule (20 mg total) by mouth daily as needed (heartburn).  . rosuvastatin (CRESTOR) 10 MG tablet Take 1 tablet (10 mg total) by mouth daily.   No facility-administered encounter medications on file as of 12/24/2017.     Review of Systems  Constitutional: Negative for chills, fever and malaise/fatigue.  HENT: Negative for  congestion and hearing loss.   Eyes: Negative for blurred vision.  Respiratory: Negative for cough and shortness of breath.   Cardiovascular: Negative for chest pain, palpitations and leg swelling.  Gastrointestinal: Negative for abdominal pain, blood in stool, constipation, diarrhea and melena.  Genitourinary: Negative for dysuria.  Musculoskeletal: Positive for back pain. Negative for falls.  Skin: Positive for itching and rash.  Neurological: Negative for dizziness, loss of consciousness and weakness.  Endo/Heme/Allergies: Does not bruise/bleed easily.  Psychiatric/Behavioral: Negative for depression and memory loss. The patient is nervous/anxious.     Vitals:   12/24/17 1331  BP: 140/70  Pulse: 67  Temp: 98.1 F (36.7 C)  TempSrc: Oral  SpO2: 96%  Weight: 191 lb (86.6 kg)  Height: 5\' 9"  (1.753 m)   Body mass index is 28.21 kg/m. Physical Exam  Constitutional: He is oriented to person, place, and time. He appears well-developed and well-nourished. No distress.  HENT:  Head: Normocephalic and atraumatic.  Right Ear: External ear normal.  Left Ear: External ear normal.  Nose: Nose normal.  Mouth/Throat: Oropharynx is clear and moist. No oropharyngeal exudate.  Upper dentures   Eyes: Conjunctivae and EOM are normal. Pupils are equal, round, and reactive to light. No scleral icterus.  Dilated pupils, right eye twitching  Neck: Normal range of motion. Neck supple. No JVD present. No tracheal deviation present. No thyromegaly present.  Cardiovascular: Normal rate, regular rhythm, normal heart sounds and intact distal pulses.  Pulmonary/Chest: Effort normal and breath sounds normal. No respiratory distress.  Abdominal: Soft. Bowel sounds are normal.  Musculoskeletal: Normal range of motion.  Lymphadenopathy:    He has no cervical adenopathy.  Neurological: He is alert and oriented to person, place,  and time.  Skin: Skin is warm and dry. Capillary refill takes less than 2  seconds.  Three erythematous papules, two appear to be scars, on neck, one active on chest  Psychiatric:  anxious    Labs reviewed: Basic Metabolic Panel: Recent Labs    11/20/17 0941  NA 139  K 4.5  CL 102  CO2 30  GLUCOSE 105*  BUN 17  CREATININE 1.10  CALCIUM 9.0   Liver Function Tests: Recent Labs    11/20/17 0941  AST 26  ALT 21  BILITOT 1.3*  PROT 6.7   No results for input(s): LIPASE, AMYLASE in the last 8760 hours. No results for input(s): AMMONIA in the last 8760 hours. CBC: Recent Labs    11/20/17 0941  WBC 5.8  NEUTROABS 3,486  HGB 16.1  HCT 46.2  MCV 91.7  PLT 238   Cardiac Enzymes: No results for input(s): CKTOTAL, CKMB, CKMBINDEX, TROPONINI in the last 8760 hours. BNP: Invalid input(s): POCBNP Lab Results  Component Value Date   HGBA1C 5.8 (H) 10/07/2016   Lab Results  Component Value Date   TSH 3.340 10/17/2014   Assessment/Plan 1. Annual physical exam -performed today, refuses flu shot b/c says pulmonologist told him his immunity is low and he might have a reaction to it  2. Essential hypertension -bp at goal  3. Coronary artery disease involving coronary bypass graft without angina pectoris, unspecified whether native or transplanted heart -cont secondary prevention --plans to go back to gym to get ldl to goal, cont crestor  4. Anxiety state -ongoing, cont valium and xanax as per geriatric psychiatry at [facility]  -advised pt to call us when he needs more refills   5. Mixed obsessional thoughts and acts -ongoing, cont valium and xanax  6. Pancreas cyst -f/u MRI in 05/2019   7. Spinal stenosis of lumbar region without neurogenic claudication -recommended a tylenol each night before bed to try to keep him from tossing and turning  Labs/tests ordered:  No orders of the defined types were placed in this encounter.  Kamika Goodloe L. Royalty Domagala, D.O. [facility] [address], [place]  [address] Cell Phone (Mon-Fri 8am-5pm):  [phone] On Call:  [phone] & follow prompts after 5pm & weekends Office Phone:  [phone] Office Fax:  [phone]

## 2018-01-01 ENCOUNTER — Telehealth: Payer: Self-pay | Admitting: Physician Assistant

## 2018-01-01 ENCOUNTER — Other Ambulatory Visit: Payer: Self-pay | Admitting: Internal Medicine

## 2018-01-01 ENCOUNTER — Encounter: Payer: Self-pay | Admitting: Physician Assistant

## 2018-01-01 ENCOUNTER — Ambulatory Visit: Payer: Medicare Other | Admitting: Physician Assistant

## 2018-01-01 VITALS — BP 126/80 | HR 96 | Temp 97.5°F | Resp 17 | Ht 70.0 in | Wt 189.0 lb

## 2018-01-01 DIAGNOSIS — B9689 Other specified bacterial agents as the cause of diseases classified elsewhere: Secondary | ICD-10-CM

## 2018-01-01 DIAGNOSIS — F411 Generalized anxiety disorder: Secondary | ICD-10-CM

## 2018-01-01 DIAGNOSIS — J019 Acute sinusitis, unspecified: Secondary | ICD-10-CM

## 2018-01-01 MED ORDER — DOXYCYCLINE HYCLATE 100 MG PO CAPS: 100.0000 mg | ORAL_CAPSULE | Freq: Two times a day (BID) | ORAL | 0 refills | Status: DC

## 2018-01-01 MED ORDER — AZITHROMYCIN 250 MG PO TABS: ORAL_TABLET | ORAL | 0 refills | Status: AC

## 2018-01-01 NOTE — Patient Instructions (Addendum)
Call if you are not getting better.      IF you received an x-ray today, you will receive an invoice from [facility]. Please contact [facility] at [phone] with questions or concerns regarding your invoice.   IF you received labwork today, you will receive an invoice from [facility]. Please contact [facility] at [phone] with questions or concerns regarding your invoice.   Our billing staff will not be able to assist you with questions regarding bills from these companies.  You will be contacted with the lab results as soon as they are available. The fastest way to get your results is to activate your My Chart account. Instructions are located on the last page of this paperwork. If you have not heard from [place] regarding the results in 2 weeks, please contact this office.

## 2018-01-01 NOTE — Telephone Encounter (Signed)
He can try some afrin if pressure does not make the bleeding stop. Philis Fendt, MS, PA-C 3:22 PM, 01/01/2018

## 2018-01-01 NOTE — Telephone Encounter (Signed)
Pt saw you today for a bacterial sinus infection. He came back in a little after he left wanting to speak with you.  He says he had a bad coughing fit and then his nose started to bleed heavily.  I think it scared him more than anything. [phone]

## 2018-01-01 NOTE — Progress Notes (Signed)
01/06/2018 8:15 AM   DOB: 1936-10-20 / MRN: [id number]  SUBJECTIVE:  Alexander [name] is a 82 y.o. male presenting for nasal congestion.  Of note he has allergies to penicillin and Levaquin.  Has a history of allergic rhinitis.  He denies fever, teeth pain, headache.  He has a history of acute bacterial rhinosinusitis and worries that this may be a recurrence of his disease.  He is allergic to heparin; penicillin g; levaquin [levofloxacin in d5w]; aspirin; codeine; oxycodone; sucralfate; zetia [ezetimibe]; zocor [simvastatin]; and plavix [clopidogrel bisulfate].   He  has a past medical history of Allergic rhinitis due to pollen, Anginal pain ([place]), Anxiety, Anxiety disorder, Asthma, Atrial fibrillation (HCC), Basal cell carcinoma of skin of lip, Clotting disorder ([employer]), Coronary atherosclerosis of native coronary artery, Diastasis recti (05/07/2016), Difficulty urinating, Diverticulosis of colon (without mention of hemorrhage), Dysuria, Edema, Essential hypertension, benign, Heart attack ([place]) (9381), Helicobacter pylori (H. 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[place]), History of anal fissures, History of cholelithiasis, Hyperlipidemia, Hypertrophy of prostate with urinary obstruction and other lower urinary tract symptoms (LUTS), Impotence of organic origin, Internal hemorrhoids without mention of complication, Jaundice (age 89), Long term (current) use of anticoagulants, Neutropenia ([place]) (10/07/2016), Nonspecific elevation of levels of transaminase or lactic acid dehydrogenase (LDH), Other voice and resonance disorders, Pneumonia, Pulmonary embolism ([place]) (2012), Pulmonary embolism ([place]), Rheumatic fever (age 42), Skin cancer, Sleep apnea, and Unspecified constipation.    He  reports that he quit smoking about 40 years ago. His smoking use included cigarettes. He started smoking about 61 years ago. He has a 22.00 pack-year smoking history. he has never used smokeless tobacco. He reports that he  drinks alcohol. He reports that he does not use drugs. He  reports that he currently engages in sexual activity and has had partners who are Male. The patient  has a past surgical history that includes Hernia repair (2010); Scrotum exploration (1990's); Mohs surgery; Vasectomy (04/1992); SUPERFICIAL SKIN CYSTECTOMY (l) HIP (Right, 1997); Hydrocele surgery; Spermatocelectomy (02/1999); Cholecystectomy (N/A, 11/18/2013); Cardiac catheterization; and Coronary artery bypass graft (2012).  His family history includes Bone cancer in his mother; Diabetes in his mother.  Review of Systems  Constitutional: Negative for chills, diaphoresis and fever.  Respiratory: Negative for cough, hemoptysis, sputum production, shortness of breath and wheezing.   Cardiovascular: Negative for chest pain, orthopnea and leg swelling.  Gastrointestinal: Negative for nausea.  Skin: Negative for rash.  Neurological: Negative for dizziness.    The problem list and medications were reviewed and updated by myself where necessary and exist elsewhere in the encounter.   OBJECTIVE:  BP 126/80   Pulse 96   Temp (!) 97.5 F (36.4 C) (Oral)   Resp 17   Ht 5\' 10"  (1.778 m)   Wt 189 lb (85.7 kg)   SpO2 98%   BMI 27.12 kg/m   Physical Exam  Constitutional: He appears well-developed. He is active and cooperative.  Non-toxic appearance.  HENT:  Right Ear: Hearing, tympanic membrane, external ear and ear canal normal.  Left Ear: Hearing, tympanic membrane, external ear and ear canal normal.  Nose: Mucosal edema (Fiery red and swollen nasal turbinates) present. Right sinus exhibits maxillary sinus tenderness. Right sinus exhibits no frontal sinus tenderness. Left sinus exhibits maxillary sinus tenderness. Left sinus exhibits no frontal sinus tenderness.  Mouth/Throat: Uvula is midline, oropharynx is clear and moist and mucous membranes are normal. No oropharyngeal exudate, posterior oropharyngeal edema or tonsillar abscesses.    Eyes: Conjunctivae are normal.  Pupils are equal, round, and reactive to light.  Cardiovascular: Normal rate, regular rhythm, S1 normal, S2 normal, normal heart sounds, intact distal pulses and normal pulses. Exam reveals no gallop and no friction rub.  No murmur heard. Pulmonary/Chest: Effort normal. No stridor. No tachypnea. No respiratory distress. He has no wheezes. He has no rales.  Abdominal: He exhibits no distension.  Musculoskeletal: He exhibits no edema.  Lymphadenopathy:       Head (right side): No submandibular and no tonsillar adenopathy present.       Head (left side): No submandibular and no tonsillar adenopathy present.    He has no cervical adenopathy.  Neurological: He is alert.  Skin: Skin is warm and dry. He is not diaphoretic. No pallor.  Vitals reviewed.   No results found for this or any previous visit (from the past 72 hour(s)).  No results found.  ASSESSMENT AND PLAN:  Londyn was seen today for nasal congestion.  Diagnoses and all orders for this visit:  Acute bacterial sinusitis -     azithromycin (ZITHROMAX) 250 MG tablet; Take 2 tabs PO x 1 dose, then 1 tab PO QD x 4 days  Other orders -     Discontinue: doxycycline (VIBRAMYCIN) 100 MG capsule; Take 1 capsule (100 mg total) by mouth 2 (two) times daily for 10 days. Take only if you are getting worse.    The patient is advised to call or return to clinic if he does not see an improvement in symptoms, or to seek the care of the closest emergency department if he worsens with the above plan.   Philis Fendt, MHS, PA-C [facility] 01/06/2018 8:15 AM

## 2018-01-01 NOTE — Telephone Encounter (Signed)
rx called into pharmacy

## 2018-01-01 NOTE — Telephone Encounter (Signed)
Please see note below. 

## 2018-01-02 DIAGNOSIS — J22 Unspecified acute lower respiratory infection: Secondary | ICD-10-CM | POA: Diagnosis not present

## 2018-01-02 DIAGNOSIS — R509 Fever, unspecified: Secondary | ICD-10-CM | POA: Diagnosis not present

## 2018-01-09 DIAGNOSIS — J22 Unspecified acute lower respiratory infection: Secondary | ICD-10-CM | POA: Diagnosis not present

## 2018-01-09 DIAGNOSIS — R05 Cough: Secondary | ICD-10-CM | POA: Diagnosis not present

## 2018-02-16 DIAGNOSIS — L821 Other seborrheic keratosis: Secondary | ICD-10-CM | POA: Diagnosis not present

## 2018-02-16 DIAGNOSIS — D225 Melanocytic nevi of trunk: Secondary | ICD-10-CM | POA: Diagnosis not present

## 2018-02-16 DIAGNOSIS — L300 Nummular dermatitis: Secondary | ICD-10-CM | POA: Diagnosis not present

## 2018-02-16 DIAGNOSIS — D2261 Melanocytic nevi of right upper limb, including shoulder: Secondary | ICD-10-CM | POA: Diagnosis not present

## 2018-03-18 ENCOUNTER — Other Ambulatory Visit: Payer: Self-pay | Admitting: Internal Medicine

## 2018-03-18 DIAGNOSIS — F411 Generalized anxiety disorder: Secondary | ICD-10-CM

## 2018-03-18 NOTE — Telephone Encounter (Signed)
A medication refill was received from pharmacy for diazepam 5 mg and alprazolam 0.5 mg. Rx was called in to pharmacy after verifying last fill date, provider, and quantity on PMP [place].

## 2018-05-06 ENCOUNTER — Ambulatory Visit: Payer: Medicare Other | Admitting: Internal Medicine

## 2018-05-06 ENCOUNTER — Encounter: Payer: Self-pay | Admitting: Internal Medicine

## 2018-05-06 VITALS — BP 140/70 | HR 67 | Temp 98.0°F | Ht 70.0 in | Wt 193.0 lb

## 2018-05-06 DIAGNOSIS — I25810 Atherosclerosis of coronary artery bypass graft(s) without angina pectoris: Secondary | ICD-10-CM

## 2018-05-06 DIAGNOSIS — Z6827 Body mass index (BMI) 27.0-27.9, adult: Secondary | ICD-10-CM

## 2018-05-06 DIAGNOSIS — K862 Cyst of pancreas: Secondary | ICD-10-CM | POA: Diagnosis not present

## 2018-05-06 DIAGNOSIS — B370 Candidal stomatitis: Secondary | ICD-10-CM

## 2018-05-06 DIAGNOSIS — I10 Essential (primary) hypertension: Secondary | ICD-10-CM | POA: Diagnosis not present

## 2018-05-06 DIAGNOSIS — F411 Generalized anxiety disorder: Secondary | ICD-10-CM

## 2018-05-06 MED ORDER — NYSTATIN 100000 UNIT/ML MT SUSP: 5.0000 mL | Freq: Four times a day (QID) | OROMUCOSAL | 0 refills | Status: DC

## 2018-05-06 NOTE — Patient Instructions (Signed)
Fat and Cholesterol Restricted Diet Getting too much fat and cholesterol in your diet may cause health problems. Following this diet helps keep your fat and cholesterol at normal levels. This can keep you from getting sick. What types of fat should I choose?  Choose monosaturated and polyunsaturated fats. These are found in foods such as olive oil, canola oil, flaxseeds, walnuts, almonds, and seeds.  Eat more omega-3 fats. Good choices include salmon, mackerel, sardines, tuna, flaxseed oil, and ground flaxseeds.  Limit saturated fats. These are in animal products such as meats, butter, and cream. They can also be in plant products such as palm oil, palm kernel oil, and coconut oil.  Avoid foods with partially hydrogenated oils in them. These contain trans fats. Examples of foods that have trans fats are stick margarine, some tub margarines, cookies, crackers, and other baked goods. What general guidelines do I need to follow?  Check food labels. Look for the words "trans fat" and "saturated fat."  When preparing a meal: ? Fill half of your plate with vegetables and green salads. ? Fill one fourth of your plate with whole grains. Look for the word "whole" as the first word in the ingredient list. ? Fill one fourth of your plate with lean protein foods.  Eat more foods that have fiber, like apples, carrots, beans, peas, and barley.  Eat more home-cooked foods. Eat less at restaurants and buffets.  Limit or avoid alcohol.  Limit foods high in starch and sugar.  Limit fried foods.  Cook foods without frying them. Baking, boiling, grilling, and broiling are all great options.  Lose weight if you are overweight. Losing even a small amount of weight can help your overall health. It can also help prevent diseases such as diabetes and heart disease. What foods can I eat? Grains Whole grains, such as whole wheat or whole grain breads, crackers, cereals, and pasta. Unsweetened oatmeal,  bulgur, barley, quinoa, or brown rice. Corn or whole wheat flour tortillas. Vegetables Fresh or frozen vegetables (raw, steamed, roasted, or grilled). Green salads. Fruits All fresh, canned (in natural juice), or frozen fruits. Meat and Other Protein Products Ground beef (85% or leaner), grass-fed beef, or beef trimmed of fat. Skinless chicken or turkey. Ground chicken or turkey. Pork trimmed of fat. All fish and seafood. Eggs. Dried beans, peas, or lentils. Unsalted nuts or seeds. Unsalted canned or dry beans. Dairy Low-fat dairy products, such as skim or 1% milk, 2% or reduced-fat cheeses, low-fat ricotta or cottage cheese, or plain low-fat yogurt. Fats and Oils Tub margarines without trans fats. Light or reduced-fat mayonnaise and salad dressings. Avocado. Olive, canola, sesame, or safflower oils. Natural peanut or almond butter (choose ones without added sugar and oil). The items listed above may not be a complete list of recommended foods or beverages. Contact your dietitian for more options. What foods are not recommended? Grains White bread. White pasta. White rice. Cornbread. Bagels, pastries, and croissants. Crackers that contain trans fat. Vegetables White potatoes. Corn. Creamed or fried vegetables. Vegetables in a cheese sauce. Fruits Dried fruits. Canned fruit in light or heavy syrup. Fruit juice. Meat and Other Protein Products Fatty cuts of meat. Ribs, chicken wings, bacon, sausage, bologna, salami, chitterlings, fatback, hot dogs, bratwurst, and packaged luncheon meats. Liver and organ meats. Dairy Whole or 2% milk, cream, half-and-half, and cream cheese. Whole milk cheeses. Whole-fat or sweetened yogurt. Full-fat cheeses. Nondairy creamers and whipped toppings. Processed cheese, cheese spreads, or cheese curds. Sweets and Desserts Corn   syrup, sugars, honey, and molasses. Candy. Jam and jelly. Syrup. Sweetened cereals. Cookies, pies, cakes, donuts, muffins, and ice  cream. Fats and Oils Butter, stick margarine, lard, shortening, ghee, or bacon fat. Coconut, palm kernel, or palm oils. Beverages Alcohol. Sweetened drinks (such as sodas, lemonade, and fruit drinks or punches). The items listed above may not be a complete list of foods and beverages to avoid. Contact your dietitian for more information. This information is not intended to replace advice given to you by your health care provider. Make sure you discuss any questions you have with your health care provider. Document Released: 05/25/2012 Document Revised: 07/31/2016 Document Reviewed: 02/23/2014 Elsevier Interactive Patient Education  2018 Elsevier Inc.  

## 2018-05-06 NOTE — Progress Notes (Signed)
Location:  [facility] clinic Provider:  Samanthajo Payano L. Mariea Clonts, D.O., C.M.D.  Code Status: Full code Goals of Care:  Advanced Directives 05/06/2018  Does Patient Have a Medical Advance Directive? No  Type of Advance Directive -  Does patient want to make changes to medical advance directive? -  Copy of [place] in Chart? -  Would patient like information on creating a medical advance directive? No - Patient declined  Pre-existing out of facility DNR order (yellow form or pink MOST form) -     Chief Complaint  Patient presents with  . Medical Management of Chronic Issues    39mth follow-up    HPI: Patient is a 82 y.o. male seen today for medical management of chronic diseases.    Since he was last here, about a week after his last appt in January, he was seen at urgent care due to nasal congestion. He was treated for recurrent bacterial rhinosinusitis with a zpak.  He then had a nosebleed and afrin was recommended if pressure did not stop the bleeding.  He reports it was not really a sinus infection.  He believes he caught the virus people here were having.  He says it was pneumonia when he went to [place] clinic instead the following day.  Right base pneumonia.  He says he coughed up phlegm like when he had his open heart surgery and got pneumonia.  He says this time, he's just now getting to where he does not cough anymore.  He did have a f/u visit and he completed amoxicillin for 10 days.  He gets his albuterol inhaler through the [place], but infrequently.  He does not sleep well.  He has mild OSA and tosses and turns so can't keep mask in place. The albuterol keeps him awake.  Has not had anymore afib since his full maze procedure after his cabg.  Appetite is back.   Reports a lot of sickness and death in friends.  Says he's had to rise above it.    Personally, he's doing pretty well.  Is walking on a regular routine again.  Stopped lifting after 25 years due to his back.  Only able  to do light weights now.  Had a fall in 1984 that injured the lower back, but did run for 8 more years after that.  Just deals with the pain with tylenol.  Avoids pain pills.    He was washing in the shower and got water in the right ear and it's still squishy.  It's a little better now today.    He's had some thrush. He uses flonase infrequently and his deviated septum gets irritated.  He has had some since that abx for pneumonia.  Asks about following up about the MRI cyst in the pancreas--2 years from June 2018 was recommended.  Past Medical History:  Diagnosis Date  . Allergic rhinitis due to pollen   . Anginal pain ([place])    once in a while, none recent  . Anxiety   . Anxiety disorder   . Asthma    exercise induced  . Atrial fibrillation (HCC)    hx of  . Basal cell carcinoma of skin of lip   . Clotting disorder ([place])   . Coronary atherosclerosis of native coronary artery    with CABG 3/12  -MAZE procedure   . Diastasis recti 05/07/2016  . Difficulty urinating   . Diverticulosis of colon (without mention of hemorrhage)    mild  .  Dysuria   . Edema   . Essential hypertension, benign   . Heart attack ([place]) 2012  . Helicobacter pylori (H. pylori)   . 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 History of anal fissures   . History of cholelithiasis   . Hyperlipidemia   . Hypertrophy of prostate with urinary obstruction and other lower urinary tract symptoms (LUTS)   . Impotence of organic origin   . Internal hemorrhoids without mention of complication   . Jaundice age 34  . Long term (current) use of anticoagulants   . Neutropenia ([place]) 10/07/2016  . Nonspecific elevation of levels of transaminase or lactic acid dehydrogenase (LDH)   . Other voice and resonance disorders    after surgery, took breathing tube out and damaged something  . Pneumonia   . Pulmonary embolism ([place]) 2012   s/p heart surgery  . Pulmonary embolism ([place])    3/12  . Rheumatic fever age 79    . Skin cancer    Hx of squamous cell x2  . Sleep apnea    no CPAP  . Unspecified constipation     Past Surgical History:  Procedure Laterality Date  . CARDIAC CATHETERIZATION    . CHOLECYSTECTOMY N/A 11/18/2013   Procedure: LAPAROSCOPIC CHOLECYSTECTOMY With IOC;  Surgeon: Odis Hollingshead, MD;  Location: [facility];  Service: General;  Laterality: N/A;  . CORONARY ARTERY BYPASS GRAFT  2012   Median sternotomy, extracorporeal circulation, coronary  . HERNIA REPAIR  2010   bilateral  . HYDROCELE EXCISION     dr [name]   . MOHS SURGERY     squamus x2; BCC fo bridge of nose June 2017.  Marland Kitchen SCROTUM EXPLORATION  1990's   multiple  . SPERMATOCELECTOMY  02/1999   Right, Dr.[name]  . SUPERFICIAL SKIN CYSTECTOMY (l) HIP Right 1997   dr [name]  . VASECTOMY  04/1992    Allergies  Allergen Reactions  . Heparin     Blood clots   . Penicillin G Shortness Of Breath  . Levaquin [Levofloxacin In D5w] Other (See Comments)    Patient felt jittery and nervous with insomnia on the medication.  . Aspirin     Breathing complications, can tolerate baby asa  . Codeine     Some is ok  . Oxycodone     Can not take percocet, palpitations  . Sucralfate     Could not sleep  . Zetia [Ezetimibe]     jittery  . Zocor [Simvastatin]     Liver enzymes increased  . Plavix [Clopidogrel Bisulfate] Palpitations    Outpatient Encounter Medications as of 05/06/2018  Medication Sig  . albuterol (PROVENTIL HFA;VENTOLIN HFA) 108 (90 BASE) MCG/ACT inhaler Inhale 2 puffs into the lungs every 6 (six) hours as needed for wheezing.  Marland Kitchen ALPRAZolam (XANAX) 0.5 MG tablet TAKE 1 TO 2 TABLETS BY MOUTH AS NEEDED FOR ANXIETY  . aspirin 81 MG tablet Take 81 mg by mouth 3 (three) times a week. Mondays, wednesdays and fridays  . cholecalciferol (VITAMIN D) 1000 UNITS tablet Take 1,000 Units by mouth daily.  . diazepam (VALIUM) 5 MG tablet TAKE 1 TO 2 TABLETS BY MOUTH AS NEEDED FOR TO HELP REST  . omeprazole (PRILOSEC) 20 MG  capsule Take 1 capsule (20 mg total) by mouth daily as needed (heartburn).  . rosuvastatin (CRESTOR) 10 MG tablet Take 1 tablet (10 mg total) by mouth daily.   No facility-administered encounter medications on file as of 05/06/2018.  Review of Systems:  Review of Systems  Constitutional: Negative for chills and fever.  HENT: Negative for congestion.        Noise in right ear; white tongue and red patches in throat, bit side of mouth on the right  Eyes: Negative for blurred vision.  Respiratory: Negative for cough, sputum production, shortness of breath and wheezing.   Cardiovascular: Negative for chest pain, palpitations and leg swelling.  Gastrointestinal: Negative for abdominal pain, blood in stool, constipation and melena.  Genitourinary: Negative for dysuria.  Musculoskeletal: Positive for back pain. Negative for falls, joint pain and myalgias.  Skin: Negative for itching and rash.  Neurological: Negative for dizziness and loss of consciousness.  Endo/Heme/Allergies: Does not bruise/bleed easily.  Psychiatric/Behavioral: Negative for depression and memory loss. The patient is nervous/anxious and has insomnia.     Health Maintenance  Topic Date Due  . PNA vac Low Risk Adult (2 of 2 - PPSV23) 10/03/2015  . TETANUS/TDAP  12/14/2017  . INFLUENZA VACCINE  07/08/2018    Physical Exam: Vitals:   05/06/18 1306  BP: 140/70  Pulse: 67  Temp: 98 F (36.7 C)  TempSrc: Oral  SpO2: 98%  Weight: 193 lb (87.5 kg)  Height: 5\' 10"  (1.778 m)   Body mass index is 27.69 kg/m. Physical Exam  Constitutional: He is oriented to person, place, and time. He appears well-developed and well-nourished. No distress.  HENT:  Slight white plaque on tongue, small area of redness on roof of mouth, ulcerated area on right buccal surface; small piece of cerumen in right ear canal, moist appearing  Cardiovascular: Normal rate, regular rhythm, normal heart sounds and intact distal pulses.    Pulmonary/Chest: Effort normal and breath sounds normal. No stridor. No respiratory distress. He has no wheezes. He has no rales.  Abdominal: Bowel sounds are normal.  Musculoskeletal: Normal range of motion.  Neurological: He is alert and oriented to person, place, and time.  Skin: Skin is warm and dry.  Psychiatric:  A bit fidgety    Labs reviewed: Basic Metabolic Panel: Recent Labs    11/20/17 0941  NA 139  K 4.5  CL 102  CO2 30  GLUCOSE 105*  BUN 17  CREATININE 1.10  CALCIUM 9.0   Liver Function Tests: Recent Labs    11/20/17 0941  AST 26  ALT 21  BILITOT 1.3*  PROT 6.7   No results for input(s): LIPASE, AMYLASE in the last 8760 hours. No results for input(s): AMMONIA in the last 8760 hours. CBC: Recent Labs    11/20/17 0941  WBC 5.8  NEUTROABS 3,486  HGB 16.1  HCT 46.2  MCV 91.7  PLT 238   Lipid Panel: Recent Labs    11/20/17 0941  CHOL 159  HDL 63  LDLCALC 82  TRIG 64  CHOLHDL 2.5   Lab Results  Component Value Date   HGBA1C 5.8 (H) 10/07/2016    Assessment/Plan 1. Body mass index 27.0-27.9, adult -in overweight range, provided low fat, low cholesterol diet for patient - CBC with Differential/Platelet; Future - COMPLETE METABOLIC PANEL WITH GFR; Future - Lipid panel; Future -continue walking and home light weight workout  2. Pancreas cyst -due for repeat MRI in June 2020 to confirm benign appearance  3. Essential hypertension -bp at upper limits of normal today, monitor - COMPLETE METABOLIC PANEL WITH GFR; Future  4. Coronary artery disease involving coronary bypass graft without angina pectoris, unspecified whether native or transplanted heart -no recent symptoms, doing well on  current regimen - COMPLETE METABOLIC PANEL WITH GFR; Future - Lipid panel; Future  5. Anxiety state -ongoing, uses benzos for this with benefit  6. Oral thrush - mild, likely due to his abx, flonase -will tx -nystatin (MYCOSTATIN) 100000 UNIT/ML  suspension; Take 5 mLs (500,000 Units total) by mouth 4 (four) times daily.  Dispense: 60 mL; Refill: 0 -if nystatin not available, will send clotrimazole troches  Labs/tests ordered:   Orders Placed This Encounter  Procedures  . CBC with Differential/Platelet    Standing Status:   Future    Standing Expiration Date:   01/06/2019  . COMPLETE METABOLIC PANEL WITH GFR    Standing Status:   Future    Standing Expiration Date:   01/06/2019  . Lipid panel    Standing Status:   Future    Standing Expiration Date:   01/06/2019    Next appt:  09/16/2018  Kamree Wiens L. Rebbie Lauricella, D.O. [facility] [address], [place] [address] Cell Phone (Mon-Fri 8am-5pm):  [phone] On Call:  [phone] & follow prompts after 5pm & weekends Office Phone:  [phone] Office Fax:  [phone]

## 2018-05-14 ENCOUNTER — Other Ambulatory Visit: Payer: Self-pay | Admitting: Internal Medicine

## 2018-05-14 DIAGNOSIS — F411 Generalized anxiety disorder: Secondary | ICD-10-CM

## 2018-05-14 NOTE — Telephone Encounter (Signed)
Ok to fill? Pharmacy with risk of " respiratory depression", warning also came up in Epic.

## 2018-05-23 NOTE — Progress Notes (Signed)
Cardiology Office Note    Date:  05/24/2018   ID:  [name], DOB 1936-03-18, MRN [id number]  PCP:  [name], DO  Cardiologist: Sinclair Grooms, MD   Chief Complaint  Patient presents with  . Coronary Artery Disease  . Atrial Fibrillation    History of Present Illness:  Alexander [name] is a 82 y.o. male who presents for follow-up of CAD/CABG and cardiovascular risk factors. He denies angina. He has had prior coronary bypass graftingIn 2012 with LIMA to LAD, SVG to distal right, and SVG to the first diagonal. He has relative statin intolerance although he is able to take very low dose Crestor. Other medical problems include hypertension and asthma.  He is exercising regularly.  Not walking as much as previously but does walk 3 miles 3 times per week.  Does a lot of isometric calisthenics.  He does not have any exertion related chest discomfort, claudication, or neurological complaints.   Past Medical History:  Diagnosis Date  . Allergic rhinitis due to pollen   . Anginal pain ([place])    once in a while, none recent  . Anxiety   . Anxiety disorder   . Asthma    exercise induced  . Atrial fibrillation (HCC)    hx of  . Basal cell carcinoma of skin of lip   . Clotting disorder ([place])   . Coronary atherosclerosis of native coronary artery    with CABG 3/12  -MAZE procedure   . Diastasis recti 05/07/2016  . Difficulty urinating   . Diverticulosis of colon (without mention of hemorrhage)    mild  . Dysuria   . Edema   . Essential hypertension, benign   . Heart attack ([place]) 2012  . Helicobacter pylori (H. pylori)   . 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 History of anal fissures   . History of cholelithiasis   . Hyperlipidemia   . Hypertrophy of prostate with urinary obstruction and other lower urinary tract symptoms (LUTS)   . Impotence of organic origin   . Internal hemorrhoids without mention of complication   . Jaundice age 88  . Long term  (current) use of anticoagulants   . Neutropenia ([place]) 10/07/2016  . Nonspecific elevation of levels of transaminase or lactic acid dehydrogenase (LDH)   . Other voice and resonance disorders    after surgery, took breathing tube out and damaged something  . Pneumonia   . Pulmonary embolism ([place]) 2012   s/p heart surgery  . Pulmonary embolism ([place])    3/12  . Rheumatic fever age 5  . Skin cancer    Hx of squamous cell x2  . Sleep apnea    no CPAP  . Unspecified constipation     Past Surgical History:  Procedure Laterality Date  . CARDIAC CATHETERIZATION    . CHOLECYSTECTOMY N/A 11/18/2013   Procedure: LAPAROSCOPIC CHOLECYSTECTOMY With IOC;  Surgeon: Odis Hollingshead, MD;  Location: [facility];  Service: General;  Laterality: N/A;  . CORONARY ARTERY BYPASS GRAFT  2012   Median sternotomy, extracorporeal circulation, coronary  . HERNIA REPAIR  2010   bilateral  . HYDROCELE EXCISION     dr [name]   . MOHS SURGERY     squamus x2; BCC fo bridge of nose June 2017.  Marland Kitchen SCROTUM EXPLORATION  1990's   multiple  . SPERMATOCELECTOMY  02/1999   Right, Dr.[name]  . SUPERFICIAL SKIN CYSTECTOMY (l) HIP Right 1997  dr [name]  . VASECTOMY  04/1992    Current Medications: Outpatient Medications Prior to Visit  Medication Sig Dispense Refill  . albuterol (PROVENTIL HFA;VENTOLIN HFA) 108 (90 BASE) MCG/ACT inhaler Inhale 2 puffs into the lungs every 6 (six) hours as needed for wheezing. 1 Inhaler 0  . ALPRAZolam (XANAX) 0.5 MG tablet TAKE 1-2 TABLETS BY MOUTH AS NEEDED FOR ANXIETY 60 tablet 0  . aspirin 81 MG tablet Take 81 mg by mouth 3 (three) times a week. Mondays, wednesdays and fridays    . cholecalciferol (VITAMIN D) 1000 UNITS tablet Take 1,000 Units by mouth daily.    . diazepam (VALIUM) 5 MG tablet TAKE 1-2 TABLET BY MOUTH AS NEEDED TO HELP REST 60 tablet 0  . omeprazole (PRILOSEC) 20 MG capsule Take 1 capsule (20 mg total) by mouth daily as needed (heartburn). 30 capsule 6  .  rosuvastatin (CRESTOR) 10 MG tablet Take 1 tablet (10 mg total) by mouth daily. 30 tablet 11  . nystatin (MYCOSTATIN) 100000 UNIT/ML suspension Take 5 mLs (500,000 Units total) by mouth 4 (four) times daily. (Patient not taking: Reported on 05/24/2018) 60 mL 0   No facility-administered medications prior to visit.      Allergies:   Heparin; Penicillin g; Levaquin [levofloxacin in d5w]; Aspirin; Codeine; Oxycodone; Sucralfate; Zetia [ezetimibe]; Zocor [simvastatin]; and Plavix [clopidogrel bisulfate]   Social History   Socioeconomic History  . Marital status: Married    Spouse name: Not on file  . Number of children: 1  . Years of education: Not on file  . Highest education level: Not on file  Occupational History  . Occupation: Presenter, broadcasting: [employer]  . Financial resource strain: Not hard at all  . Food insecurity:    Worry: Never true    Inability: Never true  . Transportation needs:    Medical: No    Non-medical: No  Tobacco Use  . Smoking status: Former Smoker    Packs/day: 1.00    Years: 22.00    Pack years: 22.00    Types: Cigarettes    Start date: 08/22/1956    Last attempt to quit: 12/08/1977    Years since quitting: 40.4  . Smokeless tobacco: Never Used  . Tobacco comment: significant second-hand smoke exposure at work  Substance and Sexual Activity  . Alcohol use: Yes    Alcohol/week: 0.0 oz    Comment: former heavy alcohol use, rare use  . Drug use: No  . Sexual activity: Yes    Partners: Female  Lifestyle  . Physical activity:    Days per week: 7 days    Minutes per session: 30 min  . Stress: To some extent  Relationships  . Social connections:    Talks on phone: More than three times a week    Gets together: More than three times a week    Attends religious service: Never    Active member of club or organization: Yes    Attends meetings of clubs or organizations: 1 to 4 times per year    Relationship status: Married   Other Topics Concern  . Not on file  Social History Narrative   Daily caffeine       [name]   [name]   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 No pets currently. No hot tub or bird exposure.    Married wife Cristie Hem     Family History:  The patient's family history includes Bone cancer in his mother; Diabetes in his mother.   ROS:   Please see the history of present illness.    Sternal soreness with certain isometric activities which he has subsequently discontinued.  These were associated with chest discomfort.  After those activities were stopped, the discomfort resolved.  It is not precipitated by activity. All other systems reviewed and are negative.   PHYSICAL EXAM:   VS:  BP 122/68   Pulse 69   Ht 5\' 9"  (1.753 m)   Wt 192 lb 12.8 oz (87.5 kg)   SpO2 96%   BMI 28.47 kg/m    GEN: Well nourished, well developed, in no acute distress  HEENT: normal  Neck: no JVD, carotid bruits, or masses Cardiac: RRR; no murmurs, rubs, or gallops,no edema  Respiratory:  clear to auscultation bilaterally, normal work of breathing GI: soft, nontender, nondistended, + BS MS: no deformity or atrophy  Skin: warm and dry, no rash Neuro:  Alert and Oriented x 3, Strength and sensation are intact Psych: euthymic mood, full affect  Wt Readings from Last 3 Encounters:  05/24/18 192 lb 12.8 oz (87.5 kg)  05/06/18 193 lb (87.5 kg)  01/01/18 189 lb (85.7 kg)      Studies/Labs Reviewed:   EKG:  EKG normal sinus rhythm with first-degree AV block, unchanged from prior  tracings.  Recent Labs: 11/20/2017: ALT 21; BUN 17; Creat 1.10; Hemoglobin 16.1; Platelets 238; Potassium 4.5; Sodium 139   Lipid Panel    Component Value Date/Time   CHOL 159 11/20/2017 0941   CHOL 168 05/02/2016 0826   TRIG 64 11/20/2017 0941   HDL 63 11/20/2017 0941   HDL 56 05/02/2016 0826   CHOLHDL 2.5 11/20/2017 0941   VLDL 17 10/07/2016 0950   LDLCALC 82 11/20/2017 0941    Additional studies/ records that were reviewed today include:  Normal sinus rhythm with first-degree AV block.  When compared to prior historical tracing findings are stable without new findings.    ASSESSMENT:    1. Coronary artery disease involving coronary bypass graft without angina pectoris, unspecified whether native or transplanted heart   2. Other hyperlipidemia   3. Essential hypertension   4. Angina pectoris ([place])      PLAN:  In order of problems listed above:  1. Stable, without angina.  Recent chest discomfort felt to be musculoskeletal. 2. Most recent LDL was around 80.  He was not eating properly.  Has begun more vigorous exercise and cut back on dietary fats.  Blood work is followed by primary care.  Clinical follow-up with me in 1 year.  LDL cholesterol target is 70. 3. Target is 130/80 mmHg.  Low-salt diet and exercise should be continued.  Tinea status quo.  Call if exertional chest discomfort.  Medication Adjustments/Labs and Tests Ordered: Current medicines are reviewed at length with the patient today.  Concerns regarding medicines are outlined above.  Medication changes, Labs and Tests ordered today are listed in the Patient Instructions below. There are no Patient Instructions on file for this visit.   Signed, Sinclair Grooms, MD  05/24/2018 1:35 PM    [facility] [address] [place], [place]  [address] Phone: [phone]; Fax: [phone]

## 2018-05-24 ENCOUNTER — Ambulatory Visit: Payer: Medicare Other | Admitting: Interventional Cardiology

## 2018-05-24 ENCOUNTER — Encounter: Payer: Self-pay | Admitting: Interventional Cardiology

## 2018-05-24 VITALS — BP 122/68 | HR 69 | Ht 69.0 in | Wt 192.8 lb

## 2018-05-24 DIAGNOSIS — I25810 Atherosclerosis of coronary artery bypass graft(s) without angina pectoris: Secondary | ICD-10-CM | POA: Diagnosis not present

## 2018-05-24 DIAGNOSIS — I209 Angina pectoris, unspecified: Secondary | ICD-10-CM | POA: Diagnosis not present

## 2018-05-24 DIAGNOSIS — I10 Essential (primary) hypertension: Secondary | ICD-10-CM

## 2018-05-24 DIAGNOSIS — E7849 Other hyperlipidemia: Secondary | ICD-10-CM

## 2018-05-24 NOTE — Patient Instructions (Signed)

## 2018-07-20 ENCOUNTER — Other Ambulatory Visit: Payer: Self-pay | Admitting: Internal Medicine

## 2018-07-20 DIAGNOSIS — F411 Generalized anxiety disorder: Secondary | ICD-10-CM

## 2018-07-21 ENCOUNTER — Encounter

## 2018-07-21 ENCOUNTER — Ambulatory Visit: Payer: Medicare Other | Admitting: Interventional Cardiology

## 2018-07-21 NOTE — Telephone Encounter (Signed)
[place] Database verified and compliance confirmed   

## 2018-08-25 DIAGNOSIS — M48061 Spinal stenosis, lumbar region without neurogenic claudication: Secondary | ICD-10-CM | POA: Diagnosis not present

## 2018-09-06 ENCOUNTER — Other Ambulatory Visit: Payer: Self-pay | Admitting: Internal Medicine

## 2018-09-06 DIAGNOSIS — F411 Generalized anxiety disorder: Secondary | ICD-10-CM

## 2018-09-10 ENCOUNTER — Other Ambulatory Visit: Payer: Medicare Other

## 2018-09-10 DIAGNOSIS — I25810 Atherosclerosis of coronary artery bypass graft(s) without angina pectoris: Secondary | ICD-10-CM | POA: Diagnosis not present

## 2018-09-10 DIAGNOSIS — Z6827 Body mass index (BMI) 27.0-27.9, adult: Secondary | ICD-10-CM

## 2018-09-10 DIAGNOSIS — I10 Essential (primary) hypertension: Secondary | ICD-10-CM | POA: Diagnosis not present

## 2018-09-10 LAB — CBC WITH DIFFERENTIAL/PLATELET
Basophils Absolute: 29 cells/uL (ref 0–200)
Basophils Relative: 0.5 %
Eosinophils Absolute: 58 cells/uL (ref 15–500)
Eosinophils Relative: 1 %
HCT: 45.6 % (ref 38.5–50.0)
Hemoglobin: 15.5 g/dL (ref 13.2–17.1)
Lymphs Abs: 1937 cells/uL (ref 850–3900)
MCH: 31.3 pg (ref 27.0–33.0)
MCHC: 34 g/dL (ref 32.0–36.0)
MCV: 91.9 fL (ref 80.0–100.0)
MPV: 9.2 fL (ref 7.5–12.5)
Monocytes Relative: 8.8 %
Neutro Abs: 3265 cells/uL (ref 1500–7800)
Neutrophils Relative %: 56.3 %
Platelets: 237 10*3/uL (ref 140–400)
RBC: 4.96 10*6/uL (ref 4.20–5.80)
RDW: 12.6 % (ref 11.0–15.0)
Total Lymphocyte: 33.4 %
WBC mixed population: 510 cells/uL (ref 200–950)
WBC: 5.8 10*3/uL (ref 3.8–10.8)

## 2018-09-10 LAB — COMPLETE METABOLIC PANEL WITH GFR
AG Ratio: 1.8 (calc) (ref 1.0–2.5)
ALT: 16 U/L (ref 9–46)
AST: 20 U/L (ref 10–35)
Albumin: 4.2 g/dL (ref 3.6–5.1)
Alkaline phosphatase (APISO): 43 U/L (ref 40–115)
BUN: 13 mg/dL (ref 7–25)
CO2: 29 mmol/L (ref 20–32)
Calcium: 9 mg/dL (ref 8.6–10.3)
Chloride: 105 mmol/L (ref 98–110)
Creat: 0.98 mg/dL (ref 0.70–1.11)
GFR, Est African American: 84 mL/min/{1.73_m2} (ref >=60)
GFR, Est Non African American: 73 mL/min/{1.73_m2} (ref >=60)
Globulin: 2.3 g/dL (calc) (ref 1.9–3.7)
Glucose, Bld: 97 mg/dL (ref 65–99)
Potassium: 4.2 mmol/L (ref 3.5–5.3)
Sodium: 142 mmol/L (ref 135–146)
Total Bilirubin: 1.3 mg/dL — ABNORMAL HIGH (ref 0.2–1.2)
Total Protein: 6.5 g/dL (ref 6.1–8.1)

## 2018-09-10 LAB — LIPID PANEL
Cholesterol: 157 mg/dL (ref <200)
HDL: 53 mg/dL (ref >40)
LDL Cholesterol (Calc): 89 mg/dL (calc)
Non-HDL Cholesterol (Calc): 104 mg/dL (calc) (ref <130)
Total CHOL/HDL Ratio: 3 (calc) (ref <5.0)
Triglycerides: 65 mg/dL (ref <150)

## 2018-09-16 ENCOUNTER — Encounter: Payer: Self-pay | Admitting: Internal Medicine

## 2018-09-16 ENCOUNTER — Ambulatory Visit (INDEPENDENT_AMBULATORY_CARE_PROVIDER_SITE_OTHER): Payer: Medicare Other | Admitting: Internal Medicine

## 2018-09-16 VITALS — BP 128/70 | HR 63 | Temp 98.0°F | Ht 69.0 in | Wt 190.0 lb

## 2018-09-16 DIAGNOSIS — F411 Generalized anxiety disorder: Secondary | ICD-10-CM | POA: Diagnosis not present

## 2018-09-16 DIAGNOSIS — I25810 Atherosclerosis of coronary artery bypass graft(s) without angina pectoris: Secondary | ICD-10-CM

## 2018-09-16 DIAGNOSIS — I10 Essential (primary) hypertension: Secondary | ICD-10-CM

## 2018-09-16 DIAGNOSIS — R0982 Postnasal drip: Secondary | ICD-10-CM

## 2018-09-16 DIAGNOSIS — K862 Cyst of pancreas: Secondary | ICD-10-CM

## 2018-09-16 DIAGNOSIS — M48061 Spinal stenosis, lumbar region without neurogenic claudication: Secondary | ICD-10-CM | POA: Diagnosis not present

## 2018-09-16 NOTE — Progress Notes (Signed)
Location:  [facility] clinic Provider:  Zuha Dejonge L. Mariea Clonts, D.O., C.M.D.  Goals of Care:  Advanced Directives 05/06/2018  Does Patient Have a Medical Advance Directive? No  Type of Advance Directive -  Does patient want to make changes to medical advance directive? -  Copy of [place] in Chart? -  Would patient like information on creating a medical advance directive? No - Patient declined  Pre-existing out of facility DNR order (yellow form or pink MOST form) -   Chief Complaint  Patient presents with  . Medical Management of Chronic Issues    72mth follow-up    HPI: Patient is a 82 y.o. male seen today for medical management of chronic diseases.    He's doing pretty good.    His stenosis and bulging disc.  Sees Dr. Jacqulyn Bath.  He's having to turnover 30 times a night when in bed to get relief.  If he rests on his back, he has to adjust his one leg over the other.  He had an xray that showed bone on bone L4-5.  He used to run and road race.  He stepped off a curb and it jolted his back to where he was about crippled.  About back to where he was before that.  He also has spurs.  He is going to get his MRI done next Saturday, also 4 xrays.  Dr. Jacqulyn Bath plans to refer him to a neurosurgeon to consider options.  Tylenol works ok, but affects his breathing.  He's been sensitive to meds.  Once in a while, he takes old oxycodones 1/2 or 1/3 of it.  He says they make him feel bad the next day.  His sleep is poor--only able to sleep a few hours at a time.   Says melatonin can help him some.  He can tell if he's taken it.  He cannot tolerate antihistamines.    He has a little bit of a cough.  Wonders if it's postnasal drip.  He went to an ENT on [facility] before.  Has dust allergies and something else.  Stopped shots after a couple of years.    MRI of pancreas had shown pseudocyst vs indolent neoplasm.  stable from prior year. He stopped working out at [employer] b/c of his lower back.     Past Medical History:  Diagnosis Date  . Allergic rhinitis due to pollen   . Anginal pain ([place])    once in a while, none recent  . Anxiety   . Anxiety disorder   . Asthma    exercise induced  . Atrial fibrillation (HCC)    hx of  . Basal cell carcinoma of skin of lip   . Clotting disorder ([place])   . Coronary atherosclerosis of native coronary artery    with CABG 3/12  -MAZE procedure   . Diastasis recti 05/07/2016  . Difficulty urinating   . Diverticulosis of colon (without mention of hemorrhage)    mild  . Dysuria   . Edema   . Essential hypertension, benign   . Heart attack ([place]) 2012  . Helicobacter pylori (H. pylori)   . 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 History of anal fissures   . History of cholelithiasis   . Hyperlipidemia   . Hypertrophy of prostate with urinary obstruction and other lower urinary tract symptoms (LUTS)   . Impotence of organic origin   . Internal hemorrhoids without mention of complication   .  Jaundice age 54  . Long term (current) use of anticoagulants   . Neutropenia ([place]) 10/07/2016  . Nonspecific elevation of levels of transaminase or lactic acid dehydrogenase (LDH)   . Other voice and resonance disorders    after surgery, took breathing tube out and damaged something  . Pneumonia   . Pulmonary embolism ([place]) 2012   s/p heart surgery  . Pulmonary embolism ([place])    3/12  . Rheumatic fever age 66  . Skin cancer    Hx of squamous cell x2  . Sleep apnea    no CPAP  . Unspecified constipation     Past Surgical History:  Procedure Laterality Date  . CARDIAC CATHETERIZATION    . CHOLECYSTECTOMY N/A 11/18/2013   Procedure: LAPAROSCOPIC CHOLECYSTECTOMY With IOC;  Surgeon: Odis Hollingshead, MD;  Location: [facility];  Service: General;  Laterality: N/A;  . CORONARY ARTERY BYPASS GRAFT  2012   Median sternotomy, extracorporeal circulation, coronary  . HERNIA REPAIR  2010   bilateral  . HYDROCELE EXCISION     dr [name]   .  MOHS SURGERY     squamus x2; BCC fo bridge of nose June 2017.  Marland Kitchen SCROTUM EXPLORATION  1990's   multiple  . SPERMATOCELECTOMY  02/1999   Right, Dr.[name]  . SUPERFICIAL SKIN CYSTECTOMY (l) HIP Right 1997   dr [name]  . VASECTOMY  04/1992    Allergies  Allergen Reactions  . Heparin     Blood clots   . Penicillin G Shortness Of Breath  . Levaquin [Levofloxacin In D5w] Other (See Comments)    Patient felt jittery and nervous with insomnia on the medication.  . Aspirin     Breathing complications, can tolerate baby asa  . Codeine     Some is ok  . Oxycodone     Can not take percocet, palpitations  . Sucralfate     Could not sleep  . Zetia [Ezetimibe]     jittery  . Zocor [Simvastatin]     Liver enzymes increased  . Plavix [Clopidogrel Bisulfate] Palpitations    Outpatient Encounter Medications as of 09/16/2018  Medication Sig  . albuterol (PROVENTIL HFA;VENTOLIN HFA) 108 (90 BASE) MCG/ACT inhaler Inhale 2 puffs into the lungs every 6 (six) hours as needed for wheezing.  Marland Kitchen ALPRAZolam (XANAX) 0.5 MG tablet TAKE 1 TO 2 TABLETS BY MOUTH AS NEEDED FOR ANXIETY  . aspirin 81 MG tablet Take 81 mg by mouth 3 (three) times a week. Mondays, wednesdays and fridays  . cholecalciferol (VITAMIN D) 1000 UNITS tablet Take 1,000 Units by mouth daily.  . diazepam (VALIUM) 5 MG tablet TAKE 1 TO 2 TABLETS BY MOUTH AS NEEDED TO HELP REST  . omeprazole (PRILOSEC) 20 MG capsule Take 1 capsule (20 mg total) by mouth daily as needed (heartburn).  . rosuvastatin (CRESTOR) 10 MG tablet Take 1 tablet (10 mg total) by mouth daily.   No facility-administered encounter medications on file as of 09/16/2018.     Review of Systems:  Review of Systems  Constitutional: Negative for chills, fever, malaise/fatigue and weight loss.  HENT: Positive for congestion and hearing loss.        Postnasal drip  Eyes: Negative for blurred vision.       Glasses  Respiratory: Positive for cough. Negative for shortness of  breath.   Cardiovascular: Negative for chest pain, palpitations and leg swelling.  Gastrointestinal: Negative for abdominal pain, blood in stool, constipation and melena.  Genitourinary: Negative for dysuria.  Musculoskeletal: Positive for back pain. Negative for falls and joint pain.  Skin: Negative for itching and rash.  Neurological: Negative for dizziness and loss of consciousness.  Endo/Heme/Allergies: Does not bruise/bleed easily.  Psychiatric/Behavioral: Negative for depression and memory loss. The patient is nervous/anxious and has insomnia.     Health Maintenance  Topic Date Due  . PNA vac Low Risk Adult (2 of 2 - PPSV23) 10/03/2015  . TETANUS/TDAP  12/14/2017  . INFLUENZA VACCINE  Completed    Physical Exam: Vitals:   09/16/18 1311  BP: 128/70  Pulse: 63  Temp: 98 F (36.7 C)  TempSrc: Oral  SpO2: 98%  Weight: 190 lb (86.2 kg)  Height: 5\' 9"  (1.753 m)   Body mass index is 28.06 kg/m. Physical Exam  Constitutional: He is oriented to person, place, and time. He appears well-developed and well-nourished. No distress.  HENT:  Head: Normocephalic and atraumatic.  Mouth/Throat: Oropharynx is clear and moist. No oropharyngeal exudate.  Eyes:  Glasses; dlated pupils  Cardiovascular: Normal rate, regular rhythm, normal heart sounds and intact distal pulses.  Pulmonary/Chest: Effort normal and breath sounds normal. No respiratory distress. He has no wheezes. He has no rales.  Musculoskeletal: Normal range of motion.  Neurological: He is alert and oriented to person, place, and time.  Skin: Skin is warm and dry.  Psychiatric:  Slightly pressured speech    Labs reviewed: Basic Metabolic Panel: Recent Labs    11/20/17 0941 09/10/18 0824  NA 139 142  K 4.5 4.2  CL 102 105  CO2 30 29  GLUCOSE 105* 97  BUN 17 13  CREATININE 1.10 0.98  CALCIUM 9.0 9.0   Liver Function Tests: Recent Labs    11/20/17 0941 09/10/18 0824  AST 26 20  ALT 21 16  BILITOT 1.3*  1.3*  PROT 6.7 6.5   No results for input(s): LIPASE, AMYLASE in the last 8760 hours. No results for input(s): AMMONIA in the last 8760 hours. CBC: Recent Labs    11/20/17 0941 09/10/18 0824  WBC 5.8 5.8  NEUTROABS 3,486 3,265  HGB 16.1 15.5  HCT 46.2 45.6  MCV 91.7 91.9  PLT 238 237   Lipid Panel: Recent Labs    11/20/17 0941 09/10/18 0824  CHOL 159 157  HDL 63 53  LDLCALC 82 89  TRIG 64 65  CHOLHDL 2.5 3.0   Lab Results  Component Value Date   HGBA1C 5.8 (H) 10/07/2016    Assessment/Plan 1. Degenerative lumbar spinal stenosis -keep consult with neurosurgery as planned after MRI and xrays are done at the [facility] -will monitor -trying to avoid opioid therapy as much as possible with his benzos for anxiety/ptsd/ocd  2. Coronary artery disease involving coronary bypass graft without angina pectoris, unspecified whether native or transplanted heart -stable, asymptomatic, no changes needed, LDL fairly good considering his exercise is limited now due to his back pain  3. Essential hypertension -bp controlled with current routine, no changes  4. Anxiety state -ongoing, has been on valium and xanax for many many years, sees counselor at [place] and participates in groups there   5. Postnasal drip -ongoing, given instructions to manage this, cont nasonex, gargles  6. Pancreas cyst -f/u next summer with MRI  Labs/tests ordered:  No orders of the defined types were placed in this encounter.   Next appt:  11/24/2018   Princella Jaskiewicz L. Trivia Heffelfinger, D.O. [facility] [address], [place] [address] Cell Phone (Mon-Fri 8am-5pm):  [phone] On Call:  [phone] & follow prompts after 5pm & weekends Office Phone:  [phone] Office Fax:  [phone]

## 2018-09-16 NOTE — Patient Instructions (Signed)

## 2018-11-08 ENCOUNTER — Other Ambulatory Visit: Payer: Self-pay | Admitting: Internal Medicine

## 2018-11-08 DIAGNOSIS — F411 Generalized anxiety disorder: Secondary | ICD-10-CM

## 2018-11-23 ENCOUNTER — Ambulatory Visit: Payer: Self-pay

## 2018-11-24 ENCOUNTER — Ambulatory Visit: Payer: Self-pay

## 2018-11-26 ENCOUNTER — Ambulatory Visit (INDEPENDENT_AMBULATORY_CARE_PROVIDER_SITE_OTHER): Payer: Medicare Other

## 2018-11-26 VITALS — BP 138/68 | HR 71 | Temp 97.7°F | Ht 69.0 in | Wt 188.0 lb

## 2018-11-26 DIAGNOSIS — Z0000 Encounter for general adult medical examination without abnormal findings: Secondary | ICD-10-CM

## 2018-11-26 MED ORDER — TETANUS-DIPHTH-ACELL PERTUSSIS 5-2.5-18.5 LF-MCG/0.5 IM SUSP: 0.5000 mL | Freq: Once | INTRAMUSCULAR | 0 refills | Status: AC

## 2018-11-26 MED ORDER — ZOSTER VAC RECOMB ADJUVANTED 50 MCG/0.5ML IM SUSR: 0.5000 mL | Freq: Once | INTRAMUSCULAR | 1 refills | Status: AC

## 2018-11-26 NOTE — Progress Notes (Signed)
Subjective:   Alexander [name] is a 82 y.o. male who presents for Medicare Annual/Subsequent preventive examination.  Last AWV-11/20/2017    Objective:    Vitals: BP 138/68 (BP Location: Left Arm, Patient Position: Sitting)   Pulse 71   Temp 97.7 F (36.5 C) (Oral)   Ht 5\' 9"  (1.753 m)   Wt 188 lb (85.3 kg)   SpO2 98%   BMI 27.76 kg/m   Body mass index is 27.76 kg/m.  Advanced Directives 11/26/2018 05/06/2018 11/20/2017 05/25/2017 03/25/2017 10/07/2016 10/07/2016  Does Patient Have a Medical Advance Directive? No No Yes No No No No  Type of Advance Directive - - - - - - -  Does patient want to make changes to medical advance directive? No - Patient declined - Yes (ED - Information included in AVS) - - - -  Copy of Healthcare Power of Attorney in Chart? - - - - - - -  Would patient like information on creating a medical advance directive? - No - Patient declined - Yes (MAU/Ambulatory/Procedural Areas - Information given) - - Yes - Educational materials given  Pre-existing out of facility DNR order (yellow form or pink MOST form) - - - - - - -    Tobacco Social History   Tobacco Use  Smoking Status Former Smoker  . Packs/day: 1.00  . Years: 22.00  . Pack years: 22.00  . Types: Cigarettes  . Start date: 08/22/1956  . Last attempt to quit: 12/08/1977  . Years since quitting: 40.9  Smokeless Tobacco Never Used  Tobacco Comment   significant second-hand smoke exposure at work     Counseling given: Not Answered Comment: significant second-hand smoke exposure at work   Clinical Intake:  Pre-visit preparation completed: No  Pain : No/denies pain     Diabetes: No  How often do you need to have someone help you when you read instructions, pamphlets, or other written materials from your doctor or pharmacy?: 1 - Never What is the last grade level you completed in school?: college  Interpreter Needed?: No  Information entered by :: Tyson Dense, RN  Past Medical  History:  Diagnosis Date  . Allergic rhinitis due to pollen   . Anginal pain ([place])    once in a while, none recent  . Anxiety   . Anxiety disorder   . Asthma    exercise induced  . Atrial fibrillation (HCC)    hx of  . Basal cell carcinoma of skin of lip   . Clotting disorder ([place])   . Coronary atherosclerosis of native coronary artery    with CABG 3/12  -MAZE procedure   . Diastasis recti 05/07/2016  . Difficulty urinating   . Diverticulosis of colon (without mention of hemorrhage)    mild  . Dysuria   . Edema   . Essential hypertension, benign   . Heart attack ([place]) 2012  . Helicobacter pylori (H. pylori)   . 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 History of anal fissures   . History of cholelithiasis   . Hyperlipidemia   . Hypertrophy of prostate with urinary obstruction and other lower urinary tract symptoms (LUTS)   . Impotence of organic origin   . Internal hemorrhoids without mention of complication   . Jaundice age 61  . Long term (current) use of anticoagulants   . Neutropenia ([place]) 10/07/2016  . Nonspecific elevation of levels of transaminase or lactic acid dehydrogenase (LDH)   . Other  voice and resonance disorders    after surgery, took breathing tube out and damaged something  . Pneumonia   . Pulmonary embolism ([place]) 2012   s/p heart surgery  . Pulmonary embolism ([place])    3/12  . Rheumatic fever age 10  . Skin cancer    Hx of squamous cell x2  . Sleep apnea    no CPAP  . Unspecified constipation    Past Surgical History:  Procedure Laterality Date  . CARDIAC CATHETERIZATION    . CHOLECYSTECTOMY N/A 11/18/2013   Procedure: LAPAROSCOPIC CHOLECYSTECTOMY With IOC;  Surgeon: Odis Hollingshead, MD;  Location: [facility];  Service: General;  Laterality: N/A;  . CORONARY ARTERY BYPASS GRAFT  2012   Median sternotomy, extracorporeal circulation, coronary  . HERNIA REPAIR  2010   bilateral  . HYDROCELE EXCISION     dr [name]   . MOHS SURGERY      squamus x2; BCC fo bridge of nose June 2017.  Marland Kitchen SCROTUM EXPLORATION  1990's   multiple  . SPERMATOCELECTOMY  02/1999   Right, Dr.[name]  . SUPERFICIAL SKIN CYSTECTOMY (l) HIP Right 1997   dr [name]  . VASECTOMY  04/1992   Family History  Problem Relation Age of Onset  . Bone cancer Mother   . Diabetes Mother   . Colon cancer Neg Hx   . Esophageal cancer Neg Hx   . Rectal cancer Neg Hx   . Stomach cancer Neg Hx   . Lung disease Neg Hx    Social History   Socioeconomic History  . Marital status: Married    Spouse name: Not on file  . Number of children: 1  . Years of education: Not on file  . Highest education level: Not on file  Occupational History  . Occupation: Presenter, broadcasting: [employer]  . Financial resource strain: Not hard at all  . Food insecurity:    Worry: Never true    Inability: Never true  . Transportation needs:    Medical: No    Non-medical: No  Tobacco Use  . Smoking status: Former Smoker    Packs/day: 1.00    Years: 22.00    Pack years: 22.00    Types: Cigarettes    Start date: 08/22/1956    Last attempt to quit: 12/08/1977    Years since quitting: 40.9  . Smokeless tobacco: Never Used  . Tobacco comment: significant second-hand smoke exposure at work  Substance and Sexual Activity  . Alcohol use: Yes    Alcohol/week: 0.0 standard drinks    Comment: former heavy alcohol use, rare use  . Drug use: No  . Sexual activity: Yes    Partners: Female  Lifestyle  . Physical activity:    Days per week: 6 days    Minutes per session: 30 min  . Stress: To some extent  Relationships  . Social connections:    Talks on phone: More than three times a week    Gets together: More than three times a week    Attends religious service: Never    Active member of club or organization: Yes    Attends meetings of clubs or organizations: 1 to 4 times per year    Relationship status: Married  Other Topics Concern  . Not on file    Social History Narrative   Daily caffeine       [name]   [name]   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 No pets currently. No hot tub or bird exposure.    Married wife Alex    Outpatient Encounter Medications as of 11/26/2018  Medication Sig  . albuterol (PROVENTIL HFA;VENTOLIN HFA) 108 (90 BASE) MCG/ACT inhaler Inhale 2 puffs into the lungs every 6 (six) hours as needed for wheezing.  Marland Kitchen ALPRAZolam (XANAX) 0.5 MG tablet TAKE 1 TO 2 TABLETS BY MOUTH DAILY AS NEEDED FOR ANXIETY  . aspirin 81 MG tablet Take 81 mg by mouth 3 (three) times a week. Mondays, wednesdays and fridays  . cholecalciferol (VITAMIN D) 1000 UNITS tablet Take 1,000 Units by mouth daily.  . diazepam (VALIUM) 5 MG tablet TAKE 1 TO 2 TABLETS BY MOUTH DAILY AS NEEDED TO HELP REST  . omeprazole (PRILOSEC) 20 MG capsule Take 1 capsule (20 mg total) by mouth daily as needed (heartburn).  . rosuvastatin (CRESTOR) 10 MG tablet Take 1 tablet (10 mg total) by mouth daily.  . Tdap (BOOSTRIX) 5-2.5-18.5 LF-MCG/0.5 injection Inject 0.5 mLs into the muscle once for 1 dose.  Marland Kitchen Zoster Vaccine Adjuvanted [facility]) injection Inject 0.5 mLs into the muscle once for 1 dose.  . [DISCONTINUED] Tdap (BOOSTRIX) 5-2.5-18.5 LF-MCG/0.5 injection Inject 0.5 mLs into the muscle once.  . [DISCONTINUED] Zoster Vaccine Adjuvanted [facility]) injection Inject 0.5 mLs into the muscle once.   No facility-administered encounter medications on file as of 11/26/2018.     Activities of Daily Living In your present state of health, do  you have any difficulty performing the following activities: 11/26/2018  Hearing? N  Vision? N  Difficulty concentrating or making decisions? N  Walking or climbing stairs? N  Dressing or bathing? N  Doing errands, shopping? N  Preparing Food and eating ? N  Using the Toilet? N  In the past six months, have you accidently leaked urine? N  Do you have problems with loss of bowel control? N  Managing your Medications? N  Managing your Finances? N  Housekeeping or managing your Housekeeping? N  Some recent data might be hidden    Patient Care Team: [name], DO as PCP - General (Geriatric Medicine) Javier Glazier, MD as Consulting Physician (Pulmonary Disease) Danella Sensing, MD as Consulting Physician (Dermatology) Jessy Oto, MD as Consulting Physician (Orthopedic Surgery) Irine Seal, MD as Attending Physician (Urology) Jackolyn Confer, MD as Consulting Physician (General Surgery) Melrose Nakayama, MD as Consulting Physician (Cardiothoracic Surgery) Rozetta Nunnery, MD as Consulting Physician (Otolaryngology) Griselda Miner, MD as Consulting Physician (Dermatology)   Assessment:   This is a routine wellness examination for Cooper.  Exercise Activities and Dietary recommendations Current Exercise Habits: Home exercise routine, Type of exercise: Other - see comments(martial arts), Time (Minutes): 30, Frequency (Times/Week): 6, Weekly Exercise (Minutes/Week): 180, Intensity: Mild, Exercise limited by: None identified  Goals    . Maintaining physical activity     Starting 10/07/16, I will maintain my normal exercise routine daily/weekly.     . Patient Stated     Would like to stay physically fit, maintain relationships, and take care of himself and others.       Fall Risk Fall Risk  11/26/2018 09/16/2018 05/06/2018 01/01/2018 12/24/2017  Falls in the past year? 0 No No No No  Number falls in past yr: 0 - - - -  Comment - - - - -  Injury with Fall?  0 - - - -  Follow up - - - - -   Is the patient's home free of loose throw rugs in walkways, pet beds, electrical cords, etc?   yes      Grab bars in the bathroom? yes      Handrails on the stairs?   yes      Adequate lighting?   yes  Depression Screen PHQ 2/9 Scores 11/26/2018 09/16/2018 05/06/2018 01/01/2018  PHQ - 2 Score 0 0 0 0    Cognitive Function MMSE - Mini Mental State Exam 11/26/2018 11/20/2017 10/07/2016 11/06/2015 10/18/2014  Not completed: - - - (No Data) -  Orientation to time 5 5 5 5 5   Orientation to Place 5 5 5 5 5   Registration 3 3 3 3 3   Attention/ Calculation 5 5 5 5 5   Recall 3 2 3 3 2   Language- name 2 objects 2 2 2 2 2   Language- repeat 1 1 1 1 1   Language- follow 3 step command 3 3 3 2 3   Language- read & follow direction 1 1 1 1 1   Write a sentence 1 1 1 1 1   Copy design 1 1 1 1 1   Total score 30 29 30 29 29         Immunization History  Administered Date(s) Administered  . Influenza Split 10/13/2013  . Influenza,inj,Quad PF,6+ Mos 10/07/2016  . Influenza-Unspecified 10/02/2014, 09/08/2015, 09/07/2018  . Pneumococcal Conjugate-13 10/16/2010, 10/02/2014  . Td 12/15/2007    Qualifies for Shingles Vaccine? Yes, educated and ordered to pharmacy  Screening Tests Health Maintenance  Topic Date Due  . PNA vac Low Risk Adult (2 of 2 - PPSV23) 10/03/2015  . TETANUS/TDAP  12/14/2017  . INFLUENZA VACCINE  Completed   Cancer Screenings: Lung: Low Dose CT Chest recommended if Age 49-80 years, 30 pack-year currently smoking OR have quit w/in 15years. Patient does not qualify. Colorectal: up to date  Additional Screenings:  Hepatitis C Screening:declined TDAP due: ordered to pharmacy Pneumovax LHT:DSKAJ to check with [facility] to make sure this is the one that is due      Plan:    I have personally reviewed and addressed the Medicare Annual Wellness questionnaire and have noted the following in the patient's chart:  A. Medical and social history B. Use  of alcohol, tobacco or illicit drugs  C. Current medications and supplements D. Functional ability and status E.  Nutritional status F.  Physical activity G. Advance directives H. List of other physicians I.  Hospitalizations, surgeries, and ER visits in previous 12 months J.  [place] to include hearing, vision, cognitive, depression L. Referrals and appointments - none  In addition, I have reviewed and discussed with patient certain preventive protocols, quality metrics, and best practice recommendations. A written personalized care plan for preventive services as well as general preventive health recommendations were provided to patient.  See attached scanned questionnaire for additional information.   Signed,   Tyson Dense, RN Nurse Health Advisor  Patient concerns: None

## 2018-11-26 NOTE — Patient Instructions (Addendum)
Mr. Alexander [name] , Thank you for taking time to come for your Medicare Wellness Visit. I appreciate your ongoing commitment to your health goals. Please review the following plan we discussed and let me know if I can assist you in the future.   Screening recommendations/referrals: Colonoscopy excluded, over age 80 Recommended yearly ophthalmology/optometry visit for glaucoma screening and checkup Recommended yearly dental visit for hygiene and checkup  Vaccinations: Influenza vaccine up to date Pneumococcal vaccine due. Please check if you got Prevnar (13) or Pneumovax (23)  Tdap vaccine due, ordered to pharmacy Shingles vaccine due, ordered to pharmacy  Advanced directives: please bring [place] a copy once this is completed and notarized  Conditions/risks identified: none  Next appointment: Dr. Mariea Clonts 01/27/2019 @ 1:30pm            Medicare Wellness Visit 11/28/2019 @ 12:45pm  Preventive Care 27 Years and Older, Male Preventive care refers to lifestyle choices and visits with your health care provider that can promote health and wellness. What does preventive care include?  A yearly physical exam. This is also called an annual well check.  Dental exams once or twice a year.  Routine eye exams. Ask your health care provider how often you should have your eyes checked.  Personal lifestyle choices, including:  Daily care of your teeth and gums.  Regular physical activity.  Eating a healthy diet.  Avoiding tobacco and drug use.  Limiting alcohol use.  Practicing safe sex.  Taking low doses of aspirin every day.  Taking vitamin and mineral supplements as recommended by your health care provider. What happens during an annual well check? The services and screenings done by your health care provider during your annual well check will depend on your age, overall health, lifestyle risk factors, and family history of disease. Counseling  Your health care provider may ask you questions  about your:  Alcohol use.  Tobacco use.  Drug use.  Emotional well-being.  Home and relationship well-being.  Sexual activity.  Eating habits.  History of falls.  Memory and ability to understand (cognition).  Work and work Statistician. Screening  You may have the following tests or measurements:  Height, weight, and BMI.  Blood pressure.  Lipid and cholesterol levels. These may be checked every 5 years, or more frequently if you are over 78 years old.  Skin check.  Lung cancer screening. You may have this screening every year starting at age 11 if you have a 30-pack-year history of smoking and currently smoke or have quit within the past 15 years.  Fecal occult blood test (FOBT) of the stool. You may have this test every year starting at age 53.  Flexible sigmoidoscopy or colonoscopy. You may have a sigmoidoscopy every 5 years or a colonoscopy every 10 years starting at age 44.  Prostate cancer screening. Recommendations will vary depending on your family history and other risks.  Hepatitis C blood test.  Hepatitis B blood test.  Sexually transmitted disease (STD) testing.  Diabetes screening. This is done by checking your blood sugar (glucose) after you have not eaten for a while (fasting). You may have this done every 1-3 years.  Abdominal aortic aneurysm (AAA) screening. You may need this if you are a current or former smoker.  Osteoporosis. You may be screened starting at age 6 if you are at high risk. Talk with your health care provider about your test results, treatment options, and if necessary, the need for more tests. Vaccines  Your health care  provider may recommend certain vaccines, such as:  Influenza vaccine. This is recommended every year.  Tetanus, diphtheria, and acellular pertussis (Tdap, Td) vaccine. You may need a Td booster every 10 years.  Zoster vaccine. You may need this after age 55.  Pneumococcal 13-valent conjugate (PCV13)  vaccine. One dose is recommended after age 29.  Pneumococcal polysaccharide (PPSV23) vaccine. One dose is recommended after age 88. Talk to your health care provider about which screenings and vaccines you need and how often you need them. This information is not intended to replace advice given to you by your health care provider. Make sure you discuss any questions you have with your health care provider. Document Released: 12/21/2015 Document Revised: 08/13/2016 Document Reviewed: 09/25/2015 Elsevier Interactive Patient Education  2017 [place] Prevention in the Home Falls can cause injuries. They can happen to people of all ages. There are many things you can do to make your home safe and to help prevent falls. What can I do on the outside of my home?  Regularly fix the edges of walkways and driveways and fix any cracks.  Remove anything that might make you trip as you walk through a door, such as a raised step or threshold.  Trim any bushes or trees on the path to your home.  Use bright outdoor lighting.  Clear any walking paths of anything that might make someone trip, such as rocks or tools.  Regularly check to see if handrails are loose or broken. Make sure that both sides of any steps have handrails.  Any raised decks and porches should have guardrails on the edges.  Have any leaves, snow, or ice cleared regularly.  Use sand or salt on walking paths during winter.  Clean up any spills in your garage right away. This includes oil or grease spills. What can I do in the bathroom?  Use night lights.  Install grab bars by the toilet and in the tub and shower. Do not use towel bars as grab bars.  Use non-skid mats or decals in the tub or shower.  If you need to sit down in the shower, use a plastic, non-slip stool.  Keep the floor dry. Clean up any water that spills on the floor as soon as it happens.  Remove soap buildup in the tub or shower  regularly.  Attach bath mats securely with double-sided non-slip rug tape.  Do not have throw rugs and other things on the floor that can make you trip. What can I do in the bedroom?  Use night lights.  Make sure that you have a light by your bed that is easy to reach.  Do not use any sheets or blankets that are too big for your bed. They should not hang down onto the floor.  Have a firm chair that has side arms. You can use this for support while you get dressed.  Do not have throw rugs and other things on the floor that can make you trip. What can I do in the kitchen?  Clean up any spills right away.  Avoid walking on wet floors.  Keep items that you use a lot in easy-to-reach places.  If you need to reach something above you, use a strong step stool that has a grab bar.  Keep electrical cords out of the way.  Do not use floor polish or wax that makes floors slippery. If you must use wax, use non-skid floor wax.  Do not have throw  rugs and other things on the floor that can make you trip. What can I do with my stairs?  Do not leave any items on the stairs.  Make sure that there are handrails on both sides of the stairs and use them. Fix handrails that are broken or loose. Make sure that handrails are as long as the stairways.  Check any carpeting to make sure that it is firmly attached to the stairs. Fix any carpet that is loose or worn.  Avoid having throw rugs at the top or bottom of the stairs. If you do have throw rugs, attach them to the floor with carpet tape.  Make sure that you have a light switch at the top of the stairs and the bottom of the stairs. If you do not have them, ask someone to add them for you. What else can I do to help prevent falls?  Wear shoes that:  Do not have high heels.  Have rubber bottoms.  Are comfortable and fit you well.  Are closed at the toe. Do not wear sandals.  If you use a stepladder:  Make sure that it is fully  opened. Do not climb a closed stepladder.  Make sure that both sides of the stepladder are locked into place.  Ask someone to hold it for you, if possible.  Clearly mark and make sure that you can see:  Any grab bars or handrails.  First and last steps.  Where the edge of each step is.  Use tools that help you move around (mobility aids) if they are needed. These include:  Canes.  Walkers.  Scooters.  Crutches.  Turn on the lights when you go into a dark area. Replace any light bulbs as soon as they burn out.  Set up your furniture so you have a clear path. Avoid moving your furniture around.  If any of your floors are uneven, fix them.  If there are any pets around you, be aware of where they are.  Review your medicines with your doctor. Some medicines can make you feel dizzy. This can increase your chance of falling. Ask your doctor what other things that you can do to help prevent falls. This information is not intended to replace advice given to you by your health care provider. Make sure you discuss any questions you have with your health care provider.Document Released: 09/20/2009 Document Revised: 05/01/2016 Document Reviewed: 12/29/2014 Elsevier Interactive Patient Education  2017 [employer].

## 2018-11-29 DIAGNOSIS — L245 Irritant contact dermatitis due to other chemical products: Secondary | ICD-10-CM | POA: Diagnosis not present

## 2018-12-13 ENCOUNTER — Ambulatory Visit (INDEPENDENT_AMBULATORY_CARE_PROVIDER_SITE_OTHER): Payer: Medicare Other | Admitting: Internal Medicine

## 2018-12-13 ENCOUNTER — Encounter: Payer: Self-pay | Admitting: Internal Medicine

## 2018-12-13 VITALS — BP 118/70 | HR 61 | Temp 97.5°F | Ht 69.0 in | Wt 187.4 lb

## 2018-12-13 DIAGNOSIS — B370 Candidal stomatitis: Secondary | ICD-10-CM

## 2018-12-13 NOTE — Patient Instructions (Signed)
Use the nystatin swish and spit 3-4 times per day until the thrush resolves. If you use nasonex in the future, be careful to rinse your mouth after each use.

## 2018-12-13 NOTE — Progress Notes (Signed)
Location:  [facility] clinic Provider: Caleigha Zale L. Mariea Clonts, D.O., C.M.D.  Goals of Care:  Advanced Directives 12/13/2018  Does Patient Have a Medical Advance Directive? Yes  Type of Advance Directive -  Does patient want to make changes to medical advance directive? No - Patient declined  Copy of [place] in Chart? -  Would patient like information on creating a medical advance directive? -  Pre-existing out of facility DNR order (yellow form or pink MOST form) -   Chief Complaint  Patient presents with  . Acute Visit    thrush on tongue, symptoms for 1 week    HPI: Patient is a 83 y.o. male seen today for an acute visit for thrush on the tongue for a week.  He has upper dentures.  He got new ones and developed thrush after he was using nasonex for stuffiness and headache.  Tongue was white as snow.  He could not find the nystatin I gave him last May.  Found it after he made his appt.  Had been using salt water which was starting to work.  Not completely clear.  New dentures were also too close to tongue.     Past Medical History:  Diagnosis Date  . Allergic rhinitis due to pollen   . Anginal pain ([place])    once in a while, none recent  . Anxiety   . Anxiety disorder   . Asthma    exercise induced  . Atrial fibrillation (HCC)    hx of  . Basal cell carcinoma of skin of lip   . Clotting disorder ([place])   . Coronary atherosclerosis of native coronary artery    with CABG 3/12  -MAZE procedure   . Diastasis recti 05/07/2016  . Difficulty urinating   . Diverticulosis of colon (without mention of hemorrhage)    mild  . Dysuria   . Edema   . Essential hypertension, benign   . Heart attack ([place]) 2012  . Helicobacter pylori (H. pylori)   . 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 History of anal fissures   . History of cholelithiasis   . Hyperlipidemia   . Hypertrophy of prostate with urinary obstruction and other lower urinary tract symptoms (LUTS)   .  Impotence of organic origin   . Internal hemorrhoids without mention of complication   . Jaundice age 76  . Long term (current) use of anticoagulants   . Neutropenia ([place]) 10/07/2016  . Nonspecific elevation of levels of transaminase or lactic acid dehydrogenase (LDH)   . Other voice and resonance disorders    after surgery, took breathing tube out and damaged something  . Pneumonia   . Pulmonary embolism ([place]) 2012   s/p heart surgery  . Pulmonary embolism ([place])    3/12  . Rheumatic fever age 36  . Skin cancer    Hx of squamous cell x2  . Sleep apnea    no CPAP  . Unspecified constipation     Past Surgical History:  Procedure Laterality Date  . CARDIAC CATHETERIZATION    . CHOLECYSTECTOMY N/A 11/18/2013   Procedure: LAPAROSCOPIC CHOLECYSTECTOMY With IOC;  Surgeon: Odis Hollingshead, MD;  Location: [facility];  Service: General;  Laterality: N/A;  . CORONARY ARTERY BYPASS GRAFT  2012   Median sternotomy, extracorporeal circulation, coronary  . HERNIA REPAIR  2010   bilateral  . HYDROCELE EXCISION     dr [name]   . MOHS SURGERY  squamus x2; BCC fo bridge of nose June 2017.  Marland Kitchen SCROTUM EXPLORATION  1990's   multiple  . SPERMATOCELECTOMY  02/1999   Right, Dr.[name]  . SUPERFICIAL SKIN CYSTECTOMY (l) HIP Right 1997   dr [name]  . VASECTOMY  04/1992    Allergies  Allergen Reactions  . Heparin     Blood clots   . Penicillin G Shortness Of Breath  . Levaquin [Levofloxacin In D5w] Other (See Comments)    Patient felt jittery and nervous with insomnia on the medication.  . Aspirin     Breathing complications, can tolerate baby asa  . Codeine     Some is ok  . Sucralfate     Could not sleep  . Zetia [Ezetimibe]     jittery  . Zocor [Simvastatin]     Liver enzymes increased  . Plavix [Clopidogrel Bisulfate] Palpitations    Outpatient Encounter Medications as of 12/13/2018  Medication Sig  . albuterol (PROVENTIL HFA;VENTOLIN HFA) 108 (90 BASE) MCG/ACT inhaler Inhale 2  puffs into the lungs every 6 (six) hours as needed for wheezing.  Marland Kitchen ALPRAZolam (XANAX) 0.5 MG tablet TAKE 1 TO 2 TABLETS BY MOUTH DAILY AS NEEDED FOR ANXIETY  . aspirin 81 MG tablet Take 81 mg by mouth 3 (three) times a week. Mondays, wednesdays and fridays  . cholecalciferol (VITAMIN D) 1000 UNITS tablet Take 1,000 Units by mouth daily.  . diazepam (VALIUM) 5 MG tablet TAKE 1 TO 2 TABLETS BY MOUTH DAILY AS NEEDED TO HELP REST  . omeprazole (PRILOSEC) 20 MG capsule Take 1 capsule (20 mg total) by mouth daily as needed (heartburn).  . rosuvastatin (CRESTOR) 10 MG tablet Take 1 tablet (10 mg total) by mouth daily.   No facility-administered encounter medications on file as of 12/13/2018.     Review of Systems:  Review of Systems  Constitutional: Negative for chills, fever and malaise/fatigue.  HENT: Negative for congestion.        Nasal congestion better  Respiratory: Negative for cough and shortness of breath.   Cardiovascular: Negative for chest pain.  Gastrointestinal:       Thrush  Musculoskeletal: Negative for falls.  Skin: Negative for itching and rash.  Psychiatric/Behavioral: Negative for depression.    Health Maintenance  Topic Date Due  . PNA vac Low Risk Adult (2 of 2 - PPSV23) 10/03/2015  . TETANUS/TDAP  12/14/2017  . INFLUENZA VACCINE  Completed    Physical Exam: Vitals:   12/13/18 1547  BP: 118/70  Pulse: 61  Temp: (!) 97.5 F (36.4 C)  TempSrc: Oral  SpO2: 94%  Weight: 187 lb 6.4 oz (85 kg)  Height: 5\' 9"  (1.753 m)   Body mass index is 27.67 kg/m. Physical Exam Constitutional:      General: He is not in acute distress.    Appearance: Normal appearance. He is normal weight. He is not ill-appearing.     Comments: Wearing mask to protect himself from infection b/c his wife had vitrectomy surgery  HENT:     Head: Normocephalic and atraumatic.     Mouth/Throat:     Comments: Tongue with white patch on left posterior quarter of tongue with some erythematous  patches more anteriorly; none visible on gums, only tongue Musculoskeletal: Normal range of motion.  Neurological:     Mental Status: He is alert and oriented to person, place, and time.  Psychiatric:        Mood and Affect: Mood normal.  Behavior: Behavior normal.     Labs reviewed: Basic Metabolic Panel: Recent Labs    09/10/18 0824  NA 142  K 4.2  CL 105  CO2 29  GLUCOSE 97  BUN 13  CREATININE 0.98  CALCIUM 9.0   Liver Function Tests: Recent Labs    09/10/18 0824  AST 20  ALT 16  BILITOT 1.3*  PROT 6.5   No results for input(s): LIPASE, AMYLASE in the last 8760 hours. No results for input(s): AMMONIA in the last 8760 hours. CBC: Recent Labs    09/10/18 0824  WBC 5.8  NEUTROABS 3,265  HGB 15.5  HCT 45.6  MCV 91.9  PLT 237   Lipid Panel: Recent Labs    09/10/18 0824  CHOL 157  HDL 53  LDLCALC 89  TRIG 65  CHOLHDL 3.0   Lab Results  Component Value Date   HGBA1C 5.8 (H) 10/07/2016    Procedures since last visit: No results found.  Assessment/Plan 1. Oral thrush -due to nasonex use -encouraged rinsing mouth after use which he reports doing already -begin nystatin swish and spit 3-4 times per day until thrush resolves  Labs/tests ordered:  No orders of the defined types were placed in this encounter.  Next appt:  01/27/2019  Alexander [name] L. Yishai Rehfeld, D.O. [facility] [address], [place] [address] Cell Phone (Mon-Fri 8am-5pm):  [phone] On Call:  [phone] & follow prompts after 5pm & weekends Office Phone:  [phone] Office Fax:  [phone]

## 2019-01-17 ENCOUNTER — Other Ambulatory Visit: Payer: Self-pay | Admitting: Internal Medicine

## 2019-01-17 DIAGNOSIS — F411 Generalized anxiety disorder: Secondary | ICD-10-CM

## 2019-01-17 NOTE — Telephone Encounter (Signed)
Last filled 11/08/2018  [place] Database verified and compliance confirmed

## 2019-01-27 ENCOUNTER — Ambulatory Visit (INDEPENDENT_AMBULATORY_CARE_PROVIDER_SITE_OTHER): Payer: Medicare Other | Admitting: Internal Medicine

## 2019-01-27 ENCOUNTER — Encounter: Payer: Self-pay | Admitting: Internal Medicine

## 2019-01-27 VITALS — BP 120/60 | HR 58 | Temp 98.1°F | Ht 69.0 in | Wt 192.0 lb

## 2019-01-27 DIAGNOSIS — I10 Essential (primary) hypertension: Secondary | ICD-10-CM

## 2019-01-27 DIAGNOSIS — I25810 Atherosclerosis of coronary artery bypass graft(s) without angina pectoris: Secondary | ICD-10-CM | POA: Diagnosis not present

## 2019-01-27 DIAGNOSIS — Z0000 Encounter for general adult medical examination without abnormal findings: Secondary | ICD-10-CM

## 2019-01-27 DIAGNOSIS — M48061 Spinal stenosis, lumbar region without neurogenic claudication: Secondary | ICD-10-CM | POA: Diagnosis not present

## 2019-01-27 DIAGNOSIS — F411 Generalized anxiety disorder: Secondary | ICD-10-CM

## 2019-01-27 DIAGNOSIS — K862 Cyst of pancreas: Secondary | ICD-10-CM

## 2019-01-27 NOTE — Patient Instructions (Addendum)
We have record that you've had the prevnar-13.  We do not have documentation of your pneumovax pneumonia vaccine.  Please find out if you did have this at the [place].

## 2019-01-27 NOTE — Progress Notes (Signed)
Provider:  [name]. Mariea Clonts, D.O., C.M.D. Location:   [place]   Place of Service:   office  Previous PCP: [name], DO Patient Care Team: [name], DO as PCP - General (Geriatric Medicine) Javier Glazier, MD as Consulting Physician (Pulmonary Disease) Danella Sensing, MD as Consulting Physician (Dermatology) Jessy Oto, MD as Consulting Physician (Orthopedic Surgery) Irine Seal, MD as Attending Physician (Urology) Jackolyn Confer, MD as Consulting Physician (General Surgery) Melrose Nakayama, MD as Consulting Physician (Cardiothoracic Surgery) Rozetta Nunnery, MD as Consulting Physician (Otolaryngology) Griselda Miner, MD as Consulting Physician (Dermatology)  Extended Emergency Contact Information Primary Emergency Contact: AlexanderBlanchard(Alex) Address: [place], [place] [address] [place] Phone: [phone] Work Phone: [phone] Relation: Spouse  Goals of Care: Advanced Directive information Advanced Directives 12/13/2018  Does Patient Have a Medical Advance Directive? Yes  Type of Advance Directive -  Does patient want to make changes to medical advance directive? No - Patient declined  Copy of Alexander [name] in Chart? -  Would patient like information on creating a medical advance directive? -  Pre-existing out of facility DNR order (yellow form or pink MOST form) -      Chief Complaint  Patient presents with  . Annual Exam    cpe    HPI: Patient is a 83 y.o. male seen today for an annual physical exam. Had AWV in December.   He is wearing a mask here to prevent getting the flu and preventing infecting his wife who has RA.  Sees Dr. Tamala Julian from cardiology and EKGs are done there.  Things are going pretty well.    His thrush resolved with salt water.  I had recommended nystatin for him, but he didn't even need it and it resolved.  Says he takes it a day at a time.   Still does his stretches and some light weights.    [facility]  Some disability from his low back pain.  Started in 1984 and has been chronic since.    Sees Dr. Jacqulyn Bath here and sees Dr. [name] from urology.  He is starting to have some pain on his lateral feet near the arch.  He wonders if it's his shoes.   He is going to [employer] to have his feet checked to see about supports.  Past Medical History:  Diagnosis Date  . Allergic rhinitis due to pollen   . Anginal pain ([place])    once in a while, none recent  . Anxiety   . Anxiety disorder   . Asthma    exercise induced  . Atrial fibrillation (HCC)    hx of  . Basal cell carcinoma of skin of lip   . Clotting disorder ([place])   . Coronary atherosclerosis of native coronary artery    with CABG 3/12  -MAZE procedure   . Diastasis recti 05/07/2016  . Difficulty urinating   . Diverticulosis of colon (without mention of hemorrhage)    mild  . Dysuria   . Edema   . Essential hypertension, benign   . Heart attack ([place]) 2012  . Helicobacter pylori (H. pylori)   . 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 History of anal fissures   . History of cholelithiasis   . Hyperlipidemia   . Hypertrophy of prostate with urinary obstruction and other lower urinary tract symptoms (LUTS)   .  Impotence of organic origin   . Internal hemorrhoids without mention of complication   . Jaundice age 61  . Long term (current) use of anticoagulants   . Neutropenia ([place]) 10/07/2016  . Nonspecific elevation of levels of transaminase or lactic acid dehydrogenase (LDH)   . Other voice and resonance disorders    after surgery, took breathing tube out and damaged something  . Pneumonia   . Pulmonary embolism ([place]) 2012   s/p heart surgery  . Pulmonary embolism ([place])    3/12  . Rheumatic fever age 28  . Skin cancer    Hx of squamous cell x2  . Sleep apnea    no CPAP  . Unspecified constipation    Past Surgical History:    Procedure Laterality Date  . CARDIAC CATHETERIZATION    . CHOLECYSTECTOMY N/A 11/18/2013   Procedure: LAPAROSCOPIC CHOLECYSTECTOMY With IOC;  Surgeon: Odis Hollingshead, MD;  Location: [facility];  Service: General;  Laterality: N/A;  . CORONARY ARTERY BYPASS GRAFT  2012   Median sternotomy, extracorporeal circulation, coronary  . HERNIA REPAIR  2010   bilateral  . HYDROCELE EXCISION     dr [name]   . MOHS SURGERY     squamus x2; BCC fo bridge of nose June 2017.  Marland Kitchen SCROTUM EXPLORATION  1990's   multiple  . SPERMATOCELECTOMY  02/1999   Right, Dr.[name]  . SUPERFICIAL SKIN CYSTECTOMY (l) HIP Right 1997   dr [name]  . VASECTOMY  04/1992    reports that he quit smoking about 41 years ago. His smoking use included cigarettes. He started smoking about 62 years ago. He has a 22.00 pack-year smoking history. He has never used smokeless tobacco. He reports current alcohol use. He reports that he does not use drugs.  Functional Status Survey:    Family History  Problem Relation Age of Onset  . Bone cancer Mother   . Diabetes Mother   . Colon cancer Neg Hx   . Esophageal cancer Neg Hx   . Rectal cancer Neg Hx   . Stomach cancer Neg Hx   . Lung disease Neg Hx     Health Maintenance  Topic Date Due  . PNA vac Low Risk Adult (2 of 2 - PPSV23) 10/03/2015  . TETANUS/TDAP  12/14/2017  . INFLUENZA VACCINE  Completed    Allergies  Allergen Reactions  . Heparin     Blood clots   . Penicillin G Shortness Of Breath  . Levaquin [Levofloxacin In D5w] Other (See Comments)    Patient felt jittery and nervous with insomnia on the medication.  . Aspirin     Breathing complications, can tolerate baby asa  . Codeine     Some is ok  . Sucralfate     Could not sleep  . Zetia [Ezetimibe]     jittery  . Zocor [Simvastatin]     Liver enzymes increased  . Plavix [Clopidogrel Bisulfate] Palpitations    Outpatient Encounter Medications as of 01/27/2019  Medication Sig  . albuterol (PROVENTIL  HFA;VENTOLIN HFA) 108 (90 BASE) MCG/ACT inhaler Inhale 2 puffs into the lungs every 6 (six) hours as needed for wheezing.  Marland Kitchen ALPRAZolam (XANAX) 0.5 MG tablet TAKE 1 TO 2 TABLETS BY MOUTH EVERY DAY AS NEEDED FOR ANXIETY  . aspirin 81 MG tablet Take 81 mg by mouth 3 (three) times a week. Mondays, wednesdays and fridays  . cholecalciferol (VITAMIN D) 1000 UNITS tablet Take 1,000 Units by mouth daily.  . diazepam (VALIUM)  5 MG tablet TAKE 1 TO 2 TABLETS BY MOUTH EVERY DAY AS NEEDED FOR REST  . omeprazole (PRILOSEC) 20 MG capsule Take 1 capsule (20 mg total) by mouth daily as needed (heartburn).  . rosuvastatin (CRESTOR) 10 MG tablet Take 1 tablet (10 mg total) by mouth daily.   No facility-administered encounter medications on file as of 01/27/2019.     Review of Systems  Constitutional: Negative for chills, fever and malaise/fatigue.  HENT: Negative for hearing loss.   Eyes: Negative for blurred vision.       Glasses  Respiratory: Negative for cough and shortness of breath.   Cardiovascular: Negative for chest pain, palpitations and leg swelling.  Gastrointestinal: Negative for abdominal pain, blood in stool, constipation, diarrhea, melena, nausea and vomiting.  Genitourinary: Negative for dysuria.  Musculoskeletal: Positive for back pain, joint pain and neck pain. Negative for falls.  Skin: Negative for itching and rash.  Neurological: Negative for loss of consciousness and headaches.  Endo/Heme/Allergies: Does not bruise/bleed easily.  Psychiatric/Behavioral: Negative for depression and memory loss. The patient is nervous/anxious. The patient does not have insomnia.        PTSD--veteran, service-connected    Vitals:   01/27/19 1344  BP: 120/60  Pulse: (!) 58  Temp: 98.1 F (36.7 C)  TempSrc: Oral  SpO2: 97%  Weight: 192 lb (87.1 kg)  Height: 5\' 9"  (1.753 m)   Body mass index is 28.35 kg/m. Physical Exam Vitals signs reviewed.  Constitutional:      General: He is not in acute  distress.    Appearance: Normal appearance. He is not toxic-appearing.  HENT:     Head: Normocephalic and atraumatic.     Right Ear: Tympanic membrane, ear canal and external ear normal.     Left Ear: Tympanic membrane, ear canal and external ear normal.     Nose: Nose normal.     Mouth/Throat:     Mouth: Mucous membranes are dry.     Pharynx: Oropharynx is clear.     Comments: No residual white patches Eyes:     Extraocular Movements: Extraocular movements intact.     Conjunctiva/sclera: Conjunctivae normal.     Pupils: Pupils are equal, round, and reactive to light.  Neck:     Musculoskeletal: Neck supple. No neck rigidity.     Comments: Some crepitus Cardiovascular:     Rate and Rhythm: Normal rate and regular rhythm.     Pulses: Normal pulses.     Heart sounds: Normal heart sounds.  Pulmonary:     Effort: Pulmonary effort is normal. No respiratory distress.     Breath sounds: Normal breath sounds. No wheezing or rhonchi.  Abdominal:     General: Abdomen is flat. Bowel sounds are normal. There is no distension.     Palpations: There is no mass.     Tenderness: There is no abdominal tenderness. There is no guarding or rebound.  Musculoskeletal: Normal range of motion.        General: Tenderness present.     Comments: Tenderness of posterior arch region of feet  Skin:    General: Skin is warm and dry.     Capillary Refill: Capillary refill takes less than 2 seconds.  Neurological:     General: No focal deficit present.     Mental Status: He is alert and oriented to person, place, and time.     Cranial Nerves: No cranial nerve deficit.     Sensory: No sensory deficit.  Motor: No weakness.     Coordination: Coordination normal.     Gait: Gait normal.     Deep Tendon Reflexes: Reflexes normal.  Psychiatric:        Mood and Affect: Mood normal.     Comments: Always a bit anxious and jittery     Labs reviewed: Basic Metabolic Panel: Recent Labs    09/10/18 0824    NA 142  K 4.2  CL 105  CO2 29  GLUCOSE 97  BUN 13  CREATININE 0.98  CALCIUM 9.0   Liver Function Tests: Recent Labs    09/10/18 0824  AST 20  ALT 16  BILITOT 1.3*  PROT 6.5   No results for input(s): LIPASE, AMYLASE in the last 8760 hours. No results for input(s): AMMONIA in the last 8760 hours. CBC: Recent Labs    09/10/18 0824  WBC 5.8  NEUTROABS 3,265  HGB 15.5  HCT 45.6  MCV 91.9  PLT 237   Cardiac Enzymes: No results for input(s): CKTOTAL, CKMB, CKMBINDEX, TROPONINI in the last 8760 hours. BNP: Invalid input(s): POCBNP Lab Results  Component Value Date   HGBA1C 5.8 (H) 10/07/2016   Lab Results  Component Value Date   TSH 3.340 10/17/2014   No results found for: VITAMINB12 No results found for: FOLATE No results found for: IRON, TIBC, FERRITIN   Assessment/Plan 1. Annual physical exam -performed today and benign, he is to check with the [facility] about whether he had pneumovax--he has had prevnar  2. Coronary artery disease involving coronary bypass graft without angina pectoris, unspecified whether native or transplanted heart -cont secondary MI prevention with diet, exercise, bp, lipid control  3. Degenerative lumbar spinal stenosis -ongoing chronic pain, but does not warrant regular medication use, does stretching and regular workout routine to stay limber  4. Essential hypertension -bp controlled, not on meds  5. Anxiety state -ongoing, has some PTSD and is treated by [facility] psychiatry and counselor; on both valium and xanax with valium being more for sleep and xanax for anxiety  6. Pancreas cyst -for f/u MRI to reassess at 3 yr mark as recommended by prior radiologist--he will get this done at [facility] and bring me a copy  Labs/tests ordered:  No orders of the defined types were placed in this encounter.  4 mos med mgt  Tashon Capp L. Hawken Bielby, D.O. [facility] [address], [place] [address] Cell Phone  (Mon-Fri 8am-5pm):  [phone] On Call:  [phone] & follow prompts after 5pm & weekends Office Phone:  [phone] Office Fax:  [phone]

## 2019-02-01 ENCOUNTER — Encounter: Payer: Self-pay | Admitting: Internal Medicine

## 2019-02-04 NOTE — Addendum Note (Signed)
Addended by: [name] on: 02/04/2019 04:36 PM   Modules accepted: Level of Service

## 2019-03-16 ENCOUNTER — Other Ambulatory Visit: Payer: Self-pay | Admitting: Internal Medicine

## 2019-03-16 DIAGNOSIS — F411 Generalized anxiety disorder: Secondary | ICD-10-CM

## 2019-03-16 NOTE — Telephone Encounter (Signed)
I attempted to call in RX x 2, line busy at pharmacy

## 2019-03-16 NOTE — Telephone Encounter (Signed)
[place] Database verified and compliance confirmed   Both medications last filled 01/17/2019

## 2019-04-03 ENCOUNTER — Encounter: Payer: Self-pay | Admitting: Internal Medicine

## 2019-05-27 ENCOUNTER — Other Ambulatory Visit: Payer: Self-pay | Admitting: Internal Medicine

## 2019-05-27 DIAGNOSIS — F411 Generalized anxiety disorder: Secondary | ICD-10-CM

## 2019-06-02 ENCOUNTER — Telehealth: Payer: Self-pay | Admitting: Interventional Cardiology

## 2019-06-02 NOTE — Progress Notes (Signed)
Virtual Visit via Telephone Note   This visit type was conducted due to national recommendations for restrictions regarding the COVID-19 Pandemic (e.g. social distancing) in an effort to limit this patient's exposure and mitigate transmission in our community.  Due to his co-morbid illnesses, this patient is at least at moderate risk for complications without adequate follow up.  This format is felt to be most appropriate for this patient at this time.  The patient did not have access to video technology/had technical difficulties with video requiring transitioning to audio format only (telephone).  All issues noted in this document were discussed and addressed.  No physical exam could be performed with this format.  Please refer to the patient's chart for his  consent to telehealth for [facility].   Date:  06/03/2019   ID:  Alexander [name], DOB 10/20/1936, MRN [id number]  Patient Location: Home Provider Location: Home  PCP:  [name], DO  Cardiologist:  No primary care provider on file.  Electrophysiologist:  None   Evaluation Performed:  Follow-Up Visit  Chief Complaint:  CAD  History of Present Illness:    Alexander [name] is a 83 y.o. male with CAD/CABG and cardiovascular risk factors. He denies angina. He has had prior coronary bypass graftingIn 2012 with LIMA to LAD, SVG to distal right, and SVG to the first diagonal. He has relative statin intolerance although he is able to take very low dose Crestor. Other medical problems include hypertension and asthma.  He is doing well.  Just lost his brother COVID 79 infection, he was 1 years old.  In the interval since last visit, no significant cardiac issues have arisen.  He is remaining active.  He does not have angina.  He has not had syncope.  Medications are being taken as prescribed.  He still feels there is some fatigue related to statin therapy but is compliant.  The patient does not have symptoms concerning for COVID-19  infection (fever, chills, cough, or new shortness of breath).    Past Medical History:  Diagnosis Date  . Allergic rhinitis due to pollen   . Anginal pain ([place])    once in a while, none recent  . Anxiety   . Anxiety disorder   . Asthma    exercise induced  . Atrial fibrillation (HCC)    hx of  . Basal cell carcinoma of skin of lip   . Clotting disorder ([place])   . Coronary atherosclerosis of native coronary artery    with CABG 3/12  -MAZE procedure   . Diastasis recti 05/07/2016  . Difficulty urinating   . Diverticulosis of colon (without mention of hemorrhage)    mild  . Dysuria   . Edema   . Essential hypertension, benign   . Heart attack ([place]) 2012  . Helicobacter pylori (H. pylori)   . 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 History of anal fissures   . History of cholelithiasis   . Hyperlipidemia   . Hypertrophy of prostate with urinary obstruction and other lower urinary tract symptoms (LUTS)   . Impotence of organic origin   . Internal hemorrhoids without mention of complication   . Jaundice age 76  . Long term (current) use of anticoagulants   . Neutropenia ([place]) 10/07/2016  . Nonspecific elevation of levels of transaminase or lactic acid dehydrogenase (LDH)   . Other voice and resonance disorders    after surgery, took breathing tube out and damaged something  .  Pneumonia   . Pulmonary embolism ([place]) 2012   s/p heart surgery  . Pulmonary embolism ([place])    3/12  . Rheumatic fever age 55  . Skin cancer    Hx of squamous cell x2  . Sleep apnea    no CPAP  . Unspecified constipation    Past Surgical History:  Procedure Laterality Date  . CARDIAC CATHETERIZATION    . CHOLECYSTECTOMY N/A 11/18/2013   Procedure: LAPAROSCOPIC CHOLECYSTECTOMY With IOC;  Surgeon: Odis Hollingshead, MD;  Location: [facility];  Service: General;  Laterality: N/A;  . CORONARY ARTERY BYPASS GRAFT  2012   Median sternotomy, extracorporeal circulation, coronary  . HERNIA REPAIR   2010   bilateral  . HYDROCELE EXCISION     dr [name]   . MOHS SURGERY     squamus x2; BCC fo bridge of nose June 2017.  Marland Kitchen SCROTUM EXPLORATION  1990's   multiple  . SPERMATOCELECTOMY  02/1999   Right, Dr.[name]  . SUPERFICIAL SKIN CYSTECTOMY (l) HIP Right 1997   dr [name]  . VASECTOMY  04/1992     No outpatient medications have been marked as taking for the 06/03/19 encounter (Appointment) with Belva Crome, MD.     Allergies:   Heparin, Penicillin g, Levaquin [levofloxacin in d5w], Aspirin, Codeine, Sucralfate, Zetia [ezetimibe], Zocor [simvastatin], and Plavix [clopidogrel bisulfate]   Social History   Tobacco Use  . Smoking status: Former Smoker    Packs/day: 1.00    Years: 22.00    Pack years: 22.00    Types: Cigarettes    Start date: 08/22/1956    Quit date: 12/08/1977    Years since quitting: 41.5  . Smokeless tobacco: Never Used  . Tobacco comment: significant second-hand smoke exposure at work  Substance Use Topics  . Alcohol use: Yes    Alcohol/week: 0.0 standard drinks    Comment: former heavy alcohol use, rare use  . Drug use: No     Family Hx: The patient's family history includes Bone cancer in his mother; Diabetes in his mother. There is no history of Colon cancer, Esophageal cancer, Rectal cancer, Stomach cancer, or Lung disease.  ROS:   Please see the history of present illness.    Grieving the death of his brother. All other systems reviewed and are negative.   Prior CV studies:   The following studies were reviewed today:  No new cardiac imaging or functional data.  Labs/Other Tests and Data Reviewed:    EKG:  No ECG reviewed.  Recent Labs: 09/10/2018: ALT 16; BUN 13; Creat 0.98; Hemoglobin 15.5; Platelets 237; Potassium 4.2; Sodium 142   Recent Lipid Panel Lab Results  Component Value Date/Time   CHOL 157 09/10/2018 08:24 AM   CHOL 168 05/02/2016 08:26 AM   TRIG 65 09/10/2018 08:24 AM   HDL 53 09/10/2018 08:24 AM   HDL 56 05/02/2016  08:26 AM   CHOLHDL 3.0 09/10/2018 08:24 AM   LDLCALC 89 09/10/2018 08:24 AM    Wt Readings from Last 3 Encounters:  01/27/19 192 lb (87.1 kg)  12/13/18 187 lb 6.4 oz (85 kg)  11/26/18 188 lb (85.3 kg)     Objective:    Vital Signs:  There were no vitals taken for this visit.   VITAL SIGNS:  reviewed  ASSESSMENT & PLAN:    1. Coronary artery disease involving coronary bypass graft of native heart without angina pectoris   2. Other hyperlipidemia   3. Essential hypertension   4. Educated  About Covid-19 Virus Infection    PLAN: 1. We discussed secondary prevention. 2. He understands target LDL was less than 70. 3. He understands target blood pressure is 130/80 mmHg. 4. Social distancing, masking, and handwashing is well understood especially in light of the recent loss of his brother due to COVID-19.  Overall education and awareness concerning primary/secondary risk prevention was discussed in detail: LDL less than 70, hemoglobin A1c less than 7, blood pressure target less than 130/80 mmHg, >150 minutes of moderate aerobic activity per week, avoidance of smoking, weight control (via diet and exercise), and continued surveillance/management of/for obstructive sleep apnea.   COVID-19 Education: The signs and symptoms of COVID-19 were discussed with the patient and how to seek care for testing (follow up with PCP or arrange E-visit).  The importance of social distancing was discussed today.  Time:   Today, I have spent 15 minutes with the patient with telehealth technology discussing the above problems.     Medication Adjustments/Labs and Tests Ordered: Current medicines are reviewed at length with the patient today.  Concerns regarding medicines are outlined above.   Tests Ordered: No orders of the defined types were placed in this encounter.   Medication Changes: No orders of the defined types were placed in this encounter.   Follow Up:  Virtual Visit in 1 year(s)   Signed, Sinclair Grooms, MD  06/03/2019 9:32 AM    [facility]

## 2019-06-02 NOTE — Telephone Encounter (Signed)
New Message ° ° ° ° °CONSENT FOR TELE-HEALTH VISIT - PLEASE REVIEW TO PATIENT ° °I hereby voluntarily request, consent and authorize [facility] and its employed or contracted physicians, physician assistants, nurse practitioners or other licensed health care professionals (the Practitioner), to provide me with telemedicine health care services (the "Services") as deemed necessary by the treating Practitioner. I acknowledge and consent to receive the Services by the Practitioner via telemedicine. I understand that the telemedicine visit will involve communicating with the Practitioner through live audiovisual communication technology and the disclosure of certain medical information by electronic transmission. I acknowledge that I have been given the opportunity to request an in-person assessment or other available alternative prior to the telemedicine visit and am voluntarily participating in the telemedicine visit. ° °I understand that I have the right to withhold or withdraw my consent to the use of telemedicine in the course of my care at any time, without affecting my right to future care or treatment, and that the Practitioner or I may terminate the ° °telemedicine visit at any time. I understand that I have the right to inspect all information obtained and/or recorded in the course of the telemedicine visit and may receive copies of available information for a reasonable fee. I understand that some of the potential risks of receiving the Services via telemedicine include: ° °Delay or interruption in medical evaluation due to technological equipment failure or disruption; ° °Information transmitted may not be sufficient (e.g. poor resolution of images) to allow for appropriate medical decision making by the Practitioner; and/or ° °In rare instances, security protocols could fail, causing a breach of personal health information. ° °Furthermore, I acknowledge that it is my responsibility to provide  information about my medical history, conditions and care that is complete and accurate to the best of my ability. I acknowledge that Practitioner's advice, recommendations, and/or decision may be based on factors not within their control, such as incomplete or inaccurate data provided by me or distortions of diagnostic images or specimens that may result from electronic transmissions. I understand that the practice of medicine is not an exact science and that Practitioner makes no warranties or guarantees regarding treatment outcomes. I acknowledge that I will receive a copy of this consent concurrently upon execution via email to the email address I last provided but may also request a printed copy by calling the office of [facility]. ° °I understand that my insurance will be billed for this visit. ° °I have read or had this consent read to me. ° °I understand the contents of this consent, which adequately explains the benefits and risks of the Services being provided via telemedicine. ° °I have been provided ample opportunity to ask questions regarding this consent and the Services and have had my questions answered to my satisfaction. ° °I give my informed consent for the services to be provided through the use of telemedicine in my medical care ° °By participating in this telemedicine visit I agree to the above °YES °

## 2019-06-03 ENCOUNTER — Other Ambulatory Visit: Payer: Self-pay

## 2019-06-03 ENCOUNTER — Encounter: Payer: Self-pay | Admitting: Interventional Cardiology

## 2019-06-03 ENCOUNTER — Telehealth (INDEPENDENT_AMBULATORY_CARE_PROVIDER_SITE_OTHER): Payer: Medicare Other | Admitting: Interventional Cardiology

## 2019-06-03 VITALS — Ht 69.0 in | Wt 183.0 lb

## 2019-06-03 DIAGNOSIS — E7849 Other hyperlipidemia: Secondary | ICD-10-CM

## 2019-06-03 DIAGNOSIS — I10 Essential (primary) hypertension: Secondary | ICD-10-CM

## 2019-06-03 DIAGNOSIS — Z7189 Other specified counseling: Secondary | ICD-10-CM

## 2019-06-03 DIAGNOSIS — I25810 Atherosclerosis of coronary artery bypass graft(s) without angina pectoris: Secondary | ICD-10-CM

## 2019-06-03 NOTE — Patient Instructions (Signed)
Medication Instructions:   Your physician recommends that you continue on your current medications as directed. Please refer to the Current Medication list given to you today.   If you need a refill on your cardiac medications before your next appointment, please call your pharmacy.     Follow-Up: At [facility], you and your health needs are our priority.  As part of our continuing mission to provide you with exceptional heart care, we have created designated Provider Care Teams.  These Care Teams include your primary Cardiologist (physician) and Advanced Practice Providers (APPs -  Physician Assistants and Nurse Practitioners) who all work together to provide you with the care you need, when you need it.  Your physician wants you to follow-up in: [place] will receive a reminder letter in the mail two months in advance. If you don't receive a letter, please call our office to schedule the follow-up appointment.

## 2019-08-02 ENCOUNTER — Other Ambulatory Visit: Payer: Self-pay | Admitting: Family

## 2019-08-02 DIAGNOSIS — F411 Generalized anxiety disorder: Secondary | ICD-10-CM

## 2019-08-02 NOTE — Telephone Encounter (Signed)
Last filled 05/27/19

## 2019-08-04 ENCOUNTER — Encounter: Payer: Self-pay | Admitting: Internal Medicine

## 2019-08-04 ENCOUNTER — Ambulatory Visit (INDEPENDENT_AMBULATORY_CARE_PROVIDER_SITE_OTHER): Payer: Medicare Other | Admitting: Internal Medicine

## 2019-08-04 ENCOUNTER — Other Ambulatory Visit: Payer: Self-pay

## 2019-08-04 VITALS — BP 140/60 | HR 68 | Temp 97.7°F | Ht 69.0 in | Wt 184.0 lb

## 2019-08-04 DIAGNOSIS — M48061 Spinal stenosis, lumbar region without neurogenic claudication: Secondary | ICD-10-CM

## 2019-08-04 DIAGNOSIS — F411 Generalized anxiety disorder: Secondary | ICD-10-CM | POA: Diagnosis not present

## 2019-08-04 DIAGNOSIS — D692 Other nonthrombocytopenic purpura: Secondary | ICD-10-CM | POA: Diagnosis not present

## 2019-08-04 NOTE — Progress Notes (Signed)
Location:  [facility] clinic Provider: Ailin Rochford L. Mariea Clonts, D.O., C.M.D.  Goals of Care:  Advanced Directives 12/13/2018  Does Patient Have a Medical Advance Directive? Yes  Type of Advance Directive -  Does patient want to make changes to medical advance directive? No - Patient declined  Copy of [place] in Chart? -  Would patient like information on creating a medical advance directive? -  Pre-existing out of facility DNR order (yellow form or pink MOST form) -    Chief Complaint  Patient presents with  . Acute Visit    bruises    HPI: Patient is a 83 y.o. male seen today for an acute visit for little spots of bruising.  Has had one occasionally on his left upper arm like a nickel-sized.  Read it could be kidney, liver, chronic kidney failure or platelet problem.  Takes flonase which is a steroid.  Violent coughing, allergies, blood disorder.  Does take baby asa just three times weekly.  There is one on the right forearm.  Has had right inner thigh.    Dr. [name] occasionally sees a bit of blood in his urine on UA, but was not felt to be remarkable.  He admits to urinating more than he once did.  They discussed prostate surgery and side effects were not thrilling.    He's had a slight feeling of tiredness.  Had switched from a lot of aerobic exercise to a vigorous stretching routine.  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  He'd have to lie down afterwards in the heat.  Is walking 3-4 days a week, three miles.  He is getting the same effect he used to get with running after 1.5 miles.  He's having less back pain with this.    All labs looked great in October of 2019.   Wants to wait on flu shot until late sept due to fatigue.    Past Medical History:  Diagnosis Date  . Allergic rhinitis due to pollen   . Anginal pain ([place])    once in a while, none recent  . Anxiety   . Anxiety disorder   . Asthma    exercise induced  . Atrial  fibrillation (HCC)    hx of  . Basal cell carcinoma of skin of lip   . Clotting disorder ([place])   . Coronary atherosclerosis of native coronary artery    with CABG 3/12  -MAZE procedure   . Diastasis recti 05/07/2016  . Difficulty urinating   . Diverticulosis of colon (without mention of hemorrhage)    mild  . Dysuria   . Edema   . Essential hypertension, benign   . Heart attack ([place]) 2012  . Helicobacter pylori (H. pylori)   . 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 History of anal fissures   . History of cholelithiasis   . Hyperlipidemia   . Hypertrophy of prostate with urinary obstruction and other lower urinary tract symptoms (LUTS)   . Impotence of organic origin   . Internal hemorrhoids without mention of complication   . Jaundice age 74  . Long term (current) use of anticoagulants   . Neutropenia ([place]) 10/07/2016  . Nonspecific elevation of levels of transaminase or lactic acid dehydrogenase (LDH)   . Other voice and resonance disorders    after surgery, took breathing tube out and damaged something  . Pneumonia   . Pulmonary embolism ([place])  2012   s/p heart surgery  . Pulmonary embolism ([place])    3/12  . Rheumatic fever age 89  . Skin cancer    Hx of squamous cell x2  . Sleep apnea    no CPAP  . Unspecified constipation     Past Surgical History:  Procedure Laterality Date  . CARDIAC CATHETERIZATION    . CHOLECYSTECTOMY N/A 11/18/2013   Procedure: LAPAROSCOPIC CHOLECYSTECTOMY With IOC;  Surgeon: Odis Hollingshead, MD;  Location: [facility];  Service: General;  Laterality: N/A;  . CORONARY ARTERY BYPASS GRAFT  2012   Median sternotomy, extracorporeal circulation, coronary  . HERNIA REPAIR  2010   bilateral  . HYDROCELE EXCISION     dr [name]   . MOHS SURGERY     squamus x2; BCC fo bridge of nose June 2017.  Marland Kitchen SCROTUM EXPLORATION  1990's   multiple  . SPERMATOCELECTOMY  02/1999   Right, Dr.[name]  . SUPERFICIAL SKIN CYSTECTOMY (l) HIP Right 1997   dr  [name]  . VASECTOMY  04/1992    Allergies  Allergen Reactions  . Heparin     Blood clots   . Penicillin G Shortness Of Breath  . Levaquin [Levofloxacin In D5w] Other (See Comments)    Patient felt jittery and nervous with insomnia on the medication.  . Aspirin     Breathing complications, can tolerate baby asa  . Codeine     Some is ok  . Sucralfate     Could not sleep  . Zetia [Ezetimibe]     jittery  . Zocor [Simvastatin]     Liver enzymes increased  . Plavix [Clopidogrel Bisulfate] Palpitations    Outpatient Encounter Medications as of 08/04/2019  Medication Sig  . albuterol (PROVENTIL HFA;VENTOLIN HFA) 108 (90 BASE) MCG/ACT inhaler Inhale 2 puffs into the lungs every 6 (six) hours as needed for wheezing.  Marland Kitchen ALPRAZolam (XANAX) 0.5 MG tablet TAKE 1 TO 2 TABLETS BY MOUTH EVERY DAY AS NEEDED FOR ANXIETY  . aspirin 81 MG tablet Take 81 mg by mouth 3 (three) times a week. Mondays, wednesdays and fridays  . diazepam (VALIUM) 5 MG tablet TAKE 1-2 TABLET BY MOUTH EVERY DAY AS NEEDED FOR REST  . omeprazole (PRILOSEC) 20 MG capsule Take 1 capsule (20 mg total) by mouth daily as needed (heartburn).  . rosuvastatin (CRESTOR) 10 MG tablet Take 1 tablet (10 mg total) by mouth daily.   No facility-administered encounter medications on file as of 08/04/2019.     Review of Systems:  Review of Systems  Constitutional: Positive for malaise/fatigue. Negative for chills and fever.  HENT: Positive for congestion and hearing loss.   Eyes: Negative for blurred vision.  Respiratory: Negative for cough and shortness of breath.   Cardiovascular: Negative for chest pain, palpitations and leg swelling.  Gastrointestinal: Negative for abdominal pain, blood in stool, constipation, diarrhea and melena.  Genitourinary: Positive for hematuria. Negative for dysuria.  Musculoskeletal: Positive for back pain (improved lately--back to some walking). Negative for falls and joint pain.  Skin:       Small  bruises on arms, leg  Neurological: Negative for dizziness, tingling and loss of consciousness.  Endo/Heme/Allergies: Bruises/bleeds easily.  Psychiatric/Behavioral: Negative for depression and memory loss. The patient is nervous/anxious. The patient does not have insomnia.     Health Maintenance  Topic Date Due  . PNA vac Low Risk Adult (2 of 2 - PPSV23) 10/03/2015  . TETANUS/TDAP  12/14/2017  . INFLUENZA VACCINE  07/09/2019  Physical Exam: Vitals:   08/04/19 1451  BP: 140/60  Pulse: 68  Temp: 97.7 F (36.5 C)  TempSrc: Oral  SpO2: 97%  Weight: 184 lb (83.5 kg)  Height: 5\' 9"  (1.753 m)   Body mass index is 27.17 kg/m. Physical Exam Vitals signs reviewed.  Constitutional:      General: He is not in acute distress.    Appearance: Normal appearance. He is normal weight. He is not toxic-appearing.  HENT:     Head: Normocephalic and atraumatic.  Cardiovascular:     Rate and Rhythm: Normal rate and regular rhythm.  Pulmonary:     Effort: Pulmonary effort is normal.  Skin:    General: Skin is warm and dry.     Comments: Dime-sized now brown flat patch right forearm, similar one on right medial thigh (that began as purple)  Neurological:     General: No focal deficit present.     Mental Status: He is alert and oriented to person, place, and time.  Psychiatric:        Mood and Affect: Mood normal.     Labs reviewed: Basic Metabolic Panel: Recent Labs    09/10/18 0824  NA 142  K 4.2  CL 105  CO2 29  GLUCOSE 97  BUN 13  CREATININE 0.98  CALCIUM 9.0   Liver Function Tests: Recent Labs    09/10/18 0824  AST 20  ALT 16  BILITOT 1.3*  PROT 6.5   No results for input(s): LIPASE, AMYLASE in the last 8760 hours. No results for input(s): AMMONIA in the last 8760 hours. CBC: Recent Labs    09/10/18 0824  WBC 5.8  NEUTROABS 3,265  HGB 15.5  HCT 45.6  MCV 91.9  PLT 237   Lipid Panel: Recent Labs    09/10/18 0824  CHOL 157  HDL 53  LDLCALC 89   TRIG 65  CHOLHDL 3.0   Lab Results  Component Value Date   HGBA1C 5.8 (H) 10/07/2016    Assessment/Plan 1. Senile purpura ([place]) -educated on this issue--he had been reading and was hoping I'd say this is what his spots on his arms and legs were -due to baby asa and aging process  2. Anxiety state -he admits to having enough anxiety for a few people--an overabundance -continues on his benzos for this -followed at [facility] also for therapy  3. Degenerative lumbar spinal stenosis -uses rare norco a 1/4 tablet it sounds like, but lately this has been improved with a walking regimen back in his routine which I encouraged  Wanted to wait on flu shot--will call for appt on nurse schedule  Labs/tests ordered:  No new Next appt:  11/29/2019--keep scheduled appts  Georgie Eduardo L. Kosta Schnitzler, D.O. [facility] [address], [place] [address] Cell Phone (Mon-Fri 8am-5pm):  [phone] On Call:  [phone] & follow prompts after 5pm & weekends Office Phone:  [phone] Office Fax:  [phone]

## 2019-09-05 ENCOUNTER — Ambulatory Visit: Payer: Medicare Other

## 2019-09-06 ENCOUNTER — Other Ambulatory Visit: Payer: Self-pay

## 2019-09-06 ENCOUNTER — Ambulatory Visit (INDEPENDENT_AMBULATORY_CARE_PROVIDER_SITE_OTHER): Payer: Medicare Other

## 2019-09-06 DIAGNOSIS — Z23 Encounter for immunization: Secondary | ICD-10-CM

## 2019-10-01 ENCOUNTER — Emergency Department (HOSPITAL_BASED_OUTPATIENT_CLINIC_OR_DEPARTMENT_OTHER)
Admission: EM | Admit: 2019-10-01 | Discharge: 2019-10-01 | Disposition: A | Discharge status: Home / Self Care | Payer: Medicare Other | Attending: Emergency Medicine | Admitting: Emergency Medicine

## 2019-10-01 ENCOUNTER — Encounter (HOSPITAL_BASED_OUTPATIENT_CLINIC_OR_DEPARTMENT_OTHER): Payer: Self-pay | Admitting: Emergency Medicine

## 2019-10-01 ENCOUNTER — Emergency Department (HOSPITAL_BASED_OUTPATIENT_CLINIC_OR_DEPARTMENT_OTHER): Payer: Medicare Other

## 2019-10-01 ENCOUNTER — Other Ambulatory Visit: Payer: Self-pay

## 2019-10-01 DIAGNOSIS — W260XXA Contact with knife, initial encounter: Secondary | ICD-10-CM | POA: Diagnosis not present

## 2019-10-01 DIAGNOSIS — Z951 Presence of aortocoronary bypass graft: Secondary | ICD-10-CM | POA: Diagnosis not present

## 2019-10-01 DIAGNOSIS — Y929 Unspecified place or not applicable: Secondary | ICD-10-CM | POA: Diagnosis not present

## 2019-10-01 DIAGNOSIS — Y9389 Activity, other specified: Secondary | ICD-10-CM | POA: Insufficient documentation

## 2019-10-01 DIAGNOSIS — Z23 Encounter for immunization: Secondary | ICD-10-CM | POA: Diagnosis not present

## 2019-10-01 DIAGNOSIS — Z87891 Personal history of nicotine dependence: Secondary | ICD-10-CM | POA: Insufficient documentation

## 2019-10-01 DIAGNOSIS — I2510 Atherosclerotic heart disease of native coronary artery without angina pectoris: Secondary | ICD-10-CM | POA: Insufficient documentation

## 2019-10-01 DIAGNOSIS — I10 Essential (primary) hypertension: Secondary | ICD-10-CM | POA: Insufficient documentation

## 2019-10-01 DIAGNOSIS — S61217A Laceration without foreign body of left little finger without damage to nail, initial encounter: Secondary | ICD-10-CM | POA: Diagnosis not present

## 2019-10-01 DIAGNOSIS — Y999 Unspecified external cause status: Secondary | ICD-10-CM | POA: Diagnosis not present

## 2019-10-01 MED ORDER — LIDOCAINE HCL (PF) 1 % IJ SOLN
10.0000 mL | Freq: Once | INTRAMUSCULAR | Status: DC
  Filled 2019-10-01: qty 10

## 2019-10-01 MED ORDER — TETANUS-DIPHTH-ACELL PERTUSSIS 5-2.5-18.5 LF-MCG/0.5 IM SUSP
0.5000 mL | Freq: Once | INTRAMUSCULAR | Status: AC
  Administered 2019-10-01: 20:00:00 0.5 mL via INTRAMUSCULAR
  Filled 2019-10-01: qty 0.5

## 2019-10-01 NOTE — ED Notes (Signed)
About 1-2 inch lac to outer aspect of left 5th finger, pressure drsg applied

## 2019-10-01 NOTE — Discharge Instructions (Signed)
He was seen in the emergency department today after cutting her finger.  We updated your tetanus shot.  We closed the wound with Dermabond and cleaned it.  Should return to the emergency department if your finger becomes red, swollen, warm to touch.  The glue will come off on its own in several days.  Follow-up with your primary care physician as needed.

## 2019-10-01 NOTE — ED Provider Notes (Signed)
Emergency Department Provider Note   I have reviewed the triage vital signs and the nursing notes.   HISTORY  Chief Complaint Laceration   HPI Alexander [name] is a 83 y.o. male with past medical history reviewed below presents to the emergency department with left little finger laceration.  Patient was attempting to straighten a butter knife when it broke in half and the sharp edge slid down into his little finger.  He sustained a laceration to the distal, lateral edge of the finger without nail involvement.  He denies any numbness at the tip.  No additional cuts.  Last tetanus was 2009.  He does take 3 baby aspirin daily. Patient is right hand dominant.   Past Medical History:  Diagnosis Date   Allergic rhinitis due to pollen    Anginal pain ([place])    once in a while, none recent   Anxiety    Anxiety disorder    Asthma    exercise induced   Atrial fibrillation (HCC)    hx of   Basal cell carcinoma of skin of lip    Clotting disorder (HCC)    Coronary atherosclerosis of native coronary artery    with CABG 3/12  -MAZE procedure    Diastasis recti 05/07/2016   Difficulty urinating    Diverticulosis of colon (without mention of hemorrhage)    mild   Dysuria    Edema    Essential hypertension, benign    Heart attack ([place]) 0000000   Helicobacter pylori (H. 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[place])    3/12   History of anal fissures    History of cholelithiasis    Hyperlipidemia    Hypertrophy of prostate with urinary obstruction and other lower urinary tract symptoms (LUTS)    Impotence of organic origin    Internal hemorrhoids without mention of complication    Jaundice age 41   Johnni Wunschel term (current) use of anticoagulants    Neutropenia ([place]) 10/07/2016   Nonspecific elevation of levels of transaminase or lactic acid dehydrogenase (LDH)    Other voice and resonance disorders    after surgery, took breathing tube out and damaged  something   Pneumonia    Pulmonary embolism ([place]) 2012   s/p heart surgery   Pulmonary embolism ([place])    3/12   Rheumatic fever age 71   Skin cancer    Hx of squamous cell x2   Sleep apnea    no CPAP   Unspecified constipation     Patient Active Problem List   Diagnosis Date Noted   Senile purpura ([place]) 08/04/2019   Skin cancer of nose 03/25/2017   Pancreas cyst 06/04/2016   BCC (basal cell carcinoma of skin) 06/04/2016   Insomnia 05/07/2016   Diastasis recti 05/07/2016   Extrinsic asthma 03/14/2016   Toenail fungus [id number]   Helicobacter pylori gastritis 09/07/2014   Angina pectoris ([place]) 04/22/2014   CAD (coronary artery disease) of bypass graft 12/22/2013   Hyperlipidemia 12/22/2013   Hypertrophy of prostate 10/19/2013   Anxiety state 10/19/2013   Essential hypertension 04/13/2013   Spinal stenosis of lumbar region 09/10/1999   OCD (obsessive compulsive disorder) 03/11/1956    Past Surgical History:  Procedure Laterality Date   CARDIAC CATHETERIZATION     CHOLECYSTECTOMY N/A 11/18/2013   Procedure: LAPAROSCOPIC CHOLECYSTECTOMY With IOC;  Surgeon: Odis Hollingshead, MD;  Location: [facility];  Service: General;  Laterality: N/A;   CORONARY ARTERY BYPASS GRAFT  2012  Median sternotomy, extracorporeal circulation, coronary   HERNIA REPAIR  2010   bilateral   HYDROCELE EXCISION     dr [name]    [facility] SURGERY     squamus x2; [facility] fo bridge of nose June 2017.   SCROTUM EXPLORATION  1990's   multiple   SPERMATOCELECTOMY  02/1999   Right, Dr.[name]   SUPERFICIAL SKIN CYSTECTOMY (l) HIP Right 1997   dr [name]   VASECTOMY  04/1992    Allergies Heparin, Penicillin g, Levaquin [levofloxacin in d5w], Aspirin, Codeine, Sucralfate, Zetia [ezetimibe], Zocor [simvastatin], and Plavix [clopidogrel bisulfate]  Family History  Problem Relation Age of Onset   Bone cancer Mother    Diabetes Mother    Colon cancer Neg Hx    Esophageal  cancer Neg Hx    Rectal cancer Neg Hx    Stomach cancer Neg Hx    Lung disease Neg Hx     Social History Social History   Tobacco Use   Smoking status: Former Smoker    Packs/day: 1.00    Years: 22.00    Pack years: 22.00    Types: Cigarettes    Start date: 08/22/1956    Quit date: 12/08/1977    Years since quitting: 41.8   Smokeless tobacco: Never Used   Tobacco comment: significant second-hand smoke exposure at work  Substance Use Topics   Alcohol use: Yes    Alcohol/week: 0.0 standard drinks    Comment: former heavy alcohol use, rare use   Drug use: No    Review of Systems  Constitutional: No fever/chills Musculoskeletal: Negative for back pain. Left little finger pain.  Skin: Left little finger laceration.  Neurological: Negative for numbness.  10-point ROS otherwise negative.  ____________________________________________   PHYSICAL EXAM:  VITAL SIGNS: ED Triage Vitals  Enc Vitals Group     BP 10/01/19 1837 (!) 147/77     Pulse Rate 10/01/19 1837 66     Resp 10/01/19 1837 20     Temp 10/01/19 1837 98 F (36.7 C)     Temp Source 10/01/19 1837 Oral     SpO2 10/01/19 1837 98 %     Weight 10/01/19 1835 183 lb (83 kg)     Height 10/01/19 1835 5\' 9"  (1.753 m)   Constitutional: Alert and oriented. Well appearing and in no acute distress. Eyes: Conjunctivae are normal.  Head: Atraumatic. Nose: No congestion/rhinnorhea. Mouth/Throat: Mucous membranes are moist.   Neck: No stridor.   Cardiovascular: Normal rate, regular rhythm. Good peripheral circulation.  Respiratory: Normal respiratory effort.  Gastrointestinal: No distention.  Musculoskeletal: No gross deformities of extremities. Neurologic:  Normal speech and language. Normal sensation at the tip of the left 5th digit.  Skin:  Skin is warm and dry. 1.5 cm "V" chaped flap at the distal, lateral little finger without nailbed involvement.    ____________________________________________  RADIOLOGY  Dg Finger Little Left  Result Date: 10/01/2019 CLINICAL DATA:  Laceration, injury while straightening knife blade EXAM: LEFT LITTLE FINGER 2+V COMPARISON:  None. FINDINGS: Age indeterminate nondisplaced fracture involving an osteophyte at the dorsal base of the fifth distal phalanx. No other acute fracture or traumatic malalignment is seen. Soft tissue irregularity and laceration noted at tip digit. No subcutaneous gas or foreign body. IMPRESSION: Age indeterminate nondisplaced osteophyte fracture at the dorsal base of the fifth distal phalanx. Correlate for point tenderness. Soft tissue laceration of the tip of the fifth digit. No subcutaneous gas or foreign body. Electronically Signed   By: March Rummage  [facility] M.D.   On: 10/01/2019 20:40    ____________________________________________   PROCEDURES  Procedure(s) performed:   Marland KitchenMarland KitchenLaceration Repair  Date/Time: 10/01/2019 11:17 PM Performed by: Margette Fast, MD Authorized by: Margette Fast, MD   Consent:    Consent obtained:  Verbal   Consent given by:  Patient   Risks discussed:  Infection, need for additional repair, nerve damage, pain, poor cosmetic result, poor wound healing, retained foreign body and vascular damage   Alternatives discussed:  No treatment Anesthesia (see MAR for exact dosages):    Anesthesia method:  None Laceration details:    Location:  Finger   Finger location:  L small finger   Length (cm):  1   Depth (mm):  2 Repair type:    Repair type:  Simple Pre-procedure details:    Preparation:  Imaging obtained to evaluate for foreign bodies Exploration:    Hemostasis achieved with:  Direct pressure   Wound exploration: wound explored through full range of motion and entire depth of wound probed and visualized     Wound extent: no foreign bodies/material noted, no nerve damage noted, no tendon damage noted and no underlying fracture noted      Contaminated: no   Treatment:    Area cleansed with:  Betadine and saline   Amount of cleaning:  Extensive   Irrigation solution:  Sterile saline   Irrigation method:  Pressure wash Skin repair:    Repair method:  Tissue adhesive Approximation:    Approximation:  Close Post-procedure details:    Dressing:  Open (no dressing)   Patient tolerance of procedure:  Tolerated well, no immediate complications     ____________________________________________   INITIAL IMPRESSION / ASSESSMENT AND PLAN / ED COURSE  Pertinent labs & imaging results that were available during my care of the patient were reviewed by me and considered in my medical decision making (see chart for details).   Patient presents to the emergency department with laceration to the left nare.  Plan for plain film, update tetanus, likely repair with suture vs dermabond.   Imaging reviewed.  Patient does not have point tenderness over the area of possible fracture on x-ray.  Suspect that this may be more remote.  The wound was cleaned extensively.  The flap was well approximated and relatively superficial.  No significant separation on exam.  Applied tissue adhesive after discussion with the patient who is in agreement with this over suture.  Discussed return precautions and wound care plan at home.  ____________________________________________  FINAL CLINICAL IMPRESSION(S) / ED DIAGNOSES  Final diagnoses:  Laceration of left little finger without foreign body without damage to nail, initial encounter     MEDICATIONS GIVEN DURING THIS VISIT:  Medications  lidocaine (PF) (XYLOCAINE) 1 % injection 10 mL (has no administration in time range)  Tdap (BOOSTRIX) injection 0.5 mL (0.5 mLs Intramuscular Given 10/01/19 1952)    Note:  This document was prepared using Dragon voice recognition software and may include unintentional dictation errors.  Nanda Quinton, MD, [facility] Emergency Medicine    Creed Kail, [facility],  MD 10/01/19 [phone]

## 2019-10-01 NOTE — ED Notes (Addendum)
Voiced understanding of wound care instructions.

## 2019-10-01 NOTE — ED Triage Notes (Signed)
Laceration to pinky finger from a kitchen knife.

## 2019-10-05 ENCOUNTER — Ambulatory Visit (INDEPENDENT_AMBULATORY_CARE_PROVIDER_SITE_OTHER): Payer: Medicare Other | Admitting: Family

## 2019-10-05 ENCOUNTER — Other Ambulatory Visit: Payer: Self-pay

## 2019-10-05 ENCOUNTER — Encounter: Payer: Self-pay | Admitting: Family

## 2019-10-05 VITALS — BP 120/72 | HR 67 | Temp 98.6°F | Ht 69.0 in | Wt 188.8 lb

## 2019-10-05 DIAGNOSIS — S61217D Laceration without foreign body of left little finger without damage to nail, subsequent encounter: Secondary | ICD-10-CM

## 2019-10-05 NOTE — Patient Instructions (Signed)
1. Take extra strength tylenol 500 mg tablet one by mouth every 8 hours as needed for pain   2. Cleanse left 5 th finger with saline or peroxide daily,dry then apply band aid daily.  3. Notify provider's office if running any fever > 100.5,redness,swelling or drainage from laceration site.  4. May apply ice if finger is swollen.   5.Wrap finger with serine wrap to shower to void getting laceration area wet

## 2019-10-05 NOTE — Progress Notes (Signed)
Provider: Alexie Lanni FNP-C  [name], DO  Patient Care Team: [name], DO as PCP - General (Geriatric Medicine) Javier Glazier, MD as Consulting Physician (Pulmonary Disease) Danella Sensing, MD as Consulting Physician (Dermatology) Jessy Oto, MD as Consulting Physician (Orthopedic Surgery) Irine Seal, MD as Attending Physician (Urology) Jackolyn Confer, MD as Consulting Physician (General Surgery) Melrose Nakayama, MD as Consulting Physician (Cardiothoracic Surgery) Rozetta Nunnery, MD as Consulting Physician (Otolaryngology) Griselda Miner, MD as Consulting Physician (Dermatology)  Extended Emergency Contact Information Primary Emergency Contact: Bradshaw,[name] Address: [place], [place] [address] [place] Phone: [phone] Mobile Phone: [phone] Relation: Spouse  Code Status: Full Code  Goals of care: Advanced Directive information Advanced Directives 10/05/2019  Does Patient Have a Medical Advance Directive? No  Type of Advance Directive -  Does patient want to make changes to medical advance directive? -  Copy of [place] in Chart? -  Would patient like information on creating a medical advance directive? Yes (ED - Information included in AVS)  Pre-existing out of facility DNR order (yellow form or pink MOST form) -     Chief Complaint  Patient presents with   Follow-up    2-week followup for finger laceration, cut with butter knife   Immunizations    Declined Pneumovax    HPI:  Pt is a 83 y.o. male seen today at [facility] office for an acute visit for follow up ED visit for left 5th finger laceration.He was seen in the ED 10/01/2019 after sustaining a cut on his 5th finger.He states was trying to strengthen his butter knife when it broke into two and sharp edge slid down to his left 5 th finger.X-ray done showed no acute fracture had indeterminate  nondisplaced osteophyte fracture at the dorsal base of the fifth distal phalanx.His laceration was repaired in ED with derma bond.     Past Medical History:  Diagnosis Date   Allergic rhinitis due to pollen    Anginal pain ([place])    once in a while, none recent   Anxiety    Anxiety disorder    Asthma    exercise induced   Atrial fibrillation (HCC)    hx of   Basal cell carcinoma of skin of lip    Clotting disorder (HCC)    Coronary atherosclerosis of native coronary artery    with CABG 3/12  -MAZE procedure    Diastasis recti 05/07/2016   Difficulty urinating    Diverticulosis of colon (without mention of hemorrhage)    mild   Dysuria    Edema    Essential hypertension, benign    Heart attack ([place]) 0000000   Helicobacter pylori (H. 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[place])    3/12   History of anal fissures    History of cholelithiasis    Hyperlipidemia    Hypertrophy of prostate with urinary obstruction and other lower urinary tract symptoms (LUTS)    Impotence of organic origin    Internal hemorrhoids without mention of complication    Jaundice age 37   Long term (current) use of anticoagulants    Neutropenia ([place]) 10/07/2016   Nonspecific elevation of levels of transaminase or lactic acid dehydrogenase (LDH)    Other voice and resonance disorders    after surgery, took breathing tube out and damaged something   Pneumonia  Pulmonary embolism ([place]) 2012   s/p heart surgery   Pulmonary embolism ([place])    3/12   Rheumatic fever age 69   Skin cancer    Hx of squamous cell x2   Sleep apnea    no CPAP   Unspecified constipation    Past Surgical History:  Procedure Laterality Date   CARDIAC CATHETERIZATION     CHOLECYSTECTOMY N/A 11/18/2013   Procedure: LAPAROSCOPIC CHOLECYSTECTOMY With IOC;  Surgeon: Odis Hollingshead, MD;  Location: [facility];  Service: General;  Laterality: N/A;   CORONARY ARTERY BYPASS GRAFT  2012    Median sternotomy, extracorporeal circulation, coronary   HERNIA REPAIR  2010   bilateral   HYDROCELE EXCISION     dr [name]    [facility] SURGERY     squamus x2; [facility] fo bridge of nose June 2017.   SCROTUM EXPLORATION  1990's   multiple   SPERMATOCELECTOMY  02/1999   Right, Dr.[name]   SUPERFICIAL SKIN CYSTECTOMY (l) HIP Right 1997   dr [name]   VASECTOMY  04/1992    Allergies  Allergen Reactions   Heparin     Blood clots    Penicillin G Shortness Of Breath   Levaquin [Levofloxacin In D5w] Other (See Comments)    Patient felt jittery and nervous with insomnia on the medication.   Aspirin     Breathing complications, can tolerate baby asa   Codeine     Some is ok   Sucralfate     Could not sleep   Zetia [Ezetimibe]     jittery   Zocor [Simvastatin]     Liver enzymes increased   Plavix [Clopidogrel Bisulfate] Palpitations    Outpatient Encounter Medications as of 10/05/2019  Medication Sig   albuterol (PROVENTIL HFA;VENTOLIN HFA) 108 (90 BASE) MCG/ACT inhaler Inhale 2 puffs into the lungs every 6 (six) hours as needed for wheezing.   ALPRAZolam (XANAX) 0.5 MG tablet TAKE 1 TO 2 TABLETS BY MOUTH EVERY DAY AS NEEDED FOR ANXIETY   aspirin 81 MG tablet Take 81 mg by mouth 3 (three) times a week. Mondays, wednesdays and fridays   diazepam (VALIUM) 5 MG tablet Take 5 mg by mouth at bedtime.   omeprazole (PRILOSEC) 20 MG capsule Take 1 capsule (20 mg total) by mouth daily as needed (heartburn).   rosuvastatin (CRESTOR) 10 MG tablet Take 1 tablet (10 mg total) by mouth daily.   [DISCONTINUED] diazepam (VALIUM) 5 MG tablet TAKE 1-2 TABLET BY MOUTH EVERY DAY AS NEEDED FOR REST   No facility-administered encounter medications on file as of 10/05/2019.     Review of Systems  Constitutional: Negative for chills, fatigue and fever.  Respiratory: Negative for cough, chest tightness, shortness of breath and wheezing.   Cardiovascular: Negative for chest pain,  palpitations and leg swelling.  Musculoskeletal: Negative for arthralgias, gait problem and joint swelling.  Skin: Negative for color change, pallor and rash.       Left 5 th finger s/p laceration repair in ED    Neurological: Negative for weakness and numbness.       Has some slightly tingling on tip of the left little finger   Hematological: Does not bruise/bleed easily.    Immunization History  Administered Date(s) Administered   Influenza Split 10/13/2013   Influenza,inj,Quad PF,6+ Mos 10/07/2016, 09/06/2019   Influenza-Unspecified 10/02/2014, 09/08/2015, 09/07/2018   Pneumococcal Conjugate-13 10/16/2010, 10/02/2014   Td 12/15/2007   Tdap 10/01/2019   Pertinent  Health Maintenance Due  Topic Date  Due   PNA vac Low Risk Adult (2 of 2 - PPSV23) 10/04/2020 (Originally 10/03/2015)   INFLUENZA VACCINE  Completed   Fall Risk  01/27/2019 12/13/2018 11/26/2018 09/16/2018 05/06/2018  Falls in the past year? 0 0 0 No No  Number falls in past yr: 0 0 0 - -  Comment - - - - -  Injury with Fall? 0 0 0 - -  Follow up - - - - -    Vitals:   10/05/19 1036  BP: 120/72  Pulse: 67  Temp: 98.6 F (37 C)  TempSrc: Temporal  SpO2: 98%  Weight: 188 lb 12.8 oz (85.6 kg)  Height: 5\' 9"  (1.753 m)   Body mass index is 27.88 kg/m. Physical Exam Constitutional:      General: He is not in acute distress.    Appearance: He is overweight. He is not ill-appearing.  HENT:     Head: Normocephalic.     Mouth/Throat:     Mouth: Mucous membranes are moist.     Pharynx: Oropharynx is clear. No oropharyngeal exudate or posterior oropharyngeal erythema.  Eyes:     General: No scleral icterus.       Right eye: No discharge.        Left eye: No discharge.     Extraocular Movements: Extraocular movements intact.     Conjunctiva/sclera: Conjunctivae normal.     Pupils: Pupils are equal, round, and reactive to light.  Cardiovascular:     Rate and Rhythm: Normal rate and regular rhythm.      Pulses: Normal pulses.     Heart sounds: Normal heart sounds. No murmur. No friction rub. No gallop.   Pulmonary:     Effort: Pulmonary effort is normal. No respiratory distress.     Breath sounds: Normal breath sounds. No wheezing, rhonchi or rales.  Chest:     Chest wall: No tenderness.  Abdominal:     General: Bowel sounds are normal. There is no distension.     Palpations: Abdomen is soft. There is no mass.     Tenderness: There is no right CVA tenderness, left CVA tenderness, guarding or rebound.  Skin:    General: Skin is warm and dry.     Coloration: Skin is not pale.     Findings: No bruising, erythema or rash.     Comments: Left 5 th finger laceration site intact small amounts of old dry blood noted on band aid and finger.old dry blood on laceration cleansed with saline pat dried and band aid applied. No drainage or redness noted.slight tender to touch.  Neurological:     Mental Status: He is alert and oriented to person, place, and time.     Cranial Nerves: No cranial nerve deficit.     Sensory: No sensory deficit.     Motor: No weakness.     Coordination: Coordination normal.     Gait: Gait normal.  Psychiatric:        Mood and Affect: Mood normal.        Behavior: Behavior normal.        Thought Content: Thought content normal.        Judgment: Judgment normal.    Labs reviewed: No results for input(s): NA, K, CL, CO2, GLUCOSE, BUN, CREATININE, CALCIUM, MG, PHOS in the last 8760 hours. No results for input(s): AST, ALT, ALKPHOS, BILITOT, PROT, ALBUMIN in the last 8760 hours. No results for input(s): WBC, NEUTROABS, HGB, HCT, MCV, PLT in the last  8760 hours. Lab Results  Component Value Date   TSH 3.340 10/17/2014   Lab Results  Component Value Date   HGBA1C 5.8 (H) 10/07/2016   Lab Results  Component Value Date   CHOL 157 09/10/2018   HDL 53 09/10/2018   LDLCALC 89 09/10/2018   TRIG 65 09/10/2018   CHOLHDL 3.0 09/10/2018    Significant Diagnostic Results  in last 30 days:  Dg Finger Little Left  Result Date: 10/01/2019 CLINICAL DATA:  Laceration, injury while straightening knife blade EXAM: LEFT LITTLE FINGER 2+V COMPARISON:  None. FINDINGS: Age indeterminate nondisplaced fracture involving an osteophyte at the dorsal base of the fifth distal phalanx. No other acute fracture or traumatic malalignment is seen. Soft tissue irregularity and laceration noted at tip digit. No subcutaneous gas or foreign body. IMPRESSION: Age indeterminate nondisplaced osteophyte fracture at the dorsal base of the fifth distal phalanx. Correlate for point tenderness. Soft tissue laceration of the tip of the fifth digit. No subcutaneous gas or foreign body. Electronically Signed   By: Lovena Le M.D.   On: 10/01/2019 20:40    Assessment/Plan  Laceration of left little finger without foreign body without damage to nail, subsequent encounter Afebrile.status post repair with derma bond in the ED post accidentally injury with a knife.Progressive healing noted.old dry blood on laceration cleansed with saline pat dried and band aid applied. No signs of infections.Encouraged to Take extra strength tylenol 500 mg tablet one by mouth every 8 hours as needed for pain.Cleanse left 5 th finger with saline or peroxide daily,dry then apply band aid daily.May apply ice if finger is swollen.Wrap finger with serine wrap to shower to void getting laceration area wet.Notify provider's office if running any fever > 100.5,redness,swelling or drainage from laceration site. Will follow up in 2 weeks if needed for re-evaluation.  Family/ staff Communication: Reviewed plan of care with patient  Labs/tests ordered: None  Next appointment: 2 weeks for left 5 th finger laceration re-evaluation  Sandrea Hughs, NP

## 2019-10-06 ENCOUNTER — Ambulatory Visit (INDEPENDENT_AMBULATORY_CARE_PROVIDER_SITE_OTHER): Payer: Medicare Other | Admitting: Family

## 2019-10-06 ENCOUNTER — Telehealth: Payer: Self-pay | Admitting: *Deleted

## 2019-10-06 ENCOUNTER — Encounter: Payer: Self-pay | Admitting: Family

## 2019-10-06 ENCOUNTER — Other Ambulatory Visit: Payer: Self-pay

## 2019-10-06 ENCOUNTER — Ambulatory Visit: Payer: Medicare Other | Admitting: Internal Medicine

## 2019-10-06 DIAGNOSIS — S61217D Laceration without foreign body of left little finger without damage to nail, subsequent encounter: Secondary | ICD-10-CM

## 2019-10-06 NOTE — Progress Notes (Signed)
This service is provided via telemedicine  No vital signs collected/recorded due to the encounter was a telemedicine visit.   Location of patient (ex: home, work):  Home   Patient consents to a telephone visit:  Yes  Location of the provider (ex: office, home):  Office  Name of any referring provider:  Hollace Kinnier, [facility] of all persons participating in the telemedicine service and their role in the encounter:  Kamber Vignola NP, Ruthell Rummage CMA, and Hurley Cisco   Time spent on call:  Ruthell Rummage CMA, spent 7 minutes on phone with patient,    Provider: Adalina Dopson FNP-C  [name], DO  Patient Care Team: [name], DO as PCP - General (Geriatric Medicine) Javier Glazier, MD as Consulting Physician (Pulmonary Disease) Danella Sensing, MD as Consulting Physician (Dermatology) Jessy Oto, MD as Consulting Physician (Orthopedic Surgery) Irine Seal, MD as Attending Physician (Urology) Jackolyn Confer, MD as Consulting Physician (General Surgery) Melrose Nakayama, MD as Consulting Physician (Cardiothoracic Surgery) Rozetta Nunnery, MD as Consulting Physician (Otolaryngology) Griselda Miner, MD as Consulting Physician (Dermatology)  Extended Emergency Contact Information Primary Emergency Contact: Hodgdon,[name] Address: [address] [place], [place] [address] [place] Phone: [phone] Mobile Phone: [phone] Relation: Spouse  Code Status: Full Code  Goals of care: Advanced Directive information Advanced Directives 10/05/2019  Does Patient Have a Medical Advance Directive? No  Type of Advance Directive -  Does patient want to make changes to medical advance directive? -  Copy of [place] in Chart? -  Would patient like information on creating a medical advance directive? Yes (ED - Information included in AVS)  Pre-existing out of facility DNR order (yellow  form or pink MOST form) -     Chief Complaint  Patient presents with  . Acute Visit    Concerns regarding laceration on finger patient states when went to change the bandage and noticed some yellow discoloration patient states he changed bandaged and used some peroxide. Patient wants to discuss discoloration     HPI:  Pt is a 83 y.o. male seen today for an acute visit for evaluation of left finger laceration site.He states saw a yellow discoloration on the corner of the left little finger while he was changing the band aid.He denies any drainage from the laceration.Also denies any redness,worsening swelling or pain.He used ice on finger last night which help with the pain.No fever or chills.He was just concerned though thinks could be betadine color used to clean finger.     Past Medical History:  Diagnosis Date  . Allergic rhinitis due to pollen   . Anginal pain ([place])    once in a while, none recent  . Anxiety   . Anxiety disorder   . Asthma    exercise induced  . Atrial fibrillation (HCC)    hx of  . Basal cell carcinoma of skin of lip   . Clotting disorder ([place])   . Coronary atherosclerosis of native coronary artery    with CABG 3/12  -MAZE procedure   . Diastasis recti 05/07/2016  . Difficulty urinating   . Diverticulosis of colon (without mention of hemorrhage)    mild  . Dysuria   . Edema   . Essential hypertension, benign   . Heart attack ([place]) 2012  . Helicobacter pylori (H. pylori)   . 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 History of anal fissures   . History of cholelithiasis   . Hyperlipidemia   . Hypertrophy of prostate with urinary obstruction and other lower urinary tract symptoms (LUTS)   . Impotence of organic origin   . Internal hemorrhoids without mention of complication   . Jaundice age 52  . Long term (current) use of anticoagulants   . Neutropenia ([place]) 10/07/2016  . Nonspecific elevation of levels of transaminase or lactic acid  dehydrogenase (LDH)   . Other voice and resonance disorders    after surgery, took breathing tube out and damaged something  . Pneumonia   . Pulmonary embolism ([place] [facility]) 2012   s/p heart surgery  . Pulmonary embolism ([facility])    3/12  . Rheumatic fever age 48  . Skin cancer    Hx of squamous cell x2  . Sleep apnea    no CPAP  . Unspecified constipation    Past Surgical History:  Procedure Laterality Date  . CARDIAC CATHETERIZATION    . CHOLECYSTECTOMY N/A 11/18/2013   Procedure: LAPAROSCOPIC CHOLECYSTECTOMY With IOC;  Surgeon: Odis Hollingshead, MD;  Location: [facility];  Service: General;  Laterality: N/A;  . CORONARY ARTERY BYPASS GRAFT  2012   Median sternotomy, extracorporeal circulation, coronary  . HERNIA REPAIR  2010   bilateral  . HYDROCELE EXCISION     dr [name]   . MOHS SURGERY     squamus x2; BCC fo bridge of nose June 2017.  Marland Kitchen SCROTUM EXPLORATION  1990's   multiple  . SPERMATOCELECTOMY  02/1999   Right, Dr.[name]  . SUPERFICIAL SKIN CYSTECTOMY (l) HIP Right 1997   dr [name]  . VASECTOMY  04/1992    Allergies  Allergen Reactions  . Heparin     Blood clots   . Penicillin G Shortness Of Breath  . Levaquin [Levofloxacin In D5w] Other (See Comments)    Patient felt jittery and nervous with insomnia on the medication.  . Aspirin     Breathing complications, can tolerate baby asa  . Codeine     Some is ok  . Sucralfate     Could not sleep  . Zetia [Ezetimibe]     jittery  . Zocor [Simvastatin]     Liver enzymes increased  . Plavix [Clopidogrel Bisulfate] Palpitations    Outpatient Encounter Medications as of 10/06/2019  Medication Sig  . albuterol (PROVENTIL HFA;VENTOLIN HFA) 108 (90 BASE) MCG/ACT inhaler Inhale 2 puffs into the lungs every 6 (six) hours as needed for wheezing.  Marland Kitchen ALPRAZolam (XANAX) 0.5 MG tablet TAKE 1 TO 2 TABLETS BY MOUTH EVERY DAY AS NEEDED FOR ANXIETY  . aspirin 81 MG tablet Take 81 mg by mouth 3 (three) times a week. Mondays, wednesdays and  fridays  . diazepam (VALIUM) 5 MG tablet Take 5 mg by mouth at bedtime.  Marland Kitchen omeprazole (PRILOSEC) 20 MG capsule Take 1 capsule (20 mg total) by mouth daily as needed (heartburn).  . rosuvastatin (CRESTOR) 10 MG tablet Take 1 tablet (10 mg total) by mouth daily.   No facility-administered encounter medications on file as of 10/06/2019.     Review of Systems  Constitutional: Negative for appetite change, chills, fatigue and fever.  HENT: Negative for congestion, sinus pressure, sinus pain, sneezing, sore throat and tinnitus.   Eyes: Negative for discharge, redness and itching.  Respiratory: Negative for cough, chest tightness, shortness of breath and wheezing.   Cardiovascular: Negative for chest pain, palpitations and leg swelling.  Gastrointestinal: Negative for  abdominal distention, abdominal pain, constipation, diarrhea, nausea and vomiting.  Musculoskeletal: Negative for arthralgias and gait problem.  Skin: Negative for color change, pallor and rash.       Status post Left 5th finger laceration repair in the ED 10/01/2019   Neurological: Negative for dizziness, weakness, light-headedness, numbness and headaches.    Immunization History  Administered Date(s) Administered  . Influenza Split 10/13/2013  . Influenza,inj,Quad PF,6+ Mos 10/07/2016, 09/06/2019  . Influenza-Unspecified 10/02/2014, 09/08/2015, 09/07/2018  . Pneumococcal Conjugate-13 10/16/2010, 10/02/2014  . Td 12/15/2007  . Tdap 10/01/2019   Pertinent  Health Maintenance Due  Topic Date Due  . PNA vac Low Risk Adult (2 of 2 - PPSV23) 10/04/2020 (Originally 10/03/2015)  . INFLUENZA VACCINE  Completed   Fall Risk  01/27/2019 12/13/2018 11/26/2018 09/16/2018 05/06/2018  Falls in the past year? 0 0 0 No No  Number falls in past yr: 0 0 0 - -  Comment - - - - -  Injury with Fall? 0 0 0 - -  Follow up - - - - -    There were no vitals filed for this visit. There is no height or weight on file to calculate BMI. Physical  Exam  Unable to complete on Telephone visit.   Labs reviewed: No results for input(s): NA, K, CL, CO2, GLUCOSE, BUN, CREATININE, CALCIUM, MG, PHOS in the last 8760 hours. No results for input(s): AST, ALT, ALKPHOS, BILITOT, PROT, ALBUMIN in the last 8760 hours. No results for input(s): WBC, NEUTROABS, HGB, HCT, MCV, PLT in the last 8760 hours. Lab Results  Component Value Date   TSH 3.340 10/17/2014   Lab Results  Component Value Date   HGBA1C 5.8 (H) 10/07/2016   Lab Results  Component Value Date   CHOL 157 09/10/2018   HDL 53 09/10/2018   LDLCALC 89 09/10/2018   TRIG 65 09/10/2018   CHOLHDL 3.0 09/10/2018    Significant Diagnostic Results in last 30 days:  Dg Finger Little Left  Result Date: 10/01/2019 CLINICAL DATA:  Laceration, injury while straightening knife blade EXAM: LEFT LITTLE FINGER 2+V COMPARISON:  None. FINDINGS: Age indeterminate nondisplaced fracture involving an osteophyte at the dorsal base of the fifth distal phalanx. No other acute fracture or traumatic malalignment is seen. Soft tissue irregularity and laceration noted at tip digit. No subcutaneous gas or foreign body. IMPRESSION: Age indeterminate nondisplaced osteophyte fracture at the dorsal base of the fifth distal phalanx. Correlate for point tenderness. Soft tissue laceration of the tip of the fifth digit. No subcutaneous gas or foreign body. Electronically Signed   By: Lovena Le M.D.   On: 10/01/2019 20:40    Assessment/Plan   Laceration of left little finger without foreign body without damage to nail, subsequent encounter Afebrile.Noted yellow discoloration on the corner of the nail.No drainage,redness, or swelling on finger or laceration.suspect this could be from betadine discoloration rather than infections.he will monitor for any signs of infection then notify provider.will call in oral ABX if signs of infection is noted.   Family/ staff Communication: Reviewed plan of care with  patient.  Labs/tests ordered: None Spent 11 minutes of non-face to face with patient     Sandrea Hughs, NP

## 2019-10-06 NOTE — Telephone Encounter (Signed)
Patient called requesting TeleVisit. Stated that he had a laceration to his finger and was seen yesterday but wants to speak with Dinah today due to pus on finger this morning. Televisit scheduled.

## 2019-10-10 ENCOUNTER — Other Ambulatory Visit: Payer: Self-pay

## 2019-10-10 ENCOUNTER — Encounter: Payer: Self-pay | Admitting: Nurse Practitioner

## 2019-10-10 ENCOUNTER — Ambulatory Visit: Payer: Medicare Other | Admitting: Family

## 2019-10-10 ENCOUNTER — Ambulatory Visit (INDEPENDENT_AMBULATORY_CARE_PROVIDER_SITE_OTHER): Payer: Medicare Other | Admitting: Nurse Practitioner

## 2019-10-10 VITALS — BP 126/70 | HR 69 | Temp 98.4°F | Ht 69.0 in | Wt 188.2 lb

## 2019-10-10 DIAGNOSIS — S61217D Laceration without foreign body of left little finger without damage to nail, subsequent encounter: Secondary | ICD-10-CM | POA: Diagnosis not present

## 2019-10-10 DIAGNOSIS — F411 Generalized anxiety disorder: Secondary | ICD-10-CM

## 2019-10-10 DIAGNOSIS — G4700 Insomnia, unspecified: Secondary | ICD-10-CM

## 2019-10-10 MED ORDER — ALPRAZOLAM 0.5 MG PO TABS: ORAL_TABLET | ORAL | 0 refills | Status: DC

## 2019-10-10 MED ORDER — DIAZEPAM 5 MG PO TABS: 5.0000 mg | ORAL_TABLET | Freq: Every day | ORAL | 0 refills | Status: DC

## 2019-10-10 NOTE — Patient Instructions (Signed)
You can keep laceration covered but take it off in the shower and let water run over area and cleanses finger (would clean at least daily)  Look for redness around laceration and increase pain.   Do not use peroxide to clean  Follow up in Friday for wound check

## 2019-10-10 NOTE — Progress Notes (Signed)
Careteam: Patient Care Team: [name], DO as PCP - General (Geriatric Medicine) Javier Glazier, MD as Consulting Physician (Pulmonary Disease) Danella Sensing, MD as Consulting Physician (Dermatology) Jessy Oto, MD as Consulting Physician (Orthopedic Surgery) Irine Seal, MD as Attending Physician (Urology) Jackolyn Confer, MD as Consulting Physician (General Surgery) Melrose Nakayama, MD as Consulting Physician (Cardiothoracic Surgery) Rozetta Nunnery, MD as Consulting Physician (Otolaryngology) Griselda Miner, MD as Consulting Physician (Dermatology)  Advanced Directive information    Allergies  Allergen Reactions  . Heparin     Blood clots   . Penicillin G Shortness Of Breath  . Levaquin [Levofloxacin In D5w] Other (See Comments)    Patient felt jittery and nervous with insomnia on the medication.  . Aspirin     Breathing complications, can tolerate baby asa  . Codeine     Some is ok  . Sucralfate     Could not sleep  . Zetia [Ezetimibe]     jittery  . Zocor [Simvastatin]     Liver enzymes increased  . Plavix [Clopidogrel Bisulfate] Palpitations    Chief Complaint  Patient presents with  . Acute Visit    Finger concerns      HPI: Patient is a 83 y.o. male seen in the office today due to finger concerns.  Went to ED 9 days ago for laceration which he was glued originally hand was swollen but this has improved There is yellow drainage under the glue, it had some discharge under the nail last week but nowno drainage.  Pain is improving. No redness or heat. Cleaning with peroxide. Has not washed with water  His brother in law lost his finger to a laceration.    uses valium at night to help his sleep- always had trouble sleeping- this is a chronic medication. Also needs refill on xanax    Review of Systems:  Review of Systems  Constitutional: Negative for chills, fever and malaise/fatigue.  Skin:       Laceration to left 5th  finger    Past Medical History:  Diagnosis Date  . Allergic rhinitis due to pollen   . Anginal pain ([place])    once in a while, none recent  . Anxiety   . Anxiety disorder   . Asthma    exercise induced  . Atrial fibrillation (HCC)    hx of  . Basal cell carcinoma of skin of lip   . Clotting disorder ([place])   . Coronary atherosclerosis of native coronary artery    with CABG 3/12  -MAZE procedure   . Diastasis recti 05/07/2016  . Difficulty urinating   . Diverticulosis of colon (without mention of hemorrhage)    mild  . Dysuria   . Edema   . Essential hypertension, benign   . Heart attack ([place]) 2012  . Helicobacter pylori (H. pylori)   . Heparin induced thrombocytopenia ([place])    3/12  . History of anal fissures   . History of cholelithiasis   . Hyperlipidemia   . Hypertrophy of prostate with urinary obstruction and other lower urinary tract symptoms (LUTS)   . Impotence of organic origin   . Internal hemorrhoids without mention of complication   . Jaundice age 40  . Long term (current) use of anticoagulants   . Neutropenia ([facility]) 10/07/2016  . Nonspecific elevation of levels of transaminase or lactic acid dehydrogenase (LDH)   . Other voice and resonance disorders    after surgery, took breathing  tube out and damaged something  . Pneumonia   . Pulmonary embolism ([place]) 2012   s/p heart surgery  . Pulmonary embolism ([place])    3/12  . Rheumatic fever age 17  . Skin cancer    Hx of squamous cell x2  . Sleep apnea    no CPAP  . Unspecified constipation    Past Surgical History:  Procedure Laterality Date  . CARDIAC CATHETERIZATION    . CHOLECYSTECTOMY N/A 11/18/2013   Procedure: LAPAROSCOPIC CHOLECYSTECTOMY With IOC;  Surgeon: Odis Hollingshead, MD;  Location: [facility];  Service: General;  Laterality: N/A;  . CORONARY ARTERY BYPASS GRAFT  2012   Median sternotomy, extracorporeal circulation, coronary  . HERNIA REPAIR  2010   bilateral  . HYDROCELE EXCISION     dr  [name]   . MOHS SURGERY     squamus x2; BCC fo bridge of nose June 2017.  Marland Kitchen SCROTUM EXPLORATION  1990's   multiple  . SPERMATOCELECTOMY  02/1999   Right, Dr.[name]  . SUPERFICIAL SKIN CYSTECTOMY (l) HIP Right 1997   dr [name]  . VASECTOMY  04/1992   Social History:   reports that he quit smoking about 41 years ago. His smoking use included cigarettes. He started smoking about 63 years ago. He has a 22.00 pack-year smoking history. He has never used smokeless tobacco. He reports current alcohol use. He reports that he does not use drugs.  Family History  Problem Relation Age of Onset  . Bone cancer Mother   . Diabetes Mother   . Colon cancer Neg Hx   . Esophageal cancer Neg Hx   . Rectal cancer Neg Hx   . Stomach cancer Neg Hx   . Lung disease Neg Hx     Medications: Patient's Medications  New Prescriptions   No medications on file  Previous Medications   ALBUTEROL (PROVENTIL HFA;VENTOLIN HFA) 108 (90 BASE) MCG/ACT INHALER    Inhale 2 puffs into the lungs every 6 (six) hours as needed for wheezing.   ALPRAZOLAM (XANAX) 0.5 MG TABLET    TAKE 1 TO 2 TABLETS BY MOUTH EVERY DAY AS NEEDED FOR ANXIETY   ASPIRIN 81 MG TABLET    Take 81 mg by mouth 3 (three) times a week. Mondays, wednesdays and fridays   DIAZEPAM (VALIUM) 5 MG TABLET    Take 5 mg by mouth at bedtime.   OMEPRAZOLE (PRILOSEC) 20 MG CAPSULE    Take 1 capsule (20 mg total) by mouth daily as needed (heartburn).   ROSUVASTATIN (CRESTOR) 10 MG TABLET    Take 1 tablet (10 mg total) by mouth daily.  Modified Medications   No medications on file  Discontinued Medications   No medications on file    Physical Exam:  Vitals:   10/10/19 1554  BP: 126/70  Pulse: 69  Temp: 98.4 F (36.9 C)  TempSrc: Temporal  SpO2: 98%  Weight: 188 lb 3.2 oz (85.4 kg)  Height: 5\' 9"  (1.753 m)   Body mass index is 27.79 kg/m. Wt Readings from Last 3 Encounters:  10/10/19 188 lb 3.2 oz (85.4 kg)  10/05/19 188 lb 12.8 oz (85.6 kg)   10/01/19 183 lb (83 kg)    Physical Exam Constitutional:      Appearance: Normal appearance.  HENT:     Head: Normocephalic and atraumatic.  Skin:    General: Skin is warm and dry.     Findings: No bruising or erythema.     Comments: 5th finger  on left hand with laceration. Glue noted to laceration from ED, small amount of yellow drainage under the glue. No heat, drainage or tenderness noted. Small amount of swelling noted.   Neurological:     General: No focal deficit present.     Mental Status: He is alert.     Labs reviewed: Basic Metabolic Panel: No results for input(s): NA, K, CL, CO2, GLUCOSE, BUN, CREATININE, CALCIUM, MG, PHOS, TSH in the last 8760 hours. Liver Function Tests: No results for input(s): AST, ALT, ALKPHOS, BILITOT, PROT, ALBUMIN in the last 8760 hours. No results for input(s): LIPASE, AMYLASE in the last 8760 hours. No results for input(s): AMMONIA in the last 8760 hours. CBC: No results for input(s): WBC, NEUTROABS, HGB, HCT, MCV, PLT in the last 8760 hours. Lipid Panel: No results for input(s): CHOL, HDL, LDLCALC, TRIG, CHOLHDL, LDLDIRECT in the last 8760 hours. TSH: No results for input(s): TSH in the last 8760 hours. A1C: Lab Results  Component Value Date   HGBA1C 5.8 (H) 10/07/2016     Assessment/Plan 1. Anxiety state Database checked and refill provided.  - ALPRAZolam (XANAX) 0.5 MG tablet; TAKE 1 TO 2 TABLETS BY MOUTH EVERY DAY AS NEEDED FOR ANXIETY  Dispense: 60 tablet; Refill: 0  2. Insomnia, unspecified type Database checked, Refill provided.  - diazepam (VALIUM) 5 MG tablet; Take 1 tablet (5 mg total) by mouth at bedtime.  Dispense: 30 tablet; Refill: 0  3. Laceration of left little finger without foreign body without damage to nail, subsequent encounter Healing well. Reassurance given. Encouraged to stop using peroxide.  -to clean 1-2 times daily with water and mild soap. -will follow up in office in 4 days to reassess as he is very  anxious and wanting [place] to keep a close eye on it.   Next appt: 4 days.  Carlos American. [place], [facility] [phone]

## 2019-10-14 ENCOUNTER — Ambulatory Visit (INDEPENDENT_AMBULATORY_CARE_PROVIDER_SITE_OTHER): Payer: Medicare Other | Admitting: Nurse Practitioner

## 2019-10-14 ENCOUNTER — Encounter: Payer: Self-pay | Admitting: Nurse Practitioner

## 2019-10-14 ENCOUNTER — Other Ambulatory Visit: Payer: Self-pay

## 2019-10-14 VITALS — BP 132/78 | HR 88 | Temp 97.5°F | Resp 18 | Ht 69.0 in | Wt 187.6 lb

## 2019-10-14 DIAGNOSIS — S61217D Laceration without foreign body of left little finger without damage to nail, subsequent encounter: Secondary | ICD-10-CM | POA: Diagnosis not present

## 2019-10-14 NOTE — Progress Notes (Signed)
Careteam: Patient Care Team: [name], DO as PCP - General (Geriatric Medicine) Javier Glazier, MD as Consulting Physician (Pulmonary Disease) Danella Sensing, MD as Consulting Physician (Dermatology) Jessy Oto, MD as Consulting Physician (Orthopedic Surgery) Irine Seal, MD as Attending Physician (Urology) Jackolyn Confer, MD as Consulting Physician (General Surgery) Melrose Nakayama, MD as Consulting Physician (Cardiothoracic Surgery) Rozetta Nunnery, MD as Consulting Physician (Otolaryngology) Griselda Miner, MD as Consulting Physician (Dermatology)  Advanced Directive information Does Patient Have a Medical Advance Directive?: No  Allergies  Allergen Reactions  . Heparin     Blood clots   . Penicillin G Shortness Of Breath  . Levaquin [Levofloxacin In D5w] Other (See Comments)    Patient felt jittery and nervous with insomnia on the medication.  . Aspirin     Breathing complications, can tolerate baby asa  . Codeine     Some is ok  . Sucralfate     Could not sleep  . Zetia [Ezetimibe]     jittery  . Zocor [Simvastatin]     Liver enzymes increased  . Plavix [Clopidogrel Bisulfate] Palpitations    Chief Complaint  Patient presents with  . Acute Visit    Recheck finger     HPI: Patient is a 83 y.o. male seen in the office today for finger recheck Has done a lot of healing since last visit.  Overall swelling improved.  No redness, drainage or pain (unless pressed hard Continues with some numbness to tip of finger but improving.  No longer cleaning with peroxide Using dial soap and keeping under warm water several times daily  Review of Systems:  Review of Systems  Constitutional: Negative for chills and fever.  Skin:       Healing laceration to 5th finger of left hand    Past Medical History:  Diagnosis Date  . Allergic rhinitis due to pollen   . Anginal pain ([place])    once in a while, none recent  . Anxiety   . Anxiety  disorder   . Asthma    exercise induced  . Atrial fibrillation (HCC)    hx of  . Basal cell carcinoma of skin of lip   . Clotting disorder ([place])   . Coronary atherosclerosis of native coronary artery    with CABG 3/12  -MAZE procedure   . Diastasis recti 05/07/2016  . Difficulty urinating   . Diverticulosis of colon (without mention of hemorrhage)    mild  . Dysuria   . Edema   . Essential hypertension, benign   . Heart attack ([place]) 2012  . Helicobacter pylori (H. pylori)   . 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 History of anal fissures   . History of cholelithiasis   . Hyperlipidemia   . Hypertrophy of prostate with urinary obstruction and other lower urinary tract symptoms (LUTS)   . Impotence of organic origin   . Internal hemorrhoids without mention of complication   . Jaundice age 64  . Long term (current) use of anticoagulants   . Neutropenia ([employer]) 10/07/2016  . Nonspecific elevation of levels of transaminase or lactic acid dehydrogenase (LDH)   . Other voice and resonance disorders    after surgery, took breathing tube out and damaged something  . Pneumonia   . Pulmonary embolism ([place]) 2012   s/p heart surgery  . Pulmonary embolism ([place])    3/12  . Rheumatic fever age 21  . Skin cancer  Hx of squamous cell x2  . Sleep apnea    no CPAP  . Unspecified constipation    Past Surgical History:  Procedure Laterality Date  . CARDIAC CATHETERIZATION    . CHOLECYSTECTOMY N/A 11/18/2013   Procedure: LAPAROSCOPIC CHOLECYSTECTOMY With IOC;  Surgeon: Odis Hollingshead, MD;  Location: [facility];  Service: General;  Laterality: N/A;  . CORONARY ARTERY BYPASS GRAFT  2012   Median sternotomy, extracorporeal circulation, coronary  . HERNIA REPAIR  2010   bilateral  . HYDROCELE EXCISION     dr [name]   . MOHS SURGERY     squamus x2; BCC fo bridge of nose June 2017.  Marland Kitchen SCROTUM EXPLORATION  1990's   multiple  . SPERMATOCELECTOMY  02/1999   Right, Dr.[name]  .  SUPERFICIAL SKIN CYSTECTOMY (l) HIP Right 1997   dr [name]  . VASECTOMY  04/1992   Social History:   reports that he quit smoking about 41 years ago. His smoking use included cigarettes. He started smoking about 63 years ago. He has a 22.00 pack-year smoking history. He has never used smokeless tobacco. He reports current alcohol use. He reports that he does not use drugs.  Family History  Problem Relation Age of Onset  . Bone cancer Mother   . Diabetes Mother   . Colon cancer Neg Hx   . Esophageal cancer Neg Hx   . Rectal cancer Neg Hx   . Stomach cancer Neg Hx   . Lung disease Neg Hx     Medications: Patient's Medications  New Prescriptions   No medications on file  Previous Medications   ALBUTEROL (PROVENTIL HFA;VENTOLIN HFA) 108 (90 BASE) MCG/ACT INHALER    Inhale 2 puffs into the lungs every 6 (six) hours as needed for wheezing.   ALPRAZOLAM (XANAX) 0.5 MG TABLET    TAKE 1 TO 2 TABLETS BY MOUTH EVERY DAY AS NEEDED FOR ANXIETY   ASPIRIN 81 MG TABLET    Take 81 mg by mouth 3 (three) times a week. Mondays, wednesdays and fridays   DIAZEPAM (VALIUM) 5 MG TABLET    Take 1 tablet (5 mg total) by mouth at bedtime.   OMEPRAZOLE (PRILOSEC) 20 MG CAPSULE    Take 1 capsule (20 mg total) by mouth daily as needed (heartburn).   ROSUVASTATIN (CRESTOR) 10 MG TABLET    Take 1 tablet (10 mg total) by mouth daily.  Modified Medications   No medications on file  Discontinued Medications   No medications on file    Physical Exam:  Vitals:   10/14/19 1143  BP: 132/78  Pulse: 88  Resp: 18  Temp: (!) 97.5 F (36.4 C)  TempSrc: Oral  SpO2: 97%  Weight: 187 lb 9.6 oz (85.1 kg)  Height: 5\' 9"  (1.753 m)   Body mass index is 27.7 kg/m. Wt Readings from Last 3 Encounters:  10/14/19 187 lb 9.6 oz (85.1 kg)  10/10/19 188 lb 3.2 oz (85.4 kg)  10/05/19 188 lb 12.8 oz (85.6 kg)    Physical Exam Constitutional:      Appearance: Normal appearance.  HENT:     Head: Normocephalic and  atraumatic.  Skin:    General: Skin is warm and dry.     Findings: No erythema.     Comments: Healing laceration to 5th finger on left hand.  Minimal swelling, without drainage or erythema   Neurological:     Mental Status: He is alert.     Labs reviewed: Basic Metabolic Panel: No  results for input(s): NA, K, CL, CO2, GLUCOSE, BUN, CREATININE, CALCIUM, MG, PHOS, TSH in the last 8760 hours. Liver Function Tests: No results for input(s): AST, ALT, ALKPHOS, BILITOT, PROT, ALBUMIN in the last 8760 hours. No results for input(s): LIPASE, AMYLASE in the last 8760 hours. No results for input(s): AMMONIA in the last 8760 hours. CBC: No results for input(s): WBC, NEUTROABS, HGB, HCT, MCV, PLT in the last 8760 hours. Lipid Panel: No results for input(s): CHOL, HDL, LDLCALC, TRIG, CHOLHDL, LDLDIRECT in the last 8760 hours. TSH: No results for input(s): TSH in the last 8760 hours. A1C: Lab Results  Component Value Date   HGBA1C 5.8 (H) 10/07/2016     Assessment/Plan 1. Laceration of left little finger without foreign body without damage to nail, subsequent encounter -continues to improve -continue routine washes  -continues to eat proper protein intake to promote wound healing.  Next appt: 10/20/2019 as scheduled Osmara Drummonds K. Saxis, [facility] [phone]

## 2019-10-14 NOTE — Patient Instructions (Signed)
Keep cleaning finger twice daily Cont to eat good protein in diet.

## 2019-10-20 ENCOUNTER — Other Ambulatory Visit: Payer: Self-pay

## 2019-10-20 ENCOUNTER — Encounter: Payer: Self-pay | Admitting: Internal Medicine

## 2019-10-20 ENCOUNTER — Ambulatory Visit (INDEPENDENT_AMBULATORY_CARE_PROVIDER_SITE_OTHER): Payer: Medicare Other | Admitting: Internal Medicine

## 2019-10-20 VITALS — BP 124/72 | HR 69 | Temp 97.5°F | Ht 69.0 in | Wt 188.4 lb

## 2019-10-20 DIAGNOSIS — M48062 Spinal stenosis, lumbar region with neurogenic claudication: Secondary | ICD-10-CM | POA: Diagnosis not present

## 2019-10-20 DIAGNOSIS — I25810 Atherosclerosis of coronary artery bypass graft(s) without angina pectoris: Secondary | ICD-10-CM | POA: Diagnosis not present

## 2019-10-20 DIAGNOSIS — I209 Angina pectoris, unspecified: Secondary | ICD-10-CM

## 2019-10-20 DIAGNOSIS — S61217D Laceration without foreign body of left little finger without damage to nail, subsequent encounter: Secondary | ICD-10-CM

## 2019-10-20 DIAGNOSIS — E7849 Other hyperlipidemia: Secondary | ICD-10-CM

## 2019-10-20 DIAGNOSIS — R350 Frequency of micturition: Secondary | ICD-10-CM

## 2019-10-20 DIAGNOSIS — N401 Enlarged prostate with lower urinary tract symptoms: Secondary | ICD-10-CM | POA: Diagnosis not present

## 2019-10-20 DIAGNOSIS — R311 Benign essential microscopic hematuria: Secondary | ICD-10-CM

## 2019-10-20 LAB — URINALYSIS, ROUTINE W REFLEX MICROSCOPIC
Bacteria, UA: NONE SEEN /HPF
Bilirubin Urine: NEGATIVE
Glucose, UA: NEGATIVE
Hyaline Cast: NONE SEEN /LPF
Ketones, ur: NEGATIVE
Leukocytes,Ua: NEGATIVE
Nitrite: NEGATIVE
Protein, ur: NEGATIVE
RBC / HPF: NONE SEEN /HPF (ref 0–2)
Specific Gravity, Urine: 1.008 (ref 1.001–1.03)
Squamous Epithelial / HPF: NONE SEEN /HPF (ref <=5)
WBC, UA: NONE SEEN /HPF (ref 0–5)
pH: 6 (ref 5.0–8.0)

## 2019-10-20 MED ORDER — GABAPENTIN 100 MG PO CAPS: 100.0000 mg | ORAL_CAPSULE | Freq: Every day | ORAL | 0 refills | Status: DC

## 2019-10-20 NOTE — Progress Notes (Signed)
Location:  [facility] clinic Provider: Heily Carlucci L. Mariea Clonts, D.O., C.M.D.  Goals of Care:  Advanced Directives 10/20/2019  Does Patient Have a Medical Advance Directive? No  Type of Advance Directive -  Does patient want to make changes to medical advance directive? -  Copy of [place] in Chart? -  Would patient like information on creating a medical advance directive? No - Patient declined  Pre-existing out of facility DNR order (yellow form or pink MOST form) -     Chief Complaint  Patient presents with  . Follow-up    2-week followup on left little finger, has hip and lower back pain    HPI: Patient is a 83 y.o. male seen today for an acute visit to follow-up on injury to left 5th digit, as well as hip and lower back pain.  Looking back, he first injured his finger 10/24 when he was attempting to straighten a butter knife and it broke in half and the sharp edge slide down into the little finger resulting in a laceration of the distal, lateral edge of the finger w/o nail involvement.  There was an osteophyte fracture on the xray.  The laceration was repaired with dermabond in the ED.  In the office on 10/28 it was cleansed with saline and patted dry, bandaid applied.  Tylenol use encouraged.  He was to wrap it was plastic wrap for showers.  He noted his finger was yellow the following day and it was though to be due to betadine or peroxide used for cleansing.  He then returned again 11/2 about insomnia--needed his meds refilled.  He was seen again 11/6 about the laceration which continued to improve.  Adequate protein intake was encouraged and regular washing of the finger.    Finger is healing well.  It's almost three weeks Saturday.  There's a nice scab on it now.  It bled like crazy at first.  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  He missed it as therapy for anxiety also.    Oxygen level is good, but he  notes shortwindedness if he hurries.  He has to get a few big breaths.  He is not really worried about this.  He has always struggled from exercise-induced asthma.  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    He quit walking a month ago again.  He has been having more pain in his back and hips.  He's started having some radicular pain in his lateral thighs here lately.  He had probably not taken but 30 pain pills in his life.  He has to turn over 25-30 times.  When he lays on his left, it radiates through.  Same can happen on the right then.  Affects sleep considerably.    Since 58, he's had to urinate more often than normal.  Now he has a little leakage of urine.  He has had a thickening of his bladder noted.  He has mesh in both lower quadrants from hernia repairs.  Has BPH also.  When he has to go, he's got to hurry up and go.  He does not want to have surgery for his prostate.  It cannot be cauterized.  He's managing these symptoms ok.  He has put a bedside urinal in his car for emergencies.  He had a cystoscopy a year and a half or 2 years ago that was  clear for bladder ca.    Past Medical History:  Diagnosis Date  . Allergic rhinitis due to pollen   . Anginal pain ([place])    once in a while, none recent  . Anxiety   . Anxiety disorder   . Asthma    exercise induced  . Atrial fibrillation (HCC)    hx of  . Basal cell carcinoma of skin of lip   . Clotting disorder ([place])   . Coronary atherosclerosis of native coronary artery    with CABG 3/12  -MAZE procedure   . Diastasis recti 05/07/2016  . Difficulty urinating   . Diverticulosis of colon (without mention of hemorrhage)    mild  . Dysuria   . Edema   . Essential hypertension, benign   . Heart attack ([place]) 2012  . Helicobacter pylori (H. pylori)   . 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 History of anal fissures   . History of cholelithiasis   . Hyperlipidemia   . Hypertrophy of prostate with urinary  obstruction and other lower urinary tract symptoms (LUTS)   . Impotence of organic origin   . Internal hemorrhoids without mention of complication   . Jaundice age 59  . Long term (current) use of anticoagulants   . Neutropenia ([place]) 10/07/2016  . Nonspecific elevation of levels of transaminase or lactic acid dehydrogenase (LDH)   . Other voice and resonance disorders    after surgery, took breathing tube out and damaged something  . Pneumonia   . Pulmonary embolism ([place]) 2012   s/p heart surgery  . Pulmonary embolism ([place])    3/12  . Rheumatic fever age 60  . Skin cancer    Hx of squamous cell x2  . Sleep apnea    no CPAP  . Unspecified constipation     Past Surgical History:  Procedure Laterality Date  . CARDIAC CATHETERIZATION    . CHOLECYSTECTOMY N/A 11/18/2013   Procedure: LAPAROSCOPIC CHOLECYSTECTOMY With IOC;  Surgeon: Odis Hollingshead, MD;  Location: [facility];  Service: General;  Laterality: N/A;  . CORONARY ARTERY BYPASS GRAFT  2012   Median sternotomy, extracorporeal circulation, coronary  . HERNIA REPAIR  2010   bilateral  . HYDROCELE EXCISION     dr [name]   . MOHS SURGERY     squamus x2; BCC fo bridge of nose June 2017.  Marland Kitchen SCROTUM EXPLORATION  1990's   multiple  . SPERMATOCELECTOMY  02/1999   Right, Dr.[name]  . SUPERFICIAL SKIN CYSTECTOMY (l) HIP Right 1997   dr [name]  . VASECTOMY  04/1992    Allergies  Allergen Reactions  . Heparin     Blood clots   . Penicillin G Shortness Of Breath  . Levaquin [Levofloxacin In D5w] Other (See Comments)    Patient felt jittery and nervous with insomnia on the medication.  . Aspirin     Breathing complications, can tolerate baby asa  . Codeine     Some is ok  . Sucralfate     Could not sleep  . Zetia [Ezetimibe]     jittery  . Zocor [Simvastatin]     Liver enzymes increased  . Plavix [Clopidogrel Bisulfate] Palpitations    Outpatient Encounter Medications as of 10/20/2019  Medication Sig  . albuterol  (PROVENTIL HFA;VENTOLIN HFA) 108 (90 BASE) MCG/ACT inhaler Inhale 2 puffs into the lungs every 6 (six) hours as needed for wheezing.  Marland Kitchen ALPRAZolam (XANAX) 0.5 MG tablet TAKE 1 TO 2  TABLETS BY MOUTH EVERY DAY AS NEEDED FOR ANXIETY  . aspirin 81 MG tablet Take 81 mg by mouth 3 (three) times a week. Mondays, wednesdays and fridays  . diazepam (VALIUM) 5 MG tablet Take 1 tablet (5 mg total) by mouth at bedtime.  Marland Kitchen omeprazole (PRILOSEC) 20 MG capsule Take 1 capsule (20 mg total) by mouth daily as needed (heartburn).  . rosuvastatin (CRESTOR) 10 MG tablet Take 1 tablet (10 mg total) by mouth daily.   No facility-administered encounter medications on file as of 10/20/2019.     Review of Systems:  Review of Systems  Constitutional: Negative for chills, fever and malaise/fatigue.  HENT: Positive for congestion. Negative for hearing loss and sore throat.   Eyes: Negative for blurred vision.  Respiratory: Positive for shortness of breath. Negative for cough and wheezing.   Cardiovascular: Negative for chest pain, palpitations, orthopnea, leg swelling and PND.  Gastrointestinal: Negative for abdominal pain, blood in stool, constipation, diarrhea and melena.  Genitourinary: Positive for urgency. Negative for dysuria.       Leakage, nocturia  Musculoskeletal: Positive for back pain and joint pain. Negative for falls.  Skin: Negative for itching and rash.  Neurological: Positive for tingling and sensory change. Negative for loss of consciousness.  Endo/Heme/Allergies: Does not bruise/bleed easily.  Psychiatric/Behavioral: Negative for memory loss. The patient is nervous/anxious and has insomnia.        PTSD, OCD    Health Maintenance  Topic Date Due  . PNA vac Low Risk Adult (2 of 2 - PPSV23) 10/04/2020 (Originally 10/03/2015)  . TETANUS/TDAP  09/30/2029  . INFLUENZA VACCINE  Completed    Physical Exam: Vitals:   10/20/19 1023  BP: 124/72  Pulse: 69  Temp: (!) 97.5 F (36.4 C)  TempSrc:  Temporal  SpO2: 97%  Weight: 188 lb 6.4 oz (85.5 kg)  Height: 5\' 9"  (1.753 m)   Body mass index is 27.82 kg/m. Physical Exam Vitals signs reviewed.  Constitutional:      Appearance: Normal appearance.  HENT:     Head: Normocephalic and atraumatic.  Eyes:     Comments: glasses  Pulmonary:     Effort: Pulmonary effort is normal.  Musculoskeletal: Normal range of motion.        General: Tenderness present.     Comments: Tender over lower lumbar/sacoiliac regions, b/l hips  Skin:    Comments: Finger laceration healing--now has scab over it when dressing removed  Neurological:     Mental Status: He is alert. Mental status is at baseline.  Psychiatric:        Mood and Affect: Mood normal.     Labs reviewed: Basic Metabolic Panel: No results for input(s): NA, K, CL, CO2, GLUCOSE, BUN, CREATININE, CALCIUM, MG, PHOS, TSH in the last 8760 hours. Liver Function Tests: No results for input(s): AST, ALT, ALKPHOS, BILITOT, PROT, ALBUMIN in the last 8760 hours. No results for input(s): LIPASE, AMYLASE in the last 8760 hours. No results for input(s): AMMONIA in the last 8760 hours. CBC: No results for input(s): WBC, NEUTROABS, HGB, HCT, MCV, PLT in the last 8760 hours. Lipid Panel: No results for input(s): CHOL, HDL, LDLCALC, TRIG, CHOLHDL, LDLDIRECT in the last 8760 hours. Lab Results  Component Value Date   HGBA1C 5.8 (H) 10/07/2016    Procedures since last visit: Dg Finger Little Left  Result Date: 10/01/2019 CLINICAL DATA:  Laceration, injury while straightening knife blade EXAM: LEFT LITTLE FINGER 2+V COMPARISON:  None. FINDINGS: Age indeterminate nondisplaced fracture involving  an osteophyte at the dorsal base of the fifth distal phalanx. No other acute fracture or traumatic malalignment is seen. Soft tissue irregularity and laceration noted at tip digit. No subcutaneous gas or foreign body. IMPRESSION: Age indeterminate nondisplaced osteophyte fracture at the dorsal base of  the fifth distal phalanx. Correlate for point tenderness. Soft tissue laceration of the tip of the fifth digit. No subcutaneous gas or foreign body. Electronically Signed   By: Lovena Le M.D.   On: 10/01/2019 20:40    Assessment/Plan 1. Spinal stenosis of lumbar region with neurogenic claudication - may use tylenol - try gabapentin low dose at hs - gabapentin (NEURONTIN) 100 MG capsule; Take 1 capsule (100 mg total) by mouth at bedtime.  Dispense: 30 capsule; Refill: 0  2. Coronary artery disease involving coronary bypass graft without angina pectoris, unspecified whether native or transplanted heart -no chest pain -slight increasing dyspnea with activity since not running anymore -cont asa three times weekly  3. Benign prostatic hyperplasia with urinary frequency -some increase in leakage   4. Laceration of left little finger without foreign body without damage to nail, subsequent encounter -recovering well  5. Angina pectoris ([place]) -No recent chest pains  6.  Hyperlipidemia -Continues on crestor at hs  7.  Microscopic hematuria -check UA  Labs/tests ordered:   Lab Orders     CBC with Differential/Platelet     COMPLETE METABOLIC PANEL WITH GFR     Lipid panel     Urinalysis with Reflex Microscopic PSA also Do UA today, rest when fasting asap  Next appt: CPE in Feb  Takeia Ciaravino L. Destry Bezdek, D.O. [facility] [address], [place] [address] Cell Phone (Mon-Fri 8am-5pm):  [phone] On Call:  [phone] & follow prompts after 5pm & weekends Office Phone:  [phone] Office Fax:  [phone]

## 2019-10-25 ENCOUNTER — Other Ambulatory Visit: Payer: Medicare Other

## 2019-10-26 ENCOUNTER — Other Ambulatory Visit: Payer: Self-pay

## 2019-10-26 ENCOUNTER — Other Ambulatory Visit: Payer: Medicare Other

## 2019-10-26 DIAGNOSIS — I25810 Atherosclerosis of coronary artery bypass graft(s) without angina pectoris: Secondary | ICD-10-CM | POA: Diagnosis not present

## 2019-10-26 DIAGNOSIS — E7849 Other hyperlipidemia: Secondary | ICD-10-CM | POA: Diagnosis not present

## 2019-10-26 DIAGNOSIS — R350 Frequency of micturition: Secondary | ICD-10-CM | POA: Diagnosis not present

## 2019-10-26 DIAGNOSIS — I209 Angina pectoris, unspecified: Secondary | ICD-10-CM | POA: Diagnosis not present

## 2019-10-27 LAB — CBC WITH DIFFERENTIAL/PLATELET
Absolute Monocytes: 448 cells/uL (ref 200–950)
Basophils Absolute: 28 cells/uL (ref 0–200)
Basophils Relative: 0.4 %
Eosinophils Absolute: 70 cells/uL (ref 15–500)
Eosinophils Relative: 1 %
HCT: 48.3 % (ref 38.5–50.0)
Hemoglobin: 16.5 g/dL (ref 13.2–17.1)
Lymphs Abs: 2030 cells/uL (ref 850–3900)
MCH: 31.8 pg (ref 27.0–33.0)
MCHC: 34.2 g/dL (ref 32.0–36.0)
MCV: 93.1 fL (ref 80.0–100.0)
MPV: 8.9 fL (ref 7.5–12.5)
Monocytes Relative: 6.4 %
Neutro Abs: 4424 cells/uL (ref 1500–7800)
Neutrophils Relative %: 63.2 %
Platelets: 238 10*3/uL (ref 140–400)
RBC: 5.19 10*6/uL (ref 4.20–5.80)
RDW: 12.3 % (ref 11.0–15.0)
Total Lymphocyte: 29 %
WBC: 7 10*3/uL (ref 3.8–10.8)

## 2019-10-27 LAB — COMPLETE METABOLIC PANEL WITH GFR
AG Ratio: 1.6 (calc) (ref 1.0–2.5)
ALT: 26 U/L (ref 9–46)
AST: 28 U/L (ref 10–35)
Albumin: 4.2 g/dL (ref 3.6–5.1)
Alkaline phosphatase (APISO): 41 U/L (ref 35–144)
BUN: 14 mg/dL (ref 7–25)
CO2: 31 mmol/L (ref 20–32)
Calcium: 9.4 mg/dL (ref 8.6–10.3)
Chloride: 102 mmol/L (ref 98–110)
Creat: 1.03 mg/dL (ref 0.70–1.11)
GFR, Est African American: 79 mL/min/{1.73_m2} (ref >=60)
GFR, Est Non African American: 68 mL/min/{1.73_m2} (ref >=60)
Globulin: 2.6 g/dL (calc) (ref 1.9–3.7)
Glucose, Bld: 103 mg/dL — ABNORMAL HIGH (ref 65–99)
Potassium: 4.8 mmol/L (ref 3.5–5.3)
Sodium: 141 mmol/L (ref 135–146)
Total Bilirubin: 1.4 mg/dL — ABNORMAL HIGH (ref 0.2–1.2)
Total Protein: 6.8 g/dL (ref 6.1–8.1)

## 2019-10-27 LAB — LIPID PANEL
Cholesterol: 155 mg/dL (ref <200)
HDL: 56 mg/dL (ref >=40)
LDL Cholesterol (Calc): 84 mg/dL (calc)
Non-HDL Cholesterol (Calc): 99 mg/dL (calc) (ref <130)
Total CHOL/HDL Ratio: 2.8 (calc) (ref <5.0)
Triglycerides: 73 mg/dL (ref <150)

## 2019-10-27 LAB — PSA: PSA: 2.2 ng/mL (ref <=4.0)

## 2019-11-28 ENCOUNTER — Encounter: Payer: Self-pay | Admitting: Family

## 2019-11-29 ENCOUNTER — Other Ambulatory Visit: Payer: Self-pay

## 2019-11-29 ENCOUNTER — Ambulatory Visit (INDEPENDENT_AMBULATORY_CARE_PROVIDER_SITE_OTHER): Payer: Medicare Other | Admitting: Nurse Practitioner

## 2019-11-29 ENCOUNTER — Encounter: Payer: Self-pay | Admitting: Family

## 2019-11-29 DIAGNOSIS — Z0000 Encounter for general adult medical examination without abnormal findings: Secondary | ICD-10-CM | POA: Diagnosis not present

## 2019-11-29 NOTE — Progress Notes (Signed)
   This service is provided via telemedicine  No vital signs collected/recorded due to the encounter was a telemedicine visit.   Location of patient (ex: home, work):  Home  Patient consents to a telephone visit:  Yes  Location of the provider (ex: office, home):  [facility]  Name of any referring provider:  [name], DO  Names of all persons participating in the telemedicine service and their role in the encounter:  S.Chrae B/CMA, Sherrie Mustache, NP, and Patient   Time spent on call:  7 min with medical assistant

## 2019-11-29 NOTE — Progress Notes (Signed)
Subjective:   Alexander [name] is a 83 y.o. male who presents for Medicare Annual/Subsequent preventive examination.  Review of Systems:   Cardiac Risk Factors include: dyslipidemia;male gender     Objective:    Vitals: There were no vitals taken for this visit.  There is no height or weight on file to calculate BMI.  Advanced Directives 11/29/2019 10/20/2019 10/14/2019 10/05/2019 10/01/2019 12/13/2018 11/26/2018  Does Patient Have a Medical Advance Directive? No No No No No Yes No  Type of Advance Directive - - - - - - -  Does patient want to make changes to medical advance directive? Yes (MAU/Ambulatory/Procedural Areas - Information given) - - - - No - Patient declined No - Patient declined  Copy of [place] in Chart? - - - - - - -  Would patient like information on creating a medical advance directive? - No - Patient declined - Yes (ED - Information included in AVS) - - -  Pre-existing out of facility DNR order (yellow form or pink MOST form) - - - - - - -    Tobacco Social History   Tobacco Use  Smoking Status Former Smoker  . Packs/day: 1.00  . Years: 22.00  . Pack years: 22.00  . Types: Cigarettes  . Start date: 08/22/1956  . Quit date: 12/08/1977  . Years since quitting: 42.0  Smokeless Tobacco Never Used  Tobacco Comment   significant second-hand smoke exposure at work     Counseling given: Not Answered Comment: significant second-hand smoke exposure at work   Clinical Intake:  Pre-visit preparation completed: Yes  Pain : 0-10 Pain Score: 5  Pain Type: Chronic pain Pain Location: Back Pain Orientation: Lower Pain Radiating Towards: left sided scaiatic at times Pain Descriptors / Indicators: Aching Pain Onset: More than a month ago Pain Frequency: Constant Pain Relieving Factors: tylenol 325 mg at bedtime Effect of Pain on Daily Activities: restricts some activities  Pain Relieving Factors: tylenol 325 mg at bedtime  BMI - recorded:  27.82 Nutritional Status: BMI 25 -29 Overweight Nutritional Risks: None Diabetes: No  How often do you need to have someone help you when you read instructions, pamphlets, or other written materials from your doctor or pharmacy?: 1 - Never What is the last grade level you completed in school?: 12th grade  Interpreter Needed?: No     Past Medical History:  Diagnosis Date  . Allergic rhinitis due to pollen   . Anginal pain ([place])    once in a while, none recent  . Anxiety   . Anxiety disorder   . Asthma    exercise induced  . Atrial fibrillation (H[facility])    hx of  . Basal cell carcinoma of skin of lip   . Clotting disorder ([place])   . Coronary atherosclerosis of native coronary artery    with CABG 3/12  -MAZE procedure   . Diastasis recti 05/07/2016  . Difficulty urinating   . Diverticulosis of colon (without mention of hemorrhage)    mild  . Dysuria   . Edema   . Essential hypertension, benign   . Heart attack ([place]) 2012  . Helicobacter pylori (H. pylori)   . 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 History of anal fissures   . History of cholelithiasis   . Hyperlipidemia   . Hypertrophy of prostate with urinary obstruction and other lower urinary tract symptoms (LUTS)   . Impotence of organic origin   .  Internal hemorrhoids without mention of complication   . Jaundice age 72  . Long term (current) use of anticoagulants   . Neutropenia ([place]) 10/07/2016  . Nonspecific elevation of levels of transaminase or lactic acid dehydrogenase (LDH)   . Other voice and resonance disorders    after surgery, took breathing tube out and damaged something  . Pneumonia   . Pulmonary embolism ([place]) 2012   s/p heart surgery  . Pulmonary embolism ([place])    3/12  . Rheumatic fever age 83  . Skin cancer    Hx of squamous cell x2  . Sleep apnea    no CPAP  . Unspecified constipation    Past Surgical History:  Procedure Laterality Date  . CARDIAC CATHETERIZATION    .  CHOLECYSTECTOMY N/A 11/18/2013   Procedure: LAPAROSCOPIC CHOLECYSTECTOMY With IOC;  Surgeon: Odis Hollingshead, MD;  Location: [facility];  Service: General;  Laterality: N/A;  . CORONARY ARTERY BYPASS GRAFT  2012   Median sternotomy, extracorporeal circulation, coronary  . HERNIA REPAIR  2010   bilateral  . HYDROCELE EXCISION     dr [name]   . MOHS SURGERY     squamus x2; BCC fo bridge of nose June 2017.  Marland Kitchen SCROTUM EXPLORATION  1990's   multiple  . SPERMATOCELECTOMY  02/1999   Right, Dr.[name]  . SUPERFICIAL SKIN CYSTECTOMY (l) HIP Right 1997   dr [name]  . VASECTOMY  04/1992   Family History  Problem Relation Age of Onset  . Bone cancer Mother   . Diabetes Mother   . Colon cancer Neg Hx   . Esophageal cancer Neg Hx   . Rectal cancer Neg Hx   . Stomach cancer Neg Hx   . Lung disease Neg Hx    Social History   Socioeconomic History  . Marital status: Married    Spouse name: Not on file  . Number of children: 1  . Years of education: Not on file  . Highest education level: Not on file  Occupational History  . Occupation: Presenter, broadcasting: [employer]  Tobacco Use  . Smoking status: Former Smoker    Packs/day: 1.00    Years: 22.00    Pack years: 22.00    Types: Cigarettes    Start date: 08/22/1956    Quit date: 12/08/1977    Years since quitting: 42.0  . Smokeless tobacco: Never Used  . Tobacco comment: significant second-hand smoke exposure at work  Substance and Sexual Activity  . Alcohol use: Yes    Alcohol/week: 0.0 standard drinks    Comment: former heavy alcohol use, rare use  . Drug use: No  . Sexual activity: Yes    Partners: Female  Other Topics Concern  . Not on file  Social History Narrative   Daily caffeine       [name]   [name]   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 No pets currently. No hot tub or bird exposure.    Married wife Social research officer, government   Social Determinants of Radio broadcast assistant Strain:   . Difficulty of Paying Living Expenses: Not on file  Food  Insecurity:   . Worried About Charity fundraiser in the Last Year: Not on file  . Ran Out of Food in the Last Year: Not on file  Transportation Needs:   . Lack of Transportation (Medical): Not on file  . Lack of Transportation (Non-Medical): Not on file  Physical Activity:   . Days of Exercise per Week: Not on file  . Minutes of Exercise per Session: Not on file  Stress:   . Feeling of Stress : Not on file  Social Connections:   . Frequency of Communication with Friends and Family: Not on file  . Frequency of Social Gatherings with Friends and Family: Not on file  . Attends Religious Services: Not on file  . Active Member of Clubs or Organizations: Not on file  . Attends Archivist Meetings: Not on file  . Marital Status: Not on file    Outpatient Encounter Medications as of 11/29/2019  Medication Sig  . acetaminophen (TYLENOL) 325 MG tablet Take 325 mg by mouth at bedtime.  Marland Kitchen albuterol (PROVENTIL HFA;VENTOLIN HFA) 108 (90 BASE) MCG/ACT inhaler Inhale 2 puffs into the lungs every 6 (six) hours as needed for wheezing.  Marland Kitchen ALPRAZolam (XANAX) 0.5 MG tablet TAKE 1 TO 2 TABLETS BY MOUTH EVERY DAY AS NEEDED FOR ANXIETY  . aspirin 81 MG tablet Take 81 mg by mouth 3 (three) times a week. Mondays, wednesdays and fridays  . diazepam (VALIUM) 5 MG tablet Take 1 tablet (5 mg total) by mouth at bedtime.  Marland Kitchen omeprazole (PRILOSEC) 20 MG capsule Take 1 capsule (20 mg total) by mouth daily as needed (heartburn).  . rosuvastatin  (CRESTOR) 10 MG tablet Take 1 tablet (10 mg total) by mouth daily.  . [DISCONTINUED] gabapentin (NEURONTIN) 100 MG capsule Take 1 capsule (100 mg total) by mouth at bedtime.   No facility-administered encounter medications on file as of 11/29/2019.    Activities of Daily Living In your present state of health, do you have any difficulty performing the following activities: 11/29/2019  Hearing? Y  Vision? N  Difficulty concentrating or making decisions? N  Walking or climbing stairs? N  Dressing or bathing? N  Doing errands, shopping? N  Preparing Food and eating ? N  Using the Toilet? N  In the past six months, have you accidently leaked urine? Y  Do you have problems with loss of bowel control? N  Managing your Medications? N  Managing your Finances? N  Housekeeping or managing your Housekeeping? N  Some recent data might be hidden    Patient Care Team: [name], DO as PCP - General (Geriatric Medicine) Javier Glazier, MD as Consulting Physician (Pulmonary Disease) Danella Sensing, MD as Consulting Physician (Dermatology) Jessy Oto, MD as Consulting Physician (Orthopedic Surgery) Irine Seal, MD as Attending Physician (Urology) Jackolyn Confer, MD as Consulting Physician (General Surgery) Melrose Nakayama, MD as Consulting Physician (Cardiothoracic Surgery) Rozetta Nunnery, MD as Consulting Physician (Otolaryngology) Griselda Miner, MD as Consulting Physician (Dermatology)   Assessment:   This is a routine wellness examination for Alexander [name].  Exercise Activities and Dietary recommendations Current Exercise Habits: Home exercise routine, Type of exercise: strength training/weights;stretching, Time (Minutes): 30, Frequency (Times/Week): 7, Weekly Exercise (Minutes/Week): 210, Intensity: Intense  Goals    . Maintaining physical activity     Starting 10/07/16, I will maintain my normal exercise routine daily/weekly.     . Patient Stated     Would  like to stay physically fit,  maintain relationships, and take care of himself and others.       Fall Risk Fall Risk  11/29/2019 10/14/2019 01/27/2019 12/13/2018 11/26/2018  Falls in the past year? 0 0 0 0 0  Number falls in past yr: 0 - 0 0 0  Comment - - - - -  Injury with Fall? 0 - 0 0 0  Follow up - - - - -   Is the patient's home free of loose throw rugs in walkways, pet beds, electrical cords, etc?   yes      Grab bars in the bathroom? no      Handrails on the stairs?   yes      Adequate lighting?   yes  Timed Get Up and Go Performed: na  Depression Screen PHQ 2/9 Scores 11/29/2019 01/27/2019 11/26/2018 09/16/2018  PHQ - 2 Score 0 0 0 0    Cognitive Function MMSE - Mini Mental State Exam 11/26/2018 11/20/2017 10/07/2016 11/06/2015 10/18/2014  Not completed: - - - (No Data) -  Orientation to time 5 5 5 5 5   Orientation to Place 5 5 5 5 5   Registration 3 3 3 3 3   Attention/ Calculation 5 5 5 5 5   Recall 3 2 3 3 2   Language- name 2 objects 2 2 2 2 2   Language- repeat 1 1 1 1 1   Language- follow 3 step command 3 3 3 2 3   Language- read & follow direction 1 1 1 1 1   Write a sentence 1 1 1 1 1   Copy design 1 1 1 1 1   Total score 30 29 30 29 29      6CIT Screen 11/29/2019  What Year? 0 points  What month? 0 points  What time? 0 points  Count back from 20 0 points  Months in reverse 2 points  Repeat phrase 0 points  Total Score 2    Immunization History  Administered Date(s) Administered  . Influenza Split 10/13/2013  . Influenza,inj,Quad PF,6+ Mos 10/07/2016, 09/06/2019  . Influenza-Unspecified 10/02/2014, 09/08/2015, 09/07/2018  . Pneumococcal Conjugate-13 10/16/2010, 10/02/2014  . Td 12/15/2007  . Tdap 10/01/2019    Qualifies for Shingles Vaccine? Recommended.   Screening Tests Health Maintenance  Topic Date Due  . PNA vac Low Risk Adult (2 of 2 - PPSV23) 10/04/2020 (Originally 10/03/2015)  . TETANUS/TDAP  09/30/2029  . INFLUENZA VACCINE  Completed    Cancer Screenings: Lung: Low Dose CT Chest recommended if Age 27-80 years, 30 pack-year currently smoking OR have quit w/in 15years. Patient does not qualify. Colorectal: up to date  Additional Screenings:  Hepatitis C Screening: done      Plan:     I have personally reviewed and noted the following in the patient's chart:   . Medical and social history . Use of alcohol, tobacco or illicit drugs  . Current medications and supplements . Functional ability and status . Nutritional status . Physical activity . Advanced directives . List of other physicians . Hospitalizations, surgeries, and ER visits in previous 12 months . Vitals . Screenings to include cognitive, depression, and falls . Referrals and appointments  In addition, I have reviewed and discussed with patient certain preventive protocols, quality metrics, and best practice recommendations. A written personalized care plan for preventive services as well as general preventive health recommendations were provided to patient.     Lauree Chandler, NP  11/29/2019

## 2019-11-29 NOTE — Patient Instructions (Signed)
Alexander [name] , Thank you for taking time to come for your Medicare Wellness Visit. I appreciate your ongoing commitment to your health goals. Please review the following plan we discussed and let me know if I can assist you in the future.   Screening recommendations/referrals: Colonoscopy up to date Recommended yearly ophthalmology/optometry visit for glaucoma screening and checkup Recommended yearly dental visit for hygiene and checkup  Vaccinations: Influenza vaccine up to date Pneumococcal vaccine up to date Tdap vaccine up to date Shingles vaccine RECOMMENDED to get at your local pharmacy    Advanced directives: to bring to office so we can make a copy and place on file.   Conditions/risks identified: none.  Next appointment: 1 year  Preventive Care 45 Years and Older, Male Preventive care refers to lifestyle choices and visits with your health care provider that can promote health and wellness. What does preventive care include?  A yearly physical exam. This is also called an annual well check.  Dental exams once or twice a year.  Routine eye exams. Ask your health care provider how often you should have your eyes checked.  Personal lifestyle choices, including:  Daily care of your teeth and gums.  Regular physical activity.  Eating a healthy diet.  Avoiding tobacco and drug use.  Limiting alcohol use.  Practicing safe sex.  Taking low doses of aspirin every day.  Taking vitamin and mineral supplements as recommended by your health care provider. What happens during an annual well check? The services and screenings done by your health care provider during your annual well check will depend on your age, overall health, lifestyle risk factors, and family history of disease. Counseling  Your health care provider may ask you questions about your:  Alcohol use.  Tobacco use.  Drug use.  Emotional well-being.  Home and relationship well-being.  Sexual  activity.  Eating habits.  History of falls.  Memory and ability to understand (cognition).  Work and work Statistician. Screening  You may have the following tests or measurements:  Height, weight, and BMI.  Blood pressure.  Lipid and cholesterol levels. These may be checked every 5 years, or more frequently if you are over 53 years old.  Skin check.  Lung cancer screening. You may have this screening every year starting at age 42 if you have a 30-pack-year history of smoking and currently smoke or have quit within the past 15 years.  Fecal occult blood test (FOBT) of the stool. You may have this test every year starting at age 20.  Flexible sigmoidoscopy or colonoscopy. You may have a sigmoidoscopy every 5 years or a colonoscopy every 10 years starting at age 34.  Prostate cancer screening. Recommendations will vary depending on your family history and other risks.  Hepatitis C blood test.  Hepatitis B blood test.  Sexually transmitted disease (STD) testing.  Diabetes screening. This is done by checking your blood sugar (glucose) after you have not eaten for a while (fasting). You may have this done every 1-3 years.  Abdominal aortic aneurysm (AAA) screening. You may need this if you are a current or former smoker.  Osteoporosis. You may be screened starting at age 12 if you are at high risk. Talk with your health care provider about your test results, treatment options, and if necessary, the need for more tests. Vaccines  Your health care provider may recommend certain vaccines, such as:  Influenza vaccine. This is recommended every year.  Tetanus, diphtheria, and acellular pertussis (Tdap,  Td) vaccine. You may need a Td booster every 10 years.  Zoster vaccine. You may need this after age 102.  Pneumococcal 13-valent conjugate (PCV13) vaccine. One dose is recommended after age 53.  Pneumococcal polysaccharide (PPSV23) vaccine. One dose is recommended after age  67. Talk to your health care provider about which screenings and vaccines you need and how often you need them. This information is not intended to replace advice given to you by your health care provider. Make sure you discuss any questions you have with your health care provider. Document Released: 12/21/2015 Document Revised: 08/13/2016 Document Reviewed: 09/25/2015 Elsevier Interactive Patient Education  2017 [place] Prevention in the Home Falls can cause injuries. They can happen to people of all ages. There are many things you can do to make your home safe and to help prevent falls. What can I do on the outside of my home?  Regularly fix the edges of walkways and driveways and fix any cracks.  Remove anything that might make you trip as you walk through a door, such as a raised step or threshold.  Trim any bushes or trees on the path to your home.  Use bright outdoor lighting.  Clear any walking paths of anything that might make someone trip, such as rocks or tools.  Regularly check to see if handrails are loose or broken. Make sure that both sides of any steps have handrails.  Any raised decks and porches should have guardrails on the edges.  Have any leaves, snow, or ice cleared regularly.  Use sand or salt on walking paths during winter.  Clean up any spills in your garage right away. This includes oil or grease spills. What can I do in the bathroom?  Use night lights.  Install grab bars by the toilet and in the tub and shower. Do not use towel bars as grab bars.  Use non-skid mats or decals in the tub or shower.  If you need to sit down in the shower, use a plastic, non-slip stool.  Keep the floor dry. Clean up any water that spills on the floor as soon as it happens.  Remove soap buildup in the tub or shower regularly.  Attach bath mats securely with double-sided non-slip rug tape.  Do not have throw rugs and other things on the floor that can make  you trip. What can I do in the bedroom?  Use night lights.  Make sure that you have a light by your bed that is easy to reach.  Do not use any sheets or blankets that are too big for your bed. They should not hang down onto the floor.  Have a firm chair that has side arms. You can use this for support while you get dressed.  Do not have throw rugs and other things on the floor that can make you trip. What can I do in the kitchen?  Clean up any spills right away.  Avoid walking on wet floors.  Keep items that you use a lot in easy-to-reach places.  If you need to reach something above you, use a strong step stool that has a grab bar.  Keep electrical cords out of the way.  Do not use floor polish or wax that makes floors slippery. If you must use wax, use non-skid floor wax.  Do not have throw rugs and other things on the floor that can make you trip. What can I do with my stairs?  Do not  leave any items on the stairs.  Make sure that there are handrails on both sides of the stairs and use them. Fix handrails that are broken or loose. Make sure that handrails are as long as the stairways.  Check any carpeting to make sure that it is firmly attached to the stairs. Fix any carpet that is loose or worn.  Avoid having throw rugs at the top or bottom of the stairs. If you do have throw rugs, attach them to the floor with carpet tape.  Make sure that you have a light switch at the top of the stairs and the bottom of the stairs. If you do not have them, ask someone to add them for you. What else can I do to help prevent falls?  Wear shoes that:  Do not have high heels.  Have rubber bottoms.  Are comfortable and fit you well.  Are closed at the toe. Do not wear sandals.  If you use a stepladder:  Make sure that it is fully opened. Do not climb a closed stepladder.  Make sure that both sides of the stepladder are locked into place.  Ask someone to hold it for you, if  possible.  Clearly mark and make sure that you can see:  Any grab bars or handrails.  First and last steps.  Where the edge of each step is.  Use tools that help you move around (mobility aids) if they are needed. These include:  Canes.  Walkers.  Scooters.  Crutches.  Turn on the lights when you go into a dark area. Replace any light bulbs as soon as they burn out.  Set up your furniture so you have a clear path. Avoid moving your furniture around.  If any of your floors are uneven, fix them.  If there are any pets around you, be aware of where they are.  Review your medicines with your doctor. Some medicines can make you feel dizzy. This can increase your chance of falling. Ask your doctor what other things that you can do to help prevent falls. This information is not intended to replace advice given to you by your health care provider. Make sure you discuss any questions you have with your health care provider. Document Released: 09/20/2009 Document Revised: 05/01/2016 Document Reviewed: 12/29/2014 Elsevier Interactive Patient Education  2017 [employer].

## 2019-12-07 ENCOUNTER — Other Ambulatory Visit: Payer: Self-pay | Admitting: Nurse Practitioner

## 2019-12-07 ENCOUNTER — Other Ambulatory Visit: Payer: Self-pay | Admitting: *Deleted

## 2019-12-07 DIAGNOSIS — F411 Generalized anxiety disorder: Secondary | ICD-10-CM

## 2019-12-07 DIAGNOSIS — G4700 Insomnia, unspecified: Secondary | ICD-10-CM

## 2019-12-07 MED ORDER — DIAZEPAM 5 MG PO TABS: ORAL_TABLET | ORAL | 0 refills | Status: DC

## 2019-12-07 MED ORDER — ALPRAZOLAM 0.5 MG PO TABS: ORAL_TABLET | ORAL | 0 refills | Status: DC

## 2019-12-07 NOTE — Telephone Encounter (Signed)
Last filled 10/10/2019

## 2019-12-07 NOTE — Telephone Encounter (Signed)
Patient called and stated that he needed refill on his Diazepam and Alprazolam. Stated that he takes the Diazepam 1-2 daily as needed for rest. Confirmed with [place] and pharmacy. Is this ok. Please Advise.  Pended Rx's and sent to Dr. Lyndel Safe for approval due to Dr. Mariea Clonts out of office.

## 2020-01-17 ENCOUNTER — Telehealth: Payer: Self-pay | Admitting: Interventional Cardiology

## 2020-01-17 NOTE — Telephone Encounter (Signed)
Pt received first dose of COVID vaccine on Saturday. Since then has been having palps and had some chest discomfort.  Has a hx of Afib.  Not currently on a blood thinner.  Has not had Afib since MAZE procedure in 2012.  Gets SOB when exerting. Resolves with rest.  HR was 72 today.  Chest discomfort has been easing more and more each day since the vaccine.  Denies any other issues.  States symptoms have been improving each day but still occurring.  Pt has been feeling his pulse in his wrist and says he is having pauses every 10 seconds.  Pt mentioned that he did use Albuterol and Flonase today and feels that may have contributed to his issues. Pt concerned due to pauses, SOB and pauses.  Offered pt an appt tomorrow but he states he would rather take tomorrow and see if he feels better and come in Thursday if still having symptoms.  Scheduled pt to see Daune Perch, NP on Thursday.  Advised to call sooner if any issues.  Pt very appreciative for call.

## 2020-01-17 NOTE — Telephone Encounter (Signed)
Patient c/o Palpitations:  High priority if patient c/o lightheadedness, shortness of breath, or chest pain  1) How long have you had palpitations/irregular HR/ Afib? Are you having the symptoms now? Increased since Saturday, not now  2) Are you currently experiencing lightheadedness, SOB or CP? No  3) Do you have a history of afib (atrial fibrillation) or irregular heart rhythm? Yes afib  4) Have you checked your BP or HR? (document readings if available):  HR 72   5) Are you experiencing any other symptoms? SOB when moving, comes and goes  Patient states since his covid shot on Saturday his HR has been irregular. He is unsure if this could also be related to his medication albuterol (PROVENTIL HFA;VENTOLIN HFA) 108 (90 BASE) MCG/ACT inhaler.

## 2020-01-18 ENCOUNTER — Ambulatory Visit: Payer: Medicare Other | Admitting: Cardiology

## 2020-01-19 ENCOUNTER — Ambulatory Visit: Payer: Medicare Other | Admitting: Cardiology

## 2020-01-19 ENCOUNTER — Encounter: Payer: Medicare Other | Admitting: Internal Medicine

## 2020-01-27 ENCOUNTER — Encounter: Payer: Medicare Other | Admitting: Internal Medicine

## 2020-01-30 ENCOUNTER — Ambulatory Visit (INDEPENDENT_AMBULATORY_CARE_PROVIDER_SITE_OTHER): Payer: Medicare Other | Admitting: Internal Medicine

## 2020-01-30 ENCOUNTER — Encounter: Payer: Self-pay | Admitting: Internal Medicine

## 2020-01-30 ENCOUNTER — Other Ambulatory Visit: Payer: Self-pay

## 2020-01-30 VITALS — BP 138/72 | HR 71 | Temp 97.1°F | Ht 69.0 in | Wt 192.4 lb

## 2020-01-30 DIAGNOSIS — R0602 Shortness of breath: Secondary | ICD-10-CM

## 2020-01-30 DIAGNOSIS — K862 Cyst of pancreas: Secondary | ICD-10-CM

## 2020-01-30 DIAGNOSIS — M48062 Spinal stenosis, lumbar region with neurogenic claudication: Secondary | ICD-10-CM

## 2020-01-30 DIAGNOSIS — Z0000 Encounter for general adult medical examination without abnormal findings: Secondary | ICD-10-CM | POA: Diagnosis not present

## 2020-01-30 DIAGNOSIS — R350 Frequency of micturition: Secondary | ICD-10-CM

## 2020-01-30 DIAGNOSIS — F411 Generalized anxiety disorder: Secondary | ICD-10-CM

## 2020-01-30 DIAGNOSIS — N401 Enlarged prostate with lower urinary tract symptoms: Secondary | ICD-10-CM | POA: Diagnosis not present

## 2020-01-30 NOTE — Patient Instructions (Signed)
4 weeks after your second covid vaccine, let me know if you would like me to place the order for the MRI of your pancreas.

## 2020-01-30 NOTE — [place] Notes (Signed)
Provider:  [name]. Mariea Clonts, D.O., C.M.D. Location:   [facility]  Place of Service:   clinic  Previous PCP: [name], DO Alexander [name] Care Team: [name], DO as PCP - General (Geriatric Medicine) Javier Glazier, MD as Consulting Physician (Pulmonary Disease) Danella Sensing, MD as Consulting Physician (Dermatology) Jessy Oto, MD as Consulting Physician (Orthopedic Surgery) Irine Seal, MD as Attending Physician (Urology) Jackolyn Confer, MD as Consulting Physician (General Surgery) Melrose Nakayama, MD as Consulting Physician (Cardiothoracic Surgery) Rozetta Nunnery, MD as Consulting Physician (Otolaryngology) Griselda Miner, MD as Consulting Physician (Dermatology)  Extended Emergency Contact Information Primary Emergency Contact: Alexander [name],[name] Address: [place], [place] [address] [place] Phone: [phone] Mobile Phone: [phone] Relation: Spouse  Goals of Care: Advanced Directive information Advanced Directives 01/30/2020  Does Alexander [name] Have a Medical Advance Directive? No  Type of Advance Directive -  Does Alexander [name] want to make changes to medical advance directive? -  Copy of [place] in Chart? -  Would Alexander [name] like information on creating a medical advance directive? No - Alexander [name] declined  Pre-existing out of facility DNR order (yellow form or pink MOST form) -  reports Alexander [name] has an attorney drawing up Alexander [name] living will/hcpoa soon  Chief Complaint  Alexander [name] presents with  . Annual Exam    annual physical , SOB     HPI: Alexander [name] is a 84 y.o. male seen today for an annual physical exam.  Alexander [name]'s been having shallow breathing like cleaning car windows for example.  Alexander [name]'s not sure how much is being out of shape.  Alexander [name] used to run 10-12 miles.  Alexander [name] walked for a while from May but then injured Alexander [name] finger (was all through the summer).    Walked 40 mins, then 53 mins, then 50-52 mins the  last time.  Alexander [name]'s doing a brisk walk.  The dyspnea is not with Alexander [name] walking.  Alexander [name] had rheumatic fever at 17 and yellow jaundice at 12.  Alexander [name] wonders if that affected Alexander [name] immunity.  Alexander [name] says Alexander [name]'s had some shortness of breath for years.  Says flu shots give him anaphylaxis.  Alexander [name] wonders about crestor causing.  Says the walking Alexander [name] helping him feel better.  Also does tai chi and stretching.    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  Says Alexander [name] had a long night after that and avoided using Alexander [name] albuterol.  Discussed that it's ok to take Alexander [name] albuterol as usual for the second vaccine.  Says Alexander [name] also had some palpitations after the shot.  Cardiology NP said Alexander [name] could come in for an EKG, but it's always been perfect even with the palpitations.    Alexander [name] admits to some potential deconditioning that contributes to Alexander [name] dyspnea, but this is really not new for him either.    Past Medical History:  Diagnosis Date  . Allergic rhinitis due to pollen   . Anginal pain ([place])    once in a while, none recent  . Anxiety   . Anxiety disorder   . Asthma    exercise induced  . Atrial fibrillation (HCC)    hx of  . Basal cell carcinoma of skin of lip   . Clotting disorder ([place])   . Coronary atherosclerosis of native coronary artery    with CABG 3/12  -MAZE procedure   . Diastasis recti 05/07/2016  . Difficulty urinating   .  Diverticulosis of colon (without mention of hemorrhage)    mild  . Dysuria   . Edema   . Essential hypertension, benign   . Heart attack ([place]) 2012  . Helicobacter pylori (H. pylori)   . 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 History of anal fissures   . History of cholelithiasis   . Hyperlipidemia   . Hypertrophy of prostate with urinary obstruction and other lower urinary tract symptoms (LUTS)   . Impotence of organic origin   . Internal hemorrhoids without mention of complication   . Jaundice age 29  . Long term (current) use of anticoagulants   . Neutropenia ([place]) 10/07/2016  .  Nonspecific elevation of levels of transaminase or lactic acid dehydrogenase (LDH)   . Other voice and resonance disorders    after surgery, took breathing tube out and damaged something  . Pneumonia   . Pulmonary embolism ([place]) 2012   s/p heart surgery  . Pulmonary embolism ([place])    3/12  . Rheumatic fever age 33  . Skin cancer    Hx of squamous cell x2  . Sleep apnea    no CPAP  . Unspecified constipation    Past Surgical History:  Procedure Laterality Date  . CARDIAC CATHETERIZATION    . CHOLECYSTECTOMY N/A 11/18/2013   Procedure: LAPAROSCOPIC CHOLECYSTECTOMY With IOC;  Surgeon: Odis Hollingshead, MD;  Location: [facility];  Service: General;  Laterality: N/A;  . CORONARY ARTERY BYPASS GRAFT  2012   Median sternotomy, extracorporeal circulation, coronary  . HERNIA REPAIR  2010   bilateral  . HYDROCELE EXCISION     dr [name]   . MOHS SURGERY     squamus x2; BCC fo bridge of nose June 2017.  Marland Kitchen SCROTUM EXPLORATION  1990's   multiple  . SPERMATOCELECTOMY  02/1999   Right, Dr.[name]  . SUPERFICIAL SKIN CYSTECTOMY (l) HIP Right 1997   dr [name]  . VASECTOMY  04/1992    reports that Alexander [name] quit smoking about 42 years ago. Alexander [name] smoking use included cigarettes. Alexander [name] started smoking about 63 years ago. Alexander [name] has a 22.00 pack-year smoking history. Alexander [name] has never used smokeless tobacco. Alexander [name] reports current alcohol use. Alexander [name] reports that Alexander [name] does not use drugs.  Functional Status Survey:    Family History  Problem Relation Age of Onset  . Bone cancer Mother   . Diabetes Mother   . Colon cancer Neg Hx   . Esophageal cancer Neg Hx   . Rectal cancer Neg Hx   . Stomach cancer Neg Hx   . Lung disease Neg Hx     Health Maintenance  Topic Date Due  . PNA vac Low Risk Adult (2 of 2 - PPSV23) 10/04/2020 (Originally 10/03/2015)  . TETANUS/TDAP  09/30/2029  . INFLUENZA VACCINE  Completed    Allergies  Allergen Reactions  . Heparin     Blood clots   . Penicillin G Shortness Of Breath  . Levaquin  [Levofloxacin In D5w] Other (See Comments)    Alexander [name] felt jittery and nervous with insomnia on the medication.  . Aspirin     Breathing complications, can tolerate baby asa  . Codeine     Some is ok  . Sucralfate     Could not sleep  . Zetia [Ezetimibe]     jittery  . Zocor [Simvastatin]     Liver enzymes increased  . Plavix [Clopidogrel Bisulfate] Palpitations    Outpatient Encounter Medications as of 01/30/2020  Medication  Sig  . acetaminophen (TYLENOL) 325 MG tablet Take 325 mg by mouth at bedtime.  Marland Kitchen albuterol (PROVENTIL HFA;VENTOLIN HFA) 108 (90 BASE) MCG/ACT inhaler Inhale 2 puffs into the lungs every 6 (six) hours as needed for wheezing.  Marland Kitchen ALPRAZolam (XANAX) 0.5 MG tablet Take one to two tablets by mouth once daily as needed for anxiety  . aspirin 81 MG tablet Take 81 mg by mouth 3 (three) times a week. Mondays, wednesdays and fridays  . diazepam (VALIUM) 5 MG tablet Take one to two tablets by mouth at bedtime as needed for rest.  . omeprazole (PRILOSEC) 20 MG capsule Take 1 capsule (20 mg total) by mouth daily as needed (heartburn).  . rosuvastatin (CRESTOR) 10 MG tablet Take 1 tablet (10 mg total) by mouth daily.   No facility-administered encounter medications on file as of 01/30/2020.    Review of Systems  Constitutional: Negative for chills, fever and malaise/fatigue.  HENT: Positive for hearing loss. Negative for congestion and sore throat.        20% disability from [place] for hearing; has hearing aid Alexander [name] may start using soon  Eyes: Negative for blurred vision.  Respiratory: Negative for cough and shortness of breath.   Cardiovascular: Positive for palpitations. Negative for chest pain, orthopnea, leg swelling and PND.  Gastrointestinal: Negative for abdominal pain, blood in stool, constipation and melena.  Genitourinary: Positive for frequency. Negative for dysuria and urgency.  Musculoskeletal: Positive for back pain. Negative for falls and joint pain.  Skin: Negative  for itching and rash.  Neurological: Positive for sensory change. Negative for dizziness and loss of consciousness.       Had some transient ulnar neuropathy in both hands  Endo/Heme/Allergies: Bruises/bleeds easily.  Psychiatric/Behavioral: Negative for depression and memory loss. The Alexander [name] is nervous/anxious and has insomnia.     Vitals:   01/30/20 1118  BP: 138/72  Pulse: 71  Temp: (!) 97.1 F (36.2 C)  SpO2: 96%  Weight: 192 lb 6.4 oz (87.3 kg)  Height: '5\' 9"'  (1.753 m)   Body mass index is 28.41 kg/m. Physical Exam Vitals reviewed.  Constitutional:      General: Alexander [name] is not in acute distress.    Appearance: Normal appearance. Alexander [name] is not ill-appearing or toxic-appearing.  HENT:     Head: Normocephalic and atraumatic.     Right Ear: Tympanic membrane, ear canal and external ear normal.     Left Ear: Tympanic membrane, ear canal and external ear normal.     Ears:     Comments: Mild HOH    Nose: Congestion present.  Eyes:     Extraocular Movements: Extraocular movements intact.     Conjunctiva/sclera: Conjunctivae normal.     Pupils: Pupils are equal, round, and reactive to light.  Cardiovascular:     Rate and Rhythm: Normal rate and regular rhythm.     Pulses: Normal pulses.     Heart sounds: Normal heart sounds.  Pulmonary:     Effort: Pulmonary effort is normal.     Breath sounds: Normal breath sounds. No wheezing, rhonchi or rales.  Abdominal:     General: Bowel sounds are normal. There is no distension.     Palpations: Abdomen is soft.     Tenderness: There is no abdominal tenderness. There is no guarding or rebound.  Musculoskeletal:        General: Normal range of motion.     Cervical back: Normal range of motion and neck supple.  Right lower leg: No edema.     Left lower leg: No edema.  Lymphadenopathy:     Cervical: No cervical adenopathy.  Skin:    General: Skin is warm and dry.     Capillary Refill: Capillary refill takes less than 2 seconds.    Neurological:     General: No focal deficit present.     Mental Status: Alexander [name] is alert and oriented to person, place, and time. Mental status is at baseline.     Cranial Nerves: No cranial nerve deficit.     Sensory: No sensory deficit.     Motor: No weakness.     Coordination: Coordination normal.     Gait: Gait normal.     Deep Tendon Reflexes: Reflexes normal.  Psychiatric:        Mood and Affect: Mood normal.     Labs reviewed: Basic Metabolic Panel: Recent Labs    10/26/19 0935  NA 141  K 4.8  CL 102  CO2 31  GLUCOSE 103*  BUN 14  CREATININE 1.03  CALCIUM 9.4   Liver Function Tests: Recent Labs    10/26/19 0935  AST 28  ALT 26  BILITOT 1.4*  PROT 6.8   No results for input(s): LIPASE, AMYLASE in the last 8760 hours. No results for input(s): AMMONIA in the last 8760 hours. CBC: Recent Labs    10/26/19 0935  WBC 7.0  NEUTROABS 4,424  HGB 16.5  HCT 48.3  MCV 93.1  PLT 238   Cardiac Enzymes: No results for input(s): CKTOTAL, CKMB, CKMBINDEX, TROPONINI in the last 8760 hours. BNP: Invalid input(s): POCBNP Lab Results  Component Value Date   HGBA1C 5.8 (H) 10/07/2016   Lab Results  Component Value Date   TSH 3.340 10/17/2014   No results found for: VITAMINB12 No results found for: FOLATE No results found for: IRON, TIBC, FERRITIN  Assessment/Plan 1. Annual physical exam -performed today, doing well, up to date except for need for second covid vaccine which is coming up and f/u pancreatic cyst mri (was due in June and Alexander [name] deferred this due to covid)  2. Shortness of breath -not entirely new, made worse when Alexander [name] takes off from exercise for other ailments like Alexander [name] did for Alexander [name] finger injury -now back to walking and doing well-encouraged this for Alexander [name] physical and mental wellbeing  3. Benign prostatic hyperplasia with urinary frequency -no increase in symptoms, PSA mildly up, but not considered remarkable  4. Pancreas cyst -Alexander [name] will call me when Alexander [name]'s  4 wks out from Alexander [name] second covid vaccine if Alexander [name] feels ready to come into the imaging center for Alexander [name] f/u MRI to ensure no change since 6/18  5. Spinal stenosis of lumbar region with neurogenic claudication -stable, cont walking and stretching routine plus some balance exercises  6. Anxiety state -cont longstanding benzo regimen, has some PTSD from [place] days and is followed at [place], as well  Labs/tests ordered:  No new today;  Full labs were done in Nov  F/u 6 mos med mgt  Arianny Pun L. Araeya Lamb, D.O. [facility] [address],  [address] Cell Phone (Mon-Fri 8am-5pm):  [phone] On Call:  [phone] & follow prompts after 5pm & weekends Office Phone:  [phone] Office Fax:  [phone]

## 2020-02-10 ENCOUNTER — Other Ambulatory Visit: Payer: Self-pay | Admitting: Internal Medicine

## 2020-02-10 DIAGNOSIS — G4700 Insomnia, unspecified: Secondary | ICD-10-CM

## 2020-04-12 ENCOUNTER — Other Ambulatory Visit: Payer: Self-pay | Admitting: Internal Medicine

## 2020-04-12 DIAGNOSIS — G4700 Insomnia, unspecified: Secondary | ICD-10-CM

## 2020-04-12 DIAGNOSIS — F411 Generalized anxiety disorder: Secondary | ICD-10-CM

## 2020-04-12 NOTE — Telephone Encounter (Signed)
Last filled 02/10/2020 in epic, pending appointment in August to update non-opioid treatment agreement

## 2020-04-12 NOTE — Telephone Encounter (Signed)
Last filled 12/06/2020 in Epic.   Pending appointment in August to complete non-opioid treatment agreement.

## 2020-06-25 ENCOUNTER — Telehealth: Payer: Self-pay | Admitting: *Deleted

## 2020-06-25 DIAGNOSIS — R350 Frequency of micturition: Secondary | ICD-10-CM

## 2020-06-25 NOTE — Telephone Encounter (Signed)
Patient called and stated that he has an appointment with Dr. Mariea Clonts on 07/30/20. Patient is wanting to know if he can have his PSA rechecked before the appointment since it was high (2.2). Patient understands that it is abnormal when it is 4 or over but wants it checked.  Please Advise.

## 2020-06-25 NOTE — Telephone Encounter (Signed)
Ok to check psa before next visit.

## 2020-06-26 NOTE — Telephone Encounter (Signed)
Tried calling patient and number just rings.  Will try again later.

## 2020-06-26 NOTE — Telephone Encounter (Signed)
LMOM to return call.

## 2020-06-27 NOTE — Telephone Encounter (Signed)
Patient notified and agreed. Patient scheduled a lab appointment for 07/25/20. Lab order placed.

## 2020-07-23 ENCOUNTER — Ambulatory Visit (INDEPENDENT_AMBULATORY_CARE_PROVIDER_SITE_OTHER): Payer: Medicare Other | Admitting: Otolaryngology

## 2020-07-25 ENCOUNTER — Other Ambulatory Visit: Payer: Medicare Other

## 2020-07-25 ENCOUNTER — Other Ambulatory Visit: Payer: Self-pay

## 2020-07-25 DIAGNOSIS — N401 Enlarged prostate with lower urinary tract symptoms: Secondary | ICD-10-CM

## 2020-07-25 DIAGNOSIS — R350 Frequency of micturition: Secondary | ICD-10-CM | POA: Diagnosis not present

## 2020-07-25 LAB — PSA: PSA: 1.8 ng/mL (ref <=4.0)

## 2020-07-26 NOTE — [place] Notes (Signed)
PSA has trended back down (though not at a worrisome level from my perspective before) which is great news.

## 2020-07-30 ENCOUNTER — Other Ambulatory Visit: Payer: Self-pay

## 2020-07-30 ENCOUNTER — Encounter: Payer: Self-pay | Admitting: Internal Medicine

## 2020-07-30 ENCOUNTER — Ambulatory Visit (INDEPENDENT_AMBULATORY_CARE_PROVIDER_SITE_OTHER): Payer: Medicare Other | Admitting: Internal Medicine

## 2020-07-30 VITALS — BP 138/76 | HR 65 | Temp 97.5°F | Ht 69.0 in | Wt 183.4 lb

## 2020-07-30 DIAGNOSIS — D692 Other nonthrombocytopenic purpura: Secondary | ICD-10-CM | POA: Diagnosis not present

## 2020-07-30 DIAGNOSIS — I10 Essential (primary) hypertension: Secondary | ICD-10-CM

## 2020-07-30 DIAGNOSIS — N401 Enlarged prostate with lower urinary tract symptoms: Secondary | ICD-10-CM | POA: Diagnosis not present

## 2020-07-30 DIAGNOSIS — I209 Angina pectoris, unspecified: Secondary | ICD-10-CM

## 2020-07-30 DIAGNOSIS — E7849 Other hyperlipidemia: Secondary | ICD-10-CM | POA: Diagnosis not present

## 2020-07-30 DIAGNOSIS — Z23 Encounter for immunization: Secondary | ICD-10-CM

## 2020-07-30 DIAGNOSIS — F411 Generalized anxiety disorder: Secondary | ICD-10-CM

## 2020-07-30 DIAGNOSIS — R350 Frequency of micturition: Secondary | ICD-10-CM

## 2020-07-30 NOTE — Progress Notes (Signed)
Location:  [facility] clinic Provider:  Claire Bridge L. Mariea Clonts, D.O., C.M.D.  Goals of Care:  Advanced Directives 07/30/2020  Does Patient Have a Medical Advance Directive? No  Type of Advance Directive -  Does patient want to make changes to medical advance directive? -  Copy of [place] in Chart? -  Would patient like information on creating a medical advance directive? Yes (ED - Information included in AVS)  Pre-existing out of facility DNR order (yellow form or pink MOST form) -     Chief Complaint  Patient presents with  . Medical Management of Chronic Issues    6 month follow   . Health Maintenance    Influenza high dose is not in stock   . Acute Visit    urination question, fatique and rash on collar bone rt side     HPI: Patient is a 84 y.o. male seen today for medical management of chronic diseases.    He reports he's always been prone to fatigue.  He's always had some of this even 30-40 yrs ago.  He was climbing, running, stretching with his back, then fast walking.  He'd been wearing a weight belt around the house.  He decided he had one shot staying in shape.  Around April 1st he started to walk 3-3.5 miles brisk walking 6 days per week.  He enjoys it.  He rarely uses a weight belt now.  It started bothering his hips.  Legs are much stronger now.  Nancy Fetter feels good even if warm in the mornings.  Stretches 2/3 way through.  Also does dips.   Hips are only rarely bothering him now at night.  He's 179-180 lbs at home.  Appetite is fair.  Might have a glass of wine which improves it.  Had to change his diet to avoid fat.  Wants to check his cholesterol.    PSA had been up to 2.2 and went down to 1.8.  He does urinate too often.  He will be 84 next month.  He's not ready for the urolift.  Says flomax makes his heart pound harder.  He is now to be on terazosin 1mg .    Has his chronic sleep problems.  Was taking 2/3 of a regular tylenol at night.  He does it once in a  while.  He felt like it was upsetting his stomach some.    Had an episode of angina about 2 weeks ago that lasted 2.5 mins in his central chest and radiated to his jaw.  It was intense.  Did resolve completely.  Happened when sitting in the heat on his porch.    Past Medical History:  Diagnosis Date  . Allergic rhinitis due to pollen   . Anginal pain ([place])    once in a while, none recent  . Anxiety   . Anxiety disorder   . Asthma    exercise induced  . Atrial fibrillation (HCC)    hx of  . Basal cell carcinoma of skin of lip   . Clotting disorder ([place])   . Coronary atherosclerosis of native coronary artery    with CABG 3/12  -MAZE procedure   . Diastasis recti 05/07/2016  . Difficulty urinating   . Diverticulosis of colon (without mention of hemorrhage)    mild  . Dysuria   . Edema   . Essential hypertension, benign   . Heart attack ([place]) 2012  . Helicobacter pylori (H. pylori)   . 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 History of anal fissures   . History of cholelithiasis   . Hyperlipidemia   . Hypertrophy of prostate with urinary obstruction and other lower urinary tract symptoms (LUTS)   . Impotence of organic origin   . Internal hemorrhoids without mention of complication   . Jaundice age 70  . Long term (current) use of anticoagulants   . Neutropenia ([place]) 10/07/2016  . Nonspecific elevation of levels of transaminase or lactic acid dehydrogenase (LDH)   . Other voice and resonance disorders    after surgery, took breathing tube out and damaged something  . Pneumonia   . Pulmonary embolism ([place]) 2012   s/p heart surgery  . Pulmonary embolism ([place])    3/12  . Rheumatic fever age 35  . Skin cancer    Hx of squamous cell x2  . Sleep apnea    no CPAP  . Unspecified constipation     Past Surgical History:  Procedure Laterality Date  . CARDIAC CATHETERIZATION    . CHOLECYSTECTOMY N/A 11/18/2013   Procedure: LAPAROSCOPIC CHOLECYSTECTOMY With IOC;   Surgeon: Odis Hollingshead, MD;  Location: [facility];  Service: General;  Laterality: N/A;  . CORONARY ARTERY BYPASS GRAFT  2012   Median sternotomy, extracorporeal circulation, coronary  . HERNIA REPAIR  2010   bilateral  . HYDROCELE EXCISION     dr [name]   . MOHS SURGERY     squamus x2; BCC fo bridge of nose June 2017.  Marland Kitchen SCROTUM EXPLORATION  1990's   multiple  . SPERMATOCELECTOMY  02/1999   Right, Dr.[name]  . SUPERFICIAL SKIN CYSTECTOMY (l) HIP Right 1997   dr [name]  . VASECTOMY  04/1992    Allergies  Allergen Reactions  . Heparin     Blood clots   . Penicillin G Shortness Of Breath  . Levaquin [Levofloxacin In D5w] Other (See Comments)    Patient felt jittery and nervous with insomnia on the medication.  . Aspirin     Breathing complications, can tolerate baby asa  . Codeine     Some is ok  . Sucralfate     Could not sleep  . Zetia [Ezetimibe]     jittery  . Zocor [Simvastatin]     Liver enzymes increased  . Plavix [Clopidogrel Bisulfate] Palpitations    Outpatient Encounter Medications as of 07/30/2020  Medication Sig  . acetaminophen (TYLENOL) 325 MG tablet Take 325 mg by mouth at bedtime.  Marland Kitchen albuterol (PROVENTIL HFA;VENTOLIN HFA) 108 (90 BASE) MCG/ACT inhaler Inhale 2 puffs into the lungs every 6 (six) hours as needed for wheezing.  Marland Kitchen ALPRAZolam (XANAX) 0.5 MG tablet TAKE 1 TO 2 TABLETS BY MOUTH EVERY DAY AS NEEDED FOR ANXIETY  . aspirin 81 MG tablet Take 81 mg by mouth 3 (three) times a week. Mondays, wednesdays and fridays  . diazepam (VALIUM) 5 MG tablet TAKE 1 TO 2 TABLETS BY MOUTH AT BEDTIME AS NEEDED FOR REST  . omeprazole (PRILOSEC) 20 MG capsule Take 1 capsule (20 mg total) by mouth daily as needed (heartburn).  . rosuvastatin (CRESTOR) 10 MG tablet Take 1 tablet (10 mg total) by mouth daily.   No facility-administered encounter medications on file as of 07/30/2020.    Review of Systems:  Review of Systems  Constitutional: Positive for malaise/fatigue  and weight loss. Negative for chills and fever.  HENT: Negative for congestion.   Eyes: Negative for blurred vision.  Respiratory: Negative for cough and  shortness of breath.   Cardiovascular: Negative for chest pain, palpitations and leg swelling.  Gastrointestinal: Negative for abdominal pain, blood in stool, constipation, diarrhea and melena.  Genitourinary: Negative for dysuria.  Musculoskeletal: Positive for back pain. Negative for falls.  Skin: Positive for itching.       Right shoulder insect bite  Neurological: Negative for dizziness and loss of consciousness.  Endo/Heme/Allergies: Bruises/bleeds easily.  Psychiatric/Behavioral: Negative for depression and memory loss.    Health Maintenance  Topic Date Due  . INFLUENZA VACCINE  07/08/2020  . PNA vac Low Risk Adult (2 of 2 - PPSV23) 10/04/2020 (Originally 10/03/2015)  . TETANUS/TDAP  09/30/2029  . COVID-19 Vaccine  Completed    Physical Exam: Vitals:   07/30/20 1116  BP: 138/76  Pulse: 65  Temp: (!) 97.5 F (36.4 C)  TempSrc: Temporal  SpO2: 98%  Weight: 183 lb 6.4 oz (83.2 kg)  Height: 5\' 9"  (1.753 m)   Body mass index is 27.08 kg/m. Physical Exam Vitals reviewed.  Constitutional:      Appearance: Normal appearance.  HENT:     Head: Normocephalic and atraumatic.  Cardiovascular:     Rate and Rhythm: Normal rate and regular rhythm.     Pulses: Normal pulses.     Heart sounds: Normal heart sounds.  Pulmonary:     Effort: Pulmonary effort is normal.     Breath sounds: Normal breath sounds. No wheezing, rhonchi or rales.  Abdominal:     General: Bowel sounds are normal.     Tenderness: There is no abdominal tenderness. There is no guarding or rebound.  Musculoskeletal:        General: Normal range of motion.     Cervical back: Neck supple.     Right lower leg: No edema.     Left lower leg: No edema.  Lymphadenopathy:     Cervical: No cervical adenopathy.  Skin:    General: Skin is warm and dry.      Comments: Right shoulder small papule with evidence of excoriation  Neurological:     General: No focal deficit present.     Mental Status: He is alert and oriented to person, place, and time.     Cranial Nerves: No cranial nerve deficit.     Motor: No weakness.     Gait: Gait normal.  Psychiatric:        Mood and Affect: Mood normal.        Behavior: Behavior normal.     Labs reviewed: Basic Metabolic Panel: Recent Labs    10/26/19 0935  NA 141  K 4.8  CL 102  CO2 31  GLUCOSE 103*  BUN 14  CREATININE 1.03  CALCIUM 9.4   Liver Function Tests: Recent Labs    10/26/19 0935  AST 28  ALT 26  BILITOT 1.4*  PROT 6.8   No results for input(s): LIPASE, AMYLASE in the last 8760 hours. No results for input(s): AMMONIA in the last 8760 hours. CBC: Recent Labs    10/26/19 0935  WBC 7.0  NEUTROABS 4,424  HGB 16.5  HCT 48.3  MCV 93.1  PLT 238   Lipid Panel: Recent Labs    10/26/19 0935  CHOL 155  HDL 56  LDLCALC 84  TRIG 73  CHOLHDL 2.8   Lab Results  Component Value Date   HGBA1C 5.8 (H) 10/07/2016    Assessment/Plan 1. Benign prostatic hyperplasia with urinary frequency -symptoms have worsened, but he is holding off on surgery due  to concerns about losing his erectile function -he's been on flomax and will now start 1mg  terazosin from urology after we discussed it--should stand slowly in case it does drop his bp, continue to hydrate well  2. Senile purpura (HCC) -continues to have easy bruising due to advancing age with thinning skin  3. Angina pectoris ([place]) -one episode a couple of months ago that sounded more unstable given he was at rest, it was fortunately very short in duration, no further difficulty, walking regularly w/o symptoms  4. Other hyperlipidemia -cont crestor, f/u lab before CPE - CBC with Differential/Platelet; Future - COMPLETE METABOLIC PANEL WITH GFR; Future - Lipid panel; Future  5. Anxiety state -continues on his regular  valium and xanax for this, some is related to PTSD  6. Essential hypertension - bp at goal, cont same regimen - COMPLETE METABOLIC PANEL WITH GFR; Future  7. Need for influenza vaccination -he wants to wait another month and get the regular dosage NOT the high dose  Labs/tests ordered:  * No order type specified * Next appt:  Visit date not found   Darika Ildefonso L. Shaylene Paganelli, D.O. [facility] [address], [place] [address] Cell Phone (Mon-Fri 8am-5pm):  [phone] On Call:  [phone] & follow prompts after 5pm & weekends Office Phone:  [phone] Office Fax:  [phone]

## 2020-08-08 DIAGNOSIS — Z85828 Personal history of other malignant neoplasm of skin: Secondary | ICD-10-CM | POA: Diagnosis not present

## 2020-08-08 DIAGNOSIS — L570 Actinic keratosis: Secondary | ICD-10-CM | POA: Diagnosis not present

## 2020-08-08 DIAGNOSIS — D225 Melanocytic nevi of trunk: Secondary | ICD-10-CM | POA: Diagnosis not present

## 2020-08-08 DIAGNOSIS — W57XXXA Bitten or stung by nonvenomous insect and other nonvenomous arthropods, initial encounter: Secondary | ICD-10-CM | POA: Diagnosis not present

## 2020-08-08 DIAGNOSIS — L821 Other seborrheic keratosis: Secondary | ICD-10-CM | POA: Diagnosis not present

## 2020-08-23 DIAGNOSIS — L820 Inflamed seborrheic keratosis: Secondary | ICD-10-CM | POA: Diagnosis not present

## 2020-08-23 DIAGNOSIS — D485 Neoplasm of uncertain behavior of skin: Secondary | ICD-10-CM | POA: Diagnosis not present

## 2020-09-27 ENCOUNTER — Ambulatory Visit: Payer: Medicare Other | Attending: Internal Medicine

## 2020-09-27 DIAGNOSIS — Z23 Encounter for immunization: Secondary | ICD-10-CM

## 2020-09-27 NOTE — Progress Notes (Signed)
   Covid-19 Vaccination Clinic  Name:  VIN YONKE    MRN: [id number] DOB: 13-May-1936  09/27/2020  Mr. Olgin was observed post Covid-19 immunization for 15 minutes without incident. He was provided with Vaccine Information Sheet and instruction to access the V-Safe system.   Mr. Leaf was instructed to call 911 with any severe reactions post vaccine: Marland Kitchen Difficulty breathing  . Swelling of face and throat  . A fast heartbeat  . A bad rash all over body  . Dizziness and weakness

## 2020-10-09 ENCOUNTER — Telehealth: Payer: Self-pay | Admitting: Interventional Cardiology

## 2020-10-09 NOTE — Telephone Encounter (Signed)
Patient called and is looking for an earlier appt.

## 2020-10-09 NOTE — Telephone Encounter (Signed)
Scheduled the patient for overdue visit in February 2022 as he has no complaints at this time.  He understands he will be placed on the waiting list.

## 2020-10-22 ENCOUNTER — Encounter: Payer: Self-pay | Admitting: Internal Medicine

## 2020-10-22 ENCOUNTER — Ambulatory Visit (INDEPENDENT_AMBULATORY_CARE_PROVIDER_SITE_OTHER): Payer: Medicare Other | Admitting: Internal Medicine

## 2020-10-22 ENCOUNTER — Other Ambulatory Visit: Payer: Self-pay

## 2020-10-22 VITALS — BP 138/72 | HR 64 | Temp 97.5°F | Wt 188.0 lb

## 2020-10-22 DIAGNOSIS — J342 Deviated nasal septum: Secondary | ICD-10-CM | POA: Diagnosis not present

## 2020-10-22 DIAGNOSIS — J320 Chronic maxillary sinusitis: Secondary | ICD-10-CM | POA: Diagnosis not present

## 2020-10-22 DIAGNOSIS — Z23 Encounter for immunization: Secondary | ICD-10-CM | POA: Diagnosis not present

## 2020-10-22 DIAGNOSIS — H9319 Tinnitus, unspecified ear: Secondary | ICD-10-CM | POA: Diagnosis not present

## 2020-10-22 DIAGNOSIS — E7849 Other hyperlipidemia: Secondary | ICD-10-CM

## 2020-10-22 DIAGNOSIS — R040 Epistaxis: Secondary | ICD-10-CM

## 2020-10-22 NOTE — Progress Notes (Signed)
Location:  [facility] clinic Provider:  Jacarra Bobak L. Mariea Clonts, D.O., C.M.D.  Goals of Care:  Advanced Directives 10/22/2020  Does Patient Have a Medical Advance Directive? No  Type of Advance Directive -  Does patient want to make changes to medical advance directive? -  Copy of [place] in Chart? -  Would patient like information on creating a medical advance directive? Yes (ED - Information included in AVS)  Pre-existing out of facility DNR order (yellow form or pink MOST form) -   Chief Complaint  Patient presents with  . Acute Visit    possible sinus infection     HPI: Patient is a 84 y.o. [name] seen today for acute visit for possible sinus infection.  Friday with the cold snap, it irritated his sinuses.  He's always had allergies (had shots 30 yrs ago), deviated septum (thinks it's the right) and it's irritated and doesn't want it to get ulcerated.  He gets pressure in there.  He generally lives with it.  He went for a 5k walk Friday and was preparing himself a meal.  He then had warm oozing down the right side of his nose--a good bit of blood running down his nose--used up a couple of towels.  Occasionally would have touches of blood in some yellow mucus.  He considered going to urgent care, but waited 10-15 mis and it stopped.  Sat and Sun, he felt a buildup of crust.  Then it came out with a little string of blood.  Then he felt better in there.  Feels a lot better now, but a little raw in there.  Wonders if needs antibiotic.  He doesn't want to take them unless he needs it.    He notes that after his last covid shot, he had a coarse whistle different from his baseline tinnitus.    Past Medical History:  Diagnosis Date  . Allergic rhinitis due to pollen   . Anginal pain ([place])    once in a while, none recent  . Anxiety   . Anxiety disorder   . Asthma    exercise induced  . Atrial fibrillation (HCC)    hx of  . Basal cell carcinoma of skin of lip   . Clotting  disorder ([place])   . Coronary atherosclerosis of native coronary artery    with CABG 3/12  -MAZE procedure   . Diastasis recti 05/07/2016  . Difficulty urinating   . Diverticulosis of colon (without mention of hemorrhage)    mild  . Dysuria   . Edema   . Essential hypertension, benign   . Heart attack ([place]) 2012  . Helicobacter pylori (H. pylori)   . Heparin induced thrombocytopenia ([place])    3/12  . History of anal fissures   . History of cholelithiasis   . Hyperlipidemia   . Hypertrophy of prostate with urinary obstruction and other lower urinary tract symptoms (LUTS)   . Impotence of organic origin   . Internal hemorrhoids without mention of complication   . Jaundice age 66  . Long term (current) use of anticoagulants   . Neutropenia ([place]) 10/07/2016  . Nonspecific elevation of levels of transaminase or lactic acid dehydrogenase (LDH)   . Other voice and resonance disorders    after surgery, took breathing tube out and damaged something  . Pneumonia   . Pulmonary embolism ([place]) 2012   s/p heart surgery  . Pulmonary embolism ([place])    3/12  . Rheumatic fever age  17  . Skin cancer    Hx of squamous cell x2  . Sleep apnea    no CPAP  . Unspecified constipation     Past Surgical History:  Procedure Laterality Date  . CARDIAC CATHETERIZATION    . CHOLECYSTECTOMY N/A 11/18/2013   Procedure: LAPAROSCOPIC CHOLECYSTECTOMY With IOC;  Surgeon: Odis Hollingshead, MD;  Location: [facility];  Service: General;  Laterality: N/A;  . CORONARY ARTERY BYPASS GRAFT  2012   Median sternotomy, extracorporeal circulation, coronary  . HERNIA REPAIR  2010   bilateral  . HYDROCELE EXCISION     dr [name]   . MOHS SURGERY     squamus x2; BCC fo bridge of nose June 2017.  Marland Kitchen SCROTUM EXPLORATION  1990's   multiple  . SPERMATOCELECTOMY  02/1999   Right, Dr.[name]  . SUPERFICIAL SKIN CYSTECTOMY (l) HIP Right 1997   dr [name]  . VASECTOMY  04/1992    Allergies  Allergen Reactions  . Heparin      Blood clots   . Penicillin G Shortness Of Breath  . Levaquin [Levofloxacin In D5w] Other (See Comments)    Patient felt jittery and nervous with insomnia on the medication.  . Aspirin     Breathing complications, can tolerate baby asa  . Codeine     Some is ok  . Sucralfate     Could not sleep  . Zetia [Ezetimibe]     jittery  . Zocor [Simvastatin]     Liver enzymes increased  . Plavix [Clopidogrel Bisulfate] Palpitations    Outpatient Encounter Medications as of 10/22/2020  Medication Sig  . acetaminophen (TYLENOL) 325 MG tablet Take 325 mg by mouth at bedtime.  Marland Kitchen albuterol (PROVENTIL HFA;VENTOLIN HFA) 108 (90 BASE) MCG/ACT inhaler Inhale 2 puffs into the lungs every 6 (six) hours as needed for wheezing.  Marland Kitchen ALPRAZolam (XANAX) 0.5 MG tablet TAKE 1 TO 2 TABLETS BY MOUTH EVERY DAY AS NEEDED FOR ANXIETY  . aspirin 81 MG tablet Take 81 mg by mouth 3 (three) times a week. Mondays, wednesdays and fridays  . diazepam (VALIUM) 5 MG tablet TAKE 1 TO 2 TABLETS BY MOUTH AT BEDTIME AS NEEDED FOR REST  . rosuvastatin (CRESTOR) 10 MG tablet Take 1 tablet (10 mg total) by mouth daily.  . [DISCONTINUED] omeprazole (PRILOSEC) 20 MG capsule Take 1 capsule (20 mg total) by mouth daily as needed (heartburn).   No facility-administered encounter medications on file as of 10/22/2020.    Review of Systems:  Review of Systems  Constitutional: Negative for chills and fever.  HENT: Positive for congestion, ear pain and sinus pain. Negative for sore throat.   Respiratory: Negative for cough, shortness of breath and wheezing.   Cardiovascular: Negative for chest pain.  Gastrointestinal: Negative for abdominal pain.  Genitourinary: Negative for dysuria.  Musculoskeletal: Negative for falls.  Neurological: Negative for dizziness and loss of consciousness.  Endo/Heme/Allergies: Positive for environmental allergies.  Psychiatric/Behavioral: Negative for memory loss. The patient is nervous/anxious and has  insomnia.     Health Maintenance  Topic Date Due  . PNA vac Low Risk Adult (2 of 2 - PPSV23) 10/03/2015  . INFLUENZA VACCINE  07/08/2020  . TETANUS/TDAP  09/30/2029  . COVID-19 Vaccine  Completed    Physical Exam: Vitals:   10/22/20 0831  BP: 138/72  Pulse: 64  Temp: (!) 97.5 F (36.4 C)  SpO2: 97%  Weight: 188 lb (85.3 kg)   Body mass index is 27.76 kg/m. Physical Exam Vitals  reviewed.  Constitutional:      General: He is not in acute distress.    Appearance: Normal appearance. He is not toxic-appearing.  HENT:     Head: Normocephalic and atraumatic.     Right Ear: Tympanic membrane, ear canal and external ear normal.     Left Ear: Tympanic membrane, ear canal and external ear normal.     Nose: No congestion.     Mouth/Throat:     Pharynx: Oropharynx is clear.  Cardiovascular:     Rate and Rhythm: Normal rate and regular rhythm.  Pulmonary:     Effort: Pulmonary effort is normal.     Breath sounds: Normal breath sounds. No rales.  Musculoskeletal:        General: Normal range of motion.  Neurological:     General: No focal deficit present.     Mental Status: He is alert and oriented to person, place, and time. Mental status is at baseline.  Psychiatric:        Mood and Affect: Mood normal.        Behavior: Behavior normal.     Labs reviewed: Basic Metabolic Panel: Recent Labs    10/26/19 0935  NA 141  K 4.8  CL 102  CO2 31  GLUCOSE 103*  BUN 14  CREATININE 1.03  CALCIUM 9.4   Liver Function Tests: Recent Labs    10/26/19 0935  AST 28  ALT 26  BILITOT 1.4*  PROT 6.8   No results for input(s): LIPASE, AMYLASE in the last 8760 hours. No results for input(s): AMMONIA in the last 8760 hours. CBC: Recent Labs    10/26/19 0935  WBC 7.0  NEUTROABS 4,424  HGB 16.5  HCT 48.3  MCV 93.1  PLT 238   Lipid Panel: Recent Labs    10/26/19 0935  CHOL 155  HDL 56  LDLCALC 84  TRIG 73  CHOLHDL 2.8   Lab Results  Component Value Date    HGBA1C 5.8 (H) 10/07/2016    Procedures since last visit: No results found.  Assessment/Plan 1. Epistaxis -due to dryness and nasal spray -counseled on proper administration   2. Deviated nasal septum -contributes to increased risk of infection on right  3. Chronic maxillary sinusitis -ongoing, counseled about signs of acute infection to look out for that warrant antibiotics, but NOT currently indicated at this time--he will call back if he needs them  4. Tinnitus, unspecified laterality -chronic, but had some change in this after covid vaccine which I've not heard of   5.  Need for influenza vaccine -requests regular dose not high dose b/c he did not feel well after high dose before  Labs/tests ordered:  FLP before sees Dr. Tamala Julian at his request Next appt:  01/28/2021--keep regular visit as scheduled with labs before   [place]. Richel Millspaugh, D.O. [facility] [address], [place] [address] Cell Phone (Mon-Fri 8am-5pm):  [phone] On Call:  [phone] & follow prompts after 5pm & weekends Office Phone:  [phone] Office Fax:  [phone]

## 2020-10-22 NOTE — Patient Instructions (Addendum)
Avoid the sensi-mist spray and any nasal spray for at least a week.   When you do resume it, be sure to spray a bit more to the outside of the nose, not onto your septum.   If you develop asymmetric congestion with increased pressure, discolored mucus, fever, chills, draning of just one eye and asymmetric headache, that's when you may need to take a z-pak/zithromax.  Nosebleed, Adult A nosebleed is when blood comes out of the nose. Nosebleeds are common. Usually, they are not a sign of a serious condition. Nosebleeds can happen if a small blood vessel in your nose starts to bleed or if the lining of your nose (mucous membrane) cracks. They are commonly caused by:  Allergies.  Colds.  Picking your nose.  Blowing your nose too hard.  An injury from sticking an object into your nose or getting hit in the nose.  Dry or cold air. Less common causes of nosebleeds include:  Toxic fumes.  Something abnormal in the nose or in the air-filled spaces in the bones of the face (sinuses).  Growths in the nose, such as polyps.  Medicines or conditions that cause blood to clot slowly.  Certain illnesses or procedures that irritate or dry out the nasal passages. Follow these instructions at home: When you have a nosebleed:   Sit down and tilt your head slightly forward.  Use a clean towel or tissue to pinch your nostrils under the bony part of your nose. After 10 minutes, let go of your nose and see if bleeding starts again. Do not release pressure before that time. If there is still bleeding, repeat the pinching and holding for 10 minutes until the bleeding stops.  Do not place tissues or gauze in the nose to stop bleeding.  Avoid lying down and avoid tilting your head backward. That may make blood collect in the throat and cause gagging or coughing.  Use a nasal spray decongestant to help with a nosebleed as told by your health care provider.  Do not use petroleum jelly or mineral oil in  your nose. It can drip into your lungs. After a nosebleed:  Avoid blowing your nose or sniffing for a number of hours.  Avoid straining, lifting, or bending at the waist for several days. You may resume other normal activities as you are able.  Use saline spray or a humidifier as told by your health care provider.  Aspirinand blood thinners make bleeding more likely. If you are prescribed these medicines and you suffer from nosebleeds: ? Ask your health care provider if you should stop taking the medicines or if you should adjust the dose. ? Do not stop taking medicines that your health care provider has recommended unless told by your health care provider.  If your nosebleed was caused by dry mucous membranes, use over-the-counter saline nasal spray or gel. This will keep the mucous membranes moist and allow them to heal. If you must use a lubricant: ? Choose one that is water-soluble. ? Use only as much as you need and use it only as often as needed. ? Do not lie down until several hours after you use it. Contact a health care provider if:  You have a fever.  You get nosebleeds often or more often than usual.  You bruise very easily.  You have a nosebleed from having something stuck in your nose.  You have bleeding in your mouth.  You vomit or cough up brown material.  You have  a nosebleed after you start a new medicine. Get help right away if:  You have a nosebleed after a fall or a head injury.  Your nosebleed does not go away after 20 minutes.  You feel dizzy or weak.  You have unusual bleeding from other parts of your body.  You have unusual bruising on other parts of your body.  You become sweaty.  You vomit blood. This information is not intended to replace advice given to you by your health care provider. Make sure you discuss any questions you have with your health care provider. Document Revised: 02/23/2018 Document Reviewed: 06/10/2016 Elsevier Patient  Education  [place].

## 2020-11-07 ENCOUNTER — Encounter (INDEPENDENT_AMBULATORY_CARE_PROVIDER_SITE_OTHER): Payer: Self-pay | Admitting: Otolaryngology

## 2020-11-07 ENCOUNTER — Other Ambulatory Visit: Payer: Self-pay

## 2020-11-07 ENCOUNTER — Ambulatory Visit (INDEPENDENT_AMBULATORY_CARE_PROVIDER_SITE_OTHER): Payer: Medicare Other | Admitting: Otolaryngology

## 2020-11-07 VITALS — Temp 97.2°F

## 2020-11-07 DIAGNOSIS — J342 Deviated nasal septum: Secondary | ICD-10-CM | POA: Diagnosis not present

## 2020-11-07 DIAGNOSIS — Z87898 Personal history of other specified conditions: Secondary | ICD-10-CM

## 2020-11-07 DIAGNOSIS — J310 Chronic rhinitis: Secondary | ICD-10-CM | POA: Diagnosis not present

## 2020-11-07 NOTE — Progress Notes (Addendum)
HPI: Alexander [name] is a 84 y.o. male who returns today for evaluation of nasal symptoms and ear symptoms.  He has had recent right-sided nosebleed that was able to stop with pressure.  But he has had some crusting and scabbing on the right side of the nose.  He is also had a little bit of light-colored mucus discharge from his nose.  He is always had more trouble on the right side of his nose because of a septal deviation. He also complains of intermittent right ear pain and sometimes a "rumbling" in the nose. He has a disability for hearing loss and has hearing aids but does not wear them often..  Past Medical History:  Diagnosis Date  . Allergic rhinitis due to pollen   . Anginal pain ([place])    once in a while, none recent  . Anxiety   . Anxiety disorder   . Asthma    exercise induced  . Atrial fibrillation (HCC)    hx of  . Basal cell carcinoma of skin of lip   . Clotting disorder ([place])   . Coronary atherosclerosis of native coronary artery    with CABG 3/12  -MAZE procedure   . Diastasis recti 05/07/2016  . Difficulty urinating   . Diverticulosis of colon (without mention of hemorrhage)    mild  . Dysuria   . Edema   . Essential hypertension, benign   . Heart attack ([place]) 2012  . Helicobacter pylori (H. pylori)   . 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 History of anal fissures   . History of cholelithiasis   . Hyperlipidemia   . Hypertrophy of prostate with urinary obstruction and other lower urinary tract symptoms (LUTS)   . Impotence of organic origin   . Internal hemorrhoids without mention of complication   . Jaundice age 77  . Long term (current) use of anticoagulants   . Neutropenia ([place]) 10/07/2016  . Nonspecific elevation of levels of transaminase or lactic acid dehydrogenase (LDH)   . Other voice and resonance disorders    after surgery, took breathing tube out and damaged something  . Pneumonia   . Pulmonary embolism ([place]) 2012   s/p heart  surgery  . Pulmonary embolism ([place])    3/12  . Rheumatic fever age 89  . Skin cancer    Hx of squamous cell x2  . Sleep apnea    no CPAP  . Unspecified constipation    Past Surgical History:  Procedure Laterality Date  . CARDIAC CATHETERIZATION    . CHOLECYSTECTOMY N/A 11/18/2013   Procedure: LAPAROSCOPIC CHOLECYSTECTOMY With IOC;  Surgeon: Odis Hollingshead, MD;  Location: [facility];  Service: General;  Laterality: N/A;  . CORONARY ARTERY BYPASS GRAFT  2012   Median sternotomy, extracorporeal circulation, coronary  . HERNIA REPAIR  2010   bilateral  . HYDROCELE EXCISION     dr [name]   . MOHS SURGERY     squamus x2; BCC fo bridge of nose June 2017.  Marland Kitchen SCROTUM EXPLORATION  1990's   multiple  . SPERMATOCELECTOMY  02/1999   Right, Dr.[name]  . SUPERFICIAL SKIN CYSTECTOMY (l) HIP Right 1997   dr [name]  . VASECTOMY  04/1992   Social History   Socioeconomic History  . Marital status: Married    Spouse name: Not on file  . Number of children: 1  . Years of education: Not on file  . Highest education level: Not on file  Occupational  History  . Occupation: Presenter, broadcasting: [employer]  Tobacco Use  . Smoking status: Former Smoker    Packs/day: 1.00    Years: 22.00    Pack years: 22.00    Types: Cigarettes    Start date: 08/22/1956    Quit date: 12/08/1977    Years since quitting: 42.9  . Smokeless tobacco: Never Used  . Tobacco comment: significant second-hand smoke exposure at work  Media planner  . Vaping Use: Never used  Substance and Sexual Activity  . Alcohol use: Yes    Alcohol/week: 0.0 standard drinks    Comment: former heavy alcohol use, rare use  . Drug use: No  . Sexual activity: Yes    Partners: Female  Other Topics Concern  . Not on file  Social History Narrative   Daily caffeine       [name]   [name]   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 No pets currently. No hot tub or bird exposure.    Married wife Social research officer, government   Social Determinants of Radio broadcast assistant Strain:   . Difficulty of Paying Living Expenses: Not on file  Food Insecurity:   . Worried About Charity fundraiser in the Last Year: Not on file  . Ran Out of Food in the Last Year: Not on file  Transportation Needs:   . Lack of Transportation (Medical): Not on file  . Lack of Transportation (Non-Medical): Not on file  Physical Activity:   . Days of Exercise per Week: Not on file  . Minutes of Exercise per Session: Not on file  Stress:   . Feeling of Stress : Not on file  Social Connections:   . Frequency of Communication with Friends and Family: Not on file  . Frequency of Social Gatherings with Friends and Family: Not on file  . Attends Religious Services: Not on file  . Active Member of Clubs or Organizations: Not on file  . Attends Archivist Meetings: Not on file  . Marital Status: Not on file   Family History  Problem Relation Age of Onset  . Bone cancer Mother   . Diabetes Mother   . Colon cancer Neg Hx   . Esophageal cancer Neg Hx   . Rectal cancer Neg Hx   . Stomach cancer Neg Hx   . Lung disease Neg Hx    Allergies  Allergen Reactions  . Heparin     Blood clots   . Penicillin G Shortness Of Breath  . Levaquin [Levofloxacin In D5w] Other (See Comments)    Patient felt jittery and nervous with insomnia on the medication.  . Aspirin     Breathing complications, can tolerate baby asa  . Codeine     Some is ok  . Sucralfate     Could not sleep  . Zetia [Ezetimibe]     jittery  . Zocor  [Simvastatin]     Liver enzymes increased  . Plavix [Clopidogrel Bisulfate] Palpitations   Prior to Admission medications   Medication Sig Start Date End Date Taking? Authorizing Provider  acetaminophen (TYLENOL) 325 MG tablet Take 325  mg by mouth at bedtime.    [provider]  albuterol (PROVENTIL HFA;VENTOLIN HFA) 108 (90 BASE) MCG/ACT inhaler Inhale 2 puffs into the lungs every 6 (six) hours as needed for wheezing. 04/13/13   Estill Dooms, MD  ALPRAZolam Duanne Moron) 0.5 MG tablet TAKE 1 TO 2 TABLETS BY MOUTH EVERY DAY AS NEEDED FOR ANXIETY 04/12/20   [name], Tiffany L, DO  aspirin 81 MG tablet Take 81 mg by mouth 3 (three) times a week. Mondays, wednesdays and fridays    [provider]  diazepam (VALIUM) 5 MG tablet TAKE 1 TO 2 TABLETS BY MOUTH AT BEDTIME AS NEEDED FOR REST 04/12/20   [name], Tiffany L, DO  rosuvastatin (CRESTOR) 10 MG tablet Take 1 tablet (10 mg total) by mouth daily. 01/03/15   Belva Crome, MD     Positive ROS: Otherwise negative  All other systems have been reviewed and were otherwise negative with the exception of those mentioned in the HPI and as above.  Physical Exam: Constitutional: Alert, well-appearing, no acute distress Ears: External ears without lesions or tenderness. Ear canals are clear bilaterally.  TMs are clear bilaterally.  On hearing screening with a tuning forks he heard slightly better in the right ear compared to the left but minimal difference with AC > BC bilaterally. Nasal: External nose without lesions. Septum mildly severely deviated to the right with little bit of scabbing crusting on the right side of septum.  After decongesting the nose both middle meatus regions were clear with no obvious mucopurulent discharge noted.  I can see where he has some bleeding from the right side of septum but there is no active bleeding presently and did not perform cauterization.. Oral: Lips and gums without lesions. Tongue and palate mucosa without  lesions. Posterior oropharynx clear. Neck: No palpable adenopathy or masses Respiratory: Breathing comfortably  Skin: No facial/neck lesions or rash noted.  Procedures  Assessment: Septal deviation to the right. History of recent right-sided epistaxis Occasional soreness on the right side.  Plan: Suggested use of nasal gel for the dryness that he complains about in the nose sometimes.  Apparently the bleeding started after he started using he and dryness in the nose occurred.  Also prescribed mupirocin 2% ointment to use in the nose twice daily for 5 days as needed soreness or scabbing in the nostril.  He will follow-up if he has any recurrent or persistent bleeding.  Briefly discussed the option of septoplasty but would not recommend that at this point. Also gave him a prescription for Z-Pak to use if he develops any chronic colored mucus discharge from the nose although on exam today mucus was clear.  He has had good success with a Z-Pak in the past.   Radene Journey, MD

## 2020-11-20 ENCOUNTER — Other Ambulatory Visit: Payer: Self-pay | Admitting: *Deleted

## 2020-11-20 DIAGNOSIS — F411 Generalized anxiety disorder: Secondary | ICD-10-CM

## 2020-11-20 DIAGNOSIS — G4700 Insomnia, unspecified: Secondary | ICD-10-CM

## 2020-11-20 MED ORDER — DIAZEPAM 5 MG PO TABS: ORAL_TABLET | ORAL | 1 refills | Status: DC

## 2020-11-20 MED ORDER — ALPRAZOLAM 0.5 MG PO TABS: ORAL_TABLET | ORAL | 1 refills | Status: DC

## 2020-11-20 NOTE — Telephone Encounter (Signed)
Received refill Request Epic LR: 04/12/2020 No Contract on File Pended Rx and sent to Dr. Mariea Clonts for approval.

## 2020-12-04 ENCOUNTER — Telehealth: Payer: Self-pay | Admitting: Nurse Practitioner

## 2020-12-04 NOTE — Telephone Encounter (Signed)
I called the patient to schedule an AWV his last one was on 01/30/20

## 2020-12-08 HISTORY — PX: ROOT CANAL: SHX2363

## 2021-01-07 ENCOUNTER — Other Ambulatory Visit: Payer: Medicare Other

## 2021-01-07 ENCOUNTER — Other Ambulatory Visit: Payer: Self-pay

## 2021-01-07 DIAGNOSIS — E7849 Other hyperlipidemia: Secondary | ICD-10-CM | POA: Diagnosis not present

## 2021-01-07 DIAGNOSIS — I10 Essential (primary) hypertension: Secondary | ICD-10-CM

## 2021-01-08 LAB — LIPID PANEL
Cholesterol: 163 mg/dL (ref <200)
HDL: 55 mg/dL (ref >=40)
LDL Cholesterol (Calc): 91 mg/dL (calc)
Non-HDL Cholesterol (Calc): 108 mg/dL (calc) (ref <130)
Total CHOL/HDL Ratio: 3 (calc) (ref <5.0)
Triglycerides: 80 mg/dL (ref <150)

## 2021-01-08 LAB — COMPLETE METABOLIC PANEL WITH GFR
AG Ratio: 1.6 (calc) (ref 1.0–2.5)
ALT: 25 U/L (ref 9–46)
AST: 25 U/L (ref 10–35)
Albumin: 4.1 g/dL (ref 3.6–5.1)
Alkaline phosphatase (APISO): 40 U/L (ref 35–144)
BUN: 11 mg/dL (ref 7–25)
CO2: 33 mmol/L — ABNORMAL HIGH (ref 20–32)
Calcium: 9.3 mg/dL (ref 8.6–10.3)
Chloride: 104 mmol/L (ref 98–110)
Creat: 1.04 mg/dL (ref 0.70–1.11)
GFR, Est African American: 77 mL/min/{1.73_m2} (ref >=60)
GFR, Est Non African American: 67 mL/min/{1.73_m2} (ref >=60)
Globulin: 2.5 g/dL (calc) (ref 1.9–3.7)
Glucose, Bld: 101 mg/dL — ABNORMAL HIGH (ref 65–99)
Potassium: 4.9 mmol/L (ref 3.5–5.3)
Sodium: 141 mmol/L (ref 135–146)
Total Bilirubin: 1.3 mg/dL — ABNORMAL HIGH (ref 0.2–1.2)
Total Protein: 6.6 g/dL (ref 6.1–8.1)

## 2021-01-08 LAB — CBC WITH DIFFERENTIAL/PLATELET
Absolute Monocytes: 525 cells/uL (ref 200–950)
Basophils Absolute: 32 cells/uL (ref 0–200)
Basophils Relative: 0.5 %
Eosinophils Absolute: 102 cells/uL (ref 15–500)
Eosinophils Relative: 1.6 %
HCT: 46.7 % (ref 38.5–50.0)
Hemoglobin: 16.3 g/dL (ref 13.2–17.1)
Lymphs Abs: 2259 cells/uL (ref 850–3900)
MCH: 32.8 pg (ref 27.0–33.0)
MCHC: 34.9 g/dL (ref 32.0–36.0)
MCV: 94 fL (ref 80.0–100.0)
MPV: 9.2 fL (ref 7.5–12.5)
Monocytes Relative: 8.2 %
Neutro Abs: 3482 cells/uL (ref 1500–7800)
Neutrophils Relative %: 54.4 %
Platelets: 230 10*3/uL (ref 140–400)
RBC: 4.97 10*6/uL (ref 4.20–5.80)
RDW: 12.2 % (ref 11.0–15.0)
Total Lymphocyte: 35.3 %
WBC: 6.4 10*3/uL (ref 3.8–10.8)

## 2021-01-08 NOTE — Progress Notes (Signed)
Blood counts, kidneys, liver and electrolytes all look good. Bad cholesterol is above goal for him of less than 70.  It has trended up.  We can discuss interventions at his appt.

## 2021-01-09 NOTE — Progress Notes (Signed)
Cardiology Office Note:    Date:  01/10/2021   ID:  Alexander [name], DOB [birth year], MRN [id number]  PCP:  Alexander Curry, DO  Cardiologist:  Alexander Grooms, MD   Referring MD: Alexander Curry, DO   Chief Complaint  Patient presents with  . Coronary Artery Disease  . Hypertension    History of Present Illness:    Alexander [name] is a 85 y.o. male with a hx of CAD/CABG and cardiovascular risk factors. He denies angina. He has had prior coronary bypass graftingIn 2012 with LIMA to LAD, SVG to distal right, and SVG to the first diagonal. He has relative statin intolerance although he is able to take very low dose Crestor. Other medical problems include hypertension and asthma.  Doing well.  No angina.  Exercising, predominantly aerobic.  No lifting because of chronic back discomfort.  In the past 12 to 14 months he has had 2 episodes of angina.  Each lasted less than 10 minutes and did not require nitroglycerin.  It is described as right precordial aching along with left jaw discomfort.  It seems to be precipitated by stress.  He does not have any limitations with cardio.  He walks up to 3-1/2 miles each time he walks.  He feels he gets greater than 150 minutes of moderate activity per week.  Past Medical History:  Diagnosis Date  . Allergic rhinitis due to pollen   . Anginal pain ([place])    once in a while, none recent  . Anxiety   . Anxiety disorder   . Asthma    exercise induced  . Atrial fibrillation (HCC)    hx of  . Basal cell carcinoma of skin of lip   . Clotting disorder ([place])   . Coronary atherosclerosis of native coronary artery    with CABG 3/12  -MAZE procedure   . Diastasis recti 05/07/2016  . Difficulty urinating   . Diverticulosis of colon (without mention of hemorrhage)    mild  . Dysuria   . Edema   . Essential hypertension, benign   . Heart attack ([place]) 2012  . Helicobacter pylori (H. pylori)   . 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 History  of anal fissures   . History of cholelithiasis   . Hyperlipidemia   . Hypertrophy of prostate with urinary obstruction and other lower urinary tract symptoms (LUTS)   . Impotence of organic origin   . Internal hemorrhoids without mention of complication   . Jaundice age 89  . Long term (current) use of anticoagulants   . Neutropenia ([place]) 10/07/2016  . Nonspecific elevation of levels of transaminase or lactic acid dehydrogenase (LDH)   . Other voice and resonance disorders    after surgery, took breathing tube out and damaged something  . Pneumonia   . Pulmonary embolism ([place]) 2012   s/p heart surgery  . Pulmonary embolism ([place])    3/12  . Rheumatic fever age 50  . Skin cancer    Hx of squamous cell x2  . Sleep apnea    no CPAP  . Unspecified constipation     Past Surgical History:  Procedure Laterality Date  . CARDIAC CATHETERIZATION    . CHOLECYSTECTOMY N/A 11/18/2013   Procedure: LAPAROSCOPIC CHOLECYSTECTOMY With IOC;  Surgeon: Alexander Hollingshead, MD;  Location: [facility];  Service: General;  Laterality: N/A;  . CORONARY ARTERY BYPASS GRAFT  2012   Median sternotomy, extracorporeal circulation, coronary  .  HERNIA REPAIR  2010   bilateral  . HYDROCELE EXCISION     dr Alexander [name]   . MOHS SURGERY     squamus x2; BCC fo bridge of nose June 2017.  Marland Kitchen SCROTUM EXPLORATION  1990's   multiple  . SPERMATOCELECTOMY  02/1999   Right, Alexander [name]  . SUPERFICIAL SKIN CYSTECTOMY (l) HIP Right 1997   dr Alexander [name]  . VASECTOMY  04/1992    Current Medications: Current Meds  Medication Sig  . acetaminophen (TYLENOL) 325 MG tablet Take 325 mg by mouth at bedtime.  Marland Kitchen albuterol (PROVENTIL HFA;VENTOLIN HFA) 108 (90 BASE) MCG/ACT inhaler Inhale 2 puffs into the lungs every 6 (six) hours as needed for wheezing.  Marland Kitchen ALPRAZolam (XANAX) 0.5 MG tablet TAKE 1 TO 2 TABLETS BY MOUTH EVERY DAY AS NEEDED FOR ANXIETY  . aspirin 81 MG tablet Take 81 mg by mouth 3 (three) times a week. Mondays, wednesdays and  fridays  . diazepam (VALIUM) 5 MG tablet TAKE 1 TO 2 TABLETS BY MOUTH AT BEDTIME AS NEEDED FOR REST  . rosuvastatin (CRESTOR) 10 MG tablet Take 1 tablet (10 mg total) by mouth daily.     Allergies:   Heparin, Penicillin g, Levaquin [levofloxacin in d5w], Aspirin, Codeine, Sucralfate, Zetia [ezetimibe], Zocor [simvastatin], and Plavix [clopidogrel bisulfate]   Social History   Socioeconomic History  . Marital status: Married    Spouse name: Not on file  . Number of children: 1  . Years of education: Not on file  . Highest education level: Not on file  Occupational History  . Occupation: Presenter, broadcasting: [name]  Tobacco Use  . Smoking status: Former Smoker    Packs/day: 1.00    Years: 22.00    Pack years: 22.00    Types: Cigarettes    Start date: 08/22/1956    Quit date: 12/08/1977    Years since quitting: 43.1  . Smokeless tobacco: Never Used  . Tobacco comment: significant second-hand smoke exposure at work  Media planner  . Vaping Use: Never used  Substance and Sexual Activity  . Alcohol use: Yes    Alcohol/week: 0.0 standard drinks    Comment: former heavy alcohol use, rare use  . Drug use: No  . Sexual activity: Yes    Partners: Female  Other Topics Concern  . Not on file  Social History Narrative   Daily caffeine       Alexander [name]/Alexander [name]- Orthopedics   Alexander [name]- Pulmonologist   Dr. 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 No pets currently. No hot tub or bird exposure.    Married wife Social research officer, government   Social Determinants of Radio broadcast assistant Strain: Not on file  Food  Insecurity: Not on file  Transportation Needs: Not on file  Physical Activity: Not on file  Stress: Not on file  Social Connections: Not on file     Family History: The patient's family history includes Bone cancer in his mother; Diabetes in his mother. There is no history of Colon cancer, Esophageal cancer, Rectal cancer, Stomach cancer, or Lung disease.  ROS:   Please see the history of present illness.    He independently cut  Crestor to 1/3 tablet/day because of fatigue.  The fatigue did not improve.  His most recent LDL was 91.  Because of that he increase back to 10 mg/day about 1 month ago.  No change in fatigue.  Low back discomfort, pain in his right jaw related to a root canal done recently, occasional episodes of panting breathing that he is uncertain of etiology.  He does have obstructive sleep apnea.  Admits that he does not always wear CPAP.  All other systems reviewed and are negative.  EKGs/Labs/Other Studies Reviewed:    The following studies were reviewed today:  LDL 91 January 07, 2021, other laboratory data is unremarkable.  EKG:  EKG sinus rhythm, normal axis, significant first-degree AV block, 246 ms, otherwise normal in appearance.  When compared to June 2019, no changes noted..  Recent Labs: 01/07/2021: ALT 25; BUN 11; Creat 1.04; Hemoglobin 16.3; Platelets 230; Potassium 4.9; Sodium 141  Recent Lipid Panel    Component Value Date/Time   CHOL 163 01/07/2021 0831   CHOL 168 05/02/2016 0826   TRIG 80 01/07/2021 0831   HDL 55 01/07/2021 0831   HDL 56 05/02/2016 0826   CHOLHDL 3.0 01/07/2021 0831   VLDL 17 10/07/2016 0950   LDLCALC 91 01/07/2021 0831    Physical Exam:    VS:  BP 132/64   Pulse 67   Ht 5' 9.5" (1.765 m)   Wt 188 lb 6.4 oz (85.5 kg)   SpO2 98%   BMI 27.42 kg/m     Wt Readings from Last 3 Encounters:  01/10/21 188 lb 6.4 oz (85.5 kg)  10/22/20 188 lb (85.3 kg)  07/30/20 183 lb 6.4 oz (83.2 kg)     GEN: Healthy-appearing. No acute  distress HEENT: Normal NECK: No JVD. LYMPHATICS: No lymphadenopathy CARDIAC: No murmur. RRR no gallop, or edema. VASCULAR:  Normal Pulses. No bruits. RESPIRATORY:  Clear to auscultation without rales, wheezing or rhonchi  ABDOMEN: Soft, non-tender, non-distended, No pulsatile mass, MUSCULOSKELETAL: No deformity  SKIN: Warm and dry NEUROLOGIC:  Alert and oriented x 3 PSYCHIATRIC:  Normal affect   ASSESSMENT:    1. Coronary artery disease involving coronary bypass graft of native heart without angina pectoris   2. Other hyperlipidemia   3. Essential hypertension   4. Obstructive sleep apnea   5. Educated about COVID-19 virus infection    PLAN:    In order of problems listed above:  1. Secondary prevention discussed.  Reviewed the importance of statin therapy (pleomorphic). 2. Continue 10 mg of rosuvastatin daily. 3. Blood pressures under excellent control on no therapy.  He monitor his sodium intake. 4. Encouraged CPAP use. 5. Vaccinated and practicing social distancing.  Overall education and awareness concerning secondary risk prevention was discussed in detail: LDL less than 70, hemoglobin A1c less than 7, blood pressure target less than 130/80 mmHg, >150 minutes of moderate aerobic activity per week, avoidance of smoking, weight control (via diet and exercise), and continued surveillance/management of/for obstructive sleep apnea.    Medication Adjustments/Labs and Tests Ordered: Current medicines are reviewed at length with the patient today.  Concerns regarding medicines are outlined above.  Orders Placed This Encounter  Procedures  . EKG 12-Lead   No orders of the defined types were placed in this encounter.   Patient Instructions  Medication Instructions:  Your physician recommends that you continue on your current medications as directed. Please refer to the Current Medication list given to you today.  *If you need a refill  on your cardiac medications before your  next appointment, please call your pharmacy*   Lab Work: None If you have labs (blood work) drawn today and your tests are completely normal, you will receive your results only by: Marland Kitchen MyChart Message (if you have MyChart) OR . A paper copy in the mail If you have any lab test that is abnormal or we need to change your treatment, we will call you to review the results.   Testing/Procedures: None   Follow-Up: At [facility], you and your health needs are our priority.  As part of our continuing mission to provide you with exceptional heart care, we have created designated Provider Care Teams.  These Care Teams include your primary Cardiologist (physician) and Advanced Practice Providers (APPs -  Physician Assistants and Nurse Practitioners) who all work together to provide you with the care you need, when you need it.  We recommend signing up for the patient portal called "MyChart".  Sign up information is provided on this After Visit Summary.  MyChart is used to connect with patients for Virtual Visits (Telemedicine).  Patients are able to view lab/test results, encounter notes, upcoming appointments, etc.  Non-urgent messages can be sent to your provider as well.   To learn more about what you can do with MyChart, go to [email or web address].    Your next appointment:   1 year(s)  The format for your next appointment:   In Person  Provider:   You may see Dr. Daneen Schick or one of the following Advanced Practice Providers on your designated Care Team:    Kathyrn Drown, NP    Other Instructions      Signed, Alexander Grooms, MD  01/10/2021 10:45 AM    [place]

## 2021-01-10 ENCOUNTER — Ambulatory Visit: Payer: Medicare Other | Admitting: Interventional Cardiology

## 2021-01-10 ENCOUNTER — Other Ambulatory Visit: Payer: Self-pay

## 2021-01-10 ENCOUNTER — Encounter: Payer: Self-pay | Admitting: Interventional Cardiology

## 2021-01-10 VITALS — BP 132/64 | HR 67 | Ht 69.5 in | Wt 188.4 lb

## 2021-01-10 DIAGNOSIS — I25810 Atherosclerosis of coronary artery bypass graft(s) without angina pectoris: Secondary | ICD-10-CM

## 2021-01-10 DIAGNOSIS — I10 Essential (primary) hypertension: Secondary | ICD-10-CM

## 2021-01-10 DIAGNOSIS — G4733 Obstructive sleep apnea (adult) (pediatric): Secondary | ICD-10-CM

## 2021-01-10 DIAGNOSIS — E7849 Other hyperlipidemia: Secondary | ICD-10-CM | POA: Diagnosis not present

## 2021-01-10 DIAGNOSIS — Z7189 Other specified counseling: Secondary | ICD-10-CM | POA: Diagnosis not present

## 2021-01-10 NOTE — Patient Instructions (Signed)
Medication Instructions:  Your physician recommends that you continue on your current medications as directed. Please refer to the Current Medication list given to you today.  *If you need a refill on your cardiac medications before your next appointment, please call your pharmacy*   Lab Work: None If you have labs (blood work) drawn today and your tests are completely normal, you will receive your results only by: . MyChart Message (if you have MyChart) OR . A paper copy in the mail If you have any lab test that is abnormal or we need to change your treatment, we will call you to review the results.   Testing/Procedures: None   Follow-Up: At [facility], you and your health needs are our priority.  As part of our continuing mission to provide you with exceptional heart care, we have created designated Provider Care Teams.  These Care Teams include your primary Cardiologist (physician) and Advanced Practice Providers (APPs -  Physician Assistants and Nurse Practitioners) who all work together to provide you with the care you need, when you need it.  We recommend signing up for the patient portal called "MyChart".  Sign up information is provided on this After Visit Summary.  MyChart is used to connect with patients for Virtual Visits (Telemedicine).  Patients are able to view lab/test results, encounter notes, upcoming appointments, etc.  Non-urgent messages can be sent to your provider as well.   To learn more about what you can do with MyChart, go to [email or web address].    Your next appointment:   1 year(s)  The format for your next appointment:   In Person  Provider:   You may see Dr. Henry Smith or one of the following Advanced Practice Providers on your designated Care Team:    Jill McDaniel, NP    Other Instructions   

## 2021-01-28 ENCOUNTER — Other Ambulatory Visit: Payer: Self-pay

## 2021-01-28 ENCOUNTER — Other Ambulatory Visit: Payer: Medicare Other

## 2021-01-28 ENCOUNTER — Encounter: Payer: Self-pay | Admitting: Internal Medicine

## 2021-01-28 DIAGNOSIS — E7849 Other hyperlipidemia: Secondary | ICD-10-CM

## 2021-01-28 DIAGNOSIS — R739 Hyperglycemia, unspecified: Secondary | ICD-10-CM

## 2021-01-29 LAB — HEMOGLOBIN A1C
Hgb A1c MFr Bld: 5.9 % of total Hgb — ABNORMAL HIGH (ref <5.7)
Mean Plasma Glucose: 123 mg/dL
eAG (mmol/L): 6.8 mmol/L

## 2021-01-29 NOTE — Progress Notes (Signed)
Sugar average is up slightly from last check but down from the previous.  Remains in the lower end of the prediabetic/hyperglycemic range.  He should continue his walking program and trying to avoid sweets and high carb meals

## 2021-01-31 ENCOUNTER — Other Ambulatory Visit: Payer: Self-pay

## 2021-01-31 ENCOUNTER — Encounter: Payer: Self-pay | Admitting: Internal Medicine

## 2021-01-31 ENCOUNTER — Ambulatory Visit (INDEPENDENT_AMBULATORY_CARE_PROVIDER_SITE_OTHER): Payer: Medicare Other | Admitting: Internal Medicine

## 2021-01-31 VITALS — BP 114/60 | HR 73 | Temp 97.1°F | Ht 70.0 in | Wt 188.0 lb

## 2021-01-31 DIAGNOSIS — F5104 Psychophysiologic insomnia: Secondary | ICD-10-CM

## 2021-01-31 DIAGNOSIS — J4520 Mild intermittent asthma, uncomplicated: Secondary | ICD-10-CM

## 2021-01-31 DIAGNOSIS — R739 Hyperglycemia, unspecified: Secondary | ICD-10-CM | POA: Diagnosis not present

## 2021-01-31 DIAGNOSIS — M48062 Spinal stenosis, lumbar region with neurogenic claudication: Secondary | ICD-10-CM | POA: Diagnosis not present

## 2021-01-31 DIAGNOSIS — I10 Essential (primary) hypertension: Secondary | ICD-10-CM | POA: Diagnosis not present

## 2021-01-31 DIAGNOSIS — I25810 Atherosclerosis of coronary artery bypass graft(s) without angina pectoris: Secondary | ICD-10-CM | POA: Diagnosis not present

## 2021-01-31 DIAGNOSIS — E7849 Other hyperlipidemia: Secondary | ICD-10-CM

## 2021-01-31 DIAGNOSIS — D692 Other nonthrombocytopenic purpura: Secondary | ICD-10-CM

## 2021-01-31 DIAGNOSIS — F411 Generalized anxiety disorder: Secondary | ICD-10-CM

## 2021-01-31 NOTE — Progress Notes (Signed)
Location:  [name] clinic Provider:  Emaya Preston L. Mariea Clonts, D.O., C.M.D.  Goals of Care:  Advanced Directives 01/31/2021  Does Patient Have a Medical Advance Directive? No  Type of Advance Directive -  Does patient want to make changes to medical advance directive? -  Copy of [place] in Chart? -  Would patient like information on creating a medical advance directive? Yes (ED - Information included in AVS)  Pre-existing out of facility DNR order (yellow form or pink MOST form) -  still don't have copies of advanced directives on file  Chief Complaint  Patient presents with  . Medical Management of Chronic Issues    6 month follow-up, discuss A1c, and update treatment agreement.     HPI: Patient is a 85 y.o. [name] seen today for medical management of chronic diseases.    He has cut back some on cakes, candy.  He is eating a lower sugar cereal.  His mother had diabetes.  He was the primary caregiver for her.  Hba1c 5.9.    He had a root canal on right lower jaw.  When he got home from endodontist--was swollen all the way to his ear.  For over three weeks, he was having twitches in nose, upper lips and eye.  Couldn't sleep on that side for that time.  A month later, it slowly diminished on a Sunday.  Has not done it in a week.  He is to get a partial now.  Says it's been a painful as his heart operations. He also had amoxicillin tid for a week.    Says he is just now catching up on sleep after that.  He did not walk as much in the cold weather, but walked 3.5 miles yesterday, doing yoga, stretches with tai chi.  All helping his lower back with loss of his disc since 1984 and his spinal stenosis.  Has some constant pain he lives with and enjoys most days.  Cannot do heavy weights.  Has a CBD cream to use if it's bad rather than taking pain medication.    His walking helps anxiety and energy and enthusiasm in the mornings.    He saw Dr. Tamala Julian and had a good cardiac check  up.  BP 114-132, but that's all great.    He had reduced his crestor to 25m.  He was taking just 2/3 of a pill instead so he's working on getting things back on track including the full crestor 152m  He plays guitar and sings.  No shortness of breath or wheezing since he started back walking.  Has not needed to use his albuterol.  Past Medical History:  Diagnosis Date  . Allergic rhinitis due to pollen   . Anginal pain ([place]   once in a while, none recent  . Anxiety   . Anxiety disorder   . Asthma    exercise induced  . Atrial fibrillation (HCC)    hx of  . Basal cell carcinoma of skin of lip   . Clotting disorder ([place]  . Coronary atherosclerosis of native coronary artery    with CABG 3/12  -MAZE procedure   . Diastasis recti 05/07/2016  . Difficulty urinating   . Diverticulosis of colon (without mention of hemorrhage)    mild  . Dysuria   . Edema   . Essential hypertension, benign   . Heart attack (H[place]2012  . Helicobacter pylori (H. pylori)   . 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 History of anal fissures   . History of cholelithiasis   . Hyperlipidemia   . Hypertrophy of prostate with urinary obstruction and other lower urinary tract symptoms (LUTS)   . Impotence of organic origin   . Internal hemorrhoids without mention of complication   . Jaundice age 42  . Long term (current) use of anticoagulants   . Neutropenia ([place]) 10/07/2016  . Nonspecific elevation of levels of transaminase or lactic acid dehydrogenase (LDH)   . Other voice and resonance disorders    after surgery, took breathing tube out and damaged something  . Pneumonia   . Pulmonary embolism ([place]) 2012   s/p heart surgery  . Pulmonary embolism ([place])    3/12  . Rheumatic fever age 81  . Skin cancer    Hx of squamous cell x2  . Sleep apnea    no CPAP  . Unspecified constipation     Past Surgical History:  Procedure Laterality Date  . CARDIAC CATHETERIZATION    .  CHOLECYSTECTOMY N/A 11/18/2013   Procedure: LAPAROSCOPIC CHOLECYSTECTOMY With IOC;  Surgeon: Odis Hollingshead, MD;  Location: [facility];  Service: General;  Laterality: N/A;  . CORONARY ARTERY BYPASS GRAFT  2012   Median sternotomy, extracorporeal circulation, coronary  . HERNIA REPAIR  2010   bilateral  . HYDROCELE EXCISION     dr [name]   . MOHS SURGERY     squamus x2; BCC fo bridge of nose June 2017.  Marland Kitchen ROOT CANAL  12/2020   Pr patient he had complications   . SCROTUM EXPLORATION  1990's   multiple  . SPERMATOCELECTOMY  02/1999   Right, Dr.[name]  . SUPERFICIAL SKIN CYSTECTOMY (l) HIP Right 1997   dr [name]  . VASECTOMY  04/1992    Allergies  Allergen Reactions  . Heparin     Blood clots   . Penicillin G Shortness Of Breath  . Levaquin [Levofloxacin In D5w] Other (See Comments)    Patient felt jittery and nervous with insomnia on the medication.  . Aspirin     Breathing complications, can tolerate baby asa  . Codeine     Some is ok  . Sucralfate     Could not sleep  . Zetia [Ezetimibe]     jittery  . Zocor [Simvastatin]     Liver enzymes increased  . Plavix [Clopidogrel Bisulfate] Palpitations    Outpatient Encounter Medications as of 01/31/2021  Medication Sig  . acetaminophen (TYLENOL) 325 MG tablet Take 325 mg by mouth at bedtime.  Marland Kitchen albuterol (PROVENTIL HFA;VENTOLIN HFA) 108 (90 BASE) MCG/ACT inhaler Inhale 2 puffs into the lungs every 6 (six) hours as needed for wheezing.  Marland Kitchen ALPRAZolam (XANAX) 0.5 MG tablet TAKE 1 TO 2 TABLETS BY MOUTH EVERY DAY AS NEEDED FOR ANXIETY  . aspirin 81 MG tablet Take 81 mg by mouth 3 (three) times a week. Mondays, wednesdays and fridays  . diazepam (VALIUM) 5 MG tablet TAKE 1 TO 2 TABLETS BY MOUTH AT BEDTIME AS NEEDED FOR REST  . rosuvastatin (CRESTOR) 10 MG tablet Take 1 tablet (10 mg total) by mouth daily.   No facility-administered encounter medications on file as of 01/31/2021.    Review of Systems:  Review of Systems   Constitutional: Negative for chills and fever.  HENT: Positive for hearing loss. Negative for congestion and sore throat.   Eyes: Negative for blurred vision.       Glasses  Respiratory: Negative for cough and shortness of  breath.   Cardiovascular: Negative for chest pain, palpitations and leg swelling.  Gastrointestinal: Negative for abdominal pain, blood in stool, constipation, diarrhea and melena.  Genitourinary: Negative for dysuria.  Musculoskeletal: Positive for back pain. Negative for falls and joint pain.  Skin: Negative for itching and rash.  Neurological: Negative for dizziness, loss of consciousness and weakness.  Endo/Heme/Allergies: Bruises/bleeds easily.  Psychiatric/Behavioral: Negative for depression and memory loss. The patient is nervous/anxious and has insomnia.        PTSD, veteran    Health Maintenance  Topic Date Due  . TETANUS/TDAP  09/30/2029  . INFLUENZA VACCINE  Completed  . COVID-19 Vaccine  Completed  . PNA vac Low Risk Adult  Completed    Physical Exam: Vitals:   01/31/21 1032  BP: 114/60  Pulse: 73  Temp: (!) 97.1 F (36.2 C)  TempSrc: Temporal  SpO2: 97%  Weight: 188 lb (85.3 kg)  Height: 5' 10" (1.778 m)   Body mass index is 26.98 kg/m. Physical Exam Vitals reviewed.  Constitutional:      General: He is not in acute distress.    Appearance: Normal appearance. He is not ill-appearing or toxic-appearing.  HENT:     Head: Normocephalic and atraumatic.  Cardiovascular:     Rate and Rhythm: Normal rate and regular rhythm.     Pulses: Normal pulses.     Heart sounds: Normal heart sounds.  Pulmonary:     Effort: Pulmonary effort is normal.     Breath sounds: Normal breath sounds. No wheezing, rhonchi or rales.  Abdominal:     General: Bowel sounds are normal. There is no distension.     Tenderness: There is no abdominal tenderness.  Musculoskeletal:        General: Normal range of motion.     Cervical back: Neck supple.     Right  lower leg: No edema.     Left lower leg: No edema.  Lymphadenopathy:     Cervical: No cervical adenopathy.  Skin:    General: Skin is warm and dry.  Neurological:     General: No focal deficit present.     Mental Status: He is alert and oriented to person, place, and time.  Psychiatric:        Mood and Affect: Mood normal.        Behavior: Behavior normal.     Labs reviewed: Basic Metabolic Panel: Recent Labs    01/07/21 0831  NA 141  K 4.9  CL 104  CO2 33*  GLUCOSE 101*  BUN 11  CREATININE 1.04  CALCIUM 9.3   Liver Function Tests: Recent Labs    01/07/21 0831  AST 25  ALT 25  BILITOT 1.3*  PROT 6.6   No results for input(s): LIPASE, AMYLASE in the last 8760 hours. No results for input(s): AMMONIA in the last 8760 hours. CBC: Recent Labs    01/07/21 0831  WBC 6.4  NEUTROABS 3,482  HGB 16.3  HCT 46.7  MCV 94.0  PLT 230   Lipid Panel: Recent Labs    01/07/21 0831  CHOL 163  HDL 55  LDLCALC 91  TRIG 80  CHOLHDL 3.0   Lab Results  Component Value Date   HGBA1C 5.9 (H) 01/28/2021    Procedures since last visit: No results found.  Assessment/Plan 1. Other hyperlipidemia - cont to work on diet and exercise with walking and stretching programs -tolerates crestor though he struggled with several other statins and lipid meds  - Lipid  panel; Future  2. Senile purpura ([place]) - with baby asa and advancing age - CBC with Differential/Platelet; Future  3. Coronary artery disease involving coronary bypass graft of native heart without angina pectoris -cont bp, lipid mgt to prevent MI  4. Anxiety state -on longstanding benzos but he does not take xanax or valium as often lately by his report for his nerves and rest -when he first became my patient, I reviewed the risks of these -he does have PTSD symptoms and has received counseling thru [place]  5. Spinal stenosis of lumbar region with neurogenic claudication -uses tylenol at hs, stretching, doing  well  6. Mild intermittent extrinsic asthma without complication -has prn albuterol if needed, but overall doing well with breathing  7. Essential hypertension -bp controlled without meds at this time, monitor - CBC with Differential/Platelet; Future - COMPLETE METABOLIC PANEL WITH GFR; Future  8. Psychophysiological insomnia -cont valium to help him relax--not used all of the time  9. Hyperglycemia -get back on track with diet and keep walking and stretching - Hemoglobin A1c; Future   Labs/tests ordered:  Lab Orders     CBC with Differential/Platelet     COMPLETE METABOLIC PANEL WITH GFR     Hemoglobin A1c     Lipid panel  Next appt:  6 mos fasting labs before   Vanessia Bokhari L. Kellyanne Ellwanger, D.O. [facility] [address], [place] [address] Cell Phone (Mon-Fri 8am-5pm):  [phone] On Call:  [phone] & follow prompts after 5pm & weekends Office Phone:  [phone] Office Fax:  [phone]

## 2021-02-12 ENCOUNTER — Encounter: Payer: Self-pay | Admitting: Nurse Practitioner

## 2021-02-12 ENCOUNTER — Other Ambulatory Visit: Payer: Self-pay

## 2021-02-12 ENCOUNTER — Ambulatory Visit (INDEPENDENT_AMBULATORY_CARE_PROVIDER_SITE_OTHER): Payer: Medicare Other | Admitting: Nurse Practitioner

## 2021-02-12 ENCOUNTER — Telehealth: Payer: Self-pay

## 2021-02-12 DIAGNOSIS — Z0000 Encounter for general adult medical examination without abnormal findings: Secondary | ICD-10-CM | POA: Diagnosis not present

## 2021-02-12 NOTE — Patient Instructions (Signed)
Mr. Alexander [name] , Thank you for taking time to come for your Medicare Wellness Visit. I appreciate your ongoing commitment to your health goals. Please review the following plan we discussed and let me know if I can assist you in the future.   Screening recommendations/referrals: Colonoscopy aged out Recommended yearly ophthalmology/optometry visit for glaucoma screening and checkup Recommended yearly dental visit for hygiene and checkup  Vaccinations: Influenza vaccine up to date Pneumococcal vaccine up to date Tdap vaccine up to date Shingles vaccine RECOMMENDED, to get at your local pharmacy     Advanced directives: on file.   Conditions/risks identified: advance age, cardiovascular disease   Next appointment: 1 year.   Preventive Care 56 Years and Older, Male Preventive care refers to lifestyle choices and visits with your health care provider that can promote health and wellness. What does preventive care include?  A yearly physical exam. This is also called an annual well check.  Dental exams once or twice a year.  Routine eye exams. Ask your health care provider how often you should have your eyes checked.  Personal lifestyle choices, including:  Daily care of your teeth and gums.  Regular physical activity.  Eating a healthy diet.  Avoiding tobacco and drug use.  Limiting alcohol use.  Practicing safe sex.  Taking low doses of aspirin every day.  Taking vitamin and mineral supplements as recommended by your health care provider. What happens during an annual well check? The services and screenings done by your health care provider during your annual well check will depend on your age, overall health, lifestyle risk factors, and family history of disease. Counseling  Your health care provider may ask you questions about your:  Alcohol use.  Tobacco use.  Drug use.  Emotional well-being.  Home and relationship well-being.  Sexual activity.  Eating  habits.  History of falls.  Memory and ability to understand (cognition).  Work and work Statistician. Screening  You may have the following tests or measurements:  Height, weight, and BMI.  Blood pressure.  Lipid and cholesterol levels. These may be checked every 5 years, or more frequently if you are over 17 years old.  Skin check.  Lung cancer screening. You may have this screening every year starting at age 39 if you have a 30-pack-year history of smoking and currently smoke or have quit within the past 15 years.  Fecal occult blood test (FOBT) of the stool. You may have this test every year starting at age 42.  Flexible sigmoidoscopy or colonoscopy. You may have a sigmoidoscopy every 5 years or a colonoscopy every 10 years starting at age 32.  Prostate cancer screening. Recommendations will vary depending on your family history and other risks.  Hepatitis C blood test.  Hepatitis B blood test.  Sexually transmitted disease (STD) testing.  Diabetes screening. This is done by checking your blood sugar (glucose) after you have not eaten for a while (fasting). You may have this done every 1-3 years.  Abdominal aortic aneurysm (AAA) screening. You may need this if you are a current or former smoker.  Osteoporosis. You may be screened starting at age 81 if you are at high risk. Talk with your health care provider about your test results, treatment options, and if necessary, the need for more tests. Vaccines  Your health care provider may recommend certain vaccines, such as:  Influenza vaccine. This is recommended every year.  Tetanus, diphtheria, and acellular pertussis (Tdap, Td) vaccine. You may need a Td  booster every 10 years.  Zoster vaccine. You may need this after age 11.  Pneumococcal 13-valent conjugate (PCV13) vaccine. One dose is recommended after age 6.  Pneumococcal polysaccharide (PPSV23) vaccine. One dose is recommended after age 28. Talk to your health  care provider about which screenings and vaccines you need and how often you need them. This information is not intended to replace advice given to you by your health care provider. Make sure you discuss any questions you have with your health care provider. Document Released: 12/21/2015 Document Revised: 08/13/2016 Document Reviewed: 09/25/2015 Elsevier Interactive Patient Education  2017 [place] Prevention in the Home Falls can cause injuries. They can happen to people of all ages. There are many things you can do to make your home safe and to help prevent falls. What can I do on the outside of my home?  Regularly fix the edges of walkways and driveways and fix any cracks.  Remove anything that might make you trip as you walk through a door, such as a raised step or threshold.  Trim any bushes or trees on the path to your home.  Use bright outdoor lighting.  Clear any walking paths of anything that might make someone trip, such as rocks or tools.  Regularly check to see if handrails are loose or broken. Make sure that both sides of any steps have handrails.  Any raised decks and porches should have guardrails on the edges.  Have any leaves, snow, or ice cleared regularly.  Use sand or salt on walking paths during winter.  Clean up any spills in your garage right away. This includes oil or grease spills. What can I do in the bathroom?  Use night lights.  Install grab bars by the toilet and in the tub and shower. Do not use towel bars as grab bars.  Use non-skid mats or decals in the tub or shower.  If you need to sit down in the shower, use a plastic, non-slip stool.  Keep the floor dry. Clean up any water that spills on the floor as soon as it happens.  Remove soap buildup in the tub or shower regularly.  Attach bath mats securely with double-sided non-slip rug tape.  Do not have throw rugs and other things on the floor that can make you trip. What can I do  in the bedroom?  Use night lights.  Make sure that you have a light by your bed that is easy to reach.  Do not use any sheets or blankets that are too big for your bed. They should not hang down onto the floor.  Have a firm chair that has side arms. You can use this for support while you get dressed.  Do not have throw rugs and other things on the floor that can make you trip. What can I do in the kitchen?  Clean up any spills right away.  Avoid walking on wet floors.  Keep items that you use a lot in easy-to-reach places.  If you need to reach something above you, use a strong step stool that has a grab bar.  Keep electrical cords out of the way.  Do not use floor polish or wax that makes floors slippery. If you must use wax, use non-skid floor wax.  Do not have throw rugs and other things on the floor that can make you trip. What can I do with my stairs?  Do not leave any items on the stairs.  Make sure that there are handrails on both sides of the stairs and use them. Fix handrails that are broken or loose. Make sure that handrails are as long as the stairways.  Check any carpeting to make sure that it is firmly attached to the stairs. Fix any carpet that is loose or worn.  Avoid having throw rugs at the top or bottom of the stairs. If you do have throw rugs, attach them to the floor with carpet tape.  Make sure that you have a light switch at the top of the stairs and the bottom of the stairs. If you do not have them, ask someone to add them for you. What else can I do to help prevent falls?  Wear shoes that:  Do not have high heels.  Have rubber bottoms.  Are comfortable and fit you well.  Are closed at the toe. Do not wear sandals.  If you use a stepladder:  Make sure that it is fully opened. Do not climb a closed stepladder.  Make sure that both sides of the stepladder are locked into place.  Ask someone to hold it for you, if possible.  Clearly mark  and make sure that you can see:  Any grab bars or handrails.  First and last steps.  Where the edge of each step is.  Use tools that help you move around (mobility aids) if they are needed. These include:  Canes.  Walkers.  Scooters.  Crutches.  Turn on the lights when you go into a dark area. Replace any light bulbs as soon as they burn out.  Set up your furniture so you have a clear path. Avoid moving your furniture around.  If any of your floors are uneven, fix them.  If there are any pets around you, be aware of where they are.  Review your medicines with your doctor. Some medicines can make you feel dizzy. This can increase your chance of falling. Ask your doctor what other things that you can do to help prevent falls. This information is not intended to replace advice given to you by your health care provider. Make sure you discuss any questions you have with your health care provider. Document Released: 09/20/2009 Document Revised: 05/01/2016 Document Reviewed: 12/29/2014 Elsevier Interactive Patient Education  2017 [employer].

## 2021-02-12 NOTE — Progress Notes (Signed)
Subjective:   Alexander [name] is a 85 y.o. male who presents for Medicare Annual/Subsequent preventive examination.  Review of Systems     Cardiac Risk Factors include: advanced age (>17men, >34 women);dyslipidemia;hypertension     Objective:    There were no vitals filed for this visit. There is no height or weight on file to calculate BMI.  Advanced Directives 02/12/2021 01/31/2021 10/22/2020 07/30/2020 01/30/2020 11/29/2019 10/20/2019  Does Patient Have a Medical Advance Directive? No;Yes No No No No No No  Type of Advance Directive Living will - - - - - -  Does patient want to make changes to medical advance directive? No - Patient declined - - - - Yes (MAU/Ambulatory/Procedural Areas - Information given) -  Copy of [place] in [place]  Would patient like information on creating a medical advance directive? - Yes (ED - Information included in AVS) Yes (ED - Information included in AVS) Yes (ED - Information included in AVS) No - Patient declined - No - Patient declined  Pre-existing out of facility DNR order (yellow form or pink MOST form) - - - - - - -    Current Medications (verified) Outpatient Encounter Medications as of 02/12/2021  Medication Sig  . acetaminophen (TYLENOL) 325 MG tablet Take 325 mg by mouth at bedtime.  Marland Kitchen albuterol (PROVENTIL HFA;VENTOLIN HFA) 108 (90 BASE) MCG/ACT inhaler Inhale 2 puffs into the lungs every 6 (six) hours as needed for wheezing.  Marland Kitchen ALPRAZolam (XANAX) 0.5 MG tablet TAKE 1 TO 2 TABLETS BY MOUTH EVERY DAY AS NEEDED FOR ANXIETY  . aspirin 81 MG tablet Take 81 mg by mouth 3 (three) times a week. Mondays, wednesdays and fridays  . diazepam (VALIUM) 5 MG tablet TAKE 1 TO 2 TABLETS BY MOUTH AT BEDTIME AS NEEDED FOR REST  . rosuvastatin (CRESTOR) 10 MG tablet Take 1 tablet (10 mg total) by mouth daily.   No facility-administered encounter medications on file as of 02/12/2021.    Allergies (verified) Heparin, Penicillin  g, Levaquin [levofloxacin in d5w], Aspirin, Codeine, Sucralfate, Zetia [ezetimibe], Zocor [simvastatin], and Plavix [clopidogrel bisulfate]   History: Past Medical History:  Diagnosis Date  . Allergic rhinitis due to pollen   . Anginal pain ([place])    once in a while, none recent  . Anxiety   . Anxiety disorder   . Asthma    exercise induced  . Atrial fibrillation (H[facility])    hx of  . Basal cell carcinoma of skin of lip   . Clotting disorder ([place])   . Coronary atherosclerosis of native coronary artery    with CABG 3/12  -MAZE procedure   . Diastasis recti 05/07/2016  . Difficulty urinating   . Diverticulosis of colon (without mention of hemorrhage)    mild  . Dysuria   . Edema   . Essential hypertension, benign   . Heart attack ([place]) 2012  . Helicobacter pylori (H. pylori)   . 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 History of anal fissures   . History of cholelithiasis   . Hyperlipidemia   . Hypertrophy of prostate with urinary obstruction and other lower urinary tract symptoms (LUTS)   . Impotence of organic origin   . Internal hemorrhoids without mention of complication   . Jaundice age 44  . Long term (current) use of anticoagulants   . Neutropenia ([place]) 10/07/2016  . Nonspecific elevation of levels of transaminase or lactic  acid dehydrogenase (LDH)   . Other voice and resonance disorders    after surgery, took breathing tube out and damaged something  . Pneumonia   . Pulmonary embolism ([place]) 2012   s/p heart surgery  . Pulmonary embolism ([place])    3/12  . Rheumatic fever age 52  . Skin cancer    Hx of squamous cell x2  . Sleep apnea    no CPAP  . Unspecified constipation    Past Surgical History:  Procedure Laterality Date  . CARDIAC CATHETERIZATION    . CHOLECYSTECTOMY N/A 11/18/2013   Procedure: LAPAROSCOPIC CHOLECYSTECTOMY With IOC;  Surgeon: Odis Hollingshead, MD;  Location: [facility];  Service: General;  Laterality: N/A;  . CORONARY ARTERY BYPASS  GRAFT  2012   Median sternotomy, extracorporeal circulation, coronary  . HERNIA REPAIR  2010   bilateral  . HYDROCELE EXCISION     dr [name]   . MOHS SURGERY     squamus x2; BCC fo bridge of nose June 2017.  Marland Kitchen ROOT CANAL  12/2020   Pr patient he had complications   . SCROTUM EXPLORATION  1990's   multiple  . SPERMATOCELECTOMY  02/1999   Right, Dr.[name]  . SUPERFICIAL SKIN CYSTECTOMY (l) HIP Right 1997   dr [name]  . VASECTOMY  04/1992   Family History  Problem Relation Age of Onset  . Bone cancer Mother   . Diabetes Mother   . Colon cancer Neg Hx   . Esophageal cancer Neg Hx   . Rectal cancer Neg Hx   . Stomach cancer Neg Hx   . Lung disease Neg Hx    Social History   Socioeconomic History  . Marital status: Married    Spouse name: Not on file  . Number of children: 1  . Years of education: Not on file  . Highest education level: Not on file  Occupational History  . Occupation: Presenter, broadcasting: [employer]  Tobacco Use  . Smoking status: Former Smoker    Packs/day: 1.00    Years: 22.00    Pack years: 22.00    Types: Cigarettes    Start date: 08/22/1956    Quit date: 12/08/1977    Years since quitting: 43.2  . Smokeless tobacco: Never Used  . Tobacco comment: significant second-hand smoke exposure at work  Media planner  . Vaping Use: Never used  Substance and Sexual Activity  . Alcohol use: Yes    Alcohol/week: 0.0 standard drinks    Comment: former heavy alcohol use, rare use  . Drug use: No  . Sexual activity: Yes    Partners: Female  Other Topics Concern  . Not on file  Social History Narrative   Daily caffeine       [name]   [name]   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 No pets currently. No hot tub or bird exposure.    Married wife Social research officer, government   Social Determinants of Radio broadcast assistant Strain: Not on file  Food Insecurity: Not on file  Transportation Needs: Not on file  Physical Activity: Not on file  Stress: Not on file  Social Connections: Not on file    Tobacco Counseling Counseling given: Not Answered Comment: significant second-hand smoke exposure at work   Clinical Intake:  Pre-visit preparation completed: Yes  Pain : 0-10 Pain Type: Chronic pain Pain Location: Back Pain Orientation: Lower Pain Descriptors / Indicators: Aching Pain Onset: Other (comment) Pain Frequency: Constant Pain Relieving Factors: walking and stretching, tylenol and valium  Pain Relieving Factors: walking and stretching, tylenol and valium  BMI - recorded: 26 Nutritional Status: BMI 25 -29 Overweight Nutritional Risks: None Diabetes: No  How often do you need to have someone help you when you read instructions, pamphlets, or other written materials from your doctor or pharmacy?: 1 - Never  Diabetic?no         Activities of Daily Living In your present state of health, do you have any difficulty performing the following activities: 02/12/2021  Hearing? Y  Vision? N  Difficulty concentrating or making decisions? N  Walking or climbing stairs? N  Dressing or bathing? N  Doing errands, shopping? N  Preparing Food and eating ? N  Using the Toilet? N  In the past six months, have you accidently leaked urine? Y  Do you have problems with loss of bowel control? N  Managing your Medications? N  Managing your Finances? N  Housekeeping or managing your Housekeeping? N  Some recent data might be hidden    Patient Care Team: [name], DO as PCP - General (Geriatric  Medicine) Belva Crome, MD as PCP - Cardiology (Cardiology) Javier Glazier, MD as Consulting Physician (Pulmonary Disease) Danella Sensing, MD as Consulting Physician (Dermatology) Jessy Oto, MD as Consulting Physician (Orthopedic Surgery) Irine Seal, MD as Attending Physician (Urology) Jackolyn Confer, MD as Consulting Physician (General Surgery) Melrose Nakayama, MD as Consulting Physician (Cardiothoracic Surgery) Rozetta Nunnery, MD as Consulting Physician (Otolaryngology) Griselda Miner, MD as Consulting Physician (Dermatology)  Indicate any recent Medical Services you may have received from other than [facility] providers in the past year (date may be approximate).     Assessment:   This is a routine wellness examination for Alexander [name].  Hearing/Vision screen  Hearing Screening   125Hz  250Hz  500Hz  1000Hz  2000Hz  3000Hz  4000Hz  6000Hz  8000Hz   Right ear:           Left ear:           Comments: Patient wears hearing aids  Vision Screening Comments: Patient has no vision problems. Had cataract surgery. Been 2 years since last eye exam due to COVID  Dietary issues and exercise activities discussed: Current Exercise Habits: Home exercise routine, Type of exercise: strength training/weights;walking;calisthenics, Time (Minutes): > 60, Frequency (Times/Week): 5, Weekly Exercise (Minutes/Week): 0  Goals    . Maintaining physical activity     Starting 10/07/16, I will maintain my normal exercise routine daily/weekly.     . Patient Stated     Would like to stay physically fit, maintain relationships, and take care of himself and others.      Depression Screen PHQ 2/9 Scores 02/12/2021 01/31/2021 07/30/2020 01/30/2020 11/29/2019 01/27/2019 11/26/2018  PHQ - 2 Score 0 0 0 0 0 0 0    Fall Risk Fall Risk  02/12/2021 01/31/2021 10/22/2020 07/30/2020 01/30/2020  Falls in the past year? 0 0 0 0 0  Number falls in past yr: 0 0 0 0 0  Comment - - - - -  Injury with Fall? 0 0 0 0 0   Follow up - - - - -  FALL RISK PREVENTION PERTAINING TO THE HOME:  Any stairs in or around the home? Yes  If so, are there any without handrails? No  Home free of loose throw rugs in walkways, pet beds, electrical cords, etc? Yes  Adequate lighting in your home to reduce risk of falls? Yes   ASSISTIVE DEVICES UTILIZED TO PREVENT FALLS:  Life alert? No  Use of a cane, walker or w/c? No  Grab bars in the bathroom? No  Shower chair or bench in shower? No  Elevated toilet seat or a handicapped toilet? No   TIMED UP AND GO:  Was the test performed? No .    Cognitive Function: MMSE - Mini Mental State Exam 11/26/2018 11/20/2017 10/07/2016 11/06/2015 10/18/2014  Not completed: - - - (No Data) -  Orientation to time 5 5 5 5 5   Orientation to Place 5 5 5 5 5   Registration 3 3 3 3 3   Attention/ Calculation 5 5 5 5 5   Recall 3 2 3 3 2   Language- name 2 objects 2 2 2 2 2   Language- repeat 1 1 1 1 1   Language- follow 3 step command 3 3 3 2 3   Language- read & follow direction 1 1 1 1 1   Write a sentence 1 1 1 1 1   Copy design 1 1 1 1 1   Total score 30 29 30 29 29      6CIT Screen 02/12/2021 11/29/2019  What Year? 0 points 0 points  What month? 0 points 0 points  What time? 0 points 0 points  Count back from 20 0 points 0 points  Months in reverse 0 points 2 points  Repeat phrase 0 points 0 points  Total Score 0 2    Immunizations Immunization History  Administered Date(s) Administered  . H1N1 10/08/2008  . Influenza Split 10/13/2013, 10/02/2014  . Influenza, High Dose Seasonal PF 09/26/2015  . Influenza,inj,Quad PF,6+ Mos 10/07/2016, 08/30/2018, 09/06/2019, 10/22/2020  . Influenza-Unspecified 10/16/2006, 08/08/2008, 10/08/2009, 11/15/2012, 10/02/2014, 09/08/2015, 10/02/2016, 09/07/2018, 10/15/2020  . PFIZER(Purple Top)SARS-COV-2 Vaccination 01/03/2020, 01/14/2020, 02/04/2020, 09/27/2020  . Pneumococcal Conjugate-13 10/16/2010, 10/02/2014  . Pneumococcal  Polysaccharide-23 03/20/2006  . Td 12/15/2007  . Tdap 04/07/2008, 10/01/2019  . Tetanus 12/09/1995    TDAP status: Up to date  Flu Vaccine status: Up to date  Pneumococcal vaccine status: Up to date  Covid-19 vaccine status: Completed vaccines  Qualifies for Shingles Vaccine? Yes   Zostavax completed No   Shingrix Completed?: No.    Education has been provided regarding the importance of this vaccine. Patient has been advised to call insurance company to determine out of pocket expense if they have not yet received this vaccine. Advised may also receive vaccine at local pharmacy or Health Dept. Verbalized acceptance and understanding.  Screening Tests Health Maintenance  Topic Date Due  . TETANUS/TDAP  09/30/2029  . INFLUENZA VACCINE  Completed  . COVID-19 Vaccine  Completed  . PNA vac Low Risk Adult  Completed  . HPV VACCINES  Aged Out    Health Maintenance  There are no preventive care reminders to display for this patient.  Colorectal cancer screening: No longer required.   Lung Cancer Screening: (Low Dose CT Chest recommended if Age 38-80 years, 30 pack-year currently smoking OR have quit w/in 15years.) does not qualify.   Additional Screening:  Hepatitis C Screening: does not qualify;  Vision Screening: Recommended annual ophthalmology exams for early detection of glaucoma and other disorders of the eye. Is the patient up  to date with their annual eye exam?  No  Who is the provider or what is the name of the office in which the patient attends annual eye exams? [facility].  If pt is not established with a provider, would they like to be referred to a provider to establish care? No .   Dental Screening: Recommended annual dental exams for proper oral hygiene  Community Resource Referral / Chronic Care Management: CRR required this visit?  No   CCM required this visit?  No      Plan:     I have personally reviewed and noted the following in the patient's  chart:   . Medical and social history . Use of alcohol, tobacco or illicit drugs  . Current medications and supplements . Functional ability and status . Nutritional status . Physical activity . Advanced directives . List of other physicians . Hospitalizations, surgeries, and ER visits in previous 12 months . Vitals . Screenings to include cognitive, depression, and falls . Referrals and appointments  In addition, I have reviewed and discussed with patient certain preventive protocols, quality metrics, and best practice recommendations. A written personalized care plan for preventive services as well as general preventive health recommendations were provided to patient.     Lauree Chandler, NP   02/12/2021     Virtual Visit via Telephone Note  I connected with@ on 02/12/21 at 11:00 AM EST by telephone and verified that I am speaking with the correct person using two identifiers.  Location: Patient: home  Provider[facility]   I discussed the limitations, risks, security and privacy concerns of performing an evaluation and management service by telephone and the availability of in person appointments. I also discussed with the patient that there may be a patient responsible charge related to this service. The patient expressed understanding and agreed to proceed.   I discussed the assessment and treatment plan with the patient. The patient was provided an opportunity to ask questions and all were answered. The patient agreed with the plan and demonstrated an understanding of the instructions.   The patient was advised to call back or seek an in-person evaluation if the symptoms worsen or if the condition fails to improve as anticipated.  I provided 18 minutes of non-face-to-face time during this encounter.  Carlos American. Harle Battiest Avs printed and mailed

## 2021-02-12 NOTE — Telephone Encounter (Signed)
Mr. britt, petroni are scheduled for a virtual visit with your provider today.    Just as we do with appointments in the office, we must obtain your consent to participate.  Your consent will be active for this visit and any virtual visit you may have with one of our providers in the next 365 days.    If you have a MyChart account, I can also send a copy of this consent to you electronically.  All virtual visits are billed to your insurance company just like a traditional visit in the office.  As this is a virtual visit, video technology does not allow for your provider to perform a traditional examination.  This may limit your provider's ability to fully assess your condition.  If your provider identifies any concerns that need to be evaluated in person or the need to arrange testing such as labs, EKG, etc, we will make arrangements to do so.    Although advances in technology are sophisticated, we cannot ensure that it will always work on either your end or our end.  If the connection with a video visit is poor, we may have to switch to a telephone visit.  With either a video or telephone visit, we are not always able to ensure that we have a secure connection.   I need to obtain your verbal consent now.   Are you willing to proceed with your visit today?   NICKSON MIDDLESWORTH has provided verbal consent on 02/12/2021 for a virtual visit (video or telephone).   Carroll Kinds, [facility] 02/12/2021  8:57 AM

## 2021-02-12 NOTE — Progress Notes (Signed)
This service is provided via telemedicine  No vital signs collected/recorded due to the encounter was a telemedicine visit.   Location of patient (ex: home, work): Home  Patient consents to a telephone visit:  Yes, see encounter dated 02/12/2021  Location of the provider (ex: office, home):  [place]  Name of any referring provider: Hollace Kinnier, DO  Names of all persons participating in the telemedicine service and their role in the encounter:  Sherrie Mustache, Nurse Practitioner, Carroll Kinds, CMA, and patient.   Time spent on call: 8 minutes with medical assistant

## 2021-02-21 DIAGNOSIS — D3617 Benign neoplasm of peripheral nerves and autonomic nervous system of trunk, unspecified: Secondary | ICD-10-CM | POA: Diagnosis not present

## 2021-02-21 DIAGNOSIS — Z85828 Personal history of other malignant neoplasm of skin: Secondary | ICD-10-CM | POA: Diagnosis not present

## 2021-02-21 DIAGNOSIS — L821 Other seborrheic keratosis: Secondary | ICD-10-CM | POA: Diagnosis not present

## 2021-03-06 ENCOUNTER — Ambulatory Visit: Payer: Medicare Other | Admitting: Gastroenterology

## 2021-03-06 ENCOUNTER — Encounter: Payer: Self-pay | Admitting: Gastroenterology

## 2021-03-06 VITALS — BP 132/56 | HR 62 | Ht 69.5 in | Wt 187.0 lb

## 2021-03-06 DIAGNOSIS — K645 Perianal venous thrombosis: Secondary | ICD-10-CM | POA: Diagnosis not present

## 2021-03-06 DIAGNOSIS — K581 Irritable bowel syndrome with constipation: Secondary | ICD-10-CM

## 2021-03-06 MED ORDER — HYDROCORTISONE (PERIANAL) 2.5 % EX CREA: 1.0000 "application " | TOPICAL_CREAM | Freq: Two times a day (BID) | CUTANEOUS | 1 refills | Status: DC

## 2021-03-06 NOTE — Patient Instructions (Addendum)
We will refer you to [facility] to see Dr Dema Severin or Dr Marcello Moores and they will contact you with an appointment   How to Take a Sitz Bath A sitz bath is a warm water bath that may be used to care for your rectum, genital area, or the area between your rectum and genitals (perineum). In a sitz bath, the water only comes up to your hips and covers your buttocks. A sitz bath may be done in a bathtub or with a portable sitz bath that fits over the toilet. Your health care provider may recommend a sitz bath to help:  Relieve pain and discomfort after delivering a baby.  Relieve pain and itching from hemorrhoids or anal fissures.  Relieve pain after certain surgeries.  Relax muscles that are sore or tight. How to take a sitz bath Take 3-4 sitz baths a day, or as many as told by your health care provider. Bathtub sitz bath To take a sitz bath in a bathtub: 1. Partially fill a bathtub with warm water. The water should be deep enough to cover your hips and buttocks when you are sitting in the tub. 2. Follow your health care provider's instructions if you are told to put medicine in the water. 3. Sit in the water. Open the tub drain a little, and leave it open during your bath. 4. Turn on the warm water again, enough to replace the water that is draining out. Keep the water running throughout your bath. This helps keep the water at the right level and temperature. 5. Soak in the water for 15-20 minutes, or as long as told by your health care provider. 6. When you are done, be careful when you stand up. You may feel dizzy. 7. After the sitz bath, pat yourself dry. Do not rub your skin to dry it.   Over-the-toilet sitz bath To take a sitz bath with an over-the-toilet basin: 1. Follow the manufacturer's instructions. 2. Fill the basin with warm water. 3. Follow your health care provider's instructions if you were told to put medicine in the water. 4. Sit on the seat. Make sure the water covers your buttocks and  perineum. 5. Soak in the water for 15-20 minutes, or as long as told by your health care provider. 6. After the sitz bath, pat yourself dry. Do not rub your skin to dry it. 7. Clean and dry the basin between uses. 8. Discard the basin if it cracks, or according to the manufacturer's instructions.   Contact a health care provider if:  Your pain or itching gets worse. Do not continue with sitz baths if your symptoms get worse.  You have new symptoms. Do not continue with sitz baths until you talk with your health care provider. Summary  A sitz bath is a warm water bath in which the water only comes up to your hips and covers your buttocks.  A sitz bath may help relieve pain and discomfort after delivering a baby. It also may help with pain and itching from hemorrhoids or anal fissures, or pain after certain surgeries. It can also help to relax muscles that are sore or tight.  Take 3-4 sitz baths a day, or as many as told by your health care provider. Soak in the water for 15-20 minutes.  Do not continue with sitz baths if your symptoms get worse. This information is not intended to replace advice given to you by your health care provider. Make sure you discuss any questions you  have with your health care provider. Document Revised: 08/09/2020 Document Reviewed: 08/09/2020 Elsevier Patient Education  2021 [employer].    Due to recent changes in healthcare laws, you may see the results of your imaging and laboratory studies on MyChart before your provider has had a chance to review them.  We understand that in some cases there may be results that are confusing or concerning to you. Not all laboratory results come back in the same time frame and the provider may be waiting for multiple results in order to interpret others.  Please give [place] 48 hours in order for your provider to thoroughly review all the results before contacting the office for clarification of your results.   If you are age 83  or older, your body mass index should be between 23-30. Your Body mass index is 27.22 kg/m. If this is out of the aforementioned range listed, please consider follow up with your Primary Care Provider.  If you are age 83 or younger, your body mass index should be between 19-25. Your Body mass index is 27.22 kg/m. If this is out of the aformentioned range listed, please consider follow up with your Primary Care Provider.   Follow up as needed    I appreciate the  opportunity to care for you  Thank You   Harl Bowie , MD

## 2021-03-06 NOTE — Progress Notes (Signed)
Alexander [name]    [id number]    05-01-1936  Primary Care Physician:[name], [name], DO  Referring Physician: [name], DO [place],  [place] [address]   Chief complaint:  Hemorrhoids  HPI:  85 year old very pleasant gentleman previously followed by Dr. [name],  with complaints of symptomatic hemorrhoid Last office visit in May 2018 Last week he noticed hemorrhoid on the outside has gotten bigger in size and is painful. He has been doing sitz bath and he has been using 1% hydrocortisone cream chronically for past many years for hemorrhoids  Denies any change in bowel habits, rectal bleeding, unintentional weight loss or decreased appetite.  He is physically very active, he used to run and currently he walks 3 to 4 miles a day and also does stretching exercise daily  He has history of irritable bowel syndrome, diverticulosis, and H. pylori gastritis was treated with different regimens 3 and cleared in 2017 with amoxicillin and Biaxin.  H. pylori antigen negative in September 2017  EGD 2015 with evidence of H. pylori gastritis Colonoscopy 2013 with diverticulosis and internal hemorrhoids  MRI abdomen June 2017: 29mm Pancreatic cyst and numerous small benign-appearing hepatic cysts   Outpatient Encounter Medications as of 03/06/2021  Medication Sig  . acetaminophen (TYLENOL) 325 MG tablet Take 325 mg by mouth at bedtime.  Marland Kitchen albuterol (PROVENTIL HFA;VENTOLIN HFA) 108 (90 BASE) MCG/ACT inhaler Inhale 2 puffs into the lungs every 6 (six) hours as needed for wheezing.  Marland Kitchen ALPRAZolam (XANAX) 0.5 MG tablet TAKE 1 TO 2 TABLETS BY MOUTH EVERY DAY AS NEEDED FOR ANXIETY  . aspirin 81 MG tablet Take 81 mg by mouth 3 (three) times a week. Mondays, wednesdays and fridays  . diazepam (VALIUM) 5 MG tablet TAKE 1 TO 2 TABLETS BY MOUTH AT BEDTIME AS NEEDED FOR REST  . rosuvastatin (CRESTOR) 10 MG tablet Take 1 tablet (10 mg total) by mouth daily.   No  facility-administered encounter medications on file as of 03/06/2021.    Allergies as of 03/06/2021 - Review Complete 02/12/2021  Allergen Reaction Noted  . Heparin  08/25/2011  . Penicillin g Shortness Of Breath 11/06/2015  . Levaquin [levofloxacin in d5w] Other (See Comments) 02/29/2016  . Aspirin  08/25/2011  . Codeine  04/13/2013  . Sucralfate  09/04/2015  . Zetia [ezetimibe]  05/31/2014  . Zocor [simvastatin]  04/13/2013  . Plavix [clopidogrel bisulfate] Palpitations 04/13/2013    Past Medical History:  Diagnosis Date  . Allergic rhinitis due to pollen   . Anginal pain ([facility])    once in a while, none recent  . Anxiety   . Anxiety disorder   . Asthma    exercise induced  . Atrial fibrillation ([facility])    hx of  . Basal cell carcinoma of skin of lip   . Clotting disorder ([facility])   . Coronary atherosclerosis of native coronary artery    with CABG 3/12  -MAZE procedure   . Diastasis recti 05/07/2016  . Difficulty urinating   . Diverticulosis of colon (without mention of hemorrhage)    mild  . Dysuria   . Edema   . Essential hypertension, benign   . Heart attack ([facility]) 2012  . Helicobacter pylori (H. pylori)   . 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 History of anal fissures   . History of cholelithiasis   . Hyperlipidemia   . Hypertrophy of prostate with  urinary obstruction and other lower urinary tract symptoms (LUTS)   . Impotence of organic origin   . Internal hemorrhoids without mention of complication   . Jaundice age 59  . Long term (current) use of anticoagulants   . Neutropenia ([facility]) 10/07/2016  . Nonspecific elevation of levels of transaminase or lactic acid dehydrogenase (LDH)   . Other voice and resonance disorders    after surgery, took breathing tube out and damaged something  . Pneumonia   . Pulmonary embolism ([facility]) 2012   s/p heart surgery  . Pulmonary embolism ([facility])    3/12  . Rheumatic fever age 82  . Skin cancer    Hx of squamous cell  x2  . Sleep apnea    no CPAP  . Unspecified constipation     Past Surgical History:  Procedure Laterality Date  . CARDIAC CATHETERIZATION    . CHOLECYSTECTOMY N/A 11/18/2013   Procedure: LAPAROSCOPIC CHOLECYSTECTOMY With IOC;  Surgeon: Odis Hollingshead, MD;  Location: [facility];  Service: General;  Laterality: N/A;  . CORONARY ARTERY BYPASS GRAFT  2012   Median sternotomy, extracorporeal circulation, coronary  . HERNIA REPAIR  2010   bilateral  . HYDROCELE EXCISION     dr [name]   . MOHS SURGERY     squamus x2; BCC fo bridge of nose June 2017.  Marland Kitchen ROOT CANAL  12/2020   Pr patient he had complications   . SCROTUM EXPLORATION  1990's   multiple  . SPERMATOCELECTOMY  02/1999   Right, Dr.[name]  . SUPERFICIAL SKIN CYSTECTOMY (l) HIP Right 1997   dr [name]  . VASECTOMY  04/1992    Family History  Problem Relation Age of Onset  . Bone cancer Mother   . Diabetes Mother   . Colon cancer Neg Hx   . Esophageal cancer Neg Hx   . Rectal cancer Neg Hx   . Stomach cancer Neg Hx   . Lung disease Neg Hx     Social History   Socioeconomic History  . Marital status: Married    Spouse name: Not on file  . Number of children: 1  . Years of education: Not on file  . Highest education level: Not on file  Occupational History  . Occupation: Presenter, broadcasting: [employer]  Tobacco Use  . Smoking status: Former Smoker    Packs/day: 1.00    Years: 22.00    Pack years: 22.00    Types: Cigarettes    Start date: 08/22/1956    Quit date: 12/08/1977    Years since quitting: 43.2  . Smokeless tobacco: Never Used  . Tobacco comment: significant second-hand smoke exposure at work  Media planner  . Vaping Use: Never used  Substance and Sexual Activity  . Alcohol use: Yes    Alcohol/week: 0.0 standard drinks    Comment: former heavy alcohol use, rare use  . Drug use: No  . Sexual activity: Yes    Partners: Female  Other Topics Concern  . Not on file  Social History Narrative    Daily caffeine       [name]   [name]   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 No pets currently. No hot tub or bird exposure.    Married wife Social research officer, government   Social Determinants of Radio broadcast assistant Strain: Not on file  Food Insecurity: Not on file  Transportation Needs: Not on file  Physical Activity: Not on file  Stress: Not on file  Social Connections: Not on file  Intimate Partner Violence: Not on file      Review of systems: All other review of systems negative except as mentioned in the HPI.   Physical Exam: Vitals:   03/06/21 1043  BP: (!) 132/56  Pulse: 62   Body mass index is 27.22 kg/m. Gen:      No acute distress Ext:    No edema Neuro: alert and oriented x 3 Psych: normal mood and affect Rectal exam: Normal anal sphincter tone, no anal fissure , thrombosed right posterior external hemorrhoid Anoscopy: Small grade 1 internal hemorrhoids, no active bleeding, normal dentate line, no visible nodules  Data Reviewed:  Reviewed labs, radiology imaging, old records and pertinent past GI work up   Assessment and Plan/Recommendations: 85 year old very pleasant gentleman with symptomatic thrombosed external hemorrhoid  Will refer to colorectal surgery to get the thrombosed external hemorrhoid lanced Use Anusol cream per rectum small pea-sized amount twice daily for 7 days Advised patient to avoid using steroid cream per rectum continuously for longer than 7 days at a time Sitz bath twice daily  IBS constipation Continue with high fiber  diet and increased water intake Miralax 1/2 to 1 capful daily as needed, titrate based on response  Return as needed    The patient was provided an opportunity to ask questions and all were answered. The patient agreed with the plan and demonstrated an understanding of the instructions.  Damaris Hippo , MD    CC: [name], DO

## 2021-03-07 ENCOUNTER — Other Ambulatory Visit: Payer: Self-pay | Admitting: Internal Medicine

## 2021-03-07 DIAGNOSIS — G4700 Insomnia, unspecified: Secondary | ICD-10-CM

## 2021-03-07 DIAGNOSIS — F411 Generalized anxiety disorder: Secondary | ICD-10-CM

## 2021-03-07 NOTE — Telephone Encounter (Signed)
Patient has request refill on medication "Xanax" and "Valium". Last refill on Xanax/Valium was 11/20/2020. Patient was given 60 tablets to be taken once or twice as needed for both medications, with 1 refill. Medication pend and sent to PCP Wardell Honour, MD . Please Advise.

## 2021-03-12 ENCOUNTER — Other Ambulatory Visit: Payer: Self-pay

## 2021-03-12 DIAGNOSIS — G4700 Insomnia, unspecified: Secondary | ICD-10-CM

## 2021-03-12 DIAGNOSIS — F411 Generalized anxiety disorder: Secondary | ICD-10-CM

## 2021-03-12 MED ORDER — ALPRAZOLAM 0.5 MG PO TABS
ORAL_TABLET | ORAL | 1 refills | Status: DC
Start: 2021-03-12 — End: 2021-08-26

## 2021-03-12 MED ORDER — DIAZEPAM 5 MG PO TABS: ORAL_TABLET | ORAL | 1 refills | Status: DC

## 2021-03-12 NOTE — Telephone Encounter (Signed)
Sending to Dr. Sabra Heck to authorize refills.

## 2021-03-12 NOTE — Telephone Encounter (Signed)
I do not understand why anyone needs two Rx's that are from the same class of meds May need appt to discuss

## 2021-03-12 NOTE — Telephone Encounter (Signed)
I am not sure what he is referring to. I did not see him in office recently.

## 2021-03-12 NOTE — Telephone Encounter (Signed)
Patient states he received a phone call last Thursday from Joelene Millin who was suppose to be consulting with Dr. Sabra Heck about his Diazepam and Alprazolam refills. He is scheduled to see Dr. Sabra Heck 08/07/21. To Joelene Millin

## 2021-06-17 ENCOUNTER — Telehealth: Payer: Self-pay | Admitting: Interventional Cardiology

## 2021-06-17 DIAGNOSIS — I4891 Unspecified atrial fibrillation: Secondary | ICD-10-CM

## 2021-06-17 NOTE — Telephone Encounter (Signed)
Reviewed with Dr. Tamala Julian and he said to have pt wear a 30 day monitor.  Called pt and reviewed recommendations.  Pt agreeable to plan.

## 2021-06-17 NOTE — Telephone Encounter (Signed)
Pt c/o Shortness Of Breath: STAT if SOB developed within the last 24 hours or pt is noticeably SOB on the phone  1. Are you currently SOB (can you hear that pt is SOB on the phone)? yes  2. How long have you been experiencing SOB? A few weeks  3. Are you SOB when sitting or when up moving around? both  4. Are you currently experiencing any other symptoms? Chest pain   Pt c/o of Chest Pain: STAT if CP now or developed within 24 hours  1. Are you having CP right now? yes  2. Are you experiencing any other symptoms (ex. SOB, nausea, vomiting, sweating)? sob  3. How long have you been experiencing CP? A few weeks  4. Is your CP continuous or coming and going  5. Have you taken Nitroglycerin?  ?

## 2021-06-17 NOTE — Telephone Encounter (Signed)
Pt with intermittent SOB and CP for a few weeks now.  States he is a little SOB now (not audible) but no CP.  States he had a MAZE procedure several years ago and went 10 years without going into AF.  Feels like he has been going in and out of AF for about a week right now.  Feels like he's in rhythm right now.  SOB only occurs with exertion.  No issues when sitting still.  Denies swelling.  States he gets lightheaded with position changes at times.  Advised I will send to Dr. Tamala Julian for review.  Pt appreciative for call.

## 2021-06-18 ENCOUNTER — Telehealth: Payer: Self-pay | Admitting: *Deleted

## 2021-06-18 NOTE — Telephone Encounter (Signed)
[employer] Cardiac Event Monitor Instructions Your physician has requested you wear your cardiac event monitor for 30 days, (1-30). [employer] may call or text to confirm a shipping address. The monitor will be sent to a land address via UPS. [employer] will not ship a monitor to a PO BOX. It typically takes 3-5 days to receive your monitor after it has been enrolled. [employer] will assist with USPS tracking if your package is delayed. The telephone number for [employer] is [phone]. Once you have received your monitor, please review the enclosed instructions. Instruction tutorials can also be viewed under help and settings on the enclosed cell phone. Your monitor has already been registered assigning a specific monitor serial # to you.  Applying the monitor Remove cell phone from case and turn it on. The cell phone works as Dealer and needs to be within [employer] of you at all times. The cell phone will need to be charged on a daily basis. We recommend you plug the cell phone into the enclosed charger at your bedside table every night.  Monitor batteries: You will receive two monitor batteries labelled #1 and #2. These are your recorders. Plug battery #2 onto the second connection on the enclosed charger. Keep one battery on the charger at all times. This will keep the monitor battery deactivated. It will also keep it fully charged for when you need to switch your monitor batteries. A small light will be blinking on the battery emblem when it is charging. The light on the battery emblem will remain on when the battery is fully charged.  Open package of a Monitor strip. Insert battery #1 into black hood on strip and gently squeeze monitor battery onto connection as indicated in instruction booklet. Set aside while preparing skin.  Choose location for your strip, vertical or horizontal, as indicated in the instruction booklet. Shave to remove all hair from location. There  cannot be any lotions, oils, powders, or colognes on skin where monitor is to be applied. Wipe skin clean with enclosed Saline wipe. Dry skin completely.  Peel paper labeled #1 off the back of the Monitor strip exposing the adhesive. Place the monitor on the chest in the vertical or horizontal position shown in the instruction booklet. One arrow on the monitor strip must be pointing upward. Carefully remove paper labeled #2, attaching remainder of strip to your skin. Try not to create any folds or wrinkles in the strip as you apply it.  Firmly press and release the circle in the center of the monitor battery. You will hear a small beep. This is turning the monitor battery on. The heart emblem on the monitor battery will light up every 5 seconds if the monitor battery in turned on and connected to the patient securely. Do not push and hold the circle down as this turns the monitor battery off. The cell phone will locate the monitor battery. A screen will appear on the cell phone checking the connection of your monitor strip. This may read poor connection initially but change to good connection within the next minute. Once your monitor accepts the connection you will hear a series of 3 beeps followed by a climbing crescendo of beeps. A screen will appear on the cell phone showing the two monitor strip placement options. Touch the picture that demonstrates where you applied the monitor strip.  Your monitor strip and battery are waterproof. You are able to shower, bathe, or swim with the monitor on. They just ask you  do not submerge deeper than 3 feet underwater. We recommend removing the monitor if you are swimming in a lake, river, or ocean.  Your monitor battery will need to be switched to a fully charged monitor battery approximately once a week. The cell phone will alert you of an action which needs to be made.  On the cell phone, tap for details to reveal connection status, monitor battery  status, and cell phone battery status. The green dots indicates your monitor is in good status. A red dot indicates there is something that needs your attention.  To record a symptom, click the circle on the monitor battery. In 30-60 seconds a list of symptoms will appear on the cell phone. Select your symptom and tap save. Your monitor will record a sustained or significant arrhythmia regardless of you clicking the button. Some patients do not feel the heart rhythm irregularities. [employer] will notify us of any serious or critical events.  Refer to instruction booklet for instructions on switching batteries, changing strips, the Do not disturb or Pause features, or any additional questions.  Call [employer] at [phone], to confirm your monitor is transmitting and record your baseline. They will answer any questions you may have regarding the monitor instructions at that time.  Returning the monitor to [place] all equipment back into blue box. Peel off strip of paper to expose adhesive and close box securely. There is a prepaid UPS shipping label on this box. Drop in a UPS drop box, or at a UPS facility like [employer]. You may also contact [employer] to arrange UPS to pick up monitor package at your home.

## 2021-06-18 NOTE — Telephone Encounter (Signed)
30 day event monitor mailed to pt.

## 2021-06-19 ENCOUNTER — Other Ambulatory Visit: Payer: Self-pay

## 2021-06-19 ENCOUNTER — Other Ambulatory Visit (HOSPITAL_BASED_OUTPATIENT_CLINIC_OR_DEPARTMENT_OTHER): Payer: Self-pay

## 2021-06-19 ENCOUNTER — Ambulatory Visit: Payer: Medicare Other | Attending: Internal Medicine

## 2021-06-19 DIAGNOSIS — Z23 Encounter for immunization: Secondary | ICD-10-CM

## 2021-06-19 DIAGNOSIS — Z20822 Contact with and (suspected) exposure to covid-19: Secondary | ICD-10-CM | POA: Diagnosis not present

## 2021-06-19 DIAGNOSIS — Z03818 Encounter for observation for suspected exposure to other biological agents ruled out: Secondary | ICD-10-CM | POA: Diagnosis not present

## 2021-06-19 MED ORDER — PFIZER-BIONT COVID-19 VAC-TRIS 30 MCG/0.3ML IM SUSP
INTRAMUSCULAR | 0 refills | Status: DC
  Filled 2021-06-19: qty 0.3, 1d supply, fill #0

## 2021-06-19 NOTE — Progress Notes (Signed)
   Covid-19 Vaccination Clinic  Name:  Alexander [name]    MRN: [id number] DOB: 01-11-1936  06/19/2021  Alexander [name] was observed post Covid-19 immunization for 15 minutes without incident. He was provided with Vaccine Information Sheet and instruction to access the V-Safe system.   Alexander [name] was instructed to call 911 with any severe reactions post vaccine: Difficulty breathing  Swelling of face and throat  A fast heartbeat  A bad rash all over body  Dizziness and weakness   Immunizations Administered     Name Date Dose VIS Date Route   PFIZER Comrnaty([place] TOP) Covid-19 Vaccine 06/19/2021  1:07 PM 0.3 mL 11/15/2020 Intramuscular   Manufacturer: [place]   Lot: [id number]   [place]: [phone]

## 2021-07-05 ENCOUNTER — Telehealth: Payer: Self-pay | Admitting: *Deleted

## 2021-07-05 MED ORDER — NYSTATIN 100000 UNIT/ML MT SUSP: 5.0000 mL | Freq: Two times a day (BID) | OROMUCOSAL | 1 refills | Status: DC

## 2021-07-05 NOTE — Telephone Encounter (Signed)
Use Nystatin 100,000 unit suspension swish 5 ml by mouth twice daily.Avoid drinking or eating anything for hale an hour after use.

## 2021-07-05 NOTE — Telephone Encounter (Signed)
Medication sent to pharmacy and patient notified and agreed.

## 2021-07-05 NOTE — Telephone Encounter (Signed)
Patient called and stated that he is having thrush on his tongue. Stated that he had a Sinus infection and he had a Zpac on hand and took it. Stated that the Antibiotic intensified the thrush.  Stated that Dr. Mariea Clonts usually gave him Nystatin Mouthwash for this and it clears it up.   Patient has an upcoming appointment in August with Dr. Sabra Heck but wonders if something could be called in because it is uncomfortable.   Please Advise. (Forwarded to Dinah due to Dr. Sabra Heck out of office)

## 2021-07-10 ENCOUNTER — Encounter: Payer: Self-pay | Admitting: Family

## 2021-07-10 ENCOUNTER — Other Ambulatory Visit: Payer: Self-pay

## 2021-07-10 ENCOUNTER — Ambulatory Visit (INDEPENDENT_AMBULATORY_CARE_PROVIDER_SITE_OTHER): Payer: Medicare Other | Admitting: Family

## 2021-07-10 VITALS — BP 124/90 | HR 66 | Temp 97.8°F | Resp 16 | Ht 69.5 in | Wt 181.8 lb

## 2021-07-10 DIAGNOSIS — K521 Toxic gastroenteritis and colitis: Secondary | ICD-10-CM

## 2021-07-10 DIAGNOSIS — R438 Other disturbances of smell and taste: Secondary | ICD-10-CM

## 2021-07-10 DIAGNOSIS — R002 Palpitations: Secondary | ICD-10-CM | POA: Diagnosis not present

## 2021-07-10 DIAGNOSIS — J4520 Mild intermittent asthma, uncomplicated: Secondary | ICD-10-CM | POA: Diagnosis not present

## 2021-07-10 MED ORDER — MONTELUKAST SODIUM 10 MG PO TABS: 10.0000 mg | ORAL_TABLET | Freq: Every day | ORAL | 3 refills | Status: DC

## 2021-07-10 MED ORDER — SACCHAROMYCES BOULARDII 250 MG PO CAPS: 250.0000 mg | ORAL_CAPSULE | Freq: Two times a day (BID) | ORAL | 0 refills | Status: AC

## 2021-07-10 NOTE — Patient Instructions (Signed)
-   Please call Pulmonologist to evaluate use of albuterol.Notify provider if referral needed.    - Follow up ENT for sour taste and sinuses

## 2021-07-10 NOTE — Progress Notes (Signed)
Provider: Brodie Correll FNP-C  Wardell Honour, MD  Patient Care Team: Wardell Honour, MD as PCP - General (Family Medicine) Belva Crome, MD as PCP - Cardiology (Cardiology) Javier Glazier, MD as Consulting Physician (Pulmonary Disease) Danella Sensing, MD as Consulting Physician (Dermatology) Jessy Oto, MD as Consulting Physician (Orthopedic Surgery) Irine Seal, MD as Attending Physician (Urology) Jackolyn Confer, MD as Consulting Physician (General Surgery) Melrose Nakayama, MD as Consulting Physician (Cardiothoracic Surgery) Rozetta Nunnery, MD as Consulting Physician (Otolaryngology) Griselda Miner, MD as Consulting Physician (Dermatology)  Extended Emergency Contact Information Primary Emergency Contact: Selbe,[name] Address: [place], [place] [address] [place] Phone: [phone] Mobile Phone: [phone] Relation: Spouse  Code Status: Full Code  Goals of care: Advanced Directive information Advanced Directives 07/10/2021  Does Patient Have a Medical Advance Directive? No  Type of Advance Directive -  Does patient want to make changes to medical advance directive? -  Copy of [place] in Chart? -  Would patient like information on creating a medical advance directive? No - Patient declined  Pre-existing out of facility DNR order (yellow form or pink MOST form) -     Chief Complaint  Patient presents with   Acute Visit    Patient complains of papillations.     HPI:  Pt is a 85 y.o. male seen today for an acute visit for evaluation of palpitation.He thinks happened after using albuterol and his Fluticasone.He was going for a walk so he decided to use albuterol for his exercise induced asthma.Has not required albuterol in a very log time but since the temperature was too hot he used it. He denies any headache,dizziness,vision changes,fatigue,chest  tightness,palpitation,chest pain or shortness of breath.       tasted sweets,bitter and sour on his tongue 3 weeks after the COVID-19 vaccine.Used Nystatin which caused difficulty breathing. Also took some Z-pak antibiotics which he had to treat possible sinus infection but it gave him some diarrhea. Diarrhea has improved had x 1 episode today.  He used albuterol again  last night which caused palpitation.He checked the HR at it was in the 60's -126 b/min.Also used Flucticasone for PND.  He denies any fever,chills,cough,runny nose,body aches or myalgia.  Has used oral gel which turns his tongue black in color.did not help with his sour,bitter and sweet taste in the mouth.he denies any acid reflux.  He has booked an appointment with ENT for evaluation.    Past Medical History:  Diagnosis Date   Allergic rhinitis due to pollen    Anginal pain ([place])    once in a while, none recent   Anxiety    Anxiety disorder    Asthma    exercise induced   Atrial fibrillation (HCC)    hx of   Basal cell carcinoma of skin of lip    Clotting disorder (HCC)    Coronary atherosclerosis of native coronary artery    with CABG 3/12  -MAZE procedure    Diastasis recti 05/07/2016   Difficulty urinating    Diverticulosis of colon (without mention of hemorrhage)    mild   Dysuria    Edema    Essential hypertension, benign    Heart attack ([place]) 0000000   Helicobacter pylori (H. 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   History of anal fissures    History of  cholelithiasis    Hyperlipidemia    Hypertrophy of prostate with urinary obstruction and other lower urinary tract symptoms (LUTS)    Impotence of organic origin    Internal hemorrhoids without mention of complication    Jaundice age 51   Long term (current) use of anticoagulants    Neutropenia ([place]) 10/07/2016   Nonspecific elevation of levels of transaminase or lactic acid dehydrogenase (LDH)    Other voice and resonance  disorders    after surgery, took breathing tube out and damaged something   Pneumonia    Pulmonary embolism ([place]) 2012   s/p heart surgery   Pulmonary embolism ([place])    3/12   Rheumatic fever age 40   Skin cancer    Hx of squamous cell x2   Sleep apnea    no CPAP   Unspecified constipation    Past Surgical History:  Procedure Laterality Date   CARDIAC CATHETERIZATION     CHOLECYSTECTOMY N/A 11/18/2013   Procedure: LAPAROSCOPIC CHOLECYSTECTOMY With [place];  Surgeon: Odis Hollingshead, MD;  Location: [facility];  Service: General;  Laterality: N/A;   CORONARY ARTERY BYPASS GRAFT  2012   Median sternotomy, extracorporeal circulation, coronary   HERNIA REPAIR  2010   bilateral   HYDROCELE EXCISION     dr [name]    [facility] SURGERY     squamus x2; [facility] fo bridge of nose June 2017.   ROOT CANAL  12/2020   Pr patient he had complications    SCROTUM EXPLORATION  1990's   multiple   SPERMATOCELECTOMY  02/1999   Right, Dr.[name]   SUPERFICIAL SKIN CYSTECTOMY (l) HIP Right 1997   dr [name]   VASECTOMY  04/1992    Allergies  Allergen Reactions   Heparin     Blood clots    Penicillin G Shortness Of Breath   Levaquin [Levofloxacin In D5w] Other (See Comments)    Patient felt jittery and nervous with insomnia on the medication.   Aspirin     Breathing complications, can tolerate baby asa   Codeine     Some is ok   Sucralfate     Could not sleep   Zetia [Ezetimibe]     jittery   Zocor [Simvastatin]     Liver enzymes increased   Plavix [Clopidogrel Bisulfate] Palpitations    Outpatient Encounter Medications as of 07/10/2021  Medication Sig   acetaminophen (TYLENOL) 325 MG tablet Take 325 mg by mouth at bedtime.   albuterol (PROVENTIL HFA;VENTOLIN HFA) 108 (90 BASE) MCG/ACT inhaler Inhale 2 puffs into the lungs every 6 (six) hours as needed for wheezing.   ALPRAZolam (XANAX) 0.5 MG tablet TAKE 1 TO 2 TABLETS BY MOUTH EVERY DAY AS NEEDED FOR ANXIETY   aspirin 81 MG tablet Take 81 mg by  mouth 3 (three) times a week. Mondays, wednesdays and fridays   COVID-19 mRNA Vac-TriS, Pfizer, (PFIZER-BIONT COVID-19 VAC-TRIS) SUSP injection Inject into the muscle.   diazepam (VALIUM) 5 MG tablet TAKE 1 TO 2 TABLETS BY MOUTH AT BEDTIME AS NEEDED FOR REST   Fluticasone Furoate (FLONASE SENSIMIST NA) Place 1 spray into both nostrils daily.   hydrocortisone (ANUSOL-HC) 2.5 % rectal cream Place 1 application rectally 2 (two) times daily. Use pea sized amount rectally twice daily for 7 days   nystatin (MYCOSTATIN) 100000 UNIT/ML suspension Take 5 mLs (500,000 Units total) by mouth 2 (two) times daily.   rosuvastatin (CRESTOR) 10 MG tablet Take 1 tablet (10 mg total) by mouth daily.  No facility-administered encounter medications on file as of 07/10/2021.    Review of Systems  Constitutional:  Negative for appetite change, chills, fatigue, fever and unexpected weight change.  HENT:  Positive for postnasal drip. Negative for congestion, dental problem, ear discharge, ear pain, facial swelling, hearing loss, nosebleeds, rhinorrhea, sinus pressure, sinus pain, sneezing, sore throat, tinnitus and trouble swallowing.        Reports sour,sweet and bitter taste worsening on the tongue.  Eyes:  Negative for pain, discharge, redness, itching and visual disturbance.  Respiratory:  Negative for cough, chest tightness, shortness of breath and wheezing.   Cardiovascular:  Negative for chest pain, palpitations and leg swelling.       Palpitation with use of Albuterol and fluticasone   Gastrointestinal:  Negative for abdominal distention, abdominal pain, blood in stool, constipation, diarrhea, nausea and vomiting.  Musculoskeletal:  Negative for arthralgias, back pain, gait problem, joint swelling, myalgias, neck pain and neck stiffness.  Skin:  Negative for color change, pallor, rash and wound.  Neurological:  Negative for dizziness, syncope, speech difficulty, weakness, light-headedness, numbness and  headaches.  Hematological:  Does not bruise/bleed easily.  Psychiatric/Behavioral:  Negative for agitation, behavioral problems, confusion, hallucinations and sleep disturbance. The patient is not nervous/anxious.    Immunization History  Administered Date(s) Administered   H1N1 10/08/2008   Influenza Split 10/13/2013, 10/02/2014   Influenza, High Dose Seasonal PF 09/26/2015   Influenza,inj,Quad PF,6+ Mos 10/07/2016, 08/30/2018, 09/06/2019, 10/22/2020   Influenza-Unspecified 10/16/2006, 08/08/2008, 10/08/2009, 11/15/2012, 10/02/2014, 09/08/2015, 10/02/2016, 09/07/2018, 10/15/2020   PFIZER Comirnaty(Gray Top)Covid-19 Tri-Sucrose Vaccine 06/19/2021   PFIZER(Purple Top)SARS-COV-2 Vaccination 01/03/2020, 01/14/2020, 02/04/2020, 09/27/2020   Pneumococcal Conjugate-13 10/16/2010, 10/02/2014   Pneumococcal Polysaccharide-23 03/20/2006   Td 12/15/2007   Tdap 04/07/2008, 10/01/2019   Tetanus 12/09/1995   Pertinent  Health Maintenance Due  Topic Date Due   INFLUENZA VACCINE  07/08/2021   PNA vac Low Risk Adult  Completed   Fall Risk  07/10/2021 02/12/2021 01/31/2021 10/22/2020 07/30/2020  Falls in the past year? 0 0 0 0 0  Number falls in past yr: 0 0 0 0 0  Comment - - - - -  Injury with Fall? 0 0 0 0 0  Risk for fall due to : No Fall Risks - - - -  Follow up Falls evaluation completed - - - -   Functional Status Survey:    Vitals:   07/10/21 1333  BP: 124/90  Pulse: 66  Resp: 16  Temp: 97.8 F (36.6 C)  SpO2: 98%  Weight: 181 lb 12.8 oz (82.5 kg)  Height: 5' 9.5" (1.765 m)   Body mass index is 26.46 kg/m. Physical Exam  Labs reviewed: Recent Labs    01/07/21 0831  NA 141  K 4.9  CL 104  CO2 33*  GLUCOSE 101*  BUN 11  CREATININE 1.04  CALCIUM 9.3   Recent Labs    01/07/21 0831  AST 25  ALT 25  BILITOT 1.3*  PROT 6.6   Recent Labs    01/07/21 0831  WBC 6.4  NEUTROABS 3,482  HGB 16.3  HCT 46.7  MCV 94.0  PLT 230   Lab Results  Component Value Date    TSH 3.340 10/17/2014   Lab Results  Component Value Date   HGBA1C 5.9 (H) 01/28/2021   Lab Results  Component Value Date   CHOL 163 01/07/2021   HDL 55 01/07/2021   LDLCALC 91 01/07/2021   TRIG 80 01/07/2021   CHOLHDL 3.0 01/07/2021  Significant Diagnostic Results in last 30 days:  No results found.  Assessment/Plan 1. Palpitations Suspect possible from albuterol and fluticasone.No palpitation during visit.  - EKG 12-Lead indicates Sinus Rhythm with 1st degree AV-block no changes compared to previous EKG done 02/06/2021.   2. Diarrhea due to drug Post Z-pak antibiotic use.Has improved. Advised to take probiotic as below - encouraged fluid intake  - saccharomyces boulardii (FLORASTOR) 250 MG capsule; Take 1 capsule (250 mg total) by mouth 2 (two) times daily for 10 days.  Dispense: 20 capsule; Refill: 0 - Notify provider if symptoms worsen or fail to improve   3. Bad taste in mouth Worsen sour,sweet and bitter taste in the mouth unclear if associated with COVID-19 vaccine symptoms started after receiving vaccine.COVID-19 taste negative.  - has appointment with ENT   4. Mild intermittent extrinsic asthma without complication Add singulair to help with nasal drainage.loratadine causes palpitation when he takes. Albuterol also causing palpitation recommend following up with Pulmonologist to evaluate medication. Declined referral states will call [facility].  - montelukast (SINGULAIR) 10 MG tablet; Take 1 tablet (10 mg total) by mouth at bedtime.  Dispense: 30 tablet; Refill: 3   Family/ staff Communication: Reviewed plan of care with patient verbalized understanding  Labs/tests ordered: None   Next Appointment: As needed if symptoms worsen or fail to improve    Sandrea Hughs, NP

## 2021-07-11 ENCOUNTER — Telehealth (INDEPENDENT_AMBULATORY_CARE_PROVIDER_SITE_OTHER): Payer: Self-pay | Admitting: Otolaryngology

## 2021-07-11 NOTE — Telephone Encounter (Signed)
Alexander [name] called about white debris that was on the surface of the tongue as well as having a bitter taste.  He initially thought this was related to his sinuses and took a Z-Pak which seemed to make it worse.  He saw his PCP who prescribed nystatin.  But this did not seem to help.  The bitter taste fluctuates some but he notices the white coating on the surface of the tongue.  He has no exudate on the palate which appears normal. I discussed with him concerning oral hygiene along with use of Listerine or scope mouthwash.  As well as taking yogurt which should help.  He can also use a tongue scraper or toothbrush on the tongue.

## 2021-07-12 ENCOUNTER — Emergency Department (HOSPITAL_BASED_OUTPATIENT_CLINIC_OR_DEPARTMENT_OTHER): Payer: Medicare Other | Admitting: Radiology

## 2021-07-12 ENCOUNTER — Emergency Department (HOSPITAL_BASED_OUTPATIENT_CLINIC_OR_DEPARTMENT_OTHER)
Admission: EM | Admit: 2021-07-12 | Discharge: 2021-07-12 | Disposition: A | Discharge status: Home / Self Care | Payer: Medicare Other | Attending: Emergency Medicine | Admitting: Emergency Medicine

## 2021-07-12 ENCOUNTER — Encounter (HOSPITAL_BASED_OUTPATIENT_CLINIC_OR_DEPARTMENT_OTHER): Payer: Self-pay | Admitting: *Deleted

## 2021-07-12 ENCOUNTER — Other Ambulatory Visit: Payer: Self-pay

## 2021-07-12 DIAGNOSIS — Z955 Presence of coronary angioplasty implant and graft: Secondary | ICD-10-CM | POA: Insufficient documentation

## 2021-07-12 DIAGNOSIS — R0602 Shortness of breath: Secondary | ICD-10-CM | POA: Diagnosis not present

## 2021-07-12 DIAGNOSIS — I2510 Atherosclerotic heart disease of native coronary artery without angina pectoris: Secondary | ICD-10-CM | POA: Insufficient documentation

## 2021-07-12 DIAGNOSIS — Z8522 Personal history of malignant neoplasm of nasal cavities, middle ear, and accessory sinuses: Secondary | ICD-10-CM | POA: Diagnosis not present

## 2021-07-12 DIAGNOSIS — J45909 Unspecified asthma, uncomplicated: Secondary | ICD-10-CM | POA: Diagnosis not present

## 2021-07-12 DIAGNOSIS — Z87891 Personal history of nicotine dependence: Secondary | ICD-10-CM | POA: Diagnosis not present

## 2021-07-12 DIAGNOSIS — Z85828 Personal history of other malignant neoplasm of skin: Secondary | ICD-10-CM | POA: Insufficient documentation

## 2021-07-12 DIAGNOSIS — I499 Cardiac arrhythmia, unspecified: Secondary | ICD-10-CM | POA: Diagnosis present

## 2021-07-12 DIAGNOSIS — I480 Paroxysmal atrial fibrillation: Secondary | ICD-10-CM | POA: Diagnosis not present

## 2021-07-12 DIAGNOSIS — Z7982 Long term (current) use of aspirin: Secondary | ICD-10-CM | POA: Diagnosis not present

## 2021-07-12 DIAGNOSIS — I10 Essential (primary) hypertension: Secondary | ICD-10-CM | POA: Diagnosis not present

## 2021-07-12 LAB — CBC
HCT: 47.5 % (ref 39.0–52.0)
Hemoglobin: 16.3 g/dL (ref 13.0–17.0)
MCH: 32 pg (ref 26.0–34.0)
MCHC: 34.3 g/dL (ref 30.0–36.0)
MCV: 93.3 fL (ref 80.0–100.0)
Platelets: 211 10*3/uL (ref 150–400)
RBC: 5.09 MIL/uL (ref 4.22–5.81)
RDW: 12.2 % (ref 11.5–15.5)
WBC: 8.1 10*3/uL (ref 4.0–10.5)
nRBC: 0 % (ref 0.0–0.2)

## 2021-07-12 LAB — BASIC METABOLIC PANEL
Anion gap: 7 (ref 5–15)
BUN: 17 mg/dL (ref 8–23)
CO2: 31 mmol/L (ref 22–32)
Calcium: 9.2 mg/dL (ref 8.9–10.3)
Chloride: 100 mmol/L (ref 98–111)
Creatinine, Ser: 1.12 mg/dL (ref 0.61–1.24)
GFR, Estimated: >60 mL/min (ref >60)
Glucose, Bld: 147 mg/dL — ABNORMAL HIGH (ref 70–99)
Potassium: 4.8 mmol/L (ref 3.5–5.1)
Sodium: 138 mmol/L (ref 135–145)

## 2021-07-12 LAB — TROPONIN I (HIGH SENSITIVITY): Troponin I (High Sensitivity): 11 ng/L (ref <18)

## 2021-07-12 MED ORDER — RIVAROXABAN 20 MG PO TABS
20.0000 mg | ORAL_TABLET | Freq: Once | ORAL | Status: AC
  Administered 2021-07-12: 20 mg via ORAL
  Filled 2021-07-12: qty 1

## 2021-07-12 MED ORDER — RIVAROXABAN (XARELTO) EDUCATION KIT FOR AFIB PATIENTS
PACK | Freq: Once | Status: AC
  Filled 2021-07-12: qty 1

## 2021-07-12 MED ORDER — RIVAROXABAN 20 MG PO TABS: 20.0000 mg | ORAL_TABLET | Freq: Every day | ORAL | 0 refills | Status: DC

## 2021-07-12 NOTE — ED Notes (Signed)
This RN presented the AVS utilizing Teachback Method. Patient verbalizes understanding of Discharge Instructions. Opportunity for Questioning and Answers were provided. Patient Discharged from ED ambulatory to Home.   

## 2021-07-12 NOTE — ED Notes (Signed)
Per MD, Repeat Troponin not required for Discharge.

## 2021-07-12 NOTE — Discharge Instructions (Addendum)
Stop taking the aspirin.  If you develop severe palpitations, passing out, shortness of breath, chest pain or other concerns you should return to the emergency room. ------------------------------------------------------------------------------------------- Information on my medicine - XARELTO (Rivaroxaban)  Why was Xarelto prescribed for you? Xarelto was prescribed for you to reduce the risk of a blood clot forming that can cause a stroke if you have a medical condition called atrial fibrillation (a type of irregular heartbeat).  What do you need to know about xarelto ? Take your Xarelto ONCE DAILY at the same time every day with your evening meal. If you have difficulty swallowing the tablet whole, you may crush it and mix in applesauce just prior to taking your dose.  Take Xarelto exactly as prescribed by your doctor and DO NOT stop taking Xarelto without talking to the doctor who prescribed the medication.  Stopping without other stroke prevention medication to take the place of Xarelto may increase your risk of developing a clot that causes a stroke.  Refill your prescription before you run out.  After discharge, you should have regular check-up appointments with your healthcare provider that is prescribing your Xarelto.  In the future your dose may need to be changed if your kidney function or weight changes by a significant amount.  What do you do if you miss a dose? If you are taking Xarelto ONCE DAILY and you miss a dose, take it as soon as you remember on the same day then continue your regularly scheduled once daily regimen the next day. Do not take two doses of Xarelto at the same time or on the same day.   Important Safety Information A possible side effect of Xarelto is bleeding. You should call your healthcare provider right away if you experience any of the following: Bleeding from an injury or your nose that does not stop. Unusual colored urine (red or dark brown) or  unusual colored stools (red or black). Unusual bruising for unknown reasons. A serious fall or if you hit your head (even if there is no bleeding).  Some medicines may interact with Xarelto and might increase your risk of bleeding while on Xarelto. To help avoid this, consult your healthcare provider or pharmacist prior to using any new prescription or non-prescription medications, including herbals, vitamins, non-steroidal anti-inflammatory drugs (NSAIDs) and supplements.  This website has more information on Xarelto: [email or web address]

## 2021-07-12 NOTE — ED Provider Notes (Signed)
[facility] EMERGENCY DEPT Provider Note   CSN: [id number] Arrival date & time: 07/12/21  1315     History Chief Complaint  Patient presents with   Irregular Heart Beat   Shortness of Breath    Alexander [name] is a 85 y.o. male.  Patient is an 85 year old male with a history of hypertension, CAD status post CABG, recurrent atrial fibrillation no longer on anticoagulation, asthma who is presenting today due to irregular heartbeat and knowing that he is in atrial fibrillation.  Patient reports for the last 3 weeks he has been going in and out of atrial fibrillation.  He knows he is in A. fib when he feels palpitations and he will notice he is slightly weaker and a little more short of breath.  He is not having any chest pain.  He talked at the [place] today because of his symptoms and they reported if his heart rate dropped below 50 he should be evaluated.  He noted a heart rate in the 40s at one point as he has been monitoring all day and came in for evaluation.  He denies any other acute symptoms at this time.  He has not had any recent medication changes.  He does take 3 baby aspirin per day but in the past has not tolerated beta-blockers or calcium channel blockers.  He did does see Dr. Daneen Schick.  He denies any unilateral leg pain or swelling.  No recent surgeries.  He is very active and is still been able to walk his normal 3 to 3-1/2 miles.  He has been eating and drinking normally.  He does not use any stimulants or drink excessive alcohol.  The history is provided by the patient and medical records.  Shortness of Breath     Past Medical History:  Diagnosis Date   Allergic rhinitis due to pollen    Anginal pain ([place])    once in a while, none recent   Anxiety    Anxiety disorder    Asthma    exercise induced   Atrial fibrillation (HCC)    hx of   Basal cell carcinoma of skin of lip    Clotting disorder (HCC)    Coronary atherosclerosis of native coronary artery     with CABG 3/12  -MAZE procedure    Diastasis recti 05/07/2016   Difficulty urinating    Diverticulosis of colon (without mention of hemorrhage)    mild   Dysuria    Edema    Essential hypertension, benign    Heart attack ([place]) 4332   Helicobacter pylori (H. 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[place])    3/12   History of anal fissures    History of cholelithiasis    Hyperlipidemia    Hypertrophy of prostate with urinary obstruction and other lower urinary tract symptoms (LUTS)    Impotence of organic origin    Internal hemorrhoids without mention of complication    Jaundice age 30   Long term (current) use of anticoagulants    Neutropenia ([place]) 10/07/2016   Nonspecific elevation of levels of transaminase or lactic acid dehydrogenase (LDH)    Other voice and resonance disorders    after surgery, took breathing tube out and damaged something   Pneumonia    Pulmonary embolism ([place]) 2012   s/p heart surgery   Pulmonary embolism ([place])    3/12   Rheumatic fever age 57   Skin cancer    Hx of squamous cell x2  Sleep apnea    no CPAP   Unspecified constipation     Patient Active Problem List   Diagnosis Date Noted   Benign prostatic hyperplasia with urinary frequency 10/20/2019   Senile purpura ([place]) 08/04/2019   Skin cancer of nose 03/25/2017   Hyperglycemia 10/07/2016   Pancreas cyst 06/04/2016   BCC (basal cell carcinoma of skin) 06/04/2016   Insomnia 05/07/2016   Diastasis recti 05/07/2016   Extrinsic asthma 03/14/2016   Toenail fungus 37/48/2707   Helicobacter pylori gastritis 09/07/2014   Benign essential microscopic hematuria 04/11/2014   CAD (coronary artery disease) of bypass graft 12/22/2013   Hyperlipidemia 12/22/2013   Hypertrophy of prostate 10/19/2013   Anxiety state 10/19/2013   Essential hypertension 04/13/2013   Spinal stenosis of lumbar region 09/10/1999   OCD (obsessive compulsive disorder) 03/11/1956    Past Surgical History:  Procedure  Laterality Date   CARDIAC CATHETERIZATION     CHOLECYSTECTOMY N/A 11/18/2013   Procedure: LAPAROSCOPIC CHOLECYSTECTOMY With IOC;  Surgeon: Odis Hollingshead, MD;  Location: [facility];  Service: General;  Laterality: N/A;   CORONARY ARTERY BYPASS GRAFT  2012   Median sternotomy, extracorporeal circulation, coronary   HERNIA REPAIR  2010   bilateral   HYDROCELE EXCISION     dr [name]    [facility] SURGERY     squamus x2; [facility] fo bridge of nose June 2017.   ROOT CANAL  12/2020   Pr patient he had complications    SCROTUM EXPLORATION  1990's   multiple   SPERMATOCELECTOMY  02/1999   Right, Dr.[name]   SUPERFICIAL SKIN CYSTECTOMY (l) HIP Right 1997   dr [name]   VASECTOMY  04/1992       Family History  Problem Relation Age of Onset   Bone cancer Mother    Diabetes Mother    Colon cancer Neg Hx    Esophageal cancer Neg Hx    Rectal cancer Neg Hx    Stomach cancer Neg Hx    Lung disease Neg Hx     Social History   Tobacco Use   Smoking status: Former    Packs/day: 1.00    Years: 22.00    Pack years: 22.00    Types: Cigarettes    Start date: 08/22/1956    Quit date: 12/08/1977    Years since quitting: 43.6   Smokeless tobacco: Never   Tobacco comments:    significant second-hand smoke exposure at work  Scientific laboratory technician Use: Never used  Substance Use Topics   Alcohol use: Yes    Alcohol/week: 0.0 standard drinks    Comment: former heavy alcohol use, rare use   Drug use: No    Home Medications Prior to Admission medications   Medication Sig Start Date End Date Taking? Authorizing Provider  acetaminophen (TYLENOL) 325 MG tablet Take 325 mg by mouth at bedtime.    [provider]  albuterol (PROVENTIL HFA;VENTOLIN HFA) 108 (90 BASE) MCG/ACT inhaler Inhale 2 puffs into the lungs every 6 (six) hours as needed for wheezing. 04/13/13   Estill Dooms, MD  ALPRAZolam Duanne Moron) 0.5 MG tablet TAKE 1 TO 2 TABLETS BY MOUTH EVERY DAY AS NEEDED FOR ANXIETY 03/12/21   Wardell Honour, MD  aspirin 81 MG tablet Take 81 mg by mouth 3 (three) times a week. Mondays, wednesdays and fridays    [provider]  COVID-19 mRNA Vac-TriS, Pfizer, (PFIZER-BIONT COVID-19 VAC-TRIS) SUSP injection Inject into the muscle. 06/19/21   Baxter Flattery,  Caren Griffins, MD  diazepam (VALIUM) 5 MG tablet TAKE 1 TO 2 TABLETS BY MOUTH AT BEDTIME AS NEEDED FOR REST 03/12/21   Wardell Honour, MD  Fluticasone Furoate [facility] SENSIMIST NA) Place 1 spray into both nostrils daily.    [provider]  hydrocortisone (ANUSOL-HC) 2.5 % rectal cream Place 1 application rectally 2 (two) times daily. Use pea sized amount rectally twice daily for 7 days 03/06/21   Mauri Pole, MD  montelukast (SINGULAIR) 10 MG tablet Take 1 tablet (10 mg total) by mouth at bedtime. 07/10/21   Ngetich, Dinah C, NP  nystatin (MYCOSTATIN) 100000 UNIT/ML suspension Take 5 mLs (500,000 Units total) by mouth 2 (two) times daily. 07/05/21   Ngetich, Dinah C, NP  rosuvastatin (CRESTOR) 10 MG tablet Take 1 tablet (10 mg total) by mouth daily. 01/03/15   Belva Crome, MD  saccharomyces boulardii (FLORASTOR) 250 MG capsule Take 1 capsule (250 mg total) by mouth 2 (two) times daily for 10 days. 07/10/21 07/20/21  Ngetich, Dinah C, NP    Allergies    Heparin, Penicillin g, Levaquin [levofloxacin in d5w], Aspirin, Codeine, Sucralfate, Zetia [ezetimibe], Zocor [simvastatin], and Plavix [clopidogrel bisulfate]  Review of Systems   Review of Systems  Respiratory:  Positive for shortness of breath.   All other systems reviewed and are negative.  Physical Exam Updated Vital Signs BP (!) 133/93 (BP Location: Right Arm)   Pulse (!) 48   Temp (!) 97.2 F (36.2 C) (Oral)   Resp 20   Ht 5' 9.5" (1.765 m)   Wt 80.3 kg   SpO2 98%   BMI 25.76 kg/m   Physical Exam Vitals and nursing note reviewed.  Constitutional:      General: He is not in acute distress.    Appearance: He is well-developed and normal weight.  HENT:     Head:  Normocephalic and atraumatic.     Mouth/Throat:     Mouth: Mucous membranes are moist.  Eyes:     Conjunctiva/sclera: Conjunctivae normal.     Pupils: Pupils are equal, round, and reactive to light.  Cardiovascular:     Rate and Rhythm: Normal rate. Rhythm irregularly irregular.     Heart sounds: No murmur heard. Pulmonary:     Effort: Pulmonary effort is normal. No respiratory distress.     Breath sounds: Normal breath sounds. No wheezing or rales.  Abdominal:     General: There is no distension.     Palpations: Abdomen is soft.     Tenderness: There is no abdominal tenderness. There is no guarding or rebound.  Musculoskeletal:        General: No tenderness. Normal range of motion.     Cervical back: Normal range of motion and neck supple.     Right lower leg: No edema.     Left lower leg: No edema.  Skin:    General: Skin is warm and dry.     Findings: No erythema or rash.  Neurological:     General: No focal deficit present.     Mental Status: He is alert and oriented to person, place, and time. Mental status is at baseline.  Psychiatric:        Behavior: Behavior normal.    ED Results / Procedures / Treatments   Labs (all labs ordered are listed, but only abnormal results are displayed) Labs Reviewed  BASIC METABOLIC PANEL - Abnormal; Notable for the following components:      Result Value  Glucose, Bld 147 (*)    All other components within normal limits  CBC  TROPONIN I (HIGH SENSITIVITY)  TROPONIN I (HIGH SENSITIVITY)    EKG EKG Interpretation  Date/Time:  Friday July 12 2021 13:20:34 EDT Ventricular Rate:  109 PR Interval:    QRS Duration: 70 QT Interval:  318 QTC Calculation: 428 R Axis:   81 Text Interpretation: recurrent Atrial flutter with variable A-V block Confirmed by Blanchie Dessert [id number]) on 07/12/2021 6:21:30 PM  Radiology DG Chest 2 View  Result Date: 07/12/2021 CLINICAL DATA:  Shortness of breath. EXAM: CHEST - 2 VIEW COMPARISON:   March 05, 2016. FINDINGS: Left atrial appendage clip. Prior CABG with median sternotomy. Similar cardiomediastinal silhouette. No consolidation. No visible pleural effusions or pneumothorax. Chronic hyperinflation. IMPRESSION: 1. No evidence of acute cardiopulmonary disease. 2. Chronic hyperinflation. 3. CABG and left atrial appendage clip. Electronically Signed   By: Margaretha Sheffield MD   On: 07/12/2021 14:17    Procedures Procedures   Medications Ordered in ED Medications  rivaroxaban (XARELTO) tablet 20 mg (has no administration in time range)  rivaroxaban (XARELTO) Education Kit for Afib patients (has no administration in time range)    ED Course  I have reviewed the triage vital signs and the nursing notes.  Pertinent labs & imaging results that were available during my care of the patient were reviewed by me and considered in my medical decision making (see chart for details).    MDM Rules/Calculators/A&P                           Patient is a pleasant 85 year old male presenting today with recurrent atrial fibrillation.  Patient has been going in and out of A. fib for the last 3 weeks.  He is not currently anticoagulated and is not a candidate for cardioversion.  EKG does confirm atrial fibrillation/a flutter here.  Heart rate is controlled with rates on my exam between 85 and 105.  Patient denies any systemic illness.  His labs are within normal limits.  He sees Dr. Daneen Schick and recommended that he make a follow-up appoint with him next week.  Because Chadvasc is a score of 4 patient was started on Xarelto 20 mg daily.  He currently is otherwise asymptomatic at this time.  He was given return precautions but will follow-up with cardiology next week.    MDM   Amount and/or Complexity of Data Reviewed Clinical lab tests: ordered and reviewed Tests in the radiology section of CPT: ordered and reviewed Tests in the medicine section of CPT: ordered and reviewed Independent  visualization of images, tracings, or specimens: yes    Final Clinical Impression(s) / ED Diagnoses Final diagnoses:  Paroxysmal atrial fibrillation ([place])    Rx / DC Orders ED Discharge Orders          Ordered    rivaroxaban (XARELTO) 20 MG TABS tablet  Daily with supper        07/12/21 1914             Blanchie Dessert, MD 07/12/21 1916

## 2021-07-12 NOTE — ED Triage Notes (Signed)
Reports 3 1/2 weeks of irregular HR and shob. Called nurse at [facility] and was told to come to the ED. History of A Fib. Denies chest pain, has some shob.

## 2021-07-15 ENCOUNTER — Telehealth: Payer: Self-pay | Admitting: Interventional Cardiology

## 2021-07-15 DIAGNOSIS — I4891 Unspecified atrial fibrillation: Secondary | ICD-10-CM

## 2021-07-15 MED ORDER — METOPROLOL SUCCINATE ER 25 MG PO TB24: 25.0000 mg | ORAL_TABLET | Freq: Every day | ORAL | 3 refills | Status: DC

## 2021-07-15 NOTE — Telephone Encounter (Signed)
Spoke with pt and he made me aware that he had been to the ER on Friday due to increased palps. Pt noted to be in AFlutter.  They started him on Xarelto '20mg'$  QD.  Pt states he took this Friday night around 7pm and on Saturday morning noticed his urine was dark and possibly had some blood in it.  Took a half tablet Saturday and it has cleared up.  Took no Xarelto on Sunday because he states it also makes him feel "weird", like he can't think straight..  He decided not to wear the monitor that we ordered because "he knew he was in AF".  States any kind of activity or bending over causes it to flare up.  Felt like he was back in rhythm Sunday from 8:30am-1230pm and then went back out of rhythm.  Rate on EKG at the hospital was 109.  Pt mentions that it fluctuates at home.  At times in 50s-60s then will jump to low 100s.  Advised I will make Dr. Tamala Julian aware and have him review info from hospital and then I will call back with his recommendations.  Pt would like a different blood thinner.

## 2021-07-15 NOTE — Telephone Encounter (Signed)
Please start Toprol-XL 25 mg/day.  Please wear a monitor for 7 days.  Need to determine if continuous atrial flutter with heart rate variation or if intermittent.  Needs an appointment to see EP to determine if flutter ablation as possible.

## 2021-07-15 NOTE — Telephone Encounter (Signed)
Spoke with pt and made him aware of information and recommendations per Dr. Tamala Julian.  Pt verbalized understanding.

## 2021-07-15 NOTE — Telephone Encounter (Signed)
Patient says that he has been having some problems and was told by Dr. Thompson Caul nurse that if he started having problems again, he should call in. Patient mentioned that he was in the ER on Friday with heart problems. Said that he only wanted to discuss with Dr. Tamala Julian or nurse

## 2021-07-16 ENCOUNTER — Telehealth: Payer: Self-pay | Admitting: Interventional Cardiology

## 2021-07-16 NOTE — Telephone Encounter (Signed)
Spoke with pt and he states he spoke with his nurse, Lelan Pons, at the [place] today and she asked him to have our office fax over our recent changes/plans so they could stay in the loop.  Fax number [phone].  Advised I will have those sent over.  Pt appreciative for call.

## 2021-07-16 NOTE — Telephone Encounter (Signed)
   Pt would like to speak with Dr. Thompson Caul nurse, he is requesting to have his [facility] [place]

## 2021-08-02 ENCOUNTER — Telehealth: Payer: Self-pay | Admitting: Interventional Cardiology

## 2021-08-02 DIAGNOSIS — I4891 Unspecified atrial fibrillation: Secondary | ICD-10-CM

## 2021-08-02 NOTE — Telephone Encounter (Signed)
Patient is requesting to discuss his [employer] Monitor. He states the order for it expired and he has questions. Please return call when able.

## 2021-08-02 NOTE — Telephone Encounter (Signed)
Pt states that the monitor company advised him that his order has now expired so he will need a new order.  Advised I will place a new order so they can send out a new one.

## 2021-08-05 ENCOUNTER — Telehealth: Payer: Self-pay | Admitting: Interventional Cardiology

## 2021-08-05 ENCOUNTER — Encounter: Payer: Self-pay | Admitting: *Deleted

## 2021-08-05 NOTE — Telephone Encounter (Signed)
Pt states he is unable to tolerate Xarelto or Eliquis.  Was originally on Xarelto and would get SOB, dizzy, chest tightness and felt weak.  If he bent over he felt like he was going to pass out.  Had symptoms since taking the second dose.  Was switched to Elquis.  Took it for the first time on Saturday and states that's the worse he has ever felt.  Became immobile and weak.  Feels AF episodes are much more frequent than they use to be and are lasting a lot longer.  Use to have episodes occasionally that lasted about 8 hours.  Episodes are now lasting days at a time.  Denies worsening SOB when lying down.  Currently not taking either blood thinner and only taking ASA 3x per week.  Pt is going to try taking Xarelto again this evening and see Dr. Quentin Ore tomorrow and let him know how he did.  Pt appreciative for call.

## 2021-08-05 NOTE — Telephone Encounter (Signed)
Pt c/o medication issue:  1. Name of Medication:  rivaroxaban (XARELTO) 20 MG TABS tablet Eliquis 5 MG 2. How are you currently taking this medication (dosage and times per day)? Xarelto 1 tablet by mouth daily at supper, not currently taking Eliquis   3. Are you having a reaction (difficulty breathing--STAT)? Yes   4. What is your medication issue? Having side effects of trouble breathing, dizziness, shallow breathing, heaviness in chest, and completely out of breathe when taking either medications. Reports he is having issues with Afib as well.    Patient c/o Palpitations:  High priority if patient c/o lightheadedness, shortness of breath, or chest pain  How long have you had palpitations/irregular HR/ Afib? Are you having the symptoms now? 5 or 6 weeks now, is having symptoms now   Are you currently experiencing lightheadedness, SOB or CP? No, just slight heaviness in breathing which is not unusual   Do you have a history of afib (atrial fibrillation) or irregular heart rhythm? Yes  Have you checked your BP or HR? (document readings if available): yes, HR has ranged 50's-100's  Oxygen 94-97  Are you experiencing any other symptoms? See previous dot phrase    Wanting to be worked in for an appointment with Dr. Tamala Julian in regards to it. Requesting a callback from [place] to discuss further.

## 2021-08-05 NOTE — Progress Notes (Signed)
Patient ID: Alexander [name], male   DOB: 1936-07-21, 85 y.o.   MRN: [id number] Patient enrolled for [employer] to ship a 7 day cardiac event monitor to his home. Letter with instructions mailed to patient.

## 2021-08-06 ENCOUNTER — Other Ambulatory Visit: Payer: Self-pay

## 2021-08-06 ENCOUNTER — Ambulatory Visit: Payer: Medicare Other | Admitting: Cardiology

## 2021-08-06 ENCOUNTER — Encounter: Payer: Self-pay | Admitting: Cardiology

## 2021-08-06 VITALS — BP 124/68 | HR 76 | Ht 69.0 in | Wt 182.0 lb

## 2021-08-06 DIAGNOSIS — I4819 Other persistent atrial fibrillation: Secondary | ICD-10-CM | POA: Diagnosis not present

## 2021-08-06 DIAGNOSIS — I480 Paroxysmal atrial fibrillation: Secondary | ICD-10-CM

## 2021-08-06 DIAGNOSIS — I10 Essential (primary) hypertension: Secondary | ICD-10-CM | POA: Diagnosis not present

## 2021-08-06 DIAGNOSIS — I25810 Atherosclerosis of coronary artery bypass graft(s) without angina pectoris: Secondary | ICD-10-CM | POA: Diagnosis not present

## 2021-08-06 DIAGNOSIS — Z862 Personal history of diseases of the blood and blood-forming organs and certain disorders involving the immune mechanism: Secondary | ICD-10-CM

## 2021-08-06 DIAGNOSIS — G4733 Obstructive sleep apnea (adult) (pediatric): Secondary | ICD-10-CM

## 2021-08-06 NOTE — Patient Instructions (Addendum)
Medication Instructions:  Your physician recommends that you continue on your current medications as directed. Please refer to the Current Medication list given to you today.  Labwork: None ordered.  Testing/Procedures: None ordered.  Follow-Up: Call the office if you want to move forward with Dofetilide. [id number] ask for Sonia Baller.  Your physician wants you to follow-up in: 6 months with  Lars Mage, MD or one of the following Advanced Practice Providers on your designated Care Team:    Tommye Standard, [place] Legrand Como "Jonni Sanger" [place], [place]   You will receive a reminder letter in the mail two months in advance. If you don't receive a letter, please call our office to schedule the follow-up appointment.   Any Other Special Instructions Will Be Listed Below (If Applicable).  If you need a refill on your cardiac medications before your next appointment, please call your pharmacy.   Dofetilide capsules What is this medication? DOFETILIDE (doe FET il ide) is an antiarrhythmic drug. It helps make your heartbeat regularly. This medicine also helps to slow rapid heartbeats. This medicine may be used for other purposes; ask your health care provider orpharmacist if you have questions. COMMON BRAND NAME(S): Tikosyn What should I tell my care team before I take this medication? They need to know if you have any of these conditions: heart disease history of irregular heartbeat history of low levels of potassium or magnesium in the blood kidney disease liver disease an unusual or allergic reaction to dofetilide, other medicines, foods, dyes, or preservatives pregnant or trying to get pregnant breast-feeding How should I use this medication? Take this medicine by mouth with a glass of water. Follow the directions on the prescription label. Do not take with grapefruit juice. You can take it with or without food. If it upsets your stomach, take it with food. Take your medicine at regular  intervals. Do not take it more often than directed. Do not stoptaking except on your doctor's advice. A special MedGuide will be given to you by the pharmacist with eachprescription and refill. Be sure to read this information carefully each time. Talk to your pediatrician regarding the use of this medicine in children.Special care may be needed. Overdosage: If you think you have taken too much of this medicine contact apoison control center or emergency room at once. NOTE: This medicine is only for you. Do not share this medicine with others. What if I miss a dose? If you miss a dose, skip it. Take your next dose at the normal time. Do nottake extra or 2 doses at the same time to make up for the missed dose. What may interact with this medication? Do not take this medicine with any of the following medications: cimetidine cisapride dolutegravir dronedarone erdafitinib hydrochlorothiazide ketoconazole megestrol pimozide prochlorperazine thioridazine trimethoprim verapamil This medicine may also interact with the following medications: amiloride cannabinoids certain antibiotics like erythromycin or clarithromycin certain antiviral medicines for HIV or hepatitis certain medicines for depression, anxiety, or psychotic disorders digoxin diltiazem grapefruit juice metformin nefazodone other medicines that prolong the QT interval (an abnormal heart rhythm) quinine triamterene zafirlukast ziprasidone This list may not describe all possible interactions. Give your health care provider a list of all the medicines, herbs, non-prescription drugs, or dietary supplements you use. Also tell them if you smoke, drink alcohol, or use illegaldrugs. Some items may interact with your medicine. What should I watch for while using this medication? Your condition will be monitored carefully while you are receiving thismedicine. What side effects may  I notice from receiving this medication? Side  effects that you should report to your doctor or health care professionalas soon as possible: allergic reactions like skin rash, itching or hives, swelling of the face, lips, or tongue breathing problems chest pain or chest tightness dizziness signs and symptoms of a dangerous change in heartbeat or heart rhythm like chest pain; dizziness; fast or irregular heartbeat; palpitations; feeling faint or lightheaded, falls; breathing problems signs and symptoms of electrolyte imbalance like severe diarrhea, unusual sweating, vomiting, loss of appetite, increased thirst swelling of the ankles, legs, or feet tingling, numbness in the hands or feet Side effects that usually do not require medical attention (report to yourdoctor or health care professional if they continue or are bothersome): diarrhea general ill feeling or flu-like symptoms headache nausea trouble sleeping stomach pain This list may not describe all possible side effects. Call your doctor for medical advice about side effects. You may report side effects to FDA at1-800-FDA-1088. Where should I keep my medication? Keep out of the reach of children. Store at room temperature between 15 and 30 degrees C (59 and 86 degrees F).Throw away any unused medicine after the expiration date. NOTE: This sheet is a summary. It may not cover all possible information. If you have questions about this medicine, talk to your doctor, pharmacist, orhealth care provider.  2022 Elsevier/Gold Standard (2019-10-25 12:14:05)    Tikosyn (Dofetilide) Hospital Admission  Prior to day of admission: Check with drug insurance company for cost of drug to ensure affordability --- Dofetilide 500 mcg twice a day.  GoodRx is an option if insurance copay is unaffordable.  All patients are tested for COVID-19 prior to admission.  No Benadryl is allowed 3 days prior to admission.  Please ensure no missed doses of your anticoagulation (blood thinner) for 3 weeks  prior to admission. If a dose is missed please notify our office immediately.  A pharmacist will review all your medications for potential interactions with Tikosyn. If any medication changes are needed prior to admission we will be in touch with you.  If any new medications are started AFTER your admission date is set with Nurse, adult. Please notify our office immediately so your medication list can be updated and reviewed by our pharmacist again. On day of admission: Tikosyn initiation requires a 3 night/4 day hospital stay with constant telemetry monitoring. You will have an EKG after each dose of Tikosyn as well as daily lab draws.  If the drug does not convert you to normal rhythm a cardioversion after the 4th dose of Tikosyn.  [facility] office visit on the morning of admission is needed for preliminary labs/ekg.  Time of admission is dependent on bed availability in the hospital. In some instances, you will be sent home until bed is available. Rarely admission can be delayed to the following day if hospital census prevents available beds.  You may bring personal belongings/clothing with you to the hospital. Please leave your suitcase in the car until you arrive in admissions.  Questions please call our office at [phone]

## 2021-08-06 NOTE — Progress Notes (Signed)
Electrophysiology Office Note:    Date:  08/06/2021   ID:  Koalii, Ings [birth year], MRN [id number]  PCP:  Wardell Honour, MD  [facility] Cardiologist:  Sinclair Grooms, MD  [facility] Electrophysiologist:  Vickie Epley, MD   Referring MD: Belva Crome, MD   Chief Complaint: Atrial fibrillation and flutter  History of Present Illness:    Alexander [name] is a 85 y.o. male who presents for an evaluation of atrial fibrillation and flutter at the request of Dr. Tamala Julian. Their medical history includes coronary artery disease post bypass surgery in 2012 with concomitant Maze procedure.  He also has a medical history of hypertension, hyperlipidemia, pulmonary embolism, heparin-induced thrombocytopenia.  The patient was recently seen in the emergency department on July 12, 2021 for atrial fibrillation with rapid ventricular rates.  The patient tells me that he has historically had atrial fibrillation only at night.  He tells me this has been going on for decades.  After his maze procedure he had about 10 years of being free of atrial fibrillation altogether.  It is recently come back and now is much less predictable.  He is having salvos of atrial fibrillation during the daytime and nighttime hours.  They are symptomatic.  When he presented to the emergency department he was not on anticoagulation so he was not cardioverted.  He was started on Xarelto for stroke prophylaxis.  Past Medical History:  Diagnosis Date   Allergic rhinitis due to pollen    Anginal pain ([place])    once in a while, none recent   Anxiety    Anxiety disorder    Asthma    exercise induced   Atrial fibrillation (HCC)    hx of   Basal cell carcinoma of skin of lip    Clotting disorder (HCC)    Coronary atherosclerosis of native coronary artery    with CABG 3/12  -MAZE procedure    Diastasis recti 05/07/2016   Difficulty urinating    Diverticulosis of colon (without mention of hemorrhage)    mild    Dysuria    Edema    Essential hypertension, benign    Heart attack ([place]) 0000000   Helicobacter pylori (H. 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[place])    3/12   History of anal fissures    History of cholelithiasis    Hyperlipidemia    Hypertrophy of prostate with urinary obstruction and other lower urinary tract symptoms (LUTS)    Impotence of organic origin    Internal hemorrhoids without mention of complication    Jaundice age 82   Long term (current) use of anticoagulants    Neutropenia ([place]) 10/07/2016   Nonspecific elevation of levels of transaminase or lactic acid dehydrogenase (LDH)    Other voice and resonance disorders    after surgery, took breathing tube out and damaged something   Pneumonia    Pulmonary embolism ([place]) 2012   s/p heart surgery   Pulmonary embolism ([place])    3/12   Rheumatic fever age 15   Skin cancer    Hx of squamous cell x2   Sleep apnea    no CPAP   Unspecified constipation     Past Surgical History:  Procedure Laterality Date   CARDIAC CATHETERIZATION     CHOLECYSTECTOMY N/A 11/18/2013   Procedure: LAPAROSCOPIC CHOLECYSTECTOMY With [place];  Surgeon: Odis Hollingshead, MD;  Location: [facility];  Service: General;  Laterality: N/A;   CORONARY  ARTERY BYPASS GRAFT  2012   Median sternotomy, extracorporeal circulation, coronary   HERNIA REPAIR  2010   bilateral   HYDROCELE EXCISION     dr [name]    [facility] SUR[facility]ERY     squamus x2; [facility] fo bridge of nose June 2017.   ROOT CANAL  12/2020   Pr patient he had complications    SCROTUM EXPLORATION  1990's   multiple   SPERMATOCELECTOMY  02/1999   Right, Dr.[name]   SUPERFICIAL SKIN CYSTECTOMY (l) HIP Right 1997   dr [name]   VASECTOMY  04/1992    Current Medications: Current Meds  Medication Sig   acetaminophen (TYLENOL) 325 MG tablet Take 325 mg by mouth at bedtime.   albuterol (PROVENTIL HFA;VENTOLIN HFA) 108 (90 BASE) MCG/ACT inhaler Inhale 2 puffs into the lungs every 6 (six) hours as  needed for wheezing.   ALPRAZolam (XANAX) 0.5 MG tablet TAKE 1 TO 2 TABLETS BY MOUTH EVERY DAY AS NEEDED FOR ANXIETY   aspirin 81 MG tablet Take 81 mg by mouth 3 (three) times a week. Mondays, wednesdays and fridays   COVID-19 mRNA Vac-TriS, Pfizer, (PFIZER-BIONT COVID-19 VAC-TRIS) SUSP injection Inject into the muscle.   diazepam (VALIUM) 5 MG tablet TAKE 1 TO 2 TABLETS BY MOUTH AT BEDTIME AS NEEDED FOR REST   Fluticasone Furoate (FLONASE SENSIMIST NA) Place 1 spray into both nostrils daily.   hydrocortisone (ANUSOL-HC) 2.5 % rectal cream Place 1 application rectally 2 (two) times daily. Use pea sized amount rectally twice daily for 7 days   metoprolol succinate (TOPROL XL) 25 MG 24 hr tablet Take 1 tablet (25 mg total) by mouth daily.   montelukast (SINGULAIR) 10 MG tablet Take 1 tablet (10 mg total) by mouth at bedtime.   nystatin (MYCOSTATIN) 100000 UNIT/ML suspension Take 5 mLs (500,000 Units total) by mouth 2 (two) times daily.   rivaroxaban (XARELTO) 20 MG TABS tablet Take 1 tablet (20 mg total) by mouth daily with supper.   rosuvastatin (CRESTOR) 10 MG tablet Take 1 tablet (10 mg total) by mouth daily.     Allergies:   Heparin, Penicillin g, Levaquin [levofloxacin in d5w], Aspirin, Codeine, Sucralfate, Zetia [ezetimibe], Zocor [simvastatin], and Plavix [clopidogrel bisulfate]   Social History   Socioeconomic History   Marital status: Married    Spouse name: Not on file   Number of children: 1   Years of education: Not on file   Highest education level: Not on file  Occupational History   Occupation: Presenter, broadcasting: [employer]  Tobacco Use   Smoking status: Former    Packs/day: 1.00    Years: 22.00    Pack years: 22.00    Types: Cigarettes    Start date: 08/22/1956    Quit date: 12/08/1977    Years since quitting: 43.6   Smokeless tobacco: Never   Tobacco comments:    significant second-hand smoke exposure at work  Scientific laboratory technician Use: Never used   Substance and Sexual Activity   Alcohol use: Yes    Alcohol/week: 0.0 standard drinks    Comment: former heavy alcohol use, rare use   Drug use: No   Sexual activity: Yes    Partners: Female  Other Topics Concern   Not on file  Social History Narrative   Daily caffeine       [name]   [name]   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 No pets currently. No hot tub or bird exposure.    Married wife Social research officer, government   Social Determinants of Radio broadcast assistant Strain: Not on file  Food Insecurity: Not on file  Transportation Needs: Not on file  Physical Activity: Not on file  Stress: Not on file  Social Connections: Not on file     Family History: The patient's family history includes Bone cancer in his mother; Diabetes in his mother. There is no history of Colon cancer, Esophageal cancer, Rectal cancer, Stomach cancer, or Lung disease.  ROS:   Please see the history of present illness.    All other systems reviewed and are negative.  EKGs/Labs/Other Studies Reviewed:    The following studies were reviewed today: Hospitalization records  EKG:  The ekg ordered today demonstrates sinus rhythm with a long first-degree AV delay of 250 ms.  The QTc is 398 ms.  Creatinine on July 12, 2021 was 1.12.     Recent Labs: 01/07/2021: ALT 25 07/12/2021: BUN 17; Creatinine, Ser 1.12; Hemoglobin 16.3; Platelets 211; Potassium 4.8; Sodium 138  Recent Lipid Panel    Component Value Date/Time   CHOL 163 01/07/2021 0831   CHOL 168 05/02/2016 0826   TRIG 80 01/07/2021  0831   HDL 55 01/07/2021 0831   HDL 56 05/02/2016 0826   CHOLHDL 3.0 01/07/2021 0831   VLDL 17 10/07/2016 0950   LDLCALC 91 01/07/2021 0831    Physical Exam:    VS:  BP 124/68   Pulse 76   Ht '5\' 9"'$  (1.753 m)   Wt 182 lb (82.6 kg)   BMI 26.88 kg/m     Wt Readings from Last 3 Encounters:  08/06/21 182 lb (82.6 kg)  07/12/21 177 lb (80.3 kg)  07/10/21 181 lb 12.8 oz (82.5 kg)     GEN:  Well nourished, well developed in no acute distress.  Appears younger than the stated age 30: Normal NECK: No JVD; No carotid bruits LYMPHATICS: No lymphadenopathy CARDIAC: RRR, no murmurs, rubs, gallops RESPIRATORY:  Clear to auscultation without rales, wheezing or rhonchi  ABDOMEN: Soft, non-tender, non-distended MUSCULOSKELETAL:  No edema; No deformity  SKIN: Warm and dry NEUROLOGIC:  Alert and oriented x 3 PSYCHIATRIC:  Normal affect   ASSESSMENT:    1. Persistent atrial fibrillation ([place])   2. Essential hypertension   3. Coronary artery disease involving coronary bypass graft of native heart without angina pectoris   4. Obstructive sleep apnea   5. History of heparin-induced thrombocytopenia    PLAN:    In order of problems listed above:  1. Persistent atrial fibrillation (HCC) Symptomatic.  Patient with history of Maze procedure in 2012 at the time of his bypass surgery.  We discussed options for rhythm control with his atrial fibrillation and flutter.  Given his history of heparin-induced thrombocytopenia, ablation is not an ideal upfront choice.  I would like to try using Tikosyn first to manage his arrhythmia.  If dofetilide fails, could consider ablation.  Ablation would have to be performed with bivalirudin for intraprocedural anticoagulation given his history of HIT.  I discussed dofetilide in detail with the patient during today's visit.  He would like some time to think about it prior to  making a final decision which I think is very reasonable.  He will call us and let us  know if he would like to proceed with loading.  2. Essential hypertension Controlled.  Continue metoprolol.  Recommend checking blood pressures at home 1-2 times per week.  3. Coronary artery disease involving coronary bypass graft of native heart without angina pectoris No ischemic symptoms  4. Obstructive sleep apnea CPAP encouraged  5. History of heparin-induced thrombocytopenia Would necessitate the use of bivalirudin during ablation procedure.     Follow-up 6 months with NP/PA.   Total time spent with patient today 65 minutes. This includes reviewing records, evaluating the patient and coordinating care.  Medication Adjustments/Labs and Tests Ordered: Current medicines are reviewed at length with the patient today.  Concerns regarding medicines are outlined above.  Orders Placed This Encounter  Procedures   EKG 12-Lead   No orders of the defined types were placed in this encounter.    Signed, Hilton Cork. Quentin Ore, MD, [facility], [facility] 08/06/2021 9:41 PM    Electrophysiology [facility]

## 2021-08-07 ENCOUNTER — Encounter: Payer: Self-pay | Admitting: Family Medicine

## 2021-08-07 ENCOUNTER — Ambulatory Visit (INDEPENDENT_AMBULATORY_CARE_PROVIDER_SITE_OTHER): Payer: Medicare Other | Admitting: Family Medicine

## 2021-08-07 VITALS — BP 130/70 | HR 80 | Temp 97.3°F | Ht 69.0 in | Wt 185.2 lb

## 2021-08-07 DIAGNOSIS — I480 Paroxysmal atrial fibrillation: Secondary | ICD-10-CM | POA: Diagnosis not present

## 2021-08-07 DIAGNOSIS — I10 Essential (primary) hypertension: Secondary | ICD-10-CM

## 2021-08-07 DIAGNOSIS — F411 Generalized anxiety disorder: Secondary | ICD-10-CM

## 2021-08-07 DIAGNOSIS — I4891 Unspecified atrial fibrillation: Secondary | ICD-10-CM | POA: Insufficient documentation

## 2021-08-07 NOTE — Progress Notes (Signed)
Provider:  Alain Honey, MD  Careteam: Patient Care Team: Wardell Honour, MD as PCP - General (Family Medicine) Belva Crome, MD as PCP - Cardiology (Cardiology) Vickie Epley, MD as PCP - Electrophysiology (Cardiology) Javier Glazier, MD as Consulting Physician (Pulmonary Disease) Danella Sensing, MD as Consulting Physician (Dermatology) Jessy Oto, MD as Consulting Physician (Orthopedic Surgery) Irine Seal, MD as Attending Physician (Urology) Jackolyn Confer, MD as Consulting Physician (General Surgery) Melrose Nakayama, MD as Consulting Physician (Cardiothoracic Surgery) Rozetta Nunnery, MD as Consulting Physician (Otolaryngology) Griselda Miner, MD as Consulting Physician (Dermatology)  PLACE OF SERVICE:  [place]  Advanced Directive information    Allergies  Allergen Reactions   Heparin     Blood clots    Penicillin G Shortness Of Breath   Levaquin [Levofloxacin In D5w] Other (See Comments)    Patient felt jittery and nervous with insomnia on the medication.   Aspirin     Breathing complications, can tolerate baby asa   Codeine     Some is ok   Sucralfate     Could not sleep   Zetia [Ezetimibe]     jittery   Zocor [Simvastatin]     Liver enzymes increased   Plavix [Clopidogrel Bisulfate] Palpitations    Chief Complaint  Patient presents with   Medical Management of Chronic Issues    Patient presents today for a 6 month follow-up.   Quality Metric Gaps    Zoster and Flu vaccines     HPI: Patient is a 85 y.o. male routine 53-monthfollow-up.  He has recently had a recurrence of his atrial fib.  He had a Maze procedure about 10 years ago and has not had any problems but recently developed palpitations and went to the emergency room.  Since then he has been scheduled for an event monitor and has followed up with cardiologist as recent as yesterday.  He has some problems taking anticoagulants and had been on baby aspirin  before per Dr. HDaneen Schick  There is some discussion about some newer antiarrhythmics as well as ablation although he would probably not be a candidate for that since he cannot be anticoagulated. I think there are several anxiety issues that figure into his A. fib.  He is doing some walking which helps anxiety but also uses diazepam as well as alprazolam at nighttime  Review of Systems:  Review of Systems  Cardiovascular:  Positive for palpitations.  Psychiatric/Behavioral:  The patient is nervous/anxious.   All other systems reviewed and are negative.  Past Medical History:  Diagnosis Date   Allergic rhinitis due to pollen    Anginal pain ([place]    once in a while, none recent   Anxiety    Anxiety disorder    Asthma    exercise induced   Atrial fibrillation (HCC)    hx of   Basal cell carcinoma of skin of lip    Clotting disorder (HCC)    Coronary atherosclerosis of native coronary artery    with CABG 3/12  -MAZE procedure    Diastasis recti 05/07/2016   Difficulty urinating    Diverticulosis of colon (without mention of hemorrhage)    mild   Dysuria    Edema    Essential hypertension, benign    Heart attack ([place] [id number]  Helicobacter pylori (H. 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[place]    3/12   History of anal fissures    History  of cholelithiasis    Hyperlipidemia    Hypertrophy of prostate with urinary obstruction and other lower urinary tract symptoms (LUTS)    Impotence of organic origin    Internal hemorrhoids without mention of complication    Jaundice age 22   Long term (current) use of anticoagulants    Neutropenia ([place]) 10/07/2016   Nonspecific elevation of levels of transaminase or lactic acid dehydrogenase (LDH)    Other voice and resonance disorders    after surgery, took breathing tube out and damaged something   Pneumonia    Pulmonary embolism ([place]) 2012   s/p heart surgery   Pulmonary embolism ([place])    3/12   Rheumatic fever age 61   Skin  cancer    Hx of squamous cell x2   Sleep apnea    no CPAP   Unspecified constipation    Past Surgical History:  Procedure Laterality Date   CARDIAC CATHETERIZATION     CHOLECYSTECTOMY N/A 11/18/2013   Procedure: LAPAROSCOPIC CHOLECYSTECTOMY With [place];  Surgeon: Odis Hollingshead, MD;  Location: [facility];  Service: General;  Laterality: N/A;   CORONARY ARTERY BYPASS GRAFT  2012   Median sternotomy, extracorporeal circulation, coronary   HERNIA REPAIR  2010   bilateral   HYDROCELE EXCISION     dr [name]    [facility] SURGERY     squamus x2; [facility] fo bridge of nose June 2017.   ROOT CANAL  12/2020   Pr patient he had complications    SCROTUM EXPLORATION  1990's   multiple   SPERMATOCELECTOMY  02/1999   Right, Dr.[name]   SUPERFICIAL SKIN CYSTECTOMY (l) HIP Right 1997   dr [name]   VASECTOMY  04/1992   Social History:   reports that he quit smoking about 43 years ago. His smoking use included cigarettes. He started smoking about 65 years ago. He has a 22.00 pack-year smoking history. He has never used smokeless tobacco. He reports current alcohol use. He reports that he does not use drugs.  Family History  Problem Relation Age of Onset   Bone cancer Mother    Diabetes Mother    Colon cancer Neg Hx    Esophageal cancer Neg Hx    Rectal cancer Neg Hx    Stomach cancer Neg Hx    Lung disease Neg Hx     Medications: Patient's Medications  New Prescriptions   No medications on file  Previous Medications   ACETAMINOPHEN (TYLENOL) 325 MG TABLET    Take 325 mg by mouth at bedtime.   ALBUTEROL (PROVENTIL HFA;VENTOLIN HFA) 108 (90 BASE) MCG/ACT INHALER    Inhale 2 puffs into the lungs every 6 (six) hours as needed for wheezing.   ALPRAZOLAM (XANAX) 0.5 MG TABLET    TAKE 1 TO 2 TABLETS BY MOUTH EVERY DAY AS NEEDED FOR ANXIETY   ASPIRIN 81 MG TABLET    Take 81 mg by mouth 3 (three) times a week. Mondays, wednesdays and fridays   COVID-19 MRNA VAC-TRIS, PFIZER, (PFIZER-BIONT COVID-19 VAC-TRIS)  SUSP INJECTION    Inject into the muscle.   DIAZEPAM (VALIUM) 5 MG TABLET    TAKE 1 TO 2 TABLETS BY MOUTH AT BEDTIME AS NEEDED FOR REST   FLUTICASONE FUROATE (FLONASE SENSIMIST NA)    Place 1 spray into both nostrils daily.   HYDROCORTISONE (ANUSOL-HC) 2.5 % RECTAL CREAM    Place 1 application rectally 2 (two) times daily. Use pea sized amount rectally twice daily for 7 days  METOPROLOL SUCCINATE (TOPROL XL) 25 MG 24 HR TABLET    Take 1 tablet (25 mg total) by mouth daily.   MONTELUKAST (SINGULAIR) 10 MG TABLET    Take 1 tablet (10 mg total) by mouth at bedtime.   NYSTATIN (MYCOSTATIN) 100000 UNIT/ML SUSPENSION    Take 5 mLs (500,000 Units total) by mouth 2 (two) times daily.   RIVAROXABAN (XARELTO) 20 MG TABS TABLET    Take 1 tablet (20 mg total) by mouth daily with supper.   ROSUVASTATIN (CRESTOR) 10 MG TABLET    Take 1 tablet (10 mg total) by mouth daily.  Modified Medications   No medications on file  Discontinued Medications   No medications on file    Physical Exam:  Vitals:   08/07/21 1036  BP: 130/70  Pulse: 80  Temp: (!) 97.3 F (36.3 C)  TempSrc: Temporal  SpO2: 97%  Weight: 185 lb 3.2 oz (84 kg)  Height: '5\' 9"'$  (1.753 m)   Body mass index is 27.35 kg/m. Wt Readings from Last 3 Encounters:  08/07/21 185 lb 3.2 oz (84 kg)  08/06/21 182 lb (82.6 kg)  07/12/21 177 lb (80.3 kg)    Physical Exam Vitals and nursing note reviewed.  Constitutional:      Appearance: Normal appearance.  Cardiovascular:     Rate and Rhythm: Normal rate and regular rhythm.     Comments: Appears to be in sinus rhythm today Pulmonary:     Effort: Pulmonary effort is normal.     Breath sounds: Normal breath sounds.  Neurological:     General: No focal deficit present.     Mental Status: He is alert and oriented to person, place, and time.  Psychiatric:        Mood and Affect: Mood normal.        Behavior: Behavior normal.    Labs reviewed: Basic Metabolic Panel: Recent Labs     01/07/21 0831 07/12/21 1329  NA 141 138  K 4.9 4.8  CL 104 100  CO2 33* 31  GLUCOSE 101* 147*  BUN 11 17  CREATININE 1.04 1.12  CALCIUM 9.3 9.2   Liver Function Tests: Recent Labs    01/07/21 0831  AST 25  ALT 25  BILITOT 1.3*  PROT 6.6   No results for input(s): LIPASE, AMYLASE in the last 8760 hours. No results for input(s): AMMONIA in the last 8760 hours. CBC: Recent Labs    01/07/21 0831 07/12/21 1329  WBC 6.4 8.1  NEUTROABS 3,482  --   HGB 16.3 16.3  HCT 46.7 47.5  MCV 94.0 93.3  PLT 230 211   Lipid Panel: Recent Labs    01/07/21 0831  CHOL 163  HDL 55  LDLCALC 91  TRIG 80  CHOLHDL 3.0   TSH: No results for input(s): TSH in the last 8760 hours. A1C: Lab Results  Component Value Date   HGBA1C 5.9 (H) 01/28/2021     Assessment/Plan  1. Anxiety state Continue benzodiazepines as before and continue walking.  I think he may have the risk of obsessing over his heart rhythms since it has recurred I encouraged him to try to put that out of his mind.    3. Paroxysmal atrial fibrillation (HCC) Recurrent A. fib in process of further evaluation and disposition.  He is taking metoprolol and will try to restart the rapid acting anticoagulant, Xarelto tonight.  Do not have high hopes about use of anticoagulants since he has had side effects in the past  chiefly shortness of breath.  He may be a candidate for aspirin therapy which she took successfully for several years but I suspect he was also in sinus rhythm after his maze procedure during that time   Alain Honey, MD [facility] [phone]

## 2021-08-14 ENCOUNTER — Ambulatory Visit (INDEPENDENT_AMBULATORY_CARE_PROVIDER_SITE_OTHER): Payer: Medicare Other | Admitting: Otolaryngology

## 2021-08-14 ENCOUNTER — Other Ambulatory Visit: Payer: Self-pay

## 2021-08-14 DIAGNOSIS — R432 Parageusia: Secondary | ICD-10-CM

## 2021-08-14 NOTE — Progress Notes (Signed)
HPI: Alexander [name] is a 85 y.o. male who returns today for evaluation of altered taste that occurred rather suddenly on a Saturday a couple months ago.  He denies any difficulty with his smell.  This occurred shortly after receiving a second COVID booster.  He denies having had COVID. He does relate that he had a dental extraction performed the first part of this year with a lot of pain associated with this.  But he had no altered taste at that time. He has had a upper denture for over 30years. The couple of days ago he regained taste back to close to normal. He describes more of an altered taste and loss of taste.  He states that he is able to taste and orange..  Past Medical History:  Diagnosis Date   Allergic rhinitis due to pollen    Anginal pain ([place])    once in a while, none recent   Anxiety    Anxiety disorder    Asthma    exercise induced   Atrial fibrillation (HCC)    hx of   Basal cell carcinoma of skin of lip    Clotting disorder (HCC)    Coronary atherosclerosis of native coronary artery    with CABG 3/12  -MAZE procedure    Diastasis recti 05/07/2016   Difficulty urinating    Diverticulosis of colon (without mention of hemorrhage)    mild   Dysuria    Edema    Essential hypertension, benign    Heart attack ([place]) 0000000   Helicobacter pylori (H. 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[place])    3/12   History of anal fissures    History of cholelithiasis    Hyperlipidemia    Hypertrophy of prostate with urinary obstruction and other lower urinary tract symptoms (LUTS)    Impotence of organic origin    Internal hemorrhoids without mention of complication    Jaundice age 72   Long term (current) use of anticoagulants    Neutropenia ([place]) 10/07/2016   Nonspecific elevation of levels of transaminase or lactic acid dehydrogenase (LDH)    Other voice and resonance disorders    after surgery, took breathing tube out and damaged something   Pneumonia    Pulmonary  embolism ([place]) 2012   s/p heart surgery   Pulmonary embolism ([place])    3/12   Rheumatic fever age 69   Skin cancer    Hx of squamous cell x2   Sleep apnea    no CPAP   Unspecified constipation    Past Surgical History:  Procedure Laterality Date   CARDIAC CATHETERIZATION     CHOLECYSTECTOMY N/A 11/18/2013   Procedure: LAPAROSCOPIC CHOLECYSTECTOMY With Diagonal;  Surgeon: Odis Hollingshead, MD;  Location: [facility];  Service: General;  Laterality: N/A;   CORONARY ARTERY BYPASS GRAFT  2012   Median sternotomy, extracorporeal circulation, coronary   HERNIA REPAIR  2010   bilateral   HYDROCELE EXCISION     dr [name]    [facility] SURGERY     squamus x2; [facility] fo bridge of nose June 2017.   ROOT CANAL  12/2020   Pr patient he had complications    SCROTUM EXPLORATION  1990's   multiple   SPERMATOCELECTOMY  02/1999   Right, Dr.[name]   SUPERFICIAL SKIN CYSTECTOMY (l) HIP Right 1997   dr [name]   VASECTOMY  04/1992   Social History   Socioeconomic History   Marital status: Married    Spouse  name: Not on file   Number of children: 1   Years of education: Not on file   Highest education level: Not on file  Occupational History   Occupation: Presenter, broadcasting: [employer]  Tobacco Use   Smoking status: Former    Packs/day: 1.00    Years: 22.00    Pack years: 22.00    Types: Cigarettes    Start date: 08/22/1956    Quit date: 12/08/1977    Years since quitting: 43.7   Smokeless tobacco: Never   Tobacco comments:    significant second-hand smoke exposure at work  Scientific laboratory technician Use: Never used  Substance and Sexual Activity   Alcohol use: Yes    Alcohol/week: 0.0 standard drinks    Comment: former heavy alcohol use, rare use   Drug use: No   Sexual activity: Yes    Partners: Female  Other Topics Concern   Not on file  Social History Narrative   Daily caffeine       [name]   [name]   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 No pets currently. No hot tub or bird exposure.    Married wife Social research officer, government   Social Determinants of Radio broadcast assistant Strain: Not on file  Food Insecurity: Not on file  Transportation Needs: Not on file  Physical Activity: Not on file  Stress: Not on file  Social Connections: Not on file   Family History  Problem Relation Age of Onset   Bone cancer Mother    Diabetes Mother    Colon cancer Neg Hx    Esophageal cancer Neg Hx    Rectal cancer Neg Hx    Stomach cancer Neg Hx    Lung disease Neg Hx    Allergies  Allergen Reactions   Heparin     Blood clots    Penicillin G Shortness Of Breath   Levaquin [Levofloxacin In D5w] Other (See Comments)    Patient felt jittery and nervous with insomnia on the medication.   Aspirin     Breathing complications, can tolerate baby asa   Codeine     Some is ok   Sucralfate     Could not sleep   Zetia [Ezetimibe]     jittery   Zocor [Simvastatin]     Liver enzymes increased   Plavix [Clopidogrel Bisulfate] Palpitations   Prior to Admission medications   Medication Sig Start Date End Date Taking? Authorizing Provider  acetaminophen (TYLENOL) 325 MG tablet Take 325 mg by mouth at bedtime.    [provider]  albuterol (PROVENTIL HFA;VENTOLIN HFA) 108 (90 BASE) MCG/ACT inhaler Inhale 2 puffs into the lungs every 6 (six) hours as needed for wheezing. 04/13/13   Estill Dooms, MD  ALPRAZolam Duanne Moron) 0.5 MG tablet TAKE 1 TO 2 TABLETS BY MOUTH EVERY DAY AS NEEDED FOR ANXIETY 03/12/21   Wardell Honour, MD  aspirin 81 MG tablet Take 81 mg by mouth 3  (three) times a week. Mondays, wednesdays and fridays    [provider]  COVID-19 mRNA Vac-TriS, Pfizer, (PFIZER-BIONT COVID-19 VAC-TRIS) SUSP injection Inject into  the muscle. 06/19/21   Carlyle Basques, MD  diazepam (VALIUM) 5 MG tablet TAKE 1 TO 2 TABLETS BY MOUTH AT BEDTIME AS NEEDED FOR REST 03/12/21   Wardell Honour, MD  Fluticasone Furoate [facility] SENSIMIST NA) Place 1 spray into both nostrils daily.    [provider]  hydrocortisone (ANUSOL-HC) 2.5 % rectal cream Place 1 application rectally 2 (two) times daily. Use pea sized amount rectally twice daily for 7 days 03/06/21   Mauri Pole, MD  metoprolol succinate (TOPROL XL) 25 MG 24 hr tablet Take 1 tablet (25 mg total) by mouth daily. 07/15/21   Belva Crome, MD  montelukast (SINGULAIR) 10 MG tablet Take 1 tablet (10 mg total) by mouth at bedtime. 07/10/21   Ngetich, Dinah C, NP  nystatin (MYCOSTATIN) 100000 UNIT/ML suspension Take 5 mLs (500,000 Units total) by mouth 2 (two) times daily. 07/05/21   Ngetich, Dinah C, NP  rivaroxaban (XARELTO) 20 MG TABS tablet Take 1 tablet (20 mg total) by mouth daily with supper. 07/12/21   Blanchie Dessert, MD  rosuvastatin (CRESTOR) 10 MG tablet Take 1 tablet (10 mg total) by mouth daily. 01/03/15   Belva Crome, MD     Positive ROS: Otherwise negative  All other systems have been reviewed and were otherwise negative with the exception of those mentioned in the HPI and as above.  Physical Exam: Constitutional: Alert, well-appearing, no acute distress Ears: External ears without lesions or tenderness. Ear canals are clear bilaterally with intact, clear TMs.  Both middle ear spaces are clear. Nasal: External nose without lesions. Septum with mild deformity and clear nasal passages otherwise with clear middle meatus bilaterally.. Oral: Lips and gums without lesions. Tongue and palate mucosa without lesions. Posterior oropharynx clear.  Tongue is normal to evaluation with no  exudate and no tongue abnormality noted.  All mucosal membranes were clear. Neck: No palpable adenopathy or masses.  No palpable adenopathy on either side of the neck or in the parotid area. Respiratory: Breathing comfortably  Skin: No facial/neck lesions or rash noted.  Procedures  Assessment: Altered taste questionable etiology  Plan: Discussed with patient concerning good oral hygiene which he is already doing.  He has tried using antiseptic mouth rinses including Listerine. I discussed with him that there is no specific medications to help improve this and that hopefully this will gradually improve with time as it has had some improvement.   Radene Journey, MD

## 2021-08-15 ENCOUNTER — Ambulatory Visit (INDEPENDENT_AMBULATORY_CARE_PROVIDER_SITE_OTHER): Payer: Medicare Other

## 2021-08-15 DIAGNOSIS — I4891 Unspecified atrial fibrillation: Secondary | ICD-10-CM | POA: Diagnosis not present

## 2021-08-16 ENCOUNTER — Telehealth: Payer: Self-pay | Admitting: Interventional Cardiology

## 2021-08-16 NOTE — Telephone Encounter (Signed)
Patient called to say that he activate his heart monitor on yesterday (9/8). And also he would like the nurse to give him a call. He has some question. Please advise

## 2021-08-16 NOTE — Telephone Encounter (Signed)
Pt wanted to let me know that he received his monitor but didn't start it until about 6pm yesterday.  He also wanted to let me know that he hasn't started the Metoprolol or the Xarelto yet because he was concerned it might throw him into AF while he is wearing the monitor. Advised pt that the medication shouldn't cause AF but AF is what we are looking for, so he needs to start the medication so we can see if anything changes while he is on it, especially given his medication sensitivity.  Pt agreeable to start the medications today.  He will call back if he has any issues after starting the meds.

## 2021-08-19 ENCOUNTER — Telehealth: Payer: Self-pay | Admitting: Interventional Cardiology

## 2021-08-19 NOTE — Telephone Encounter (Signed)
Pt calling to update on how he is doing since we spoke last week.  Pt only took Xarelto x 1 day because he developed chest heaviness, insomnia, anxiety, and breathing trouble when he took it.  Had the same symptoms the last time he tried to take it.  Took Metoprolol Succinate '25mg'$  once daily on Thursday, Friday, Saturday and Sunday.  Developed extreme fatigue and decided not to take it today.  States he felt like a slug but it did help as far as making him feel calmer and less palps.  Pt previously tried Eliquis as well and states that's the worst his breathing has ever been.  Currently back on ASA '81mg'$  QOD.  Oxygen 95-97% and HR 52-61 (gets up to 81 right after exercise).  Currently wearing a monitor and has pressed the button a few times but no tracings have been received.  Does not have a BP monitor.  Pt agreeable to trying just a half tablet of Metoprolol to begin with.  Advised I will send message to Dr. Tamala Julian for review.  Pt still thinking on Dr. Mardene Speak recommendation of starting Dofetilide.

## 2021-08-19 NOTE — Telephone Encounter (Signed)
Pt c/o medication issue:  1. Name of Medication:  rivaroxaban (XARELTO) 20 MG TABS tablet metoprolol succinate (TOPROL XL) 25 MG 24 hr tablet  2. How are you currently taking this medication (dosage and times per day)?   rivaroxaban (XARELTO) 20 MG TABS tablet Take 1 tablet (20 mg total) by mouth daily with supper.   metoprolol succinate (TOPROL XL) 25 MG 24 hr tablet Take 1 tablet (25 mg total) by mouth daily.  3. Are you having a reaction (difficulty breathing--STAT)? yes  4. What is your medication issue? Pt says from the metoprolol he is experiencing extreme fatigue and the Xarelto is giving him breathing problems and heaviness in his chest

## 2021-08-20 ENCOUNTER — Telehealth: Payer: Self-pay

## 2021-08-20 MED ORDER — WARFARIN SODIUM 5 MG PO TABS: 5.0000 mg | ORAL_TABLET | Freq: Every day | ORAL | 11 refills | Status: DC

## 2021-08-20 NOTE — Telephone Encounter (Signed)
Pt had tracing come in from monitor company in regards to AF with RVR.  See phone note.

## 2021-08-20 NOTE — Telephone Encounter (Signed)
   Cardiac Monitor Alert  Date of alert:  08/20/2021   Patient Name: Alexander [name]  DOB: 11/30/1936  MRN: [id number]   [facility] Cardiologist: Sinclair Grooms, MD  [facility] EP:  Vickie Epley, MD    Monitor Information: Cardiac Event Monitor [[employer]]  Reason:  Atrial fibrillation Ordering provider:  Daneen Schick, MD   Alert Atrial Fibrillation/Flutter This is the 1st alert for this rhythm.  The patient has a hx of Atrial Fibrillation/Flutter.  The patient is not currently on anticoagulation.-- Aspirin 81 mg PO 3 times per week  Next Cardiology Appointment   Date: 08/23/21 Provider:  C[facility]in [facility]  The patient was contacted today.  He is symptomatic.  He reports the following symptoms:  feels like his heart beat is all over the place and it's harder to take a deep breath. Arrhythmia, symptoms and history reviewed with Tamala Julian, MD.  Plan:  Start warfarin 5 mg PO QD and new patient appointment with coumadin clinic on 08/23/21.  Pt reports that he is aware to limit the amount of Vitamin K he consumes.  He is also aware that he will have frequent OV with coumadin clinic.  Pt will stop ASA 81 mg 3 times per week.       Precious Gilding, RN  08/20/2021 3:28 PM

## 2021-08-20 NOTE — Telephone Encounter (Signed)
   Pt said he is returning Jennifer's call,he said, 30 minutes ago after doing exercises he got into afib, it was subsiding while on phone but he said, he can still feel it

## 2021-08-21 ENCOUNTER — Telehealth: Payer: Self-pay | Admitting: Interventional Cardiology

## 2021-08-21 ENCOUNTER — Encounter: Payer: Self-pay | Admitting: Pharmacist

## 2021-08-21 MED ORDER — ASPIRIN EC 81 MG PO TBEC: 81.0000 mg | DELAYED_RELEASE_TABLET | ORAL | 3 refills | Status: AC

## 2021-08-21 NOTE — Telephone Encounter (Signed)
Spoke with pt and he states that he took his Warfarin last night at 7pm and a few hours later became bloated and uncomfortable.  This morning he had developed diarrhea.  Spoke with Dr. Tamala Julian and he wanted pt to try to keep taking the Warfarin.  Pt adamant that he does not want to take anymore Warfarin due to the side effects.  States Warfarin hyped him up and made him feel like he "took speed".  Pt plans to go back to taking ASA '81mg'$  MWF.  States he may try taking Warfarin half tablet to see if he tolerates that first.  Advised if he does this, please let us know so we can get him in with Coumadin Clinic.  Pt said he would call if he decides to challenge Coumadin again.

## 2021-08-21 NOTE — Telephone Encounter (Signed)
Abdominal pain and diarrhea can happen with warfarin therapy.  Insomnia is not listed as a side effect however

## 2021-08-21 NOTE — Telephone Encounter (Signed)
This encounter was created in error - please disregard.

## 2021-08-21 NOTE — Telephone Encounter (Signed)
Pt c/o medication issue:  1. Name of Medication: Coumadin   2. How are you currently taking this medication (dosage and times per day)? Take 1 tablet (5 mg total) by mouth daily.  3. Are you having a reaction (difficulty breathing--STAT)? Swollen stomach, diarrhea and insomnia   4. What is your medication issue? adverse reactions from coumadin   Pt is requesting to speak w/ Anderson Malta

## 2021-08-21 NOTE — Telephone Encounter (Signed)
Have you ever had this happen with other patients?

## 2021-08-26 ENCOUNTER — Other Ambulatory Visit: Payer: Self-pay | Admitting: *Deleted

## 2021-08-26 DIAGNOSIS — F411 Generalized anxiety disorder: Secondary | ICD-10-CM

## 2021-08-26 MED ORDER — ALPRAZOLAM 0.5 MG PO TABS: ORAL_TABLET | ORAL | 0 refills | Status: DC

## 2021-08-26 NOTE — Telephone Encounter (Signed)
Pharmacy requested refill.  Epic LR: 03/12/2021 Contract on File.  Pended Rx and sent to [facility] for approval due to Dr. Sabra Heck out of office.

## 2021-08-29 ENCOUNTER — Other Ambulatory Visit: Payer: Self-pay | Admitting: *Deleted

## 2021-08-29 DIAGNOSIS — G4700 Insomnia, unspecified: Secondary | ICD-10-CM

## 2021-08-29 MED ORDER — DIAZEPAM 5 MG PO TABS: ORAL_TABLET | ORAL | 1 refills | Status: DC

## 2021-08-29 NOTE — Telephone Encounter (Signed)
Patient requested refill.  Epic LR: 03/12/2021 Contract on [facility] and sent to [facility] for approval (Dr. Sabra Heck out of office)

## 2021-09-09 ENCOUNTER — Encounter: Payer: Self-pay | Admitting: Interventional Cardiology

## 2021-09-09 NOTE — Telephone Encounter (Signed)
Error

## 2021-09-30 ENCOUNTER — Telehealth: Payer: Self-pay | Admitting: Interventional Cardiology

## 2021-09-30 NOTE — Telephone Encounter (Addendum)
  Pt called to report that he has been in Afib for 3 days which started after he had a stressful event and he has not been feeling well  since with SOB, dizziness, and he has been light headed all day today... he is not on anticoagulation. He says by his choice.. he says he cannot tolerate many meds.. his HR has been up and down and he says he can feel it beating irregular and fast.. he kept saying he does not want to have a stroke... I have urged him to have someone take him to the ED for assessment..I explained to him that they could place him on a monitor and treat him more immediate than we could if we made him an appt as he has requested but he can call us after the ED tomorrow and we can make a follow up appt.. he says he does not really want to go that he does not know what they can do for him and he is worried they will force him to use an anticoagulant which he does not want to take. I talked to him about the risks of no treatment but he says he will think about it but not sure if he will go. I advised that we open back up at 8 am if he needs to call back in the morning. I also advised him not to drive himself.    Addendum: pt says he took ASA 81 mg today and he took his metoprolol 25 mg but he cannot tolerate to take another.   Will send back to triage for possible follow up Tuesday 10/01/21.   Will also send to Dr. Quentin Ore for review re: if still possible to talk with the pt re: dofetilide... I failed to ask the pt if he would consider.

## 2021-09-30 NOTE — Telephone Encounter (Signed)
New Message:    Patient says he have been in Atrial Fib since Saturday morning.Patient c/o Palpitations:  High priority if patient c/o lightheadedness, shortness of breath, or chest pain  How long have you had palpitations/irregular HR/ Afib? Are you having the symptoms now? Pt is in  Afib now, been in there since Saturday morning    Are you currently experiencing lightheadedness, SOB or CP?   Do you have a history of afib (atrial fibrillation) or irregular heart rhythm? yes  Have you checked your BP or HR? (document readings if available):  can not check it  Are you experiencing any other symptoms? Lightheaded, dizziness, short of breath  Also having some side effects from his medicine.  Pt c/o medication issue:  1. Name of Medication: Metoprolol  2. How are you currently taking this medication (dosage and times per day)? 1 time a day  3. Are you having a reaction (difficulty breathing--STAT)?   4. What is your medication issue?  Weakness, dizziness, unable  to lsleep,slow down his his heart rate t

## 2021-10-01 ENCOUNTER — Telehealth: Payer: Self-pay

## 2021-10-01 NOTE — Telephone Encounter (Signed)
Returned call to Pt.  We discussed that his treatment options for his afib are contingent on him taking a blood thinner.  He would not be able to be admitted for dofetilide or consider ablation while off his blood thinner.  Per Pt he did not tolerate warfarin, Xarelto or Eliquis.  He is also having trouble with his metoprolol.  States it makes him feel "not like himself" and he cannot sleep.  He states he is stopping the metoprolol.  We discussed that his remaining options for a blood thinner are Pradaxa-and his PCP gave him the medication edoxaban as an alternative.  [facility] to try.  He will try this medication for a few weeks to see if he tolerates.  [place] to see how he is doing on Pradaxa and determine what other medication he may be able to take for his afib.  Pt in agreement.  Thanked nurse for call.

## 2021-10-01 NOTE — Telephone Encounter (Signed)
error 

## 2021-10-01 NOTE — Addendum Note (Signed)
Addended by: Willeen Cass A on: 10/01/2021 08:45 AM   Modules accepted: Orders

## 2021-10-02 ENCOUNTER — Encounter: Payer: Self-pay | Admitting: Family

## 2021-10-02 ENCOUNTER — Ambulatory Visit (INDEPENDENT_AMBULATORY_CARE_PROVIDER_SITE_OTHER): Payer: Medicare Other | Admitting: Family

## 2021-10-02 ENCOUNTER — Other Ambulatory Visit: Payer: Self-pay

## 2021-10-02 VITALS — BP 118/72 | HR 80 | Temp 97.7°F | Resp 16 | Ht 69.0 in | Wt 184.4 lb

## 2021-10-02 DIAGNOSIS — J4520 Mild intermittent asthma, uncomplicated: Secondary | ICD-10-CM | POA: Diagnosis not present

## 2021-10-02 DIAGNOSIS — R0602 Shortness of breath: Secondary | ICD-10-CM

## 2021-10-02 DIAGNOSIS — I480 Paroxysmal atrial fibrillation: Secondary | ICD-10-CM | POA: Diagnosis not present

## 2021-10-02 MED ORDER — APIXABAN 2.5 MG PO TABS: 2.5000 mg | ORAL_TABLET | Freq: Two times a day (BID) | ORAL | 0 refills | Status: DC

## 2021-10-02 NOTE — Progress Notes (Signed)
Provider: Kaaren Nass FNP-C  Wardell Honour, MD  Patient Care Team: Wardell Honour, MD as PCP - General (Family Medicine) Belva Crome, MD as PCP - Cardiology (Cardiology) Vickie Epley, MD as PCP - Electrophysiology (Cardiology) Javier Glazier, MD as Consulting Physician (Pulmonary Disease) Danella Sensing, MD as Consulting Physician (Dermatology) Jessy Oto, MD as Consulting Physician (Orthopedic Surgery) Irine Seal, MD as Attending Physician (Urology) Jackolyn Confer, MD as Consulting Physician (General Surgery) Melrose Nakayama, MD as Consulting Physician (Cardiothoracic Surgery) Rozetta Nunnery, MD as Consulting Physician (Otolaryngology) Griselda Miner, MD as Consulting Physician (Dermatology)  Extended Emergency Contact Information Primary Emergency Contact: Borah,[name] Address: [place], [place] [address] [place] Phone: [phone] Mobile Phone: [phone] Relation: Spouse  Code Status:  DNR Goals of care: Advanced Directive information Advanced Directives 10/02/2021  Does Patient Have a Medical Advance Directive? No  Type of Advance Directive -  Does patient want to make changes to medical advance directive? -  Copy of [place] in Chart? -  Would patient like information on creating a medical advance directive? No - Patient declined  Pre-existing out of facility DNR order (yellow form or pink MOST form) -     Chief Complaint  Patient presents with   Acute Visit    Patient complains of A-Fib    HPI:  Pt is a 85 y.o. male seen today for an acute visit for evaluation of AFib.states HR goes up and down up to 139 b/min.He has a significant medical history of Afib. States went to ED Xarelto  for few times but impaired his breathing.He could not tolerate Eliquis. He did not have Afib for 5 1/2 weeks then started on Saturday.  Had Warfarin from the  [place]  He was taking metoprolol 25 mg tablet but took 1/2 tablet (12.5 mg ) because it was making him dizziness. Also states could not sleep so he stopped taking metoprolol completely.    Past Medical History:  Diagnosis Date   Allergic rhinitis due to pollen    Anginal pain ([place])    once in a while, none recent   Anxiety    Anxiety disorder    Asthma    exercise induced   Atrial fibrillation (HCC)    hx of   Basal cell carcinoma of skin of lip    Clotting disorder (HCC)    Coronary atherosclerosis of native coronary artery    with CABG 3/12  -MAZE procedure    Diastasis recti 05/07/2016   Difficulty urinating    Diverticulosis of colon (without mention of hemorrhage)    mild   Dysuria    Edema    Essential hypertension, benign    Heart attack ([place]) 1660   Helicobacter pylori (H. 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   History of anal fissures    History of cholelithiasis    Hyperlipidemia    Hypertrophy of prostate with urinary obstruction and other lower urinary tract symptoms (LUTS)    Impotence of organic origin    Internal hemorrhoids without mention of complication    Jaundice age 89   Long term (current) use of anticoagulants    Neutropenia ([employer]) 10/07/2016   Nonspecific elevation of levels of transaminase or lactic acid dehydrogenase (LDH)    Other voice and resonance disorders    after surgery, took breathing tube  out and damaged something   Pneumonia    Pulmonary embolism ([place]) 2012   s/p heart surgery   Pulmonary embolism ([place])    3/12   Rheumatic fever age 31   Skin cancer    Hx of squamous cell x2   Sleep apnea    no CPAP   Unspecified constipation    Past Surgical History:  Procedure Laterality Date   CARDIAC CATHETERIZATION     CHOLECYSTECTOMY N/A 11/18/2013   Procedure: LAPAROSCOPIC CHOLECYSTECTOMY With IOC;  Surgeon: Odis Hollingshead, MD;  Location: [facility];  Service: General;  Laterality: N/A;   CORONARY ARTERY BYPASS GRAFT  2012    Median sternotomy, extracorporeal circulation, coronary   HERNIA REPAIR  2010   bilateral   HYDROCELE EXCISION     dr [name]    [facility] SURGERY     squamus x2; [facility] fo bridge of nose June 2017.   ROOT CANAL  12/2020   Pr patient he had complications    SCROTUM EXPLORATION  1990's   multiple   SPERMATOCELECTOMY  02/1999   Right, Dr.[name]   SUPERFICIAL SKIN CYSTECTOMY (l) HIP Right 1997   dr [name]   VASECTOMY  04/1992    Allergies  Allergen Reactions   Heparin     Blood clots    Penicillin G Shortness Of Breath   Levaquin [Levofloxacin In D5w] Other (See Comments)    Patient felt jittery and nervous with insomnia on the medication.   Aspirin     Breathing complications, can tolerate baby asa   Codeine     Some is ok   Sucralfate     Could not sleep   Zetia [Ezetimibe]     jittery   Zocor [Simvastatin]     Liver enzymes increased   Plavix [Clopidogrel Bisulfate] Palpitations    Outpatient Encounter Medications as of 10/02/2021  Medication Sig   acetaminophen (TYLENOL) 325 MG tablet Take 325 mg by mouth at bedtime.   albuterol (PROVENTIL HFA;VENTOLIN HFA) 108 (90 BASE) MCG/ACT inhaler Inhale 2 puffs into the lungs every 6 (six) hours as needed for wheezing.   ALPRAZolam (XANAX) 0.5 MG tablet TAKE 1 TO 2 TABLETS BY MOUTH EVERY DAY AS NEEDED FOR ANXIETY   aspirin EC 81 MG tablet Take 1 tablet (81 mg total) by mouth every Monday, Wednesday, and Friday. Swallow whole.   COVID-19 mRNA Vac-TriS, Pfizer, (PFIZER-BIONT COVID-19 VAC-TRIS) SUSP injection Inject into the muscle.   diazepam (VALIUM) 5 MG tablet TAKE 1 TO 2 TABLETS BY MOUTH AT BEDTIME AS NEEDED FOR REST   Fluticasone Furoate (FLONASE SENSIMIST NA) Place 1 spray into both nostrils daily.   hydrocortisone (ANUSOL-HC) 2.5 % rectal cream Place 1 application rectally 2 (two) times daily. Use pea sized amount rectally twice daily for 7 days   montelukast (SINGULAIR) 10 MG tablet Take 1 tablet (10 mg total) by mouth at bedtime.    nystatin (MYCOSTATIN) 100000 UNIT/ML suspension Take 5 mLs (500,000 Units total) by mouth 2 (two) times daily.   rosuvastatin (CRESTOR) 10 MG tablet Take 1 tablet (10 mg total) by mouth daily.   No facility-administered encounter medications on file as of 10/02/2021.    Review of Systems  Constitutional:  Negative for appetite change, chills, fatigue, fever and unexpected weight change.  HENT:  Negative for congestion, dental problem, ear discharge, ear pain, facial swelling, hearing loss, nosebleeds, postnasal drip, rhinorrhea, sinus pressure, sinus pain, sneezing, sore throat, tinnitus and trouble swallowing.   Eyes:  Negative for  pain, discharge, redness, itching and visual disturbance.  Respiratory:  Negative for cough, chest tightness, shortness of breath and wheezing.   Cardiovascular:  Negative for chest pain and leg swelling.       Irregular heart beat   Gastrointestinal:  Negative for abdominal distention, abdominal pain, blood in stool, constipation, diarrhea, nausea and vomiting.  Musculoskeletal:  Negative for arthralgias, back pain, gait problem, joint swelling, myalgias, neck pain and neck stiffness.  Skin:  Negative for color change, pallor, rash and wound.  Neurological:  Negative for dizziness, syncope, speech difficulty, weakness, light-headedness, numbness and headaches.  Hematological:  Does not bruise/bleed easily.  Psychiatric/Behavioral:  Negative for agitation, behavioral problems, confusion, self-injury, sleep disturbance and suicidal ideas. The patient is not nervous/anxious.    Immunization History  Administered Date(s) Administered   H1N1 10/08/2008   Influenza Split 10/13/2013, 10/02/2014   Influenza, High Dose Seasonal PF 09/26/2015   Influenza,inj,Quad PF,6+ Mos 10/07/2016, 08/30/2018, 09/06/2019, 10/22/2020   Influenza-Unspecified 10/16/2006, 08/08/2008, 10/08/2009, 11/15/2012, 10/02/2014, 09/08/2015, 10/02/2016, 09/07/2018, 10/15/2020   PFIZER  Comirnaty(Gray Top)Covid-19 Tri-Sucrose Vaccine 06/19/2021   PFIZER(Purple Top)SARS-COV-2 Vaccination 01/03/2020, 01/14/2020, 02/04/2020, 09/27/2020   Pneumococcal Conjugate-13 10/16/2010, 10/02/2014   Pneumococcal Polysaccharide-23 03/20/2006   Td 12/15/2007   Tdap 04/07/2008, 10/01/2019   Tetanus 12/09/1995   Pertinent  Health Maintenance Due  Topic Date Due   INFLUENZA VACCINE  07/08/2021   Fall Risk 02/12/2021 07/10/2021 07/12/2021 08/07/2021 10/02/2021  Falls in the past year? 0 0 - 0 0  Number of falls in past year - - - - -  Was there an injury with Fall? 0 0 - 0 0  Fall Risk Category Calculator 0 0 - 0 0  Fall Risk Category Low Low - Low Low  Patient Fall Risk Level - Low fall risk Low fall risk Low fall risk Low fall risk  Patient at Risk for Falls Due to - No Fall Risks - No Fall Risks No Fall Risks  Fall risk Follow up - Falls evaluation completed - Falls evaluation completed;Education provided;Falls prevention discussed Falls evaluation completed   Functional Status Survey:    Vitals:   10/02/21 1500  BP: 110/70  Pulse: 80  Resp: 16  Temp: 97.7 F (36.5 C)  SpO2: 98%  Weight: 184 lb 6.4 oz (83.6 kg)  Height: 5\' 9"  (1.753 m)   Body mass index is 27.23 kg/m. Physical Exam Vitals reviewed.  Constitutional:      General: He is not in acute distress.    Appearance: Normal appearance. He is overweight. He is not ill-appearing or diaphoretic.  HENT:     Head: Normocephalic.     Mouth/Throat:     Mouth: Mucous membranes are moist.     Pharynx: Oropharynx is clear. No oropharyngeal exudate or posterior oropharyngeal erythema.  Eyes:     General: No scleral icterus.       Right eye: No discharge.        Left eye: No discharge.     Conjunctiva/sclera: Conjunctivae normal.     Pupils: Pupils are equal, round, and reactive to light.  Neck:     Vascular: No carotid bruit.  Cardiovascular:     Rate and Rhythm: Normal rate and regular rhythm.     Pulses: Normal pulses.      Heart sounds: Normal heart sounds. No murmur heard.   No friction rub. No gallop.  Pulmonary:     Effort: Pulmonary effort is normal. No respiratory distress.     Breath sounds: Normal  breath sounds. No wheezing, rhonchi or rales.  Chest:     Chest wall: No tenderness.  Abdominal:     General: Bowel sounds are normal. There is no distension.     Palpations: Abdomen is soft. There is no mass.     Tenderness: There is no abdominal tenderness. There is no right CVA tenderness, left CVA tenderness, guarding or rebound.  Musculoskeletal:        General: No swelling or tenderness. Normal range of motion.     Cervical back: Normal range of motion. No rigidity or tenderness.     Right lower leg: No edema.     Left lower leg: No edema.  Lymphadenopathy:     Cervical: No cervical adenopathy.  Skin:    General: Skin is warm and dry.     Coloration: Skin is not pale.     Findings: No bruising, erythema, lesion or rash.  Neurological:     Mental Status: He is alert and oriented to person, place, and time.     Cranial Nerves: No cranial nerve deficit.     Sensory: No sensory deficit.     Motor: No weakness.     Coordination: Coordination normal.     Gait: Gait normal.  Psychiatric:        Mood and Affect: Mood normal.        Speech: Speech normal.        Behavior: Behavior normal.        Thought Content: Thought content normal.        Judgment: Judgment normal.    Labs reviewed: Recent Labs    01/07/21 0831 07/12/21 1329  NA 141 138  K 4.9 4.8  CL 104 100  CO2 33* 31  GLUCOSE 101* 147*  BUN 11 17  CREATININE 1.04 1.12  CALCIUM 9.3 9.2   Recent Labs    01/07/21 0831  AST 25  ALT 25  BILITOT 1.3*  PROT 6.6   Recent Labs    01/07/21 0831 07/12/21 1329  WBC 6.4 8.1  NEUTROABS 3,482  --   HGB 16.3 16.3  HCT 46.7 47.5  MCV 94.0 93.3  PLT 230 211   Lab Results  Component Value Date   TSH 3.340 10/17/2014   Lab Results  Component Value Date   HGBA1C 5.9 (H)  01/28/2021   Lab Results  Component Value Date   CHOL 163 01/07/2021   HDL 55 01/07/2021   LDLCALC 91 01/07/2021   TRIG 80 01/07/2021   CHOLHDL 3.0 01/07/2021    Significant Diagnostic Results in last 30 days:  No results found.  Assessment/Plan 1. Paroxysmal atrial fibrillation (HCC)  Has stopped metoprolol.States had coumadin and Xarelto that he stopped taking in the past for fear of side effects.Has used Eliquis in the past but stopped too. - D/c Asprin  Will restart Eliquis as below.Agrees to take low dose - EKG 12-Lead indicates Afib with HR 87 b/min  - apixaban (ELIQUIS) 2.5 MG TABS tablet; Take 1 tablet (2.5 mg total) by mouth 2 (two) times daily.  Dispense: 60 tablet; Refill: 0 - Follow up with Cardiologist as soon as possible    2. Mild intermittent extrinsic asthma without complication Request evaluation by pulmonology had breathing issues with metoprolol.Has stopped metoprolol. seems like symptoms are recurring suspect possible change in weather but declines. Continue on Albuterol Refer to get PFT's  - Ambulatory referral to Pulmonology  3. Shortness of breath Unclear etiology  Continue on Albuterol  -  Ambulatory referral to Pulmonology  Family/ staff Communication: Reviewed plan of care with patient verbalized understanding   Labs/tests ordered: None   Next Appointment: As needed if symptoms worsen or fail to improve    Sandrea Hughs, NP

## 2021-10-02 NOTE — Patient Instructions (Addendum)
-   Stop Asprin   - Restart Eliquis 2.5 mg tablet one by mouth twice daily   - Follow up with Cardiologist as soon as possible

## 2021-10-11 ENCOUNTER — Other Ambulatory Visit: Payer: Self-pay | Admitting: *Deleted

## 2021-10-11 DIAGNOSIS — I480 Paroxysmal atrial fibrillation: Secondary | ICD-10-CM

## 2021-10-11 DIAGNOSIS — J4520 Mild intermittent asthma, uncomplicated: Secondary | ICD-10-CM

## 2021-10-11 MED ORDER — MONTELUKAST SODIUM 10 MG PO TABS: 10.0000 mg | ORAL_TABLET | Freq: Every day | ORAL | 1 refills | Status: DC

## 2021-10-11 MED ORDER — APIXABAN 2.5 MG PO TABS: 2.5000 mg | ORAL_TABLET | Freq: Two times a day (BID) | ORAL | 1 refills | Status: DC

## 2021-10-11 NOTE — Telephone Encounter (Signed)
[facility] requested refill.  Pended Rx and sent to [employer] for approval due to [place]. Dr. Sabra Heck out of office.

## 2021-10-16 ENCOUNTER — Telehealth: Payer: Self-pay | Admitting: *Deleted

## 2021-10-16 NOTE — Telephone Encounter (Signed)
Patient called and wanted to know if we received anything from [facility].  I told patient that we did on 11/4, they faxed [place] for a refill on his Montelukast and Eliquis.   Patient stated that he had received a call from [facility] in [place] awhile back and they wanted information on his insurance and he didn't give it to them. He wonders if they just added him to their database anyway without his permission.   Patient stated to NOT fill anything to [facility] unless he tells [place] to. Stated that they are listed on his Insurance card but he did not tell them he wanted to use their service. Stated he uses [facility].

## 2021-10-23 DIAGNOSIS — L821 Other seborrheic keratosis: Secondary | ICD-10-CM | POA: Diagnosis not present

## 2021-10-23 DIAGNOSIS — L720 Epidermal cyst: Secondary | ICD-10-CM | POA: Diagnosis not present

## 2021-10-23 DIAGNOSIS — Z85828 Personal history of other malignant neoplasm of skin: Secondary | ICD-10-CM | POA: Diagnosis not present

## 2021-10-23 DIAGNOSIS — L570 Actinic keratosis: Secondary | ICD-10-CM | POA: Diagnosis not present

## 2021-10-23 DIAGNOSIS — D485 Neoplasm of uncertain behavior of skin: Secondary | ICD-10-CM | POA: Diagnosis not present

## 2021-11-18 NOTE — Telephone Encounter (Signed)
Pt saw PCP and started Eliquis 2.5 mg PO BID and was advised to follow up with cardiology.  Recall has been sent to Pt.  Will forward to EP scheduling to schedule follow up per recall.

## 2021-12-03 NOTE — Progress Notes (Deleted)
PCP:  Wardell Honour, MD Primary Cardiologist: Sinclair Grooms, MD Electrophysiologist: Vickie Epley, MD   Alexander [name] is a 85 y.o. male seen today for Vickie Epley, MD for {Blank single:19197::"cardiac clearance","post hospital follow up","acute visit due to ***","routine electrophysiology followup"}.  Since {Blank single:19197::"last being seen in our clinic","discharge from hospital"} the patient reports doing ***.  he denies chest pain, palpitations, dyspnea, PND, orthopnea, nausea, vomiting, dizziness, syncope, edema, weight gain, or early satiety.  Past Medical History:  Diagnosis Date   Allergic rhinitis due to pollen    Anginal pain ([place])    once in a while, none recent   Anxiety    Anxiety disorder    Asthma    exercise induced   Atrial fibrillation (HCC)    hx of   Basal cell carcinoma of skin of lip    Clotting disorder (HCC)    Coronary atherosclerosis of native coronary artery    with CABG 3/12  -MAZE procedure    Diastasis recti 05/07/2016   Difficulty urinating    Diverticulosis of colon (without mention of hemorrhage)    mild   Dysuria    Edema    Essential hypertension, benign    Heart attack ([place]) 1914   Helicobacter pylori (H. 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   History of anal fissures    History of cholelithiasis    Hyperlipidemia    Hypertrophy of prostate with urinary obstruction and other lower urinary tract symptoms (LUTS)    Impotence of organic origin    Internal hemorrhoids without mention of complication    Jaundice age 26   Long term (current) use of anticoagulants    Neutropenia ([place]) 10/07/2016   Nonspecific elevation of levels of transaminase or lactic acid dehydrogenase (LDH)    Other voice and resonance disorders    after surgery, took breathing tube out and damaged something   Pneumonia    Pulmonary embolism ([place]) 2012   s/p heart surgery   Pulmonary embolism ([place])    3/12   Rheumatic fever  age 58   Skin cancer    Hx of squamous cell x2   Sleep apnea    no CPAP   Unspecified constipation    Past Surgical History:  Procedure Laterality Date   CARDIAC CATHETERIZATION     CHOLECYSTECTOMY N/A 11/18/2013   Procedure: LAPAROSCOPIC CHOLECYSTECTOMY With [place];  Surgeon: Odis Hollingshead, MD;  Location: [facility];  Service: General;  Laterality: N/A;   CORONARY ARTERY BYPASS GRAFT  2012   Median sternotomy, extracorporeal circulation, coronary   HERNIA REPAIR  2010   bilateral   HYDROCELE EXCISION     dr [name]    [facility] SURGERY     squamus x2; [facility] fo bridge of nose June 2017.   ROOT CANAL  12/2020   Pr patient he had complications    SCROTUM EXPLORATION  1990's   multiple   SPERMATOCELECTOMY  02/1999   Right, Dr.[name]   SUPERFICIAL SKIN CYSTECTOMY (l) HIP Right 1997   dr [name]   VASECTOMY  04/1992    Current Outpatient Medications  Medication Sig Dispense Refill   acetaminophen (TYLENOL) 325 MG tablet Take 325 mg by mouth at bedtime.     albuterol (PROVENTIL HFA;VENTOLIN HFA) 108 (90 BASE) MCG/ACT inhaler Inhale 2 puffs into the lungs every 6 (six) hours as needed for wheezing. 1 Inhaler 0   ALPRAZolam (XANAX) 0.5 MG tablet TAKE 1  TO 2 TABLETS BY MOUTH EVERY DAY AS NEEDED FOR ANXIETY 30 tablet 0   apixaban (ELIQUIS) 2.5 MG TABS tablet Take 1 tablet (2.5 mg total) by mouth 2 (two) times daily. 180 tablet 1   aspirin EC 81 MG tablet Take 1 tablet (81 mg total) by mouth every Monday, Wednesday, and Friday. Swallow whole. 45 tablet 3   COVID-19 mRNA Vac-TriS, Pfizer, (PFIZER-BIONT COVID-19 VAC-TRIS) SUSP injection Inject into the muscle. 0.3 mL 0   diazepam (VALIUM) 5 MG tablet TAKE 1 TO 2 TABLETS BY MOUTH AT BEDTIME AS NEEDED FOR REST 60 tablet 1   Fluticasone Furoate (FLONASE SENSIMIST NA) Place 1 spray into both nostrils daily.     hydrocortisone (ANUSOL-HC) 2.5 % rectal cream Place 1 application rectally 2 (two) times daily. Use pea sized amount rectally twice daily for 7 days  30 g 1   montelukast (SINGULAIR) 10 MG tablet Take 1 tablet (10 mg total) by mouth at bedtime. 90 tablet 1   nystatin (MYCOSTATIN) 100000 UNIT/ML suspension Take 5 mLs (500,000 Units total) by mouth 2 (two) times daily. 60 mL 1   rosuvastatin (CRESTOR) 10 MG tablet Take 1 tablet (10 mg total) by mouth daily. 30 tablet 11   No current facility-administered medications for this visit.    Allergies  Allergen Reactions   Heparin     Blood clots    Penicillin G Shortness Of Breath   Levaquin [Levofloxacin In D5w] Other (See Comments)    Patient felt jittery and nervous with insomnia on the medication.   Aspirin     Breathing complications, can tolerate baby asa   Codeine     Some is ok   Sucralfate     Could not sleep   Zetia [Ezetimibe]     jittery   Zocor [Simvastatin]     Liver enzymes increased   Plavix [Clopidogrel Bisulfate] Palpitations    Social History   Socioeconomic History   Marital status: Married    Spouse name: Not on file   Number of children: 1   Years of education: Not on file   Highest education level: Not on file  Occupational History   Occupation: Presenter, broadcasting: [employer]  Tobacco Use   Smoking status: Former    Packs/day: 1.00    Years: 22.00    Pack years: 22.00    Types: Cigarettes    Start date: 08/22/1956    Quit date: 12/08/1977    Years since quitting: 44.0   Smokeless tobacco: Never   Tobacco comments:    significant second-hand smoke exposure at work  Scientific laboratory technician Use: Never used  Substance and Sexual Activity   Alcohol use: Yes    Alcohol/week: 0.0 standard drinks    Comment: former heavy alcohol use, rare use   Drug use: No   Sexual activity: Yes    Partners: Female  Other Topics Concern   Not on file  Social History Narrative   Daily caffeine       [name]   [name]   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 No pets currently. No hot tub or  bird exposure.    Married wife Social research officer, government   Social Determinants of Radio broadcast assistant Strain: Not on file  Food Insecurity: Not on file  Transportation Needs: Not on file  Physical Activity: Not on file  Stress: Not on file  Social Connections: Not on file  Intimate Partner Violence: Not on file     Review of Systems: All other systems reviewed and are otherwise negative except as noted above.  Physical Exam: There were no vitals filed for this visit.  GEN- The patient is well appearing, alert and oriented x 3 today.   HEENT: normocephalic, atraumatic; sclera clear, conjunctiva pink; hearing intact; oropharynx clear; neck supple, no JVP Lymph- no cervical lymphadenopathy Lungs- Clear to ausculation bilaterally, normal work of breathing.  No wheezes, rales, rhonchi Heart- Regular rate and rhythm, no murmurs, rubs or gallops, PMI not laterally displaced GI- soft, non-tender, non-distended, bowel sounds present, no hepatosplenomegaly Extremities- no clubbing, cyanosis, or edema; DP/PT/radial pulses 2+ bilaterally MS- no significant deformity or atrophy Skin- warm and dry, no rash or lesion Psych- euthymic mood, full affect Neuro- strength and sensation are intact  EKG is ordered. Personal review of EKG from today shows ***  Additional studies reviewed include: Previous EP office notes.   Assessment and Plan:  1. Persistent atrial fibrillation (HCC) Symptomatic.  EKG today shows *** He was started on eliquis 2.5 mg BID by his PCP, unfortunately, this is not the appropriate dose given his weight and renal  function.   Patient with history of Maze procedure in 2012 at the time of his bypass surgery.  Would like to try tikosyn but this is contingent on him being and remaining on [place].  Given his history of heparin-induced thrombocytopenia, ablation is not an ideal upfront choice. Ablation would have to be performed with bivalirudin for intraprocedural anticoagulation given his history of HIT.    2. HTN Stable on current regimen    3. Coronary artery disease involving coronary bypass graft of native heart without angina pectoris Denies s/s symptoms   4. Obstructive sleep apnea CPAP encouraged   5. History of heparin-induced thrombocytopenia Would necessitate the use of bivalirudin during ablation procedure.    Follow up with {Blank single:19197::"Dr. Allred","Dr. Arlan Organ. Klein","Dr. Camnitz","Dr. Lambert","EP APP"} in {Blank single:19197::"2 weeks","4 weeks","3 months","6 months","12 months","as usual post gen change"}   Shirley Friar, [place]  12/03/21 10:18 AM

## 2021-12-10 ENCOUNTER — Ambulatory Visit: Payer: Medicare Other | Admitting: Student

## 2021-12-31 ENCOUNTER — Other Ambulatory Visit: Payer: Self-pay | Admitting: *Deleted

## 2021-12-31 DIAGNOSIS — G4700 Insomnia, unspecified: Secondary | ICD-10-CM

## 2021-12-31 DIAGNOSIS — F411 Generalized anxiety disorder: Secondary | ICD-10-CM

## 2021-12-31 MED ORDER — DIAZEPAM 5 MG PO TABS: ORAL_TABLET | ORAL | 3 refills | Status: DC

## 2021-12-31 MED ORDER — ALPRAZOLAM 0.5 MG PO TABS: ORAL_TABLET | ORAL | 3 refills | Status: DC

## 2021-12-31 NOTE — Telephone Encounter (Signed)
Patient requested refill.  Epic LR: Diazepam 08/29/21 Alprazolam 08/26/2021 Contract Date: 01/31/2021. Added note to upcoming appointment to update.   Pended Rx's and sent to Dr. Sabra Heck for approval.

## 2022-02-04 ENCOUNTER — Encounter: Payer: Self-pay | Admitting: Family Medicine

## 2022-02-04 ENCOUNTER — Other Ambulatory Visit: Payer: Self-pay

## 2022-02-04 ENCOUNTER — Ambulatory Visit (INDEPENDENT_AMBULATORY_CARE_PROVIDER_SITE_OTHER): Payer: Medicare Other | Admitting: Family Medicine

## 2022-02-04 VITALS — BP 126/72 | Temp 98.4°F | Ht 69.0 in | Wt 183.4 lb

## 2022-02-04 DIAGNOSIS — I25810 Atherosclerosis of coronary artery bypass graft(s) without angina pectoris: Secondary | ICD-10-CM

## 2022-02-04 DIAGNOSIS — I480 Paroxysmal atrial fibrillation: Secondary | ICD-10-CM

## 2022-02-04 DIAGNOSIS — F411 Generalized anxiety disorder: Secondary | ICD-10-CM

## 2022-02-04 DIAGNOSIS — I10 Essential (primary) hypertension: Secondary | ICD-10-CM | POA: Diagnosis not present

## 2022-02-04 DIAGNOSIS — E7849 Other hyperlipidemia: Secondary | ICD-10-CM | POA: Diagnosis not present

## 2022-02-04 NOTE — Progress Notes (Signed)
Provider:  Alain Honey, MD  Careteam: Patient Care Team: Wardell Honour, MD as PCP - General (Family Medicine) Belva Crome, MD as PCP - Cardiology (Cardiology) Vickie Epley, MD as PCP - Electrophysiology (Cardiology) Javier Glazier, MD as Consulting Physician (Pulmonary Disease) Danella Sensing, MD as Consulting Physician (Dermatology) Jessy Oto, MD as Consulting Physician (Orthopedic Surgery) Irine Seal, MD as Attending Physician (Urology) Jackolyn Confer, MD as Consulting Physician (General Surgery) Melrose Nakayama, MD as Consulting Physician (Cardiothoracic Surgery) Rozetta Nunnery, MD (Inactive) as Consulting Physician (Otolaryngology) Griselda Miner, MD as Consulting Physician (Dermatology)  PLACE OF SERVICE:  [place]  Advanced Directive information    Allergies  Allergen Reactions   Heparin     Blood clots    Penicillin G Shortness Of Breath   Levaquin [Levofloxacin In D5w] Other (See Comments)    Patient felt jittery and nervous with insomnia on the medication.   Aspirin     Breathing complications, can tolerate baby asa   Codeine     Some is ok   Sucralfate     Could not sleep   Zetia [Ezetimibe]     jittery   Zocor [Simvastatin]     Liver enzymes increased   Plavix [Clopidogrel Bisulfate] Palpitations    No chief complaint on file.    HPI: Patient is a 86 y.o. male routine visit follow-up anxiety, atrial fibs, and hyperlipidemia.  He sees cardiology for atrial fibs.  Has follow-up appointment later this week and would like lipids checked prior to cardiology appointment.  Continues to use Valium and alprazolam.  Valium is more of a muscle relaxant in his mind on alprazolam works on his anxieties. He does describe some arthritic aches and pains in his wrists and weightbearing joints. Overall I think he is doing well without any major changes since last visit.  Review of Systems:  Review of Systems   Constitutional: Negative.   Respiratory: Negative.    Cardiovascular:  Positive for palpitations.  Genitourinary: Negative.   Musculoskeletal:  Positive for back pain and joint pain.  All other systems reviewed and are negative.  Past Medical History:  Diagnosis Date   Allergic rhinitis due to pollen    Anginal pain ([place])    once in a while, none recent   Anxiety    Anxiety disorder    Asthma    exercise induced   Atrial fibrillation (HCC)    hx of   Basal cell carcinoma of skin of lip    Clotting disorder (HCC)    Coronary atherosclerosis of native coronary artery    with CABG 3/12  -MAZE procedure    Diastasis recti 05/07/2016   Difficulty urinating    Diverticulosis of colon (without mention of hemorrhage)    mild   Dysuria    Edema    Essential hypertension, benign    Heart attack ([place]) 7517   Helicobacter pylori (H. 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   History of anal fissures    History of cholelithiasis    Hyperlipidemia    Hypertrophy of prostate with urinary obstruction and other lower urinary tract symptoms (LUTS)    Impotence of organic origin    Internal hemorrhoids without mention of complication    Jaundice age 56   Long term (current) use of anticoagulants    Neutropenia ([place]) 10/07/2016   Nonspecific elevation of levels of transaminase or lactic acid dehydrogenase (LDH)  Other voice and resonance disorders    after surgery, took breathing tube out and damaged something   Pneumonia    Pulmonary embolism ([place]) 2012   s/p heart surgery   Pulmonary embolism ([place])    3/12   Rheumatic fever age 76   Skin cancer    Hx of squamous cell x2   Sleep apnea    no CPAP   Unspecified constipation    Past Surgical History:  Procedure Laterality Date   CARDIAC CATHETERIZATION     CHOLECYSTECTOMY N/A 11/18/2013   Procedure: LAPAROSCOPIC CHOLECYSTECTOMY With IOC;  Surgeon: Odis Hollingshead, MD;  Location: [facility];  Service: General;   Laterality: N/A;   CORONARY ARTERY BYPASS GRAFT  2012   Median sternotomy, extracorporeal circulation, coronary   HERNIA REPAIR  2010   bilateral   HYDROCELE EXCISION     dr [name]    [facility] SURGERY     squamus x2; [facility] fo bridge of nose June 2017.   ROOT CANAL  12/2020   Pr patient he had complications    SCROTUM EXPLORATION  1990's   multiple   SPERMATOCELECTOMY  02/1999   Right, Dr.[name]   SUPERFICIAL SKIN CYSTECTOMY (l) HIP Right 1997   dr [name]   VASECTOMY  04/1992   Social History:   reports that he quit smoking about 44 years ago. His smoking use included cigarettes. He started smoking about 65 years ago. He has a 22.00 pack-year smoking history. He has never used smokeless tobacco. He reports current alcohol use. He reports that he does not use drugs.  Family History  Problem Relation Age of Onset   Bone cancer Mother    Diabetes Mother    Colon cancer Neg Hx    Esophageal cancer Neg Hx    Rectal cancer Neg Hx    Stomach cancer Neg Hx    Lung disease Neg Hx     Medications: Patient's Medications  New Prescriptions   No medications on file  Previous Medications   ACETAMINOPHEN (TYLENOL) 325 MG TABLET    Take 325 mg by mouth at bedtime.   ALBUTEROL (PROVENTIL HFA;VENTOLIN HFA) 108 (90 BASE) MCG/ACT INHALER    Inhale 2 puffs into the lungs every 6 (six) hours as needed for wheezing.   ALPRAZOLAM (XANAX) 0.5 MG TABLET    Take One to Two tablets by mouth once daily as needed for anxiety.   APIXABAN (ELIQUIS) 2.5 MG TABS TABLET    Take 1 tablet (2.5 mg total) by mouth 2 (two) times daily.   ASPIRIN EC 81 MG TABLET    Take 1 tablet (81 mg total) by mouth every Monday, Wednesday, and Friday. Swallow whole.   COVID-19 MRNA VAC-TRIS, PFIZER, (PFIZER-BIONT COVID-19 VAC-TRIS) SUSP INJECTION    Inject into the muscle.   DIAZEPAM (VALIUM) 5 MG TABLET    Take One to Two tablets by mouth at bedtime as needed for rest.   FLUTICASONE FUROATE (FLONASE SENSIMIST NA)    Place 1 spray into  both nostrils daily.   HYDROCORTISONE (ANUSOL-HC) 2.5 % RECTAL CREAM    Place 1 application rectally 2 (two) times daily. Use pea sized amount rectally twice daily for 7 days   MONTELUKAST (SINGULAIR) 10 MG TABLET    Take 1 tablet (10 mg total) by mouth at bedtime.   NYSTATIN (MYCOSTATIN) 100000 UNIT/ML SUSPENSION    Take 5 mLs (500,000 Units total) by mouth 2 (two) times daily.   ROSUVASTATIN (CRESTOR) 10 MG TABLET  Take 1 tablet (10 mg total) by mouth daily.  Modified Medications   No medications on file  Discontinued Medications   No medications on file    Physical Exam:  There were no vitals filed for this visit. There is no height or weight on file to calculate BMI. Wt Readings from Last 3 Encounters:  10/02/21 184 lb 6.4 oz (83.6 kg)  08/07/21 185 lb 3.2 oz (84 kg)  08/06/21 182 lb (82.6 kg)    Physical Exam Vitals and nursing note reviewed.  Constitutional:      Appearance: Normal appearance.  Cardiovascular:     Rate and Rhythm: Normal rate. Rhythm irregular.     Comments: Occasional extrasystoles, most likely PACs Pulmonary:     Effort: Pulmonary effort is normal.     Breath sounds: Normal breath sounds.  Neurological:     General: No focal deficit present.     Mental Status: He is alert and oriented to person, place, and time.  Psychiatric:        Mood and Affect: Mood normal.        Behavior: Behavior normal.        Thought Content: Thought content normal.        Judgment: Judgment normal.    Labs reviewed: Basic Metabolic Panel: Recent Labs    07/12/21 1329  NA 138  K 4.8  CL 100  CO2 31  GLUCOSE 147*  BUN 17  CREATININE 1.12  CALCIUM 9.2   Liver Function Tests: No results for input(s): AST, ALT, ALKPHOS, BILITOT, PROT, ALBUMIN in the last 8760 hours. No results for input(s): LIPASE, AMYLASE in the last 8760 hours. No results for input(s): AMMONIA in the last 8760 hours. CBC: Recent Labs    07/12/21 1329  WBC 8.1  HGB 16.3  HCT 47.5  MCV  93.3  PLT 211   Lipid Panel: No results for input(s): CHOL, HDL, LDLCALC, TRIG, CHOLHDL, LDLDIRECT in the last 8760 hours. TSH: No results for input(s): TSH in the last 8760 hours. A1C: Lab Results  Component Value Date   HGBA1C 5.9 (H) 01/28/2021     Assessment/Plan  1. Coronary artery disease involving coronary bypass graft of native heart without angina pectoris Followed by cardiology.  Goal LDL less than 70  2. Essential hypertension Blood pressure good today 126/72  3. Anxiety state Has been on benzodiazepines for 30+ years and does not abuse  4. Paroxysmal atrial fibrillation (HCC) Appears to be in sinus rhythm.  Had issues taking anticoagulants so he is not on any at this time  5. Other hyperlipidemia We will assess lipids today.  Takes rosuvastatin 10 mg without incident   Alain Honey, MD [place] [phone]

## 2022-02-05 LAB — COMPLETE METABOLIC PANEL WITH GFR
AG Ratio: 1.4 (calc) (ref 1.0–2.5)
ALT: 15 U/L (ref 9–46)
AST: 22 U/L (ref 10–35)
Albumin: 4.2 g/dL (ref 3.6–5.1)
Alkaline phosphatase (APISO): 54 U/L (ref 35–144)
BUN: 10 mg/dL (ref 7–25)
CO2: 31 mmol/L (ref 20–32)
Calcium: 9.4 mg/dL (ref 8.6–10.3)
Chloride: 101 mmol/L (ref 98–110)
Creat: 0.91 mg/dL (ref 0.70–1.22)
Globulin: 3 g/dL (calc) (ref 1.9–3.7)
Glucose, Bld: 98 mg/dL (ref 65–99)
Potassium: 4.7 mmol/L (ref 3.5–5.3)
Sodium: 139 mmol/L (ref 135–146)
Total Bilirubin: 1.4 mg/dL — ABNORMAL HIGH (ref 0.2–1.2)
Total Protein: 7.2 g/dL (ref 6.1–8.1)
eGFR: 84 mL/min/{1.73_m2} (ref >=60)

## 2022-02-05 LAB — LIPID PANEL
Cholesterol: 160 mg/dL (ref <200)
HDL: 60 mg/dL (ref >=40)
LDL Cholesterol (Calc): 84 mg/dL (calc)
Non-HDL Cholesterol (Calc): 100 mg/dL (calc) (ref <130)
Total CHOL/HDL Ratio: 2.7 (calc) (ref <5.0)
Triglycerides: 73 mg/dL (ref <150)

## 2022-02-05 NOTE — Progress Notes (Signed)
Cardiology Office Note:    Date:  02/07/2022   ID:  Alexander [name], DOB [birth year], MRN [id number]  PCP:  Wardell Honour, MD  Cardiologist:  Sinclair Grooms, MD   Referring MD: Wardell Honour, MD   Chief Complaint  Patient presents with   Coronary Artery Disease   Atrial Fibrillation    History of Present Illness:    Alexander [name] is a 86 y.o. male with a hx of CAD with CABG (LIMA LAD; SVG Diagonal 1; SVG distal RCA)+ Maze with ligation of left atrial appendage 2012, subsequent PAF and refused anticoagulation/dofetilide, primary hypertension, hyperlipidemia, heparin induced thrombocytopenia, and possible statin and other medication intolerance(anticoagulants -ALL). ( After extensive conversation, it has been resolved today through shared decision making that the patient will not be on anticoagulation, except antiarrhythmic therapy, and will not consider ablation as management strategies for A-fib/a flutter.  No further conversations are necessary concerning this issue.)  He is doing well.  He feels that Nasonex and albuterol triggered atrial flutter last year.  He saw Dr. Quentin Ore, conversations concerning antiarrhythmic therapy, cardioversion, and ablation occurred.  The patient went silent concerning this.  He did accept anticoagulation therapy but could not tolerate the medications.  Did not want dofetilide therapy.  Is currently taking aspirin 3 times per week.  Feels that he has been in normal rhythm now for greater than 3 months.  Does not feel that he has had any recurrences over that timeframe.  Through shared decision-making today, the patient is better protected against embolic stroke since he has had left atrial appendage occlusion surgically.  He is intolerant of anticoagulants from warfarin to DOAC's.  He will not take these medications from the standpoint of anxiety and concerned about bleeding as well as intolerance.  Therefore, the patient will have only medical  management of atrial fibrillation and atrial flutter going forward.  We did discuss the detrimental down stream functional abnormalities that can occur with continuous atrial fibrillation and atrial flutter, and the patient states that he is willing to accept.     Past Medical History:  Diagnosis Date   Allergic rhinitis due to pollen    Anginal pain ([place])    once in a while, none recent   Anxiety    Anxiety disorder    Asthma    exercise induced   Atrial fibrillation (HCC)    hx of   Basal cell carcinoma of skin of lip    Clotting disorder (HCC)    Coronary atherosclerosis of native coronary artery    with CABG 3/12  -MAZE procedure    Diastasis recti 05/07/2016   Difficulty urinating    Diverticulosis of colon (without mention of hemorrhage)    mild   Dysuria    Edema    Essential hypertension, benign    Heart attack ([place]) 0938   Helicobacter pylori (H. 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   History of anal fissures    History of cholelithiasis    Hyperlipidemia    Hypertrophy of prostate with urinary obstruction and other lower urinary tract symptoms (LUTS)    Impotence of organic origin    Internal hemorrhoids without mention of complication    Jaundice age 78   Long term (current) use of anticoagulants    Neutropenia ([place]) 10/07/2016   Nonspecific elevation of levels of transaminase or lactic acid dehydrogenase (LDH)    Other voice and resonance disorders  after surgery, took breathing tube out and damaged something   Pneumonia    Pulmonary embolism ([place]) 2012   s/p heart surgery   Pulmonary embolism ([place])    3/12   Rheumatic fever age 52   Skin cancer    Hx of squamous cell x2   Sleep apnea    no CPAP   Unspecified constipation     Past Surgical History:  Procedure Laterality Date   CARDIAC CATHETERIZATION     CHOLECYSTECTOMY N/A 11/18/2013   Procedure: LAPAROSCOPIC CHOLECYSTECTOMY With IOC;  Surgeon: Odis Hollingshead, MD;  Location:  [facility];  Service: General;  Laterality: N/A;   CORONARY ARTERY BYPASS GRAFT  2012   Median sternotomy, extracorporeal circulation, coronary   HERNIA REPAIR  2010   bilateral   HYDROCELE EXCISION     dr [name]    [facility] SURGERY     squamus x2; [facility] fo bridge of nose June 2017.   ROOT CANAL  12/2020   Pr patient he had complications    SCROTUM EXPLORATION  1990's   multiple   SPERMATOCELECTOMY  02/1999   Right, Dr.[name]   SUPERFICIAL SKIN CYSTECTOMY (l) HIP Right 1997   dr [name]   VASECTOMY  04/1992    Current Medications: Current Meds  Medication Sig   acetaminophen (TYLENOL) 325 MG tablet Take 325 mg by mouth at bedtime.   albuterol (PROVENTIL HFA;VENTOLIN HFA) 108 (90 BASE) MCG/ACT inhaler Inhale 2 puffs into the lungs every 6 (six) hours as needed for wheezing.   ALPRAZolam (XANAX) 0.5 MG tablet Take One to Two tablets by mouth once daily as needed for anxiety.   aspirin EC 81 MG tablet Take 1 tablet (81 mg total) by mouth every Monday, Wednesday, and Friday. Swallow whole.   diazepam (VALIUM) 5 MG tablet Take One to Two tablets by mouth at bedtime as needed for rest.   Fluticasone Furoate (FLONASE SENSIMIST NA) Place 1 spray into both nostrils daily.   hydrocortisone (ANUSOL-HC) 2.5 % rectal cream Place 1 application rectally 2 (two) times daily. Use pea sized amount rectally twice daily for 7 days   montelukast (SINGULAIR) 10 MG tablet Take 1 tablet (10 mg total) by mouth at bedtime.   rosuvastatin (CRESTOR) 10 MG tablet Take 1 tablet (10 mg total) by mouth daily.     Allergies:   Heparin, Penicillin g, Levaquin [levofloxacin in d5w], Aspirin, Codeine, Sucralfate, Zetia [ezetimibe], Zocor [simvastatin], and Plavix [clopidogrel bisulfate]   Social History   Socioeconomic History   Marital status: Married    Spouse name: Not on file   Number of children: 1   Years of education: Not on file   Highest education level: Not on file  Occupational History   Occupation: Administrator, Civil Service: [employer]  Tobacco Use   Smoking status: Former    Packs/day: 1.00    Years: 22.00    Pack years: 22.00    Types: Cigarettes    Start date: 08/22/1956    Quit date: 12/08/1977    Years since quitting: 44.1   Smokeless tobacco: Never   Tobacco comments:    significant second-hand smoke exposure at work  Scientific laboratory technician Use: Never used  Substance and Sexual Activity   Alcohol use: Yes    Alcohol/week: 0.0 standard drinks    Comment: former heavy alcohol use, rare use   Drug use: No   Sexual activity: Yes    Partners: Female  Other Topics Concern  Not on file  Social History Narrative   Daily caffeine       [name]   [name]   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 No pets currently. No hot tub or bird exposure.    Married wife Social research officer, government   Social Determinants of Radio broadcast assistant Strain: Not on file  Food Insecurity: Not on file  Transportation Needs: Not on file  Physical Activity: Not on file  Stress: Not on file  Social Connections: Not on file     Family History: The patient's family history includes Bone cancer in his mother; Diabetes in his mother. There is no history of Colon cancer, Esophageal cancer, Rectal cancer, Stomach cancer, or Lung disease.  ROS:   Please see the history of present illness.    Anxiety continues as his significant medical concern.  All other systems reviewed and are negative.  EKGs/Labs/Other Studies Reviewed:    The following studies were reviewed today: No new data  EKG:   EKG Performed 10/02/2021 reveals atrial flutter with 4 and 1 AV conduction at a rate of 75 bpm.  Electrocardiogram performed today demonstrates sinus rhythm, first-degree AV block 250 ms, and otherwise normal tracing with 1 PVC.  Recent Labs: 07/12/2021: Hemoglobin 16.3; Platelets 211 02/04/2022: ALT 15; BUN 10; Creat 0.91; Potassium 4.7; Sodium 139  Recent Lipid Panel    Component Value Date/Time   CHOL 160 02/04/2022 1111   CHOL 168 05/02/2016 0826   TRIG 73 02/04/2022 1111   HDL 60 02/04/2022 1111   HDL 56 05/02/2016 0826   CHOLHDL 2.7 02/04/2022 1111   VLDL 17 10/07/2016 0950   LDLCALC 84 02/04/2022 1111    Physical Exam:    VS:  BP 122/60    Pulse 75    Ht 5\' 9"  (1.753 m)    Wt 188 lb (85.3 kg)    SpO2 97%    BMI 27.76 kg/m     Wt Readings from Last 3 Encounters:  02/07/22 188 lb (85.3 kg)  02/04/22 183 lb 6.4 oz (83.2 kg)  10/02/21 184 lb 6.4 oz (83.6 kg)     GEN: Healthy appearing. No acute distress HEENT: Normal NECK: No JVD. LYMPHATICS: No lymphadenopathy CARDIAC: No murmur. RRR S4 gallop, or edema. VASCULAR:  Normal Pulses. No bruits. RESPIRATORY:  Clear to auscultation without rales, wheezing or rhonchi  ABDOMEN: Soft, non-tender, non-distended, No pulsatile mass, MUSCULOSKELETAL: No deformity  SKIN: Warm and dry NEUROLOGIC:  Alert and oriented x 3 PSYCHIATRIC:  Normal affect   ASSESSMENT:    1. Persistent atrial fibrillation ([place])   2. Typical atrial flutter ([place])   3. Essential hypertension   4. Coronary artery disease involving coronary bypass graft of native heart without angina pectoris   5. Obstructive sleep apnea   6. Other hyperlipidemia    PLAN:    In order of problems listed above:  Continue medical management.  The patient refuses anticoagulation therapy/has intolerances.  He has had left atrial appendage ligation at the time of bypass surgery 11 years ago.  He does have symptoms that make him aware of the presence or absence of atrial  fibrillation.  He  feels there have been no episodes over the past 3 months since his conversations with electrophysiology.  He stopped responding to recommendations because he has decided to go purely with medical management and no additional procedures or medications for atrial fibs. Same as above Excellent blood pressure control Secondary prevention discussed Sleep apnea but chooses not to use CPAP.  This is a risk factor for recurrent atrial fibrillation. Continue Crestor.   After extensive conversation, it has been resolved today through shared decision making that the patient will not be on anticoagulation, except antiarrhythmic therapy, and will not consider ablation as management strategies for A-fib/a flutter.  No further conversations are necessary concerning this issue.   Medication Adjustments/Labs and Tests Ordered: Current medicines are reviewed at length with the patient today.  Concerns regarding medicines are outlined above.  No orders of the defined types were placed in this encounter.  No orders of the defined types were placed in this encounter.   Patient Instructions  Medication Instructions:  Your physician recommends that you continue on your current medications as directed. Please refer to the Current Medication list given to you today.  *If you need a refill on your cardiac medications before your next appointment, please call your pharmacy*   Lab Work: None If you have labs (blood work) drawn today and your tests are completely normal, you will receive your results only by: [place] (if you have MyChart) OR A paper copy in the mail If you have any lab test that is abnormal or we need to change your treatment, we will call you to review the results.   Testing/Procedures: None   Follow-Up: At [facility], you and your health needs are our priority.  As part of our continuing mission to provide you with exceptional heart care, we have created  designated Provider Care Teams.  These Care Teams include your primary Cardiologist (physician) and Advanced Practice Providers (APPs -  Physician Assistants and Nurse Practitioners) who all work together to provide you with the care you need, when you need it.  We recommend signing up for the patient portal called "MyChart".  Sign up information is provided on this After Visit Summary.  MyChart is used to connect with patients for Virtual Visits (Telemedicine).  Patients are able to view lab/test results, encounter notes, upcoming appointments, etc.  Non-urgent messages can be sent to your provider as well.   To learn more about what you can do with MyChart, go to [email or web address].    Your next appointment:   1 year(s)  The format for your next appointment:   In Person  Provider:   Sinclair Grooms, MD     Other Instructions     Signed, Sinclair Grooms, MD  02/07/2022 8:49 AM    [place]

## 2022-02-07 ENCOUNTER — Encounter: Payer: Self-pay | Admitting: Interventional Cardiology

## 2022-02-07 ENCOUNTER — Ambulatory Visit: Payer: Medicare Other | Admitting: Interventional Cardiology

## 2022-02-07 ENCOUNTER — Other Ambulatory Visit: Payer: Self-pay

## 2022-02-07 VITALS — BP 122/60 | HR 75 | Ht 69.0 in | Wt 188.0 lb

## 2022-02-07 DIAGNOSIS — E7849 Other hyperlipidemia: Secondary | ICD-10-CM

## 2022-02-07 DIAGNOSIS — I10 Essential (primary) hypertension: Secondary | ICD-10-CM | POA: Diagnosis not present

## 2022-02-07 DIAGNOSIS — I4819 Other persistent atrial fibrillation: Secondary | ICD-10-CM

## 2022-02-07 DIAGNOSIS — G4733 Obstructive sleep apnea (adult) (pediatric): Secondary | ICD-10-CM | POA: Diagnosis not present

## 2022-02-07 DIAGNOSIS — I25810 Atherosclerosis of coronary artery bypass graft(s) without angina pectoris: Secondary | ICD-10-CM | POA: Diagnosis not present

## 2022-02-07 DIAGNOSIS — I483 Typical atrial flutter: Secondary | ICD-10-CM | POA: Diagnosis not present

## 2022-02-07 NOTE — Patient Instructions (Signed)

## 2022-02-13 ENCOUNTER — Telehealth: Payer: Self-pay

## 2022-02-13 ENCOUNTER — Other Ambulatory Visit: Payer: Self-pay

## 2022-02-13 ENCOUNTER — Encounter: Payer: Self-pay | Admitting: Nurse Practitioner

## 2022-02-13 ENCOUNTER — Ambulatory Visit (INDEPENDENT_AMBULATORY_CARE_PROVIDER_SITE_OTHER): Payer: Medicare Other | Admitting: Nurse Practitioner

## 2022-02-13 DIAGNOSIS — Z0000 Encounter for general adult medical examination without abnormal findings: Secondary | ICD-10-CM | POA: Diagnosis not present

## 2022-02-13 NOTE — Progress Notes (Signed)
Subjective:   Alexander [name] is a 86 y.o. male who presents for Medicare Annual/Subsequent preventive examination.  Review of Systems     Cardiac Risk Factors include: advanced age (>78mn, >>54women);hypertension;dyslipidemia;male gender     Objective:    Today's Vitals   02/13/22 1132  PainSc: 1    There is no height or weight on file to calculate BMI.  Advanced Directives 02/13/2022 10/02/2021 07/12/2021 07/10/2021 02/12/2021 01/31/2021 10/22/2020  Does Patient Have a Medical Advance Directive? Yes No No No No;Yes No No  Type of Advance Directive [place] - -  Does patient want to make changes to medical advance directive? No - Patient declined - - - No - Patient declined - -  Copy of [place] Chart? No - copy requested - - - - - -  Would patient like information on creating a medical advance directive? - No - Patient declined - No - Patient declined - Yes (ED - Information included in AVS) Yes (ED - Information included in AVS)  Pre-existing out of facility DNR order (yellow form or pink MOST form) - - - - - - -    Current Medications (verified) Outpatient Encounter Medications as of 02/13/2022  Medication Sig   acetaminophen (TYLENOL) 325 MG tablet Take 325 mg by mouth at bedtime.   albuterol (PROVENTIL HFA;VENTOLIN HFA) 108 (90 BASE) MCG/ACT inhaler Inhale 2 puffs into the lungs every 6 (six) hours as needed for wheezing.   ALPRAZolam (XANAX) 0.5 MG tablet Take One to Two tablets by mouth once daily as needed for anxiety.   aspirin EC 81 MG tablet Take 1 tablet (81 mg total) by mouth every Monday, Wednesday, and Friday. Swallow whole.   diazepam (VALIUM) 5 MG tablet Take One to Two tablets by mouth at bedtime as needed for rest.   hydrocortisone (ANUSOL-HC) 2.5 % rectal cream Place 1 application rectally 2 (two) times daily. Use pea sized amount rectally twice daily for 7 days   rosuvastatin (CRESTOR) 10 MG tablet Take 1  tablet (10 mg total) by mouth daily.   [DISCONTINUED] Fluticasone Furoate (FLONASE SENSIMIST NA) Place 1 spray into both nostrils daily.   [DISCONTINUED] montelukast (SINGULAIR) 10 MG tablet Take 1 tablet (10 mg total) by mouth at bedtime.   [DISCONTINUED] nystatin (MYCOSTATIN) 100000 UNIT/ML suspension Take 5 mLs (500,000 Units total) by mouth 2 (two) times daily. (Patient not taking: Reported on 02/07/2022)   No facility-administered encounter medications on file as of 02/13/2022.    Allergies (verified) Heparin, Penicillin g, Levaquin [levofloxacin in d5w], Aspirin, Codeine, Sucralfate, Zetia [ezetimibe], Zocor [simvastatin], and Plavix [clopidogrel bisulfate]   History: Past Medical History:  Diagnosis Date   Allergic rhinitis due to pollen    [place] pain [place]    once in a while, none recent   Anxiety    Anxiety disorder    Asthma    exercise induced   Atrial fibrillation (HCC)    hx of   Basal cell carcinoma of skin of lip    Clotting disorder (HCC)    Coronary atherosclerosis of native coronary artery    with CABG 3/12  -MAZE procedure    Diastasis recti 05/07/2016   Difficulty urinating    Diverticulosis of colon (without mention of hemorrhage)    mild   Dysuria    Edema    Essential hypertension, benign    Heart attack ([place] [address]  Helicobacter pylori (H.  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   History of anal fissures    History of cholelithiasis    Hyperlipidemia    Hypertrophy of prostate with urinary obstruction and other lower urinary tract symptoms (LUTS)    Impotence of organic origin    Internal hemorrhoids without mention of complication    Jaundice age 15   Long term (current) use of anticoagulants    Neutropenia ([place]) 10/07/2016   Nonspecific elevation of levels of transaminase or lactic acid dehydrogenase (LDH)    Other voice and resonance disorders    after surgery, took breathing tube out and damaged something   Pneumonia     Pulmonary embolism ([place]) 2012   s/p heart surgery   Pulmonary embolism ([place])    3/12   Rheumatic fever age 65   Skin cancer    Hx of squamous cell x2   Sleep apnea    no CPAP   Unspecified constipation    Past Surgical History:  Procedure Laterality Date   CARDIAC CATHETERIZATION     CHOLECYSTECTOMY N/A 11/18/2013   Procedure: LAPAROSCOPIC CHOLECYSTECTOMY With [facility];  Surgeon: Odis Hollingshead, MD;  Location: [facility];  Service: General;  Laterality: N/A;   CORONARY ARTERY BYPASS GRAFT  2012   Median sternotomy, extracorporeal circulation, coronary   HERNIA REPAIR  2010   bilateral   HYDROCELE EXCISION     dr [name]    [facility] SURGERY     squamus x2; [facility] fo bridge of nose June 2017.   ROOT CANAL  12/2020   Pr patient he had complications    SCROTUM EXPLORATION  1990's   multiple   SPERMATOCELECTOMY  02/1999   Right, Dr.[name]   SUPERFICIAL SKIN CYSTECTOMY (l) HIP Right 1997   dr [name]   VASECTOMY  04/1992   Family History  Problem Relation Age of Onset   Bone cancer Mother    Diabetes Mother    Colon cancer Neg Hx    Esophageal cancer Neg Hx    Rectal cancer Neg Hx    Stomach cancer Neg Hx    Lung disease Neg Hx    Social History   Socioeconomic History   Marital status: Married    Spouse name: Not on file   Number of children: 1   Years of education: Not on file   Highest education level: Not on file  Occupational History   Occupation: Presenter, broadcasting: [employer]  Tobacco Use   Smoking status: Former    Packs/day: 1.00    Years: 22.00    Pack years: 22.00    Types: Cigarettes    Start date: 08/22/1956    Quit date: 12/08/1977    Years since quitting: 44.2   Smokeless tobacco: Never   Tobacco comments:    significant second-hand smoke exposure at work  Scientific laboratory technician Use: Never used  Substance and Sexual Activity   Alcohol use: Not Currently    Comment: former heavy alcohol use, rare use   Drug use: No   Sexual activity: Yes     Partners: Female  Other Topics Concern   Not on file  Social History Narrative   Daily caffeine       [name]   [name]   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 No pets currently. No hot tub or bird exposure.    Married wife Social research officer, government   Social Determinants of Radio broadcast assistant Strain: Not on file  Food Insecurity: Not on file  Transportation Needs: Not on file  Physical Activity: Not on file  Stress: Not on file  Social Connections: Not on file    Tobacco Counseling Counseling given: Not Answered Tobacco comments: significant second-hand smoke exposure at work   Clinical Intake:  Pre-visit preparation completed: Yes  Pain : 0-10 Pain Score: 1  Pain Type: Acute pain Pain Location: Wrist Pain Orientation: Right Pain Radiating Towards: elbow Pain Onset: 1 to 4 weeks ago     BMI - recorded: 27 Nutritional Status: BMI 25 -29 Overweight  How often do you need to have someone help you when you read instructions, pamphlets, or other written materials from your doctor or pharmacy?: 1 - Never  Diabetic?no         Activities of Daily Living In your present state of health, do you have any difficulty performing the following activities: 02/13/2022  Hearing? Y  Vision? N  Difficulty concentrating or making decisions? N  Walking or climbing stairs? N  Dressing or bathing? N  Doing errands, shopping? N  Preparing Food and eating ? N  Using the Toilet? N  In the past six months, have you accidently leaked urine? N  Do you have problems with loss  of bowel control? N  Managing your Medications? N  Managing your Finances? N  Housekeeping or managing your Housekeeping? N  Some recent data might be hidden    Patient Care Team: Wardell Honour, MD as PCP - General (Family Medicine) Belva Crome, MD as PCP - Cardiology (Cardiology) Vickie Epley, MD as PCP - Electrophysiology (Cardiology) Javier Glazier, MD as Consulting Physician (Pulmonary Disease) Danella Sensing, MD as Consulting Physician (Dermatology) Jessy Oto, MD as Consulting Physician (Orthopedic Surgery) Irine Seal, MD as Attending Physician (Urology) Jackolyn Confer, MD as Consulting Physician (General Surgery) Melrose Nakayama, MD as Consulting Physician (Cardiothoracic Surgery) Rozetta Nunnery, MD (Inactive) as Consulting Physician (Otolaryngology) Griselda Miner, MD as Consulting Physician (Dermatology)  Indicate any recent Medical Services you may have received from other than [facility] providers in the past year (date may be approximate).     Assessment:   This is a routine wellness examination for Alexander [name].  Hearing/Vision screen Hearing Screening - Comments:: Patient wears hearing aids. Vision Screening - Comments:: Patient has no vision problems. Last eye exam about a couple of years ago. Patient sees Dr. Katy Fitch  Dietary issues and exercise activities discussed: Current Exercise Habits: Home exercise routine, Type of exercise: strength training/weights;stretching;walking, Time (Minutes): 60, Frequency (Times/Week): 5, Weekly Exercise (Minutes/Week): 300   Goals Addressed   None   Depression Screen PHQ 2/9 Scores 02/13/2022 02/12/2021 01/31/2021 07/30/2020 01/30/2020 11/29/2019 01/27/2019  PHQ - 2 Score 0 0 0 0 0 0 0    Fall Risk Fall Risk  02/13/2022 02/04/2022 10/02/2021 08/07/2021 07/10/2021  Falls in the past year? 0 0 0 0 0  Number falls in past yr: 0 0 0 0 0  Comment - - - - -  Injury with Fall? 0 0 0 0 0  Risk for fall due to : No  Fall Risks No Fall  Risks No Fall Risks No Fall Risks No Fall Risks  Follow up Falls evaluation completed Falls evaluation completed;Education provided;Falls prevention discussed Falls evaluation completed Falls evaluation completed;Education provided;Falls prevention discussed Falls evaluation completed    FALL RISK PREVENTION PERTAINING TO THE HOME:  Any stairs in or around the home? Yes  If so, are there any without handrails? No  Home free of loose throw rugs in walkways, pet beds, electrical cords, etc? Yes  Adequate lighting in your home to reduce risk of falls? Yes   ASSISTIVE DEVICES UTILIZED TO PREVENT FALLS:  Life alert? No  Use of a cane, walker or w/c? No  Grab bars in the bathroom? No  Shower chair or bench in shower? No  Elevated toilet seat or a handicapped toilet? No   TIMED UP AND GO:  Was the test performed? No .   MMSE - Mini Mental State Exam 11/26/2018 11/20/2017 10/07/2016 11/06/2015 10/18/2014  Not completed: - - - (No Data) -  Orientation to time '5 5 5 5 5  '$ Orientation to Place '5 5 5 5 5  '$ Registration '3 3 3 3 3  '$ Attention/ Calculation '5 5 5 5 5  '$ Recall '3 2 3 3 2  '$ Language- name 2 objects '2 2 2 2 2  '$ Language- repeat '1 1 1 1 1  '$ Language- follow 3 step command '3 3 3 2 3  '$ Language- read & follow direction '1 1 1 1 1  '$ Write a sentence '1 1 1 1 1  '$ Copy design '1 1 1 1 1  '$ Total score '30 29 30 29 29     '$ 6CIT Screen 02/13/2022 02/12/2021 11/29/2019  What Year? 0 points 0 points 0 points  What month? 0 points 0 points 0 points  What time? 0 points 0 points 0 points  Count back from 20 0 points 0 points 0 points  Months in reverse 0 points 0 points 2 points  Repeat phrase 0 points 0 points 0 points  Total Score 0 0 2    Immunizations Immunization History  Administered Date(s) Administered   H1N1 10/08/2008   Influenza Split 10/13/2013, 10/02/2014   Influenza, High Dose Seasonal PF 09/26/2015   Influenza,inj,Quad PF,6+ Mos 10/07/2016, 08/30/2018, 09/06/2019,  10/22/2020   Influenza-Unspecified 10/16/2006, 08/08/2008, 10/08/2009, 11/15/2012, 10/02/2014, 09/08/2015, 10/02/2016, 09/07/2018, 10/15/2020   PFIZER Comirnaty(Gray Top)Covid-19 Tri-Sucrose Vaccine 06/19/2021   PFIZER(Purple Top)SARS-COV-2 Vaccination 01/03/2020, 01/14/2020, 02/04/2020, 09/27/2020   Pfizer Covid-19 Vaccine Bivalent Booster 64yr & up 10/24/2021   Pneumococcal Conjugate-13 10/16/2010, 10/02/2014   Pneumococcal Polysaccharide-23 03/20/2006   Td 12/15/2007   Tdap 04/07/2008, 10/01/2019   Tetanus 12/09/1995    TDAP status: Up to date  Flu Vaccine status: Declined, Education has been provided regarding the importance of this vaccine but patient still declined. Advised may receive this vaccine at local pharmacy or Health Dept. Aware to provide a copy of the vaccination record if obtained from local pharmacy or Health Dept. Verbalized acceptance and understanding.  Pneumococcal vaccine status: Up to date  Covid-19 vaccine status: Completed vaccines  Qualifies for Shingles Vaccine? Yes   Zostavax completed No   Shingrix Completed?: No.    Education has been provided regarding the importance of this vaccine. Patient has been advised to call insurance company to determine out of pocket expense if they have not yet received this vaccine. Advised may also receive vaccine at local pharmacy or Health Dept. Verbalized acceptance and understanding.  Screening Tests Health Maintenance  Topic Date Due   INFLUENZA VACCINE  03/07/2022 (Originally 07/08/2021)   Zoster Vaccines- Shingrix (1 of 2) 05/16/2022 (Originally 08/22/1955)   TETANUS/TDAP  09/30/2029   Pneumonia Vaccine 47+ Years old  Completed   COVID-19 Vaccine  Completed   HPV VACCINES  Aged Out    Health Maintenance  There are no preventive care reminders to display for this patient.   Colorectal cancer screening: No longer required.   Lung Cancer Screening: (Low Dose CT Chest recommended if Age 31-80 years, 30 pack-year  currently smoking OR have quit w/in 15years.) does not qualify.   Lung Cancer Screening Referral: na  Additional Screening:  Hepatitis C Screening: does not qualify; Completed na  Vision Screening: Recommended annual ophthalmology exams for early detection of glaucoma and other disorders of the eye. Is the patient up to date with their annual eye exam?  No  Who is the provider or what is the name of the office in which the patient attends annual eye exams? [facility] If pt is not established with a provider, would they like to be referred to a provider to establish care? No .   Dental Screening: Recommended annual dental exams for proper oral hygiene  Community Resource Referral / Chronic Care Management: CRR required this visit?  No   CCM required this visit?  No      Plan:     I have personally reviewed and noted the following in the patients chart:   Medical and social history Use of alcohol, tobacco or illicit drugs  Current medications and supplements including opioid prescriptions. Patient is not currently taking opioid prescriptions. Functional ability and status Nutritional status Physical activity Advanced directives List of other physicians Hospitalizations, surgeries, and ER visits in previous 12 months Vitals Screenings to include cognitive, depression, and falls Referrals and appointments  In addition, I have reviewed and discussed with patient certain preventive protocols, quality metrics, and best practice recommendations. A written personalized care plan for preventive services as well as general preventive health recommendations were provided to patient.     Lauree Chandler, NP   02/13/2022    Virtual Visit via Telephone Note  I connected with patient 02/13/22 at  4:15 PM EST by telephone and verified that I am speaking with the correct person using two identifiers.  Location: Patient: home Provider: [facility]   I discussed the limitations, risks,  security and privacy concerns of performing an evaluation and management service by telephone and the availability of in person appointments. I also discussed with the patient that there may be a patient responsible charge related to this service. The patient expressed understanding and agreed to proceed.   I discussed the assessment and treatment plan with the patient. The patient was provided an opportunity to ask questions and all were answered. The patient agreed with the plan and demonstrated an understanding of the instructions.   The patient was advised to call back or seek an in-person evaluation if the symptoms worsen or if the condition fails to improve as anticipated.  I provided 14 minutes of non-face-to-face time during this encounter.  Carlos American. Harle Battiest Avs printed and mailed

## 2022-02-13 NOTE — Patient Instructions (Addendum)
Mr. Alexander [name] , Thank you for taking time to come for your Medicare Wellness Visit. I appreciate your ongoing commitment to your health goals. Please review the following plan we discussed and let me know if I can assist you in the future.   Screening recommendations/referrals: Colonoscopy aged out Recommended yearly ophthalmology/optometry visit for glaucoma screening and checkup Recommended yearly dental visit for hygiene and checkup  Vaccinations: Influenza vaccine- declined.  Pneumococcal vaccine up to date Tdap vaccine up to date Shingles vaccine decline    Advanced directives: to bring copy to place on file.   Conditions/risks identified: advance age, hyperlipidemia, hypertension, male  Next appointment: yearly   Preventive Care 25 Years and Older, Male Preventive care refers to lifestyle choices and visits with your health care provider that can promote health and wellness. What does preventive care include? A yearly physical exam. This is also called an annual well check. Dental exams once or twice a year. Routine eye exams. Ask your health care provider how often you should have your eyes checked. Personal lifestyle choices, including: Daily care of your teeth and gums. Regular physical activity. Eating a healthy diet. Avoiding tobacco and drug use. Limiting alcohol use. Practicing safe sex. Taking low doses of aspirin every day. Taking vitamin and mineral supplements as recommended by your health care provider. What happens during an annual well check? The services and screenings done by your health care provider during your annual well check will depend on your age, overall health, lifestyle risk factors, and family history of disease. Counseling  Your health care provider may ask you questions about your: Alcohol use. Tobacco use. Drug use. Emotional well-being. Home and relationship well-being. Sexual activity. Eating habits. History of falls. Memory and  ability to understand (cognition). Work and work Statistician. Screening  You may have the following tests or measurements: Height, weight, and BMI. Blood pressure. Lipid and cholesterol levels. These may be checked every 5 years, or more frequently if you are over 28 years old. Skin check. Lung cancer screening. You may have this screening every year starting at age 42 if you have a 30-pack-year history of smoking and currently smoke or have quit within the past 15 years. Fecal occult blood test (FOBT) of the stool. You may have this test every year starting at age 47. Flexible sigmoidoscopy or colonoscopy. You may have a sigmoidoscopy every 5 years or a colonoscopy every 10 years starting at age 23. Prostate cancer screening. Recommendations will vary depending on your family history and other risks. Hepatitis C blood test. Hepatitis B blood test. Sexually transmitted disease (STD) testing. Diabetes screening. This is done by checking your blood sugar (glucose) after you have not eaten for a while (fasting). You may have this done every 1-3 years. Abdominal aortic aneurysm (AAA) screening. You may need this if you are a current or former smoker. Osteoporosis. You may be screened starting at age 27 if you are at high risk. Talk with your health care provider about your test results, treatment options, and if necessary, the need for more tests. Vaccines  Your health care provider may recommend certain vaccines, such as: Influenza vaccine. This is recommended every year. Tetanus, diphtheria, and acellular pertussis (Tdap, Td) vaccine. You may need a Td booster every 10 years. Zoster vaccine. You may need this after age 76. Pneumococcal 13-valent conjugate (PCV13) vaccine. One dose is recommended after age 4. Pneumococcal polysaccharide (PPSV23) vaccine. One dose is recommended after age 80. Talk to your health care provider  about which screenings and vaccines you need and how often you need  them. This information is not intended to replace advice given to you by your health care provider. Make sure you discuss any questions you have with your health care provider. Document Released: 12/21/2015 Document Revised: 08/13/2016 Document Reviewed: 09/25/2015 Elsevier Interactive Patient Education  2017 [employer] Prevention in the Home Falls can cause injuries. They can happen to people of all ages. There are many things you can do to make your home safe and to help prevent falls. What can I do on the outside of my home? Regularly fix the edges of walkways and driveways and fix any cracks. Remove anything that might make you trip as you walk through a door, such as a raised step or threshold. Trim any bushes or trees on the path to your home. Use bright outdoor lighting. Clear any walking paths of anything that might make someone trip, such as rocks or tools. Regularly check to see if handrails are loose or broken. Make sure that both sides of any steps have handrails. Any raised decks and porches should have guardrails on the edges. Have any leaves, snow, or ice cleared regularly. Use sand or salt on walking paths during winter. Clean up any spills in your garage right away. This includes oil or grease spills. What can I do in the bathroom? Use night lights. Install grab bars by the toilet and in the tub and shower. Do not use towel bars as grab bars. Use non-skid mats or decals in the tub or shower. If you need to sit down in the shower, use a plastic, non-slip stool. Keep the floor dry. Clean up any water that spills on the floor as soon as it happens. Remove soap buildup in the tub or shower regularly. Attach bath mats securely with double-sided non-slip rug tape. Do not have throw rugs and other things on the floor that can make you trip. What can I do in the bedroom? Use night lights. Make sure that you have a light by your bed that is easy to reach. Do not use  any sheets or blankets that are too big for your bed. They should not hang down onto the floor. Have a firm chair that has side arms. You can use this for support while you get dressed. Do not have throw rugs and other things on the floor that can make you trip. What can I do in the kitchen? Clean up any spills right away. Avoid walking on wet floors. Keep items that you use a lot in easy-to-reach places. If you need to reach something above you, use a strong step stool that has a grab bar. Keep electrical cords out of the way. Do not use floor polish or wax that makes floors slippery. If you must use wax, use non-skid floor wax. Do not have throw rugs and other things on the floor that can make you trip. What can I do with my stairs? Do not leave any items on the stairs. Make sure that there are handrails on both sides of the stairs and use them. Fix handrails that are broken or loose. Make sure that handrails are as long as the stairways. Check any carpeting to make sure that it is firmly attached to the stairs. Fix any carpet that is loose or worn. Avoid having throw rugs at the top or bottom of the stairs. If you do have throw rugs, attach them to the floor  with carpet tape. Make sure that you have a light switch at the top of the stairs and the bottom of the stairs. If you do not have them, ask someone to add them for you. What else can I do to help prevent falls? Wear shoes that: Do not have high heels. Have rubber bottoms. Are comfortable and fit you well. Are closed at the toe. Do not wear sandals. If you use a stepladder: Make sure that it is fully opened. Do not climb a closed stepladder. Make sure that both sides of the stepladder are locked into place. Ask someone to hold it for you, if possible. Clearly mark and make sure that you can see: Any grab bars or handrails. First and last steps. Where the edge of each step is. Use tools that help you move around (mobility aids)  if they are needed. These include: Canes. Walkers. Scooters. Crutches. Turn on the lights when you go into a dark area. Replace any light bulbs as soon as they burn out. Set up your furniture so you have a clear path. Avoid moving your furniture around. If any of your floors are uneven, fix them. If there are any pets around you, be aware of where they are. Review your medicines with your doctor. Some medicines can make you feel dizzy. This can increase your chance of falling. Ask your doctor what other things that you can do to help prevent falls. This information is not intended to replace advice given to you by your health care provider. Make sure you discuss any questions you have with your health care provider. Document Released: 09/20/2009 Document Revised: 05/01/2016 Document Reviewed: 12/29/2014 Elsevier Interactive Patient Education  2017 [employer].

## 2022-02-13 NOTE — Telephone Encounter (Signed)
Mr. zorawar, strollo are scheduled for a virtual visit with your provider today.   ? ?Just as we do with appointments in the office, we must obtain your consent to participate.  Your consent will be active for this visit and any virtual visit you may have with one of our providers in the next 365 days.   ? ?If you have a MyChart account, I can also send a copy of this consent to you electronically.  All virtual visits are billed to your insurance company just like a traditional visit in the office.  As this is a virtual visit, video technology does not allow for your provider to perform a traditional examination.  This may limit your provider's ability to fully assess your condition.  If your provider identifies any concerns that need to be evaluated in person or the need to arrange testing such as labs, EKG, etc, we will make arrangements to do so.   ? ?Although advances in technology are sophisticated, we cannot ensure that it will always work on either your end or our end.  If the connection with a video visit is poor, we may have to switch to a telephone visit.  With either a video or telephone visit, we are not always able to ensure that we have a secure connection.   I need to obtain your verbal consent now.   Are you willing to proceed with your visit today?  ? ?LEODIS ALCOCER has provided verbal consent on 02/13/2022 for a virtual visit (video or telephone). ? ? ?Carroll Kinds, CMA ?02/13/2022  11:28 AM ?  ?

## 2022-02-13 NOTE — Progress Notes (Signed)
This service is provided via telemedicine ? ?No vital signs collected/recorded due to the encounter was a telemedicine visit.  ? ?Location of patient (ex: home, work):  Home ? ?Patient consents to a telephone visit:  Yes, see encounter dated 02/13/2022 ? ?Location of the provider (ex: office, home):  [place] ? ?Name of any referring provider:  Alain Honey, MD ? ?Names of all persons participating in the telemedicine service and their role in the encounter:  Sherrie Mustache, Nurse Practitioner, Carroll Kinds, CMA, and patient.  ? ?Time spent on call:  11 minutes with medical assistant ? ?

## 2022-02-14 ENCOUNTER — Encounter: Payer: Medicare Other | Admitting: Nurse Practitioner

## 2022-04-22 DIAGNOSIS — D225 Melanocytic nevi of trunk: Secondary | ICD-10-CM | POA: Diagnosis not present

## 2022-04-22 DIAGNOSIS — L820 Inflamed seborrheic keratosis: Secondary | ICD-10-CM | POA: Diagnosis not present

## 2022-04-22 DIAGNOSIS — Z85828 Personal history of other malignant neoplasm of skin: Secondary | ICD-10-CM | POA: Diagnosis not present

## 2022-04-22 DIAGNOSIS — L570 Actinic keratosis: Secondary | ICD-10-CM | POA: Diagnosis not present

## 2022-04-22 DIAGNOSIS — D485 Neoplasm of uncertain behavior of skin: Secondary | ICD-10-CM | POA: Diagnosis not present

## 2022-04-22 DIAGNOSIS — L821 Other seborrheic keratosis: Secondary | ICD-10-CM | POA: Diagnosis not present

## 2022-06-03 DIAGNOSIS — H04123 Dry eye syndrome of bilateral lacrimal glands: Secondary | ICD-10-CM | POA: Diagnosis not present

## 2022-06-03 DIAGNOSIS — H26493 Other secondary cataract, bilateral: Secondary | ICD-10-CM | POA: Diagnosis not present

## 2022-06-03 DIAGNOSIS — H43812 Vitreous degeneration, left eye: Secondary | ICD-10-CM | POA: Diagnosis not present

## 2022-06-03 DIAGNOSIS — Z961 Presence of intraocular lens: Secondary | ICD-10-CM | POA: Diagnosis not present

## 2022-07-03 DIAGNOSIS — D485 Neoplasm of uncertain behavior of skin: Secondary | ICD-10-CM | POA: Diagnosis not present

## 2022-07-03 DIAGNOSIS — L820 Inflamed seborrheic keratosis: Secondary | ICD-10-CM | POA: Diagnosis not present

## 2022-07-03 DIAGNOSIS — Z85828 Personal history of other malignant neoplasm of skin: Secondary | ICD-10-CM | POA: Diagnosis not present

## 2022-07-03 DIAGNOSIS — L821 Other seborrheic keratosis: Secondary | ICD-10-CM | POA: Diagnosis not present

## 2022-07-03 DIAGNOSIS — L739 Follicular disorder, unspecified: Secondary | ICD-10-CM | POA: Diagnosis not present

## 2022-07-03 DIAGNOSIS — L570 Actinic keratosis: Secondary | ICD-10-CM | POA: Diagnosis not present

## 2022-07-04 ENCOUNTER — Encounter: Payer: Self-pay | Admitting: Gastroenterology

## 2022-08-05 ENCOUNTER — Encounter: Payer: Self-pay | Admitting: Family Medicine

## 2022-08-05 ENCOUNTER — Ambulatory Visit (INDEPENDENT_AMBULATORY_CARE_PROVIDER_SITE_OTHER): Payer: Medicare Other | Admitting: Family Medicine

## 2022-08-05 VITALS — BP 150/62 | HR 62 | Temp 97.1°F | Resp 20 | Ht 69.0 in | Wt 188.0 lb

## 2022-08-05 DIAGNOSIS — F411 Generalized anxiety disorder: Secondary | ICD-10-CM

## 2022-08-05 DIAGNOSIS — G4700 Insomnia, unspecified: Secondary | ICD-10-CM | POA: Diagnosis not present

## 2022-08-05 DIAGNOSIS — I480 Paroxysmal atrial fibrillation: Secondary | ICD-10-CM | POA: Diagnosis not present

## 2022-08-05 DIAGNOSIS — E7849 Other hyperlipidemia: Secondary | ICD-10-CM | POA: Diagnosis not present

## 2022-08-05 DIAGNOSIS — I10 Essential (primary) hypertension: Secondary | ICD-10-CM

## 2022-08-05 MED ORDER — DIAZEPAM 5 MG PO TABS: ORAL_TABLET | ORAL | 3 refills | Status: DC

## 2022-08-05 MED ORDER — ALPRAZOLAM 0.5 MG PO TABS: ORAL_TABLET | ORAL | 3 refills | Status: DC

## 2022-08-05 NOTE — Progress Notes (Signed)
Provider:  Alain Honey, MD  Careteam: Patient Care Team: Wardell Honour, MD as PCP - General (Family Medicine) Belva Crome, MD as PCP - Cardiology (Cardiology) Vickie Epley, MD as PCP - Electrophysiology (Cardiology) Javier Glazier, MD (Inactive) as Consulting Physician (Pulmonary Disease) Danella Sensing, MD as Consulting Physician (Dermatology) Jessy Oto, MD as Consulting Physician (Orthopedic Surgery) Irine Seal, MD as Attending Physician (Urology) Jackolyn Confer, MD as Consulting Physician (General Surgery) Melrose Nakayama, MD as Consulting Physician (Cardiothoracic Surgery) Rozetta Nunnery, MD (Inactive) as Consulting Physician (Otolaryngology) Griselda Miner, MD as Consulting Physician (Dermatology)  PLACE OF SERVICE:  [place]  Advanced Directive information    Allergies  Allergen Reactions   Heparin     Blood clots    Penicillin G Shortness Of Breath   Levaquin [Levofloxacin In D5w] Other (See Comments)    Patient felt jittery and nervous with insomnia on the medication.   Aspirin     Breathing complications, can tolerate baby asa   Codeine     Some is ok   Sucralfate     Could not sleep   Zetia [Ezetimibe]     jittery   Zocor [Simvastatin]     Liver enzymes increased   Plavix [Clopidogrel Bisulfate] Palpitations    Chief Complaint  Patient presents with   Medical Management of Chronic Issues    Patient presents today for a 6 month follow-up.   Quality Metric Gaps    Zoster, COVID #7     HPI: Patient is a 86 y.o. male patient is here for medical management of chronic problems including anxiety, insomnia, hyperlipidemia, and A-fib.  He also describes some pain in his hips today.  He has been seen in the [place] for this and was told he has spinal stenosis.  He continues to walk 2 to 3 miles. Complains of nocturia is much as 6-7 times per night.  He is reluctant to take medication thinking this might cause a flare of  his A-fib.  Review of Systems:  Review of Systems  Constitutional: Negative.   HENT: Negative.    Respiratory: Negative.    Cardiovascular: Negative.   Genitourinary:  Positive for frequency.  Musculoskeletal:  Positive for joint pain.  Skin: Negative.   Neurological: Negative.   Psychiatric/Behavioral:  The patient is nervous/anxious.   All other systems reviewed and are negative.   Past Medical History:  Diagnosis Date   Allergic rhinitis due to pollen    Anginal pain ([place])    once in a while, none recent   Anxiety    Anxiety disorder    Asthma    exercise induced   Atrial fibrillation (HCC)    hx of   Basal cell carcinoma of skin of lip    Clotting disorder (HCC)    Coronary atherosclerosis of native coronary artery    with CABG 3/12  -MAZE procedure    Diastasis recti 05/07/2016   Difficulty urinating    Diverticulosis of colon (without mention of hemorrhage)    mild   Dysuria    Edema    Essential hypertension, benign    Heart attack ([place]) 3335   Helicobacter pylori (H. 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   History of anal fissures    History of cholelithiasis    Hyperlipidemia    Hypertrophy of prostate with urinary obstruction and other lower urinary tract symptoms (LUTS)    Impotence of  organic origin    Internal hemorrhoids without mention of complication    Jaundice age 23   Long term (current) use of anticoagulants    Neutropenia ([place]) 10/07/2016   Nonspecific elevation of levels of transaminase or lactic acid dehydrogenase (LDH)    Other voice and resonance disorders    after surgery, took breathing tube out and damaged something   Pneumonia    Pulmonary embolism ([place]) 2012   s/p heart surgery   Pulmonary embolism ([place])    3/12   Rheumatic fever age 26   Skin cancer    Hx of squamous cell x2   Sleep apnea    no CPAP   Unspecified constipation    Past Surgical History:  Procedure Laterality Date   CARDIAC  CATHETERIZATION     CHOLECYSTECTOMY N/A 11/18/2013   Procedure: LAPAROSCOPIC CHOLECYSTECTOMY With Bibb;  Surgeon: Odis Hollingshead, MD;  Location: [facility];  Service: General;  Laterality: N/A;   CORONARY ARTERY BYPASS GRAFT  2012   Median sternotomy, extracorporeal circulation, coronary   HERNIA REPAIR  2010   bilateral   HYDROCELE EXCISION     dr [name]    [facility] SURGERY     squamus x2; [facility] fo bridge of nose June 2017.   ROOT CANAL  12/2020   Pr patient he had complications    SCROTUM EXPLORATION  1990's   multiple   SPERMATOCELECTOMY  02/1999   Right, Dr.[name]   SUPERFICIAL SKIN CYSTECTOMY (l) HIP Right 1997   dr [name]   VASECTOMY  04/1992   Social History:   reports that he quit smoking about 44 years ago. His smoking use included cigarettes. He started smoking about 65 years ago. He has a 22.00 pack-year smoking history. He has never used smokeless tobacco. He reports that he does not currently use alcohol. He reports that he does not use drugs.  Family History  Problem Relation Age of Onset   Bone cancer Mother    Diabetes Mother    Colon cancer Neg Hx    Esophageal cancer Neg Hx    Rectal cancer Neg Hx    Stomach cancer Neg Hx    Lung disease Neg Hx     Medications: Patient's Medications  New Prescriptions   No medications on file  Previous Medications   ACETAMINOPHEN (TYLENOL) 325 MG TABLET    Take 325 mg by mouth at bedtime.   ALBUTEROL (PROVENTIL HFA;VENTOLIN HFA) 108 (90 BASE) MCG/ACT INHALER    Inhale 2 puffs into the lungs every 6 (six) hours as needed for wheezing.   ASPIRIN EC 81 MG TABLET    Take 1 tablet (81 mg total) by mouth every Monday, Wednesday, and Friday. Swallow whole.   HYDROCORTISONE (ANUSOL-HC) 2.5 % RECTAL CREAM    Place 1 application rectally 2 (two) times daily. Use pea sized amount rectally twice daily for 7 days   ROSUVASTATIN (CRESTOR) 10 MG TABLET    Take 1 tablet (10 mg total) by mouth daily.  Modified Medications   Modified Medication  Previous Medication   ALPRAZOLAM (XANAX) 0.5 MG TABLET ALPRAZolam (XANAX) 0.5 MG tablet      Take One to Two tablets by mouth once daily as needed for anxiety.    Take One to Two tablets by mouth once daily as needed for anxiety.   DIAZEPAM (VALIUM) 5 MG TABLET diazepam (VALIUM) 5 MG tablet      Take One to Two tablets by mouth at bedtime as needed for rest.  Take One to Two tablets by mouth at bedtime as needed for rest.  Discontinued Medications   No medications on file    Physical Exam:  Vitals:   08/05/22 1021  BP: (!) 150/62  Pulse: 62  Resp: 20  Temp: (!) 97.1 F (36.2 C)  SpO2: 98%  Weight: 188 lb (85.3 kg)  Height: '5\' 9"'$  (1.753 m)   Body mass index is 27.76 kg/m. Wt Readings from Last 3 Encounters:  08/05/22 188 lb (85.3 kg)  02/07/22 188 lb (85.3 kg)  02/04/22 183 lb 6.4 oz (83.2 kg)    Physical Exam Vitals and nursing note reviewed.  Constitutional:      Appearance: Normal appearance.  Cardiovascular:     Rate and Rhythm: Normal rate and regular rhythm.     Pulses: Normal pulses.  Pulmonary:     Effort: Pulmonary effort is normal.     Breath sounds: Normal breath sounds.  Neurological:     General: No focal deficit present.     Mental Status: He is alert and oriented to person, place, and time.  Psychiatric:        Mood and Affect: Mood normal.        Behavior: Behavior normal.     Labs reviewed: Basic Metabolic Panel: Recent Labs    02/04/22 1111  NA 139  K 4.7  CL 101  CO2 31  GLUCOSE 98  BUN 10  CREATININE 0.91  CALCIUM 9.4   Liver Function Tests: Recent Labs    02/04/22 1111  AST 22  ALT 15  BILITOT 1.4*  PROT 7.2   No results for input(s): "LIPASE", "AMYLASE" in the last 8760 hours. No results for input(s): "AMMONIA" in the last 8760 hours. CBC: No results for input(s): "WBC", "NEUTROABS", "HGB", "HCT", "MCV", "PLT" in the last 8760 hours. Lipid Panel: Recent Labs    02/04/22 1111  CHOL 160  HDL 60  LDLCALC 84  TRIG  73  CHOLHDL 2.7   TSH: No results for input(s): "TSH" in the last 8760 hours. A1C: Lab Results  Component Value Date   HGBA1C 5.9 (H) 01/28/2021     Assessment/Plan  1. Insomnia, unspecified type Uses both alprazolam and diazepam 1 for anxiety and 1 for sleep  2. Anxiety state Uses diazepam for anxiety.  Take sparingly  3. Paroxysmal atrial fibrillation (HCC) Patient continues in sinus rhythm on no medications to control rate or anticoagulant  4. Essential hypertension Blood pressure little higher today than normal.  Repeat check is 140/70  5. Other hyperlipidemia Continues to take rosuvastatin 10 mg lipids last assessed 6 months ago with LDL of [place], MD [place] [phone]

## 2022-11-11 ENCOUNTER — Telehealth: Payer: Self-pay | Admitting: Interventional Cardiology

## 2022-11-11 NOTE — Telephone Encounter (Signed)
Patient would like to get a recommendation on who would be a good cardiologist for him to see.

## 2022-11-14 NOTE — Telephone Encounter (Signed)
Alexander Crome, MD  You39 minutes ago (12:50 PM)   [name], or [employer] patient back with Dr. Tamala Julian suggestions. Patient decided to got with Dr. Marlou Porch. Made patient an appointment for January. Patient verbalized understanding.

## 2022-11-25 ENCOUNTER — Encounter: Payer: Self-pay | Admitting: Interventional Cardiology

## 2022-11-25 ENCOUNTER — Ambulatory Visit: Payer: Medicare Other | Attending: Cardiology | Admitting: Interventional Cardiology

## 2022-11-25 VITALS — BP 158/60 | HR 67 | Ht 69.0 in | Wt 187.0 lb

## 2022-11-25 DIAGNOSIS — I25810 Atherosclerosis of coronary artery bypass graft(s) without angina pectoris: Secondary | ICD-10-CM

## 2022-11-25 DIAGNOSIS — I4891 Unspecified atrial fibrillation: Secondary | ICD-10-CM | POA: Diagnosis not present

## 2022-11-25 DIAGNOSIS — I10 Essential (primary) hypertension: Secondary | ICD-10-CM

## 2022-11-25 DIAGNOSIS — G4733 Obstructive sleep apnea (adult) (pediatric): Secondary | ICD-10-CM | POA: Diagnosis not present

## 2022-11-25 DIAGNOSIS — E7849 Other hyperlipidemia: Secondary | ICD-10-CM | POA: Diagnosis not present

## 2022-11-25 DIAGNOSIS — R0609 Other forms of dyspnea: Secondary | ICD-10-CM

## 2022-11-25 NOTE — Patient Instructions (Signed)
Medication Instructions:  Your physician recommends that you continue on your current medications as directed. Please refer to the Current Medication list given to you today.  *If you need a refill on your cardiac medications before your next appointment, please call your pharmacy*  Follow-Up: As needed  Important Information About Sugar

## 2022-11-25 NOTE — Progress Notes (Signed)
Cardiology Office Note:    Date:  11/25/2022   ID:  Alexander [name], DOB [birth year], MRN [id number]  PCP:  Wardell Honour, MD  Cardiologist:  Sinclair Grooms, MD   Referring MD: Wardell Honour, MD   Chief Complaint  Patient presents with   Coronary Artery Disease   Hypertension   Hyperlipidemia    History of Present Illness:    Alexander [name] is a 86 y.o. male with a hx of CAD with CABG (LIMA LAD; SVG Diagonal 1; SVG distal RCA)+ Maze with ligation of left atrial appendage 2012, subsequent PAF and refused anticoagulation/dofetilide, primary hypertension, hyperlipidemia, heparin induced thrombocytopenia, and possible statin and other medication intolerance(anticoagulants -ALL). ( After extensive conversation, it has been resolved in past (02/07/2022) through shared decision making that the patient will not be on anticoagulation, except antiarrhythmic therapy, and will not consider ablation as management strategies for A-fib/a flutter.  No further conversations are necessary concerning this issue.)   He receives his care at the [facility] in [place].  He had an echocardiogram done after emergency room visit for chest pain.  He had a flat mildly elevated troponin levels.  An ischemic evaluation was not performed.  The chest discomfort was deemed to be due to musculoskeletal discomfort.  He has been noticing some exertional dyspnea.  If he walks further than 2-1/2 miles he feels washed out.  As noted above he has decided against anticoagulation therapy.  At times when he feels short of breath and fatigued he notices that his heart rhythm is irregular.  Past Medical History:  Diagnosis Date   Allergic rhinitis due to pollen    Anginal pain ([place])    once in a while, none recent   Anxiety    Anxiety disorder    Asthma    exercise induced   Atrial fibrillation (HCC)    hx of   Basal cell carcinoma of skin of lip    Clotting disorder (HCC)    Coronary atherosclerosis of  native coronary artery    with CABG 3/12  -MAZE procedure    Diastasis recti 05/07/2016   Difficulty urinating    Diverticulosis of colon (without mention of hemorrhage)    mild   Dysuria    Edema    Essential hypertension, benign    Heart attack ([place]) 4158   Helicobacter pylori (H. 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[place])    3/12   History of anal fissures    History of cholelithiasis    Hyperlipidemia    Hypertrophy of prostate with urinary obstruction and other lower urinary tract symptoms (LUTS)    Impotence of organic origin    Internal hemorrhoids without mention of complication    Jaundice age 33   Long term (current) use of anticoagulants    Neutropenia ([place]) 10/07/2016   Nonspecific elevation of levels of transaminase or lactic acid dehydrogenase (LDH)    Other voice and resonance disorders    after surgery, took breathing tube out and damaged something   Pneumonia    Pulmonary embolism ([place]) 2012   s/p heart surgery   Pulmonary embolism ([place])    3/12   Rheumatic fever age 34   Skin cancer    Hx of squamous cell x2   Sleep apnea    no CPAP   Unspecified constipation     Past Surgical History:  Procedure Laterality Date   CARDIAC CATHETERIZATION     CHOLECYSTECTOMY N/A 11/18/2013  Procedure: LAPAROSCOPIC CHOLECYSTECTOMY With IOC;  Surgeon: Odis Hollingshead, MD;  Location: [facility];  Service: General;  Laterality: N/A;   CORONARY ARTERY BYPASS GRAFT  2012   Median sternotomy, extracorporeal circulation, coronary   HERNIA REPAIR  2010   bilateral   HYDROCELE EXCISION     dr [name]    [facility]     squamus x2; [facility] fo bridge of nose June 2017.   ROOT CANAL  12/2020   Pr patient he had complications    SCROTUM EXPLORATION  1990's   multiple   SPERMATOCELECTOMY  02/1999   Right, Dr.[name]   SUPERFICIAL SKIN CYSTECTOMY (l) HIP Right 1997   dr [name]   VASECTOMY  04/1992    Current Medications: Current Meds  Medication Sig   acetaminophen  (TYLENOL) 325 MG tablet Take 325 mg by mouth at bedtime.   albuterol (PROVENTIL HFA;VENTOLIN HFA) 108 (90 BASE) MCG/ACT inhaler Inhale 2 puffs into the lungs every 6 (six) hours as needed for wheezing.   ALPRAZolam (XANAX) 0.5 MG tablet Take One to Two tablets by mouth once daily as needed for anxiety.   aspirin EC 81 MG tablet Take 1 tablet (81 mg total) by mouth every Monday, Wednesday, and Friday. Swallow whole.   diazepam (VALIUM) 5 MG tablet Take One to Two tablets by mouth at bedtime as needed for rest.   hydrocortisone (ANUSOL-HC) 2.5 % rectal cream Place 1 application rectally 2 (two) times daily. Use pea sized amount rectally twice daily for 7 days   nitroGLYCERIN (NITROSTAT) 0.4 MG SL tablet Place 0.4 mg under the tongue every 5 (five) minutes as needed.   rosuvastatin (CRESTOR) 10 MG tablet Take 1 tablet (10 mg total) by mouth daily.     Allergies:   Heparin, Penicillin g, Levaquin [levofloxacin in d5w], Aspirin, Codeine, Sucralfate, Zetia [ezetimibe], Zocor [simvastatin], and Plavix [clopidogrel bisulfate]   Social History   Socioeconomic History   Marital status: Married    Spouse name: Not on file   Number of children: 1   Years of education: Not on file   Highest education level: Not on file  Occupational History   Occupation: Presenter, broadcasting: [employer]  Tobacco Use   Smoking status: Former    Packs/day: 1.00    Years: 22.00    Total pack years: 22.00    Types: Cigarettes    Start date: 08/22/1956    Quit date: 12/08/1977    Years since quitting: 44.9   Smokeless tobacco: Never   Tobacco comments:    significant second-hand smoke exposure at work  Scientific laboratory technician Use: Never used  Substance and Sexual Activity   Alcohol use: Not Currently    Comment: former heavy alcohol use, rare use   Drug use: No   Sexual activity: Yes    Partners: Female  Other Topics Concern   Not on file  Social History Narrative   Daily caffeine        [name]   [name]   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 No pets currently. No hot tub or bird exposure.    Married wife Cristie Hem  Social Determinants of Health   Financial Resource Strain: Low Risk  (11/20/2017)   Overall Financial Resource Strain (CARDIA)    Difficulty of Paying Living Expenses: Not hard at all  Food Insecurity: No Food Insecurity (11/20/2017)   Hunger Vital Sign    Worried About Running Out of Food in the Last Year: Never true    Ran Out of Food in the Last Year: Never true  Transportation Needs: No Transportation Needs (11/20/2017)   PRAPARE - Hydrologist (Medical): No    Lack of Transportation (Non-Medical): No  Physical Activity: Unknown (11/26/2018)   Exercise Vital Sign    Days of Exercise per Week: 6 days    Minutes of Exercise per Session: Not on file  Stress: Stress Concern Present (11/20/2017)   [place]    Feeling of Stress : To some extent  Social Connections: Moderately Integrated (11/20/2017)   Social Connection and Isolation Panel [NHANES]    Frequency of Communication with Friends and Family: More than three times a week    Frequency of Social Gatherings with Friends and Family: More than three times a week    Attends Religious Services: Never    Marine scientist or Organizations: Yes    Attends Archivist Meetings: 1 to 4 times per year    Marital Status: Married     Family History: The patient's family history  includes Bone cancer in his mother; Diabetes in his mother. There is no history of Colon cancer, Esophageal cancer, Rectal cancer, Stomach cancer, or Lung disease.  ROS:   Please see the history of present illness.    Some anxiety about his health.  All other systems reviewed and are negative.  EKGs/Labs/Other Studies Reviewed:    The following studies were reviewed today: The echocardiogram demonstrated no LVH, indeterminate diastolic function, trivial tricuspid regurgitation.  EKG:  EKG normal sinus rhythm, first-degree AV block with PR 252 ms, and in comparison to March 2023, no significant changes noted  Recent Labs: 02/04/2022: ALT 15; BUN 10; Creat 0.91; Potassium 4.7; Sodium 139  Recent Lipid Panel    Component Value Date/Time   CHOL 160 02/04/2022 1111   CHOL 168 05/02/2016 0826   TRIG 73 02/04/2022 1111   HDL 60 02/04/2022 1111   HDL 56 05/02/2016 0826   CHOLHDL 2.7 02/04/2022 1111   VLDL 17 10/07/2016 0950   LDLCALC 84 02/04/2022 1111    Physical Exam:    VS:  BP (!) 158/60 (BP Location: Right Arm, Patient Position: Sitting, Cuff Size: Normal)   Pulse 67   Ht '5\' 9"'$  (1.753 m)   Wt 187 lb (84.8 kg)   SpO2 96%   BMI 27.62 kg/m     Wt Readings from Last 3 Encounters:  11/25/22 187 lb (84.8 kg)  08/05/22 188 lb (85.3 kg)  02/07/22 188 lb (85.3 kg)     GEN: Healthy appearing. No acute distress HEENT: Normal NECK: No JVD. LYMPHATICS: No lymphadenopathy CARDIAC: No murmur. RRR no gallop, or edema. VASCULAR:  Normal Pulses. No bruits. RESPIRATORY:  Clear to auscultation without rales, wheezing or rhonchi  ABDOMEN: Soft, non-tender, non-distended, No pulsatile mass, MUSCULOSKELETAL: No deformity  SKIN: Warm and dry NEUROLOGIC:  Alert and oriented x 3 PSYCHIATRIC:  Normal affect   ASSESSMENT:    1. Dyspnea on exertion   2. Essential hypertension   3. Coronary artery disease involving coronary bypass graft of native  heart without angina pectoris   4. Other  hyperlipidemia   5. Obstructive sleep apnea   6. Atrial fibrillation, unspecified type ([place])    PLAN:    In order of problems listed above:  Unclear etiology.  Possibly deconditioning related.  Unfortunately, diastolic function on recent echo was indeterminate.  At 42, he is still walking up to 2 miles at a time without chest discomfort or limitation.  Watchful waiting.  Dyspnea could be related to poorly controlled blood pressure. Blood pressure is above the target of 140/80.  This likely needs therapy but he is antimedication.  I did not twist his arm as this never works relative to recommendations concerning risk factor modification. Continue rosuvastatin therapy Same We did not discuss sleep apnea Not treated with anticoagulation therapy.  Possibly related to the episodes of fatigue and dyspnea noted in #1.  Clinical observation.  Prefers getting his care at the [facility] in [place].  As needed follow-up here if problems.   Medication Adjustments/Labs and Tests Ordered: Current medicines are reviewed at length with the patient today.  Concerns regarding medicines are outlined above.  Orders Placed This Encounter  Procedures   EKG 12-Lead   No orders of the defined types were placed in this encounter.   Patient Instructions  Medication Instructions:  Your physician recommends that you continue on your current medications as directed. Please refer to the Current Medication list given to you today.  *If you need a refill on your cardiac medications before your next appointment, please call your pharmacy*  Follow-Up: As needed  Important Information About Sugar         Signed, Sinclair Grooms, MD  11/25/2022 10:46 AM    [place]

## 2022-12-16 DIAGNOSIS — L720 Epidermal cyst: Secondary | ICD-10-CM | POA: Diagnosis not present

## 2022-12-16 DIAGNOSIS — L821 Other seborrheic keratosis: Secondary | ICD-10-CM | POA: Diagnosis not present

## 2022-12-16 DIAGNOSIS — L570 Actinic keratosis: Secondary | ICD-10-CM | POA: Diagnosis not present

## 2022-12-16 DIAGNOSIS — D485 Neoplasm of uncertain behavior of skin: Secondary | ICD-10-CM | POA: Diagnosis not present

## 2022-12-16 DIAGNOSIS — D225 Melanocytic nevi of trunk: Secondary | ICD-10-CM | POA: Diagnosis not present

## 2022-12-16 DIAGNOSIS — Z85828 Personal history of other malignant neoplasm of skin: Secondary | ICD-10-CM | POA: Diagnosis not present

## 2022-12-17 ENCOUNTER — Ambulatory Visit: Payer: Medicare Other | Admitting: Cardiology

## 2023-02-11 ENCOUNTER — Ambulatory Visit (INDEPENDENT_AMBULATORY_CARE_PROVIDER_SITE_OTHER): Payer: Medicare Other | Admitting: Family Medicine

## 2023-02-11 ENCOUNTER — Encounter: Payer: Self-pay | Admitting: Family Medicine

## 2023-02-11 VITALS — BP 118/72 | HR 56 | Temp 97.8°F | Ht 69.0 in | Wt 188.4 lb

## 2023-02-11 DIAGNOSIS — G4700 Insomnia, unspecified: Secondary | ICD-10-CM | POA: Diagnosis not present

## 2023-02-11 DIAGNOSIS — E7849 Other hyperlipidemia: Secondary | ICD-10-CM

## 2023-02-11 DIAGNOSIS — I10 Essential (primary) hypertension: Secondary | ICD-10-CM | POA: Diagnosis not present

## 2023-02-11 DIAGNOSIS — F411 Generalized anxiety disorder: Secondary | ICD-10-CM | POA: Diagnosis not present

## 2023-02-11 DIAGNOSIS — R350 Frequency of micturition: Secondary | ICD-10-CM

## 2023-02-11 DIAGNOSIS — N401 Enlarged prostate with lower urinary tract symptoms: Secondary | ICD-10-CM | POA: Diagnosis not present

## 2023-02-11 DIAGNOSIS — I480 Paroxysmal atrial fibrillation: Secondary | ICD-10-CM | POA: Diagnosis not present

## 2023-02-11 MED ORDER — DIAZEPAM 5 MG PO TABS: ORAL_TABLET | ORAL | 3 refills | Status: DC

## 2023-02-11 MED ORDER — ALPRAZOLAM 0.5 MG PO TABS: ORAL_TABLET | ORAL | 3 refills | Status: DC

## 2023-02-11 NOTE — Progress Notes (Signed)
Provider:  Alain Honey, MD  Careteam: Patient Care Team: Wardell Honour, MD as PCP - General (Family Medicine) Belva Crome, MD (Inactive) as PCP - Cardiology (Cardiology) Vickie Epley, MD as PCP - Electrophysiology (Cardiology) Javier Glazier, MD (Inactive) as Consulting Physician (Pulmonary Disease) Danella Sensing, MD as Consulting Physician (Dermatology) Jessy Oto, MD (Inactive) as Consulting Physician (Orthopedic Surgery) Irine Seal, MD as Attending Physician (Urology) Jackolyn Confer, MD as Consulting Physician (General Surgery) Melrose Nakayama, MD as Consulting Physician (Cardiothoracic Surgery) Rozetta Nunnery, MD (Inactive) as Consulting Physician (Otolaryngology) Griselda Miner, MD as Consulting Physician (Dermatology)  PLACE OF SERVICE:  [place]  Advanced Directive information    Allergies  Allergen Reactions   Heparin     Blood clots    Penicillin G Shortness Of Breath   Levaquin [Levofloxacin In D5w] Other (See Comments)    Patient felt jittery and nervous with insomnia on the medication.   Aspirin     Breathing complications, can tolerate baby asa   Codeine     Some is ok   Sucralfate     Could not sleep   Zetia [Ezetimibe]     jittery   Zocor [Simvastatin]     Liver enzymes increased   Plavix [Clopidogrel Bisulfate] Palpitations    Chief Complaint  Patient presents with   Medical Management of Chronic Issues    Medical Management of Chronic Issues. 6 Month Follow up     HPI: Patient is a 87 y.o. male here for medical management of chronic problems:AF, CAD, hypertension, hyperlipidemia, and anxiety. Recent visit with cardiologist patient still declines to take any med related to A-fib including rate controlling medicine or anticoagulant.  His A-fib is truly paroxysmal. He denies chest pain although he has had some shortness of breath with walking. He continues to take benzodiazepines for his anxiety and  sleep as well as statin for his lipids  Review of Systems:  Review of Systems  Constitutional: Negative.   Respiratory: Negative.    Cardiovascular: Negative.   Musculoskeletal:  Positive for back pain.  Neurological: Negative.   Psychiatric/Behavioral:  The patient is nervous/anxious.   All other systems reviewed and are negative.   Past Medical History:  Diagnosis Date   Allergic rhinitis due to pollen    Anginal pain ([place])    once in a while, none recent   Anxiety    Anxiety disorder    Asthma    exercise induced   Atrial fibrillation (HCC)    hx of   Basal cell carcinoma of skin of lip    Clotting disorder (HCC)    Coronary atherosclerosis of native coronary artery    with CABG 3/12  -MAZE procedure    Diastasis recti 05/07/2016   Difficulty urinating    Diverticulosis of colon (without mention of hemorrhage)    mild   Dysuria    Edema    Essential hypertension, benign    Heart attack ([place]) 0000000   Helicobacter pylori (H. 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[place])    3/12   History of anal fissures    History of cholelithiasis    Hyperlipidemia    Hypertrophy of prostate with urinary obstruction and other lower urinary tract symptoms (LUTS)    Impotence of organic origin    Internal hemorrhoids without mention of complication    Jaundice age 60   Long term (current) use of anticoagulants    Neutropenia ([place]) 10/07/2016  Nonspecific elevation of levels of transaminase or lactic acid dehydrogenase (LDH)    Other voice and resonance disorders    after surgery, took breathing tube out and damaged something   Pneumonia    Pulmonary embolism ([place]) 2012   s/p heart surgery   Pulmonary embolism (HCC)    3/12   Rheumatic fever age 72   Skin cancer    Hx of squamous cell x2   Sleep apnea    no CPAP   Unspecified constipation    Past Surgical History:  Procedure Laterality Date   CARDIAC CATHETERIZATION     CHOLECYSTECTOMY N/A 11/18/2013   Procedure:  LAPAROSCOPIC CHOLECYSTECTOMY With [place];  Surgeon: Odis Hollingshead, MD;  Location: [facility];  Service: General;  Laterality: N/A;   CORONARY ARTERY BYPASS GRAFT  2012   Median sternotomy, extracorporeal circulation, coronary   HERNIA REPAIR  2010   bilateral   HYDROCELE EXCISION     dr [name]    [facility]     squamus x2; [facility] fo bridge of nose June 2017.   ROOT CANAL  12/2020   Pr patient he had complications    SCROTUM EXPLORATION  1990's   multiple   SPERMATOCELECTOMY  02/1999   Right, Dr.[name]   SUPERFICIAL SKIN CYSTECTOMY (l) HIP Right 1997   dr [name]   VASECTOMY  04/1992   Social History:   reports that he quit smoking about 45 years ago. His smoking use included cigarettes. He started smoking about 66 years ago. He has a 22.00 pack-year smoking history. He has never used smokeless tobacco. He reports that he does not currently use alcohol. He reports that he does not use drugs.  Family History  Problem Relation Age of Onset   Bone cancer Mother    Diabetes Mother    Colon cancer Neg Hx    Esophageal cancer Neg Hx    Rectal cancer Neg Hx    Stomach cancer Neg Hx    Lung disease Neg Hx     Medications: Patient's Medications  New Prescriptions   No medications on file  Previous Medications   ACETAMINOPHEN (TYLENOL) 325 MG TABLET    Take 325 mg by mouth at bedtime.   ALBUTEROL (PROVENTIL HFA;VENTOLIN HFA) 108 (90 BASE) MCG/ACT INHALER    Inhale 2 puffs into the lungs every 6 (six) hours as needed for wheezing.   ALPRAZOLAM (XANAX) 0.5 MG TABLET    Take One to Two tablets by mouth once daily as needed for anxiety.   ASPIRIN EC 81 MG TABLET    Take 1 tablet (81 mg total) by mouth every Monday, Wednesday, and Friday. Swallow whole.   DIAZEPAM (VALIUM) 5 MG TABLET    Take One to Two tablets by mouth at bedtime as needed for rest.   HYDROCORTISONE (ANUSOL-HC) 2.5 % RECTAL CREAM    Place 1 application rectally 2 (two) times daily. Use pea sized amount rectally twice daily for 7  days   NITROGLYCERIN (NITROSTAT) 0.4 MG SL TABLET    Place 0.4 mg under the tongue every 5 (five) minutes as needed.   ROSUVASTATIN (CRESTOR) 10 MG TABLET    Take 1 tablet (10 mg total) by mouth daily.  Modified Medications   No medications on file  Discontinued Medications   No medications on file    Physical Exam:  There were no vitals filed for this visit. There is no height or weight on file to calculate BMI. Wt Readings from Last 3 Encounters:  11/25/22 187 lb (84.8 kg)  08/05/22 188 lb (85.3 kg)  02/07/22 188 lb (85.3 kg)    Physical Exam Vitals and nursing note reviewed.  Constitutional:      Appearance: Normal appearance.  Neck:     Vascular: No carotid bruit.  Cardiovascular:     Rate and Rhythm: Normal rate and regular rhythm.  Pulmonary:     Effort: Pulmonary effort is normal.     Breath sounds: Normal breath sounds.  Musculoskeletal:        General: Normal range of motion.  Neurological:     General: No focal deficit present.     Mental Status: He is alert and oriented to person, place, and time.  Psychiatric:        Mood and Affect: Mood normal.        Behavior: Behavior normal.     Labs reviewed: Basic Metabolic Panel: No results for input(s): "NA", "K", "CL", "CO2", "GLUCOSE", "BUN", "CREATININE", "CALCIUM", "MG", "PHOS", "TSH" in the last 8760 hours. Liver Function Tests: No results for input(s): "AST", "ALT", "ALKPHOS", "BILITOT", "PROT", "ALBUMIN" in the last 8760 hours. No results for input(s): "LIPASE", "AMYLASE" in the last 8760 hours. No results for input(s): "AMMONIA" in the last 8760 hours. CBC: No results for input(s): "WBC", "NEUTROABS", "HGB", "HCT", "MCV", "PLT" in the last 8760 hours. Lipid Panel: No results for input(s): "CHOL", "HDL", "LDLCALC", "TRIG", "CHOLHDL", "LDLDIRECT" in the last 8760 hours. TSH: No results for input(s): "TSH" in the last 8760 hours. A1C: Lab Results  Component Value Date   HGBA1C 5.9 (H) 01/28/2021      Assessment/Plan  1. Anxiety state Continues with alprazolam and diazepam  2. Insomnia, unspecified type Continues with diazepam and alprazolam  3. Other hyperlipidemia Takes rosuvastatin 10 mg.  Last lipid check 1 year ago was good  4. Benign prostatic hyperplasia with urinary frequency Patient requesting PSA today with frequency and nocturia  5. Paroxysmal atrial fibrillation (HCC) Seems to be in sinus rhythm.  Continues to decline rate controlling medicine or anticoagulant  6. Essential hypertension  Blood pressure good at 118/72 off any antihypertensive Alain Honey, MD [place] [phone]

## 2023-02-13 LAB — COMPREHENSIVE METABOLIC PANEL
AG Ratio: 1.7 (calc) (ref 1.0–2.5)
ALT: 20 U/L (ref 9–46)
AST: 25 U/L (ref 10–35)
Albumin: 4.4 g/dL (ref 3.6–5.1)
Alkaline phosphatase (APISO): 42 U/L (ref 35–144)
BUN: 14 mg/dL (ref 7–25)
CO2: 32 mmol/L (ref 20–32)
Calcium: 9.6 mg/dL (ref 8.6–10.3)
Chloride: 102 mmol/L (ref 98–110)
Creat: 0.98 mg/dL (ref 0.70–1.22)
Globulin: 2.6 g/dL (calc) (ref 1.9–3.7)
Glucose, Bld: 100 mg/dL — ABNORMAL HIGH (ref 65–99)
Potassium: 5 mmol/L (ref 3.5–5.3)
Sodium: 141 mmol/L (ref 135–146)
Total Bilirubin: 1.6 mg/dL — ABNORMAL HIGH (ref 0.2–1.2)
Total Protein: 7 g/dL (ref 6.1–8.1)

## 2023-02-13 LAB — LIPID PANEL
Cholesterol: 154 mg/dL (ref <200)
HDL: 58 mg/dL (ref >=40)
LDL Cholesterol (Calc): 82 mg/dL (calc)
Non-HDL Cholesterol (Calc): 96 mg/dL (calc) (ref <130)
Total CHOL/HDL Ratio: 2.7 (calc) (ref <5.0)
Triglycerides: 65 mg/dL (ref <150)

## 2023-02-13 LAB — PSA, TOTAL AND FREE
PSA, % Free: 19 % (calc) — ABNORMAL LOW (ref >25)
PSA, Free: 0.6 ng/mL
PSA, Total: 3.1 ng/mL (ref <=4.0)

## 2023-02-19 ENCOUNTER — Encounter: Payer: Self-pay | Admitting: Nurse Practitioner

## 2023-02-19 ENCOUNTER — Telehealth: Payer: Self-pay | Admitting: *Deleted

## 2023-02-19 ENCOUNTER — Encounter: Payer: Medicare Other | Admitting: Nurse Practitioner

## 2023-02-19 NOTE — Progress Notes (Signed)
   This service is provided via telemedicine  No vital signs collected/recorded due to the encounter was a telemedicine visit.   Location of patient (ex: home, work):  Home  Patient consents to a telephone visit:  Yes  Location of the provider (ex: office, home):  [facility]  Name of any referring provider:  na  Names of all persons participating in the telemedicine service and their role in the encounter:  Victorino December, Audi Conover, CMA, Dani Gobble  Time spent on call:  8:42

## 2023-02-19 NOTE — Telephone Encounter (Signed)
[name], castles are scheduled for a virtual visit with your provider today.    Just as we do with appointments in the office, we must obtain your consent to participate.  Your consent will be active for this visit and any virtual visit you [name] have with one of our providers in the next 365 days.    If you have a MyChart account, I can also send a copy of this consent to you electronically.  All virtual visits are billed to your insurance company just like a traditional visit in the office.  As this is a virtual visit, video technology does not allow for your provider to perform a traditional examination.  This Mandolin Falwell limit your provider's ability to fully assess your condition.  If your provider identifies any concerns that need to be evaluated in person or the need to arrange testing such as labs, EKG, etc, we will make arrangements to do so.    Although advances in technology are sophisticated, we cannot ensure that it will always work on either your end or our end.  If the connection with a video visit is poor, we Eliah Ozawa have to switch to a telephone visit.  With either a video or telephone visit, we are not always able to ensure that we have a secure connection.   I need to obtain your verbal consent now.   Are you willing to proceed with your visit today?   Alexander [name] has provided verbal consent on 02/19/2023 for a virtual visit (video or telephone).   [name], [place] 02/19/2023  10:19 AM

## 2023-02-20 ENCOUNTER — Ambulatory Visit (INDEPENDENT_AMBULATORY_CARE_PROVIDER_SITE_OTHER): Payer: Medicare Other | Admitting: Nurse Practitioner

## 2023-02-20 ENCOUNTER — Encounter: Payer: Self-pay | Admitting: Nurse Practitioner

## 2023-02-20 VITALS — BP 128/70 | HR 72 | Temp 97.1°F | Ht 70.0 in | Wt 188.0 lb

## 2023-02-20 DIAGNOSIS — Z0000 Encounter for general adult medical examination without abnormal findings: Secondary | ICD-10-CM

## 2023-02-20 NOTE — Progress Notes (Signed)
Subjective:   Alexander [name] is a 87 y.o. male who presents for Medicare Annual/Subsequent preventive examination.  Review of Systems     Cardiac Risk Factors include: advanced age (>58men, >87 women);dyslipidemia;hypertension;male gender     Objective:    Today's Vitals   02/20/23 0900 02/20/23 0914  BP: 128/70   Pulse: 72   Temp: (!) 97.1 F (36.2 C)   TempSrc: Temporal   SpO2: 98%   Weight: 188 lb (85.3 kg)   Height: 5\' 10"  (1.778 m)   PainSc:  2    Body mass index is 26.98 kg/m.     02/20/2023    8:46 AM 02/19/2023   10:08 AM 02/11/2023   10:25 AM 02/13/2022   11:23 AM 10/02/2021    2:55 PM 07/12/2021    1:22 PM 07/10/2021   10:59 AM  Advanced Directives  Does Patient Have a Medical Advance Directive? Yes Yes Yes Yes No No No  Type of Paramedic of [place];Living will [place];Living will [place];Out of facility DNR (pink MOST or yellow form);Living will [place]     Does patient want to make changes to medical advance directive? No - Patient declined No - Patient declined No - Patient declined No - Patient declined     Copy of [place] in Chart? No - copy requested No - copy requested No - copy requested No - copy requested     Would patient like information on creating a medical advance directive?     No - Patient declined  No - Patient declined    Current Medications (verified) Outpatient Encounter Medications as of 02/20/2023  Medication Sig   acetaminophen (TYLENOL) 325 MG tablet Take 325 mg by mouth at bedtime.   albuterol (PROVENTIL HFA;VENTOLIN HFA) 108 (90 BASE) MCG/ACT inhaler Inhale 2 puffs into the lungs every 6 (six) hours as needed for wheezing.   ALPRAZolam (XANAX) 0.5 MG tablet Take One to Two tablets by mouth once daily as needed for anxiety.   aspirin EC 81 MG tablet Take 1 tablet (81 mg total) by mouth every Monday, Wednesday, and Friday. Swallow  whole.   diazepam (VALIUM) 5 MG tablet Take One to Two tablets by mouth at bedtime as needed for rest.   hydrocortisone (ANUSOL-HC) 2.5 % rectal cream Place 1 application rectally 2 (two) times daily. Use pea sized amount rectally twice daily for 7 days   nitroGLYCERIN (NITROSTAT) 0.4 MG SL tablet Place 0.4 mg under the tongue every 5 (five) minutes as needed.   rosuvastatin (CRESTOR) 10 MG tablet Take 1 tablet (10 mg total) by mouth daily.   No facility-administered encounter medications on file as of 02/20/2023.    Allergies (verified) Heparin, Penicillin g, Levaquin [levofloxacin in d5w], Aspirin, Codeine, Sucralfate, Zetia [ezetimibe], Zocor [simvastatin], and Plavix [clopidogrel bisulfate]   History: Past Medical History:  Diagnosis Date   Allergic rhinitis due to pollen    Anginal pain ([place])    once in a while, none recent   Anxiety    Anxiety disorder    Asthma    exercise induced   Atrial fibrillation (HCC)    hx of   Basal cell carcinoma of skin of lip    Clotting disorder (HCC)    Coronary atherosclerosis of native coronary artery    with CABG 3/12  -MAZE procedure    Diastasis recti 05/07/2016   Difficulty urinating    Diverticulosis of  colon (without mention of hemorrhage)    mild   Dysuria    Edema    Essential hypertension, benign    Heart attack ([place]) 0000000   Helicobacter pylori (H. 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HCC)    3/12   History of anal fissures    History of cholelithiasis    Hyperlipidemia    Hypertrophy of prostate with urinary obstruction and other lower urinary tract symptoms (LUTS)    Impotence of organic origin    Internal hemorrhoids without mention of complication    Jaundice age 52   Long term (current) use of anticoagulants    Neutropenia ([place]) 10/07/2016   Nonspecific elevation of levels of transaminase or lactic acid dehydrogenase (LDH)    Other voice and resonance disorders    after surgery, took breathing tube out and  damaged something   Pneumonia    Pulmonary embolism ([place]) 2012   s/p heart surgery   Pulmonary embolism ([place])    3/12   Rheumatic fever age 25   Skin cancer    Hx of squamous cell x2   Sleep apnea    no CPAP   Unspecified constipation    Past Surgical History:  Procedure Laterality Date   CARDIAC CATHETERIZATION     CHOLECYSTECTOMY N/A 11/18/2013   Procedure: LAPAROSCOPIC CHOLECYSTECTOMY With [place];  Surgeon: Odis Hollingshead, MD;  Location: [facility];  Service: General;  Laterality: N/A;   CORONARY ARTERY BYPASS GRAFT  2012   Median sternotomy, extracorporeal circulation, coronary   HERNIA REPAIR  2010   bilateral   HYDROCELE EXCISION     dr [name]    [facility] SURGERY     squamus x2; [facility] fo bridge of nose June 2017.   ROOT CANAL  12/2020   Pr patient he had complications    SCROTUM EXPLORATION  1990's   multiple   SPERMATOCELECTOMY  02/1999   Right, Dr.[name]   SUPERFICIAL SKIN CYSTECTOMY (l) HIP Right 1997   dr [name]   VASECTOMY  04/1992   Family History  Problem Relation Age of Onset   Bone cancer Mother    Diabetes Mother    Colon cancer Neg Hx    Esophageal cancer Neg Hx    Rectal cancer Neg Hx    Stomach cancer Neg Hx    Lung disease Neg Hx    Social History   Socioeconomic History   Marital status: Married    Spouse name: Not on file   Number of children: 1   Years of education: Not on file   Highest education level: Not on file  Occupational History   Occupation: Presenter, broadcasting: [employer]  Tobacco Use   Smoking status: Former    Packs/day: 1.00    Years: 22.00    Additional pack years: 0.00    Total pack years: 22.00    Types: Cigarettes    Start date: 08/22/1956    Quit date: 12/08/1977    Years since quitting: 45.2   Smokeless tobacco: Never   Tobacco comments:    significant second-hand smoke exposure at work  Scientific laboratory technician Use: Never used  Substance and Sexual Activity   Alcohol use: Not Currently    Comment: former  heavy alcohol use, rare use   Drug use: No   Sexual activity: Yes    Partners: Female  Other Topics Concern   Not on file  Social History Narrative   Daily caffeine  [name]   [name]   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 No pets currently. No hot tub or bird exposure.    Married wife Social research officer, government   Social Determinants of Health   Financial Resource Strain: Low Risk  (11/20/2017)   Overall Financial Resource Strain (CARDIA)    Difficulty of Paying Living Expenses: Not hard at all  Food Insecurity: No Food Insecurity (11/20/2017)   Hunger Vital Sign    Worried About Running Out of Food in the Last Year: Never true    Ran Out of Food in the Last Year: Never true  Transportation Needs: No Transportation Needs (11/20/2017)   PRAPARE - Hydrologist (Medical): No    Lack of Transportation (Non-Medical): No  Physical Activity: Unknown (11/26/2018)   Exercise Vital Sign    Days of Exercise per Week: 6 days    Minutes of Exercise per Session: Not on file  Stress: Stress Concern Present (11/20/2017)   [place]    Feeling of Stress : To some extent  Social Connections: Moderately Integrated (11/20/2017)   Social Connection and Isolation Panel [NHANES]    Frequency of Communication with Friends and Family: More than three times a week    Frequency of Social Gatherings with Friends and Family: More than three times a week    Attends Religious Services: Never    Museum/gallery conservator or Organizations: Yes    Attends Music therapist: 1 to 4 times per year    Marital Status: Married    Tobacco Counseling Counseling given: Not Answered Tobacco comments: significant second-hand smoke exposure at work   Clinical Intake:  Pre-visit preparation completed: Yes  Pain : 0-10 Pain Score: 2  Pain Type: Chronic pain Pain Location: Hip Pain Orientation: Left Pain Onset: More than a month ago Pain Frequency: Intermittent     BMI - recorded: 26.98 Nutritional Status: BMI 25 -29 Overweight Nutritional Risks: None Diabetes: No  How often do you need to have someone help you when you read instructions, pamphlets, or other written materials from your doctor or pharmacy?: 1 - Never  Diabetic?no         Activities of Daily Living    02/20/2023    9:11 AM  In your present state of health, do you have any difficulty performing the following activities:  Hearing? 1  Vision? 0  Difficulty concentrating or making decisions? 0  Walking or climbing stairs? 0  Dressing or bathing? 0  Doing errands, shopping? 0  Preparing Food and eating ? N  Using the Toilet? N  In the past six months, have you accidently leaked urine? Y  Comment rarely  Do you have problems with loss of bowel control? N  Managing your Medications? N  Managing your Finances? N  Housekeeping or managing your Housekeeping? N    Patient Care Team: Wardell Honour, MD as PCP - General (Family Medicine) Belva Crome, MD (Inactive) as PCP - Cardiology (Cardiology) Vickie Epley, MD as PCP - Electrophysiology (Cardiology) Javier Glazier, MD (Inactive) as Consulting Physician (Pulmonary Disease) Ronnald Ramp,  Quillian Quince, MD as Consulting Physician (Dermatology) Jessy Oto, MD (Inactive) as Consulting Physician (Orthopedic Surgery) Irine Seal, MD as Attending Physician (Urology) Jackolyn Confer, MD as Consulting Physician (General Surgery) Melrose Nakayama, MD as  Consulting Physician (Cardiothoracic Surgery) Rozetta Nunnery, MD (Inactive) as Consulting Physician (Otolaryngology) Griselda Miner, MD as Consulting Physician (Dermatology)  Indicate any recent Medical Services you may have received from other than [facility] providers in the past year (date may be approximate).     Assessment:   This is a routine wellness examination for Alexander [name].  Hearing/Vision screen Hearing Screening - Comments:: Patient wears hearing aids to assist with hearing issues  Vision Screening - Comments:: Last eye exam less than 12 months ago with Dr.[facility], Herbie [place] (06/03/2022)  Dietary issues and exercise activities discussed: Current Exercise Habits: Home exercise routine, Type of exercise: walking;strength training/weights;calisthenics, Time (Minutes): 50, Frequency (Times/Week): 5, Weekly Exercise (Minutes/Week): 250   Goals Addressed   None    Depression Screen    02/20/2023    8:45 AM 02/19/2023   10:11 AM 02/11/2023   10:27 AM 02/13/2022   11:22 AM 02/12/2021    8:50 AM 01/31/2021   10:43 AM 07/30/2020   11:21 AM  PHQ 2/9 Scores  PHQ - 2 Score 0 0 0 0 0 0 0    Fall Risk    02/20/2023    8:45 AM 02/19/2023   10:11 AM 02/11/2023   10:27 AM 08/05/2022   10:29 AM 02/13/2022   11:23 AM  [place] in the past year? 0 0 0 0 0  Number falls in past yr: 0 0 0 0 0  Injury with Fall? 0 0 0 0 0  Risk for fall due to : No Fall Risks   No Fall Risks No Fall Risks  Follow up Falls evaluation completed    Falls evaluation completed    FALL RISK PREVENTION PERTAINING TO THE HOME:  Any stairs in or around the home? Yes  If so, are there any without handrails? No  Home free of loose throw rugs in walkways, pet beds, electrical cords, etc? Yes  Adequate lighting in your home to reduce risk of falls? Yes   ASSISTIVE DEVICES UTILIZED TO PREVENT FALLS:  Life alert? Yes  Use of a cane, walker or w/c? No  Grab bars in the bathroom? No  Shower chair or bench in  shower? No  Elevated toilet seat or a handicapped toilet? No   TIMED UP AND GO:  Was the test performed? No .   Cognitive Function:    11/26/2018   11:35 AM 11/20/2017   10:17 AM 10/07/2016    2:33 PM 11/06/2015    3:48 PM 10/18/2014    3:04 PM  MMSE - Mini Mental State Exam  Orientation to time 5 5 5 5 5   Orientation to Place 5 5 5 5 5   Registration 3 3 3 3 3   Attention/ Calculation 5 5 5 5 5   Recall 3 2 3 3 2   Language- name 2 objects 2 2 2 2 2   Language- repeat 1 1 1 1 1   Language- follow 3 step command 3 3 3 2 3   Language- read & follow direction 1 1 1 1 1   Write a sentence 1 1 1 1 1   Copy design 1 1 1 1 1   Total score 30 29 30 29 29         02/19/2023   10:11 AM 02/13/2022   11:24  AM 02/12/2021    8:53 AM 11/29/2019    1:26 PM  6CIT Screen  What Year? 0 points 0 points 0 points 0 points  What month? 0 points 0 points 0 points 0 points  What time? 0 points 0 points 0 points 0 points  Count back from 20 0 points 0 points 0 points 0 points  Months in reverse 0 points 0 points 0 points 2 points  Repeat phrase 0 points 0 points 0 points 0 points  Total Score 0 points 0 points 0 points 2 points    Immunizations Immunization History  Administered Date(s) Administered   COVID-19, mRNA, vaccine(Comirnaty)12 years and older 10/14/2022   H1N1 10/08/2008   Influenza Split 10/13/2013, 10/02/2014   Influenza, High Dose Seasonal PF 09/26/2015   Influenza,inj,Quad PF,6+ Mos 10/07/2016, 08/30/2018, 09/06/2019, 10/22/2020   Influenza-Unspecified 10/16/2006, 08/08/2008, 10/08/2009, 11/15/2012, 10/02/2014, 09/08/2015, 10/02/2016, 09/07/2018, 10/15/2020, 03/07/2022   PFIZER Comirnaty(Gray Top)Covid-19 Tri-Sucrose Vaccine 06/19/2021   PFIZER(Purple Top)SARS-COV-2 Vaccination 01/03/2020, 01/14/2020, 02/04/2020, 09/27/2020   Pfizer Covid-19 Vaccine Bivalent Booster 83yrs & up 10/24/2021, 12/09/2022   Pneumococcal Conjugate-13 10/16/2010, 10/02/2014   Pneumococcal Polysaccharide-23  03/20/2006   Td 12/15/2007   Tdap 04/07/2008, 10/01/2019   Tetanus 12/09/1995    TDAP status: Up to date  Flu Vaccine status: Declined, Education has been provided regarding the importance of this vaccine but patient still declined. Advised may receive this vaccine at local pharmacy or Health Dept. Aware to provide a copy of the vaccination record if obtained from local pharmacy or Health Dept. Verbalized acceptance and understanding.  Pneumococcal vaccine status: Up to date  Covid-19 vaccine status: Information provided on how to obtain vaccines.   Qualifies for Shingles Vaccine? Yes   Zostavax completed No   Shingrix Completed?: Yes  Screening Tests Health Maintenance  Topic Date Due   Zoster Vaccines- Shingrix (1 of 2) Never done   COVID-19 Vaccine (9 - 2023-24 season) 02/03/2023   INFLUENZA VACCINE  03/08/2023 (Originally 07/08/2022)   Medicare Annual Wellness (AWV)  02/20/2024   DTaP/Tdap/Td (4 - Td or Tdap) 09/30/2029   Pneumonia Vaccine 88+ Years old  Completed   HPV VACCINES  Aged Out    Health Maintenance  Health Maintenance Due  Topic Date Due   Zoster Vaccines- Shingrix (1 of 2) Never done   COVID-19 Vaccine (9 - 2023-24 season) 02/03/2023    Colorectal cancer screening: No longer required.   Lung Cancer Screening: (Low Dose CT Chest recommended if Age 43-80 years, 30 pack-year currently smoking OR have quit w/in 15years.) does not qualify.   Lung Cancer Screening Referral: na  Additional Screening:  Hepatitis C Screening: does not qualify; Completed na  Vision Screening: Recommended annual ophthalmology exams for early detection of glaucoma and other disorders of the eye. Is the patient up to date with their annual eye exam?  Yes  Who is the provider or what is the name of the office in which the patient attends annual eye exams? [facility] If pt is not established with a provider, would they like to be referred to a provider to establish care? No .   Dental  Screening: Recommended annual dental exams for proper oral hygiene  Community Resource Referral / Chronic Care Management: CRR required this visit?  No   CCM required this visit?  No      Plan:     I have personally reviewed and noted the following in the patient's chart:   Medical and social history Use of alcohol, tobacco  or illicit drugs  Current medications and supplements including opioid prescriptions. Patient is not currently taking opioid prescriptions. Functional ability and status Nutritional status Physical activity Advanced directives List of other physicians Hospitalizations, surgeries, and ER visits in previous 12 months Vitals Screenings to include cognitive, depression, and falls Referrals and appointments  In addition, I have reviewed and discussed with patient certain preventive protocols, quality metrics, and best practice recommendations. A written personalized care plan for preventive services as well as general preventive health recommendations were provided to patient.     Lauree Chandler, NP   02/20/2023   Place of service: [facility]

## 2023-02-20 NOTE — Patient Instructions (Signed)
Alexander [name] , Thank you for taking time to come for your Medicare Wellness Visit. I appreciate your ongoing commitment to your health goals. Please review the following plan we discussed and let me know if I can assist you in the future.   Screening recommendations/referrals: Colonoscopy aged out Recommended yearly ophthalmology/optometry visit for glaucoma screening and checkup Recommended yearly dental visit for hygiene and checkup  Vaccinations: Influenza vaccine due annually in September/October Pneumococcal vaccine up to date Tdap vaccine up to date Shingles vaccine DUE- recommend to get at your local pharmacy     Advanced directives: please bring copy to office to place on file.   Conditions/risks identified: advance age.   Next appointment: yearly for AWV  Preventive Care 43 Years and Older, Male Preventive care refers to lifestyle choices and visits with your health care provider that can promote health and wellness. What does preventive care include? A yearly physical exam. This is also called an annual well check. Dental exams once or twice a year. Routine eye exams. Ask your health care provider how often you should have your eyes checked. Personal lifestyle choices, including: Daily care of your teeth and gums. Regular physical activity. Eating a healthy diet. Avoiding tobacco and drug use. Limiting alcohol use. Practicing safe sex. Taking low doses of aspirin every day. Taking vitamin and mineral supplements as recommended by your health care provider. What happens during an annual well check? The services and screenings done by your health care provider during your annual well check will depend on your age, overall health, lifestyle risk factors, and family history of disease. Counseling  Your health care provider may ask you questions about your: Alcohol use. Tobacco use. Drug use. Emotional well-being. Home and relationship well-being. Sexual  activity. Eating habits. History of falls. Memory and ability to understand (cognition). Work and work Statistician. Screening  You may have the following tests or measurements: Height, weight, and BMI. Blood pressure. Lipid and cholesterol levels. These may be checked every 5 years, or more frequently if you are over 49 years old. Skin check. Lung cancer screening. You may have this screening every year starting at age 95 if you have a 30-pack-year history of smoking and currently smoke or have quit within the past 15 years. Fecal occult blood test (FOBT) of the stool. You may have this test every year starting at age 42. Flexible sigmoidoscopy or colonoscopy. You may have a sigmoidoscopy every 5 years or a colonoscopy every 10 years starting at age 8. Prostate cancer screening. Recommendations will vary depending on your family history and other risks. Hepatitis C blood test. Hepatitis B blood test. Sexually transmitted disease (STD) testing. Diabetes screening. This is done by checking your blood sugar (glucose) after you have not eaten for a while (fasting). You may have this done every 1-3 years. Abdominal aortic aneurysm (AAA) screening. You may need this if you are a current or former smoker. Osteoporosis. You may be screened starting at age 79 if you are at high risk. Talk with your health care provider about your test results, treatment options, and if necessary, the need for more tests. Vaccines  Your health care provider may recommend certain vaccines, such as: Influenza vaccine. This is recommended every year. Tetanus, diphtheria, and acellular pertussis (Tdap, Td) vaccine. You may need a Td booster every 10 years. Zoster vaccine. You may need this after age 100. Pneumococcal 13-valent conjugate (PCV13) vaccine. One dose is recommended after age 90. Pneumococcal polysaccharide (PPSV23) vaccine. One dose  is recommended after age 103. Talk to your health care provider about which  screenings and vaccines you need and how often you need them. This information is not intended to replace advice given to you by your health care provider. Make sure you discuss any questions you have with your health care provider. Document Released: 12/21/2015 Document Revised: 08/13/2016 Document Reviewed: 09/25/2015 Elsevier Interactive Patient Education  2017 [place] Prevention in the Home Falls can cause injuries. They can happen to people of all ages. There are many things you can do to make your home safe and to help prevent falls. What can I do on the outside of my home? Regularly fix the edges of walkways and driveways and fix any cracks. Remove anything that might make you trip as you walk through a door, such as a raised step or threshold. Trim any bushes or trees on the path to your home. Use bright outdoor lighting. Clear any walking paths of anything that might make someone trip, such as rocks or tools. Regularly check to see if handrails are loose or broken. Make sure that both sides of any steps have handrails. Any raised decks and porches should have guardrails on the edges. Have any leaves, snow, or ice cleared regularly. Use sand or salt on walking paths during winter. Clean up any spills in your garage right away. This includes oil or grease spills. What can I do in the bathroom? Use night lights. Install grab bars by the toilet and in the tub and shower. Do not use towel bars as grab bars. Use non-skid mats or decals in the tub or shower. If you need to sit down in the shower, use a plastic, non-slip stool. Keep the floor dry. Clean up any water that spills on the floor as soon as it happens. Remove soap buildup in the tub or shower regularly. Attach bath mats securely with double-sided non-slip rug tape. Do not have throw rugs and other things on the floor that can make you trip. What can I do in the bedroom? Use night lights. Make sure that you have a  light by your bed that is easy to reach. Do not use any sheets or blankets that are too big for your bed. They should not hang down onto the floor. Have a firm chair that has side arms. You can use this for support while you get dressed. Do not have throw rugs and other things on the floor that can make you trip. What can I do in the kitchen? Clean up any spills right away. Avoid walking on wet floors. Keep items that you use a lot in easy-to-reach places. If you need to reach something above you, use a strong step stool that has a grab bar. Keep electrical cords out of the way. Do not use floor polish or wax that makes floors slippery. If you must use wax, use non-skid floor wax. Do not have throw rugs and other things on the floor that can make you trip. What can I do with my stairs? Do not leave any items on the stairs. Make sure that there are handrails on both sides of the stairs and use them. Fix handrails that are broken or loose. Make sure that handrails are as long as the stairways. Check any carpeting to make sure that it is firmly attached to the stairs. Fix any carpet that is loose or worn. Avoid having throw rugs at the top or bottom of the stairs.  If you do have throw rugs, attach them to the floor with carpet tape. Make sure that you have a light switch at the top of the stairs and the bottom of the stairs. If you do not have them, ask someone to add them for you. What else can I do to help prevent falls? Wear shoes that: Do not have high heels. Have rubber bottoms. Are comfortable and fit you well. Are closed at the toe. Do not wear sandals. If you use a stepladder: Make sure that it is fully opened. Do not climb a closed stepladder. Make sure that both sides of the stepladder are locked into place. Ask someone to hold it for you, if possible. Clearly mark and make sure that you can see: Any grab bars or handrails. First and last steps. Where the edge of each step  is. Use tools that help you move around (mobility aids) if they are needed. These include: Canes. Walkers. Scooters. Crutches. Turn on the lights when you go into a dark area. Replace any light bulbs as soon as they burn out. Set up your furniture so you have a clear path. Avoid moving your furniture around. If any of your floors are uneven, fix them. If there are any pets around you, be aware of where they are. Review your medicines with your doctor. Some medicines can make you feel dizzy. This can increase your chance of falling. Ask your doctor what other things that you can do to help prevent falls. This information is not intended to replace advice given to you by your health care provider. Make sure you discuss any questions you have with your health care provider. Document Released: 09/20/2009 Document Revised: 05/01/2016 Document Reviewed: 12/29/2014 Elsevier Interactive Patient Education  2017 [employer].

## 2023-02-23 ENCOUNTER — Encounter: Payer: Medicare Other | Admitting: Nurse Practitioner

## 2023-03-16 ENCOUNTER — Encounter: Payer: Self-pay | Admitting: Cardiology

## 2023-03-16 ENCOUNTER — Ambulatory Visit: Payer: Medicare Other | Attending: Cardiology | Admitting: Cardiology

## 2023-03-16 VITALS — BP 122/66 | HR 67 | Ht 70.0 in | Wt 188.6 lb

## 2023-03-16 DIAGNOSIS — R0609 Other forms of dyspnea: Secondary | ICD-10-CM

## 2023-03-16 DIAGNOSIS — I480 Paroxysmal atrial fibrillation: Secondary | ICD-10-CM | POA: Diagnosis not present

## 2023-03-16 DIAGNOSIS — I25810 Atherosclerosis of coronary artery bypass graft(s) without angina pectoris: Secondary | ICD-10-CM | POA: Diagnosis not present

## 2023-03-16 NOTE — Progress Notes (Signed)
Cardiology Office Note:    Date:  03/16/2023   ID:  Alexander [name], DOB [birth year], MRN [id number]  PCP:  No primary care provider on file.   [name] Providers Cardiologist:  Alexander Schultz, MD Electrophysiologist:  Alexander Prude, MD     Referring MD: Alexander Kuster, MD    History of Present Illness:    Alexander [name] is a 87 y.o. male CAD with CABG (LIMA LAD; SVG Diagonal 1; SVG distal RCA)+ Maze with ligation of left atrial appendage 2012 [name], long recovery 30 days, subsequent PAF and refused anticoagulation/dofetilide, primary hypertension, hyperlipidemia, heparin induced thrombocytopenia, and possible statin and other medication intolerance(anticoagulants -ALL). ( After extensive conversation, it has been resolved in past (02/07/2022) through shared decision making that the patient will not be on anticoagulation, except antiarrhythmic therapy, and will not consider ablation as management strategies for A-fib/a flutter.  No further conversations are necessary concerning this issue.)     [facility] care at [name] [place].  He had an echocardiogram done after emergency room visit for chest pain.  He had a flat mildly elevated troponin levels.  An ischemic evaluation was not performed.  The chest discomfort was deemed to be due to musculoskeletal discomfort.   He has been noticing some exertional dyspnea.  If he walks further than 2-1/2 miles he feels washed out.  As noted above he has decided against anticoagulation therapy.  At times when he feels short of breath and fatigued he notices that his heart rhythm is irregular. Auto sales for 40 years. SOB with exercise for many years (was told ashema at one point).  AFIB 1.5 year ago. Prior Eliquis and Xarelto caused some breathing issues.  None since.  Takes 3 SA. [facility] - EF 55-60% PASP 36 mmHg.  Was in the [employer].  Stationed at [employer] [place] in [place] at times.  2 and half  years.  Past Medical History:  Diagnosis Date   Allergic rhinitis due to pollen    Anginal pain    once in a while, none recent   Anxiety    Anxiety disorder    Asthma    exercise induced   Atrial fibrillation    hx of   Basal cell carcinoma of skin of lip    Clotting disorder    Coronary atherosclerosis of native coronary artery    with CABG 3/12  -MAZE procedure    Diastasis recti 05/07/2016   Difficulty urinating    Diverticulosis of colon (without mention of hemorrhage)    mild   Dysuria    Edema    Essential hypertension, benign    Heart attack 2012   Helicobacter pylori (H. 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   History of anal fissures    History of cholelithiasis    Hyperlipidemia    Hypertrophy of prostate with urinary obstruction and other lower urinary tract symptoms (LUTS)    Impotence of organic origin    Internal hemorrhoids without mention of complication    Jaundice age 41   Long term (current) use of anticoagulants    Neutropenia 10/07/2016   Nonspecific elevation of levels of transaminase or lactic acid dehydrogenase (LDH)    Other voice and resonance disorders    after surgery, took breathing tube out and damaged something   Pneumonia    Pulmonary embolism 2012   s/p heart surgery   Pulmonary embolism  3/12   Rheumatic fever age 75   Skin cancer    Hx of squamous cell x2   Sleep apnea    no CPAP   Unspecified constipation     Past Surgical History:  Procedure Laterality Date   CARDIAC CATHETERIZATION     CHOLECYSTECTOMY N/A 11/18/2013   Procedure: LAPAROSCOPIC CHOLECYSTECTOMY With IOC;  Surgeon: Alexander Pollack, MD;  Location: [facility];  Service: General;  Laterality: N/A;   CORONARY ARTERY BYPASS GRAFT  2012   Median sternotomy, extracorporeal circulation, coronary   HERNIA REPAIR  2010   bilateral   HYDROCELE EXCISION     [name]    [name] SURGERY     squamus x2; [facility] fo bridge of nose June 2017.   ROOT CANAL   12/2020   Pr patient he had complications    SCROTUM EXPLORATION  1990's   multiple   SPERMATOCELECTOMY  02/1999   Right, [name]   SUPERFICIAL SKIN CYSTECTOMY (l) HIP Right 1997   [name]   VASECTOMY  04/1992    Current Medications: Current Meds  Medication Sig   acetaminophen (TYLENOL) 325 MG tablet Take 325 mg by mouth at bedtime.   albuterol (PROVENTIL HFA;VENTOLIN HFA) 108 (90 BASE) MCG/ACT inhaler Inhale 2 puffs into the lungs every 6 (six) hours as needed for wheezing.   ALPRAZolam (XANAX) 0.5 MG tablet Take One to Two tablets by mouth once daily as needed for anxiety.   aspirin EC 81 MG tablet Take 1 tablet (81 mg total) by mouth every Monday, Wednesday, and Friday. Swallow whole.   diazepam (VALIUM) 5 MG tablet Take One to Two tablets by mouth at bedtime as needed for rest.   hydrocortisone (ANUSOL-HC) 2.5 % rectal cream Place 1 application rectally 2 (two) times daily. Use pea sized amount rectally twice daily for 7 days   nitroGLYCERIN (NITROSTAT) 0.4 MG SL tablet Place 0.4 mg under the tongue every 5 (five) minutes as needed.   rosuvastatin (CRESTOR) 10 MG tablet Take 1 tablet (10 mg total) by mouth daily.     Allergies:   Heparin, Penicillin g, Levaquin [levofloxacin in d5w], Aspirin, Codeine, Sucralfate, Zetia [ezetimibe], Zocor [simvastatin], and Plavix [clopidogrel bisulfate]   Social History   Socioeconomic History   Marital status: Married    Spouse name: Not on file   Number of children: 1   Years of education: Not on file   Highest education level: Not on file  Occupational History   Occupation: Museum/gallery conservator: [employer]  Tobacco Use   Smoking status: Former    Packs/day: 1.00    Years: 22.00    Additional pack years: 0.00    Total pack years: 22.00    Types: Cigarettes    Start date: 08/22/1956    Quit date: 12/08/1977    Years since quitting: 45.2   Smokeless tobacco: Never   Tobacco comments:    significant second-hand smoke  exposure at work  Building services engineer Use: Never used  Substance and Sexual Activity   Alcohol use: Not Currently    Comment: former heavy alcohol use, rare use   Drug use: No   Sexual activity: Yes    Partners: Female  Other Topics Concern   Not on file  Social History Narrative   Daily caffeine       AlexanderNitka/AlexanderCollins- Orthopedics   AlexanderW.Stevens- Pulmonologist   Alexander. Salena Saner Young-Pulmonologist   AlexanderBrodie-GI   AlexanderSharma-Allergy    AlexanderH.Smith Cardiologist  AlexanderWrenn-Urologist   AlexanderSimonds-Pulmonologist   AlexanderRosenbower-General Surgeon   AlexanderStephen [name]-Cardiac Surgeon   AlexanderGranfortuna-Hematologist    AlexanderRamaswamy-Pulmonary   AlexanderChris Newman-ENT       [facility]:   Reports he previously worked in [employer] sales with significant second-hand smoked exposure. No pets currently. No hot tub or bird exposure.    Married wife Scientific laboratory technicianAlex   Social Determinants of Health   Financial Resource Strain: Low Risk  (11/20/2017)   Overall Financial Resource Strain (CARDIA)    Difficulty of Paying Living Expenses: Not hard at all  Food Insecurity: No Food Insecurity (11/20/2017)   Hunger Vital Sign    Worried About Running Out of Food in the Last Year: Never true    Ran Out of Food in the Last Year: Never true  Transportation Needs: No Transportation Needs (11/20/2017)   PRAPARE - Administrator, Civil ServiceTransportation    Lack of Transportation (Medical): No    Lack of Transportation (Non-Medical): No  Physical Activity: Unknown (11/26/2018)   Exercise Vital Sign    Days of Exercise per Week: 6 days    Minutes of Exercise per Session: Not on file  Stress: Stress Concern Present (11/20/2017)   [employer] - Occupational Stress Questionnaire    Feeling of Stress : To some extent  Social Connections: Moderately Integrated (11/20/2017)   Social Connection and Isolation Panel [NHANES]    Frequency of Communication with Friends and Family: More than three times a week     Frequency of Social Gatherings with Friends and Family: More than three times a week    Attends Religious Services: Never    Database administratorActive Member of Clubs or Organizations: Yes    Attends BankerClub or Organization Meetings: 1 to 4 times per year    Marital Status: Married     Family History: The patient's family history includes Bone cancer in his mother; Diabetes in his mother. There is no history of Colon cancer, Esophageal cancer, Rectal cancer, Stomach cancer, or Lung disease.  ROS:   Please see the history of present illness.     All other systems reviewed and are negative.  EKGs/Labs/Other Studies Reviewed:    The following studies were reviewed today: Cardiac Studies & Procedures         MONITORS  CARDIAC EVENT MONITOR 08/27/2021  Narrative  Sinus rhythm and sinus bradycardia predominate  Atrial fibrillation with RVR and controlled rate. 3% AF burden.  Poor correlation between symptoms and arrhythmia.            EKG:  normal sinus rhythm, first-degree AV block with PR 252 ms, and in comparison to March 2023, no significant changes noted   Recent Labs: 02/11/2023: ALT 20; BUN 14; Creat 0.98; Potassium 5.0; Sodium 141  Recent Lipid Panel    Component Value Date/Time   CHOL 154 02/11/2023 1122   CHOL 168 05/02/2016 0826   TRIG 65 02/11/2023 1122   HDL 58 02/11/2023 1122   HDL 56 05/02/2016 0826   CHOLHDL 2.7 02/11/2023 1122   VLDL 17 10/07/2016 0950   LDLCALC 82 02/11/2023 1122                  Physical Exam:    VS:  BP 122/66   Pulse 67   Ht 5\' 10"  (1.778 m)   Wt 188 lb 9.6 oz (85.5 kg)   SpO2 98%   BMI 27.06 kg/m     Wt Readings from Last 3 Encounters:  03/16/23 188 lb 9.6 oz (85.5  kg)  02/20/23 188 lb (85.3 kg)  02/11/23 188 lb 6.4 oz (85.5 kg)     GEN:  Well nourished, well developed in no acute distress HEENT: Normal NECK: No JVD; No carotid bruits LYMPHATICS: No lymphadenopathy CARDIAC: RRR, no murmurs, rubs, gallops RESPIRATORY:  Clear to  auscultation without rales, wheezing or rhonchi  ABDOMEN: Soft, non-tender, non-distended MUSCULOSKELETAL:  No edema; No deformity  SKIN: Warm and dry NEUROLOGIC:  Alert and oriented x 3 PSYCHIATRIC:  Normal affect   ASSESSMENT:    1. Dyspnea on exertion   2. Coronary artery disease involving coronary bypass graft of native heart without angina pectoris   3. PAF (paroxysmal atrial fibrillation)   4. Coronary artery disease involving autologous vein coronary bypass graft without angina pectoris    PLAN:    In order of problems listed above:  Coronary artery disease status post CABG - 2012.  Doing well.  [name].  No anginal symptoms.  Continue with goal-directed medical therapy.  Aspirin statin.  Paroxysmal atrial fibrillation - Status post Maze procedure.  Left atrial appendage ligation.  Takes an additional aspirin.  Bruising noted on forearms.  Has not had an episode in over a year.  Hyperlipidemia - Continue with Crestor 10 mg a day.  Prior LDL 82.  Overall goal less than 70.  Shortness of breath - Longstanding.  Continue to monitor.  Takes good care of himself.          Medication Adjustments/Labs and Tests Ordered: Current medicines are reviewed at length with the patient today.  Concerns regarding medicines are outlined above.  No orders of the defined types were placed in this encounter.  No orders of the defined types were placed in this encounter.   Patient Instructions  Medication Instructions:  Your physician recommends that you continue on your current medications as directed. Please refer to the Current Medication list given to you today.  *If you need a refill on your cardiac medications before your next appointment, please call your pharmacy*   Follow-Up: At [facility], you and your health needs are our priority.  As part of our continuing mission to provide you with exceptional heart care, we have created designated Provider Care Teams.   These Care Teams include your primary Cardiologist (physician) and Advanced Practice Providers (APPs -  Physician Assistants and Nurse Practitioners) who all work together to provide you with the care you need, when you need it.  We recommend signing up for the patient portal called "MyChart".  Sign up information is provided on this After Visit Summary.  MyChart is used to connect with patients for Virtual Visits (Telemedicine).  Patients are able to view lab/test results, encounter notes, upcoming appointments, etc.  Non-urgent messages can be sent to your provider as well.   To learn more about what you can do with MyChart, go to [email or web address].    Your next appointment:   1 year(s)  Provider:   Donato Schultz, MD   Signed, Alexander Schultz, MD  03/16/2023 11:28 AM    [name]

## 2023-03-16 NOTE — Patient Instructions (Addendum)
Medication Instructions:   Your physician recommends that you continue on your current medications as directed. Please refer to the Current Medication list given to you today.  *If you need a refill on your cardiac medications before your next appointment, please call your pharmacy*    Follow-Up: At [facility], you and your health needs are our priority.  As part of our continuing mission to provide you with exceptional heart care, we have created designated Provider Care Teams.  These Care Teams include your primary Cardiologist (physician) and Advanced Practice Providers (APPs -  Physician Assistants and Nurse Practitioners) who all work together to provide you with the care you need, when you need it.  We recommend signing up for the patient portal called "MyChart".  Sign up information is provided on this After Visit Summary.  MyChart is used to connect with patients for Virtual Visits (Telemedicine).  Patients are able to view lab/test results, encounter notes, upcoming appointments, etc.  Non-urgent messages can be sent to your provider as well.   To learn more about what you can do with MyChart, go to [email or web address].    Your next appointment:   1 year(s)  Provider:   Mark Skains, MD      

## 2023-03-26 DIAGNOSIS — L929 Granulomatous disorder of the skin and subcutaneous tissue, unspecified: Secondary | ICD-10-CM | POA: Diagnosis not present

## 2023-03-26 DIAGNOSIS — D485 Neoplasm of uncertain behavior of skin: Secondary | ICD-10-CM | POA: Diagnosis not present

## 2023-05-15 ENCOUNTER — Encounter: Payer: Self-pay | Admitting: Family

## 2023-05-15 ENCOUNTER — Ambulatory Visit (INDEPENDENT_AMBULATORY_CARE_PROVIDER_SITE_OTHER): Payer: Medicare Other | Admitting: Family

## 2023-05-15 VITALS — BP 120/70 | HR 61 | Temp 95.8°F | Resp 20 | Ht 70.0 in | Wt 188.5 lb

## 2023-05-15 DIAGNOSIS — G8929 Other chronic pain: Secondary | ICD-10-CM

## 2023-05-15 DIAGNOSIS — I480 Paroxysmal atrial fibrillation: Secondary | ICD-10-CM

## 2023-05-15 DIAGNOSIS — M25552 Pain in left hip: Secondary | ICD-10-CM

## 2023-05-15 NOTE — Patient Instructions (Signed)
Please make an appointment with Cardiologist as soon as possible.

## 2023-05-15 NOTE — Progress Notes (Signed)
Provider: Richarda Blade FNP-C  No primary care provider on file.  Patient Care Team: Lanier Prude, MD as PCP - Electrophysiology (Cardiology) Jake Bathe, MD as PCP - Cardiology (Cardiology) Roslynn Amble, MD (Inactive) as Consulting Physician (Pulmonary Disease) Arminda Resides, MD as Consulting Physician (Dermatology) Kerrin Champagne, MD (Inactive) as Consulting Physician (Orthopedic Surgery) Bjorn Pippin, MD as Attending Physician (Urology) Avel Peace, MD as Consulting Physician (General Surgery) Loreli Slot, MD as Consulting Physician (Cardiothoracic Surgery) Drema Halon, MD (Inactive) as Consulting Physician (Otolaryngology) Mathews Robinsons, MD as Consulting Physician (Dermatology)  Extended Emergency Contact Information Primary Emergency Contact: Fugate,[name] Address: [address]          [place], [place] [address] [place] Home Phone: [phone] Mobile Phone: [phone] Relation: Spouse  Code Status:  Full Code  Goals of care: Advanced Directive information    02/20/2023    8:46 AM  Advanced Directives  Does Patient Have a Medical Advance Directive? Yes  Type of Estate agent of [place];Living will  Does patient want to make changes to medical advance directive? No - Patient declined  Copy of Healthcare Power of Attorney in Chart? No - copy requested     Chief Complaint  Patient presents with   Medical Management of Chronic Issues    Patient presents today for follow-up    HPI:  Pt is a 87 y.o. male seen today for an acute visit for evaluation of irregular heart beat.  States was working around the house which worsen his irregular heart beat.states was unable to do anything after that.Had shortness of breath. States symptoms comes and goes.His HR went  up to 140 b/min.states current no palpitation.He denies any chest tightness,chest pain or wheezing.  Also  complains of left hip pain that is worsening.No radiation to leg,numbness,tingling or weakness.would like referral to Orthopedic Dr.Murphy Wyline Mood.   Past Medical History:  Diagnosis Date   Allergic rhinitis due to pollen    Anginal pain (HCC)    once in a while, none recent   Anxiety    Anxiety disorder    Asthma    exercise induced   Atrial fibrillation (HCC)    hx of   Basal cell carcinoma of skin of lip    Clotting disorder (HCC)    Coronary atherosclerosis of native coronary artery    with CABG 3/12  -MAZE procedure    Diastasis recti 05/07/2016   Difficulty urinating    Diverticulosis of colon (without mention of hemorrhage)    mild   Dysuria    Edema    Essential hypertension, benign    Heart attack (HCC) 2012   Helicobacter pylori (H. 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HCC)    3/12   History of anal fissures    History of cholelithiasis    Hyperlipidemia    Hypertrophy of prostate with urinary obstruction and other lower urinary tract symptoms (LUTS)    Impotence of organic origin    Internal hemorrhoids without mention of complication    Jaundice age 57   Long term (current) use of anticoagulants    Neutropenia (HCC) 10/07/2016   Nonspecific elevation of levels of transaminase or lactic acid dehydrogenase (LDH)    Other voice and resonance disorders    after surgery, took breathing tube out and damaged something   Pneumonia    Pulmonary embolism (HCC) 2012   s/p heart surgery   Pulmonary embolism (HCC)  3/12   Rheumatic fever age 60   Skin cancer    Hx of squamous cell x2   Sleep apnea    no CPAP   Unspecified constipation    Past Surgical History:  Procedure Laterality Date   CARDIAC CATHETERIZATION     CHOLECYSTECTOMY N/A 11/18/2013   Procedure: LAPAROSCOPIC CHOLECYSTECTOMY With IOC;  Surgeon: Adolph Pollack, MD;  Location: [facility];  Service: General;  Laterality: N/A;   CORONARY ARTERY BYPASS GRAFT  2012   Median sternotomy,  extracorporeal circulation, coronary   HERNIA REPAIR  2010   bilateral   HYDROCELE EXCISION     dr Annabell Howells    [facility] SURGERY     squamus x2; [facility] fo bridge of nose June 2017.   ROOT CANAL  12/2020   Pr patient he had complications    SCROTUM EXPLORATION  1990's   multiple   SPERMATOCELECTOMY  02/1999   Right, Dr.[name]   SUPERFICIAL SKIN CYSTECTOMY (l) HIP Right 1997   dr Logan Bores   VASECTOMY  04/1992    Allergies  Allergen Reactions   Heparin     Blood clots    Penicillin G Shortness Of Breath   Levaquin [Levofloxacin In D5w] Other (See Comments)    Patient felt jittery and nervous with insomnia on the medication.   Aspirin     Breathing complications, can tolerate baby asa   Codeine     Some is ok   Sucralfate     Could not sleep   Zetia [Ezetimibe]     jittery   Zocor [Simvastatin]     Liver enzymes increased   Plavix [Clopidogrel Bisulfate] Palpitations    Outpatient Encounter Medications as of 05/15/2023  Medication Sig   acetaminophen (TYLENOL) 325 MG tablet Take 325 mg by mouth at bedtime.   albuterol (PROVENTIL HFA;VENTOLIN HFA) 108 (90 BASE) MCG/ACT inhaler Inhale 2 puffs into the lungs every 6 (six) hours as needed for wheezing.   ALPRAZolam (XANAX) 0.5 MG tablet Take One to Two tablets by mouth once daily as needed for anxiety.   aspirin EC 81 MG tablet Take 1 tablet (81 mg total) by mouth every Monday, Wednesday, and Friday. Swallow whole.   diazepam (VALIUM) 5 MG tablet Take One to Two tablets by mouth at bedtime as needed for rest.   hydrocortisone (ANUSOL-HC) 2.5 % rectal cream Place 1 application rectally 2 (two) times daily. Use pea sized amount rectally twice daily for 7 days   nitroGLYCERIN (NITROSTAT) 0.4 MG SL tablet Place 0.4 mg under the tongue every 5 (five) minutes as needed.   rosuvastatin (CRESTOR) 10 MG tablet Take 1 tablet (10 mg total) by mouth daily.   No facility-administered encounter medications on file as of 05/15/2023.    Review of Systems   Constitutional:  Negative for appetite change, chills, fatigue, fever and unexpected weight change.  HENT:  Negative for congestion, dental problem, ear discharge, ear pain, facial swelling, hearing loss, nosebleeds, postnasal drip, rhinorrhea, sinus pressure, sinus pain, sneezing, sore throat, tinnitus and trouble swallowing.   Eyes:  Negative for pain, discharge, redness, itching and visual disturbance.  Respiratory:  Negative for cough, chest tightness, shortness of breath and wheezing.   Cardiovascular:  Negative for chest pain, palpitations and leg swelling.       Irregular HR   Gastrointestinal:  Negative for abdominal distention, abdominal pain, blood in stool, constipation, diarrhea, nausea and vomiting.  Endocrine: Negative for cold intolerance, heat intolerance, polydipsia, polyphagia and polyuria.  Genitourinary:  Negative for difficulty urinating, dysuria, flank pain, frequency and urgency.  Musculoskeletal:  Positive for arthralgias. Negative for back pain, gait problem, joint swelling, myalgias, neck pain and neck stiffness.       Left hip pain  Skin:  Negative for color change, pallor, rash and wound.  Neurological:  Negative for dizziness, syncope, speech difficulty, weakness, light-headedness, numbness and headaches.  Hematological:  Does not bruise/bleed easily.  Psychiatric/Behavioral:  Negative for agitation, behavioral problems, confusion, hallucinations and sleep disturbance. The patient is not nervous/anxious.     Immunization History  Administered Date(s) Administered   COVID-19, mRNA, vaccine(Comirnaty)12 years and older 10/14/2022   H1N1 10/08/2008   Influenza Split 10/13/2013, 10/02/2014   Influenza, High Dose Seasonal PF 09/26/2015   Influenza,inj,Quad PF,6+ Mos 10/07/2016, 08/30/2018, 09/06/2019, 10/22/2020   Influenza-Unspecified 10/16/2006, 08/08/2008, 10/08/2009, 11/15/2012, 10/02/2014, 09/08/2015, 10/02/2016, 09/07/2018, 10/15/2020, 03/07/2022   PFIZER  Comirnaty(Gray Top)Covid-19 Tri-Sucrose Vaccine 06/19/2021   PFIZER(Purple Top)SARS-COV-2 Vaccination 01/03/2020, 01/14/2020, 02/04/2020, 09/27/2020   Pfizer Covid-19 Vaccine Bivalent Booster 12yrs & up 10/24/2021, 12/09/2022   Pneumococcal Conjugate-13 10/16/2010, 10/02/2014   Pneumococcal Polysaccharide-23 03/20/2006   Td 12/15/2007   Tdap 04/07/2008, 10/01/2019   Tetanus 12/09/1995   Pertinent  Health Maintenance Due  Topic Date Due   INFLUENZA VACCINE  07/09/2023      08/05/2022   10:29 AM 02/11/2023   10:27 AM 02/19/2023   10:11 AM 02/20/2023    8:45 AM 05/15/2023   10:52 AM  Fall Risk  Falls in the past year? 0 0 0 0 0  Was there an injury with Fall? 0 0 0 0 0  Fall Risk Category Calculator 0 0 0 0 0  Fall Risk Category (Retired) Low      (RETIRED) Patient Fall Risk Level Low fall risk      Patient at Risk for Falls Due to No Fall Risks   No Fall Risks No Fall Risks  Fall risk Follow up    Falls evaluation completed Falls evaluation completed   Functional Status Survey:    Vitals:   05/15/23 1049  BP: 120/70  Pulse: 61  Resp: 20  Temp: (!) 95.8 F (35.4 C)  SpO2: 97%  Weight: 188 lb 8 oz (85.5 kg)  Height: 5\' 10"  (1.778 m)   Body mass index is 27.05 kg/m. Physical Exam Vitals reviewed.  Constitutional:      General: He is not in acute distress.    Appearance: Normal appearance. He is overweight. He is not ill-appearing or diaphoretic.  HENT:     Head: Normocephalic.     Nose: Nose normal. No congestion or rhinorrhea.     Mouth/Throat:     Mouth: Mucous membranes are moist.     Pharynx: Oropharynx is clear. No oropharyngeal exudate or posterior oropharyngeal erythema.  Eyes:     General: No scleral icterus.       Right eye: No discharge.        Left eye: No discharge.     Conjunctiva/sclera: Conjunctivae normal.     Pupils: Pupils are equal, round, and reactive to light.  Neck:     Vascular: No carotid bruit.  Cardiovascular:     Rate and Rhythm: Normal  rate and regular rhythm.     Pulses: Normal pulses.     Heart sounds: Normal heart sounds. No murmur heard.    No friction rub. No gallop.  Pulmonary:     Effort: Pulmonary effort is normal. No respiratory distress.     Breath sounds:  Normal breath sounds. No wheezing, rhonchi or rales.  Chest:     Chest wall: No tenderness.  Abdominal:     General: Bowel sounds are normal. There is no distension.     Palpations: Abdomen is soft. There is no mass.     Tenderness: There is no abdominal tenderness. There is no right CVA tenderness, left CVA tenderness, guarding or rebound.  Musculoskeletal:        General: No swelling or tenderness. Normal range of motion.     Cervical back: Normal range of motion. No rigidity or tenderness.     Right lower leg: No edema.     Left lower leg: No edema.  Lymphadenopathy:     Cervical: No cervical adenopathy.  Skin:    General: Skin is warm and dry.     Coloration: Skin is not pale.     Findings: No bruising, erythema, lesion or rash.  Neurological:     Mental Status: He is alert and oriented to person, place, and time.     Cranial Nerves: No cranial nerve deficit.     Sensory: No sensory deficit.     Motor: No weakness.     Coordination: Coordination normal.     Gait: Gait normal.  Psychiatric:        Mood and Affect: Mood normal.        Speech: Speech normal.        Behavior: Behavior normal.     Labs reviewed: Recent Labs    02/11/23 1122  NA 141  K 5.0  CL 102  CO2 32  GLUCOSE 100*  BUN 14  CREATININE 0.98  CALCIUM 9.6   Recent Labs    02/11/23 1122  AST 25  ALT 20  BILITOT 1.6*  PROT 7.0   No results for input(s): "WBC", "NEUTROABS", "HGB", "HCT", "MCV", "PLT" in the last 8760 hours. Lab Results  Component Value Date   TSH 3.340 10/17/2014   Lab Results  Component Value Date   HGBA1C 5.9 (H) 01/28/2021   Lab Results  Component Value Date   CHOL 154 02/11/2023   HDL 58 02/11/2023   LDLCALC 82 02/11/2023   TRIG  65 02/11/2023   CHOLHDL 2.7 02/11/2023    Significant Diagnostic Results in last 30 days:  No results found.  Assessment/Plan  1. Paroxysmal atrial fibrillation (HCC) Reports recent palpitation but none today. HR controlled. - EKG 12-Lead indicates sinus bradycardia with first-degree AV block possible pulmonary disease heart rate 55 bpm.  No changes compared to EKG done 11/25/2022. -Will schedule appointment with cardiology Dr. Donato Schultz. -Advised to notify provider or go to the ED if symptoms worsen  2. Chronic left hip pain Chronic but worsening hip pain.  Requests referral to orthopedic Dr. Dewaine Conger -Continue with over-the-counter analgesics as needed - Ambulatory referral to Orthopedic Surgery  Family/ staff Communication: Reviewed plan of care with patient verbalized understanding  Labs/tests ordered: None   Next Appointment: Return if symptoms worsen or fail to improve.   Caesar Bookman, NP

## 2023-05-21 DIAGNOSIS — D0462 Carcinoma in situ of skin of left upper limb, including shoulder: Secondary | ICD-10-CM | POA: Diagnosis not present

## 2023-05-21 DIAGNOSIS — L820 Inflamed seborrheic keratosis: Secondary | ICD-10-CM | POA: Diagnosis not present

## 2023-05-21 DIAGNOSIS — L821 Other seborrheic keratosis: Secondary | ICD-10-CM | POA: Diagnosis not present

## 2023-05-21 DIAGNOSIS — D225 Melanocytic nevi of trunk: Secondary | ICD-10-CM | POA: Diagnosis not present

## 2023-05-21 DIAGNOSIS — L570 Actinic keratosis: Secondary | ICD-10-CM | POA: Diagnosis not present

## 2023-05-21 DIAGNOSIS — L720 Epidermal cyst: Secondary | ICD-10-CM | POA: Diagnosis not present

## 2023-05-21 DIAGNOSIS — Z85828 Personal history of other malignant neoplasm of skin: Secondary | ICD-10-CM | POA: Diagnosis not present

## 2023-05-27 ENCOUNTER — Telehealth: Payer: Self-pay

## 2023-05-27 NOTE — Telephone Encounter (Signed)
Patient was advised per Dr.Miller to apply cool compress and take benadryl, claritin . Also if symptoms develop to call office tomorrow,05/28/23 to schedule appointment.

## 2023-05-27 NOTE — Telephone Encounter (Signed)
Patient was cleaning his stove today and encountered a spider web. Patient was bitten on the hand by what he believes to be a brown recluse spider. Patient states it felt like an ant bite, however there is no other symptoms.  Patient cleansed the area with soap and water and then applied neosporin.   Patient denies severe pain, swelling, or redness in the area. Patient also denies abdominal cramping and pain. Patient questions if there is any additional action that should be taken.

## 2023-06-09 DIAGNOSIS — Z85828 Personal history of other malignant neoplasm of skin: Secondary | ICD-10-CM | POA: Diagnosis not present

## 2023-06-09 DIAGNOSIS — C44622 Squamous cell carcinoma of skin of right upper limb, including shoulder: Secondary | ICD-10-CM | POA: Diagnosis not present

## 2023-06-09 DIAGNOSIS — L821 Other seborrheic keratosis: Secondary | ICD-10-CM | POA: Diagnosis not present

## 2023-07-14 ENCOUNTER — Ambulatory Visit: Payer: Medicare Other | Attending: Cardiology | Admitting: Cardiology

## 2023-07-14 ENCOUNTER — Encounter: Payer: Self-pay | Admitting: Cardiology

## 2023-07-14 VITALS — BP 124/66 | HR 64 | Ht 70.0 in | Wt 188.9 lb

## 2023-07-14 DIAGNOSIS — I25810 Atherosclerosis of coronary artery bypass graft(s) without angina pectoris: Secondary | ICD-10-CM | POA: Diagnosis not present

## 2023-07-14 DIAGNOSIS — I480 Paroxysmal atrial fibrillation: Secondary | ICD-10-CM

## 2023-07-14 DIAGNOSIS — E7849 Other hyperlipidemia: Secondary | ICD-10-CM

## 2023-07-14 NOTE — Patient Instructions (Signed)

## 2023-07-14 NOTE — Progress Notes (Signed)
  Cardiology Office Note:  .   Date:  07/14/2023  ID:  Alexander [name], DOB [birth year], MRN [id number] PCP: Frederica Kuster, MD  [facility] Providers Cardiologist:  Donato Schultz, MD Electrophysiologist:  Lanier Prude, MD     History of Present Illness: .   Alexander [name] is a 87 y.o. male former patient of Dr. Sherilyn Cooter [name]'s here for follow-up CAD.  CABG 2012 Dr. Lane Hacker to LAD SVG to diagonal SVG to distal RCA, maze with ligation of LAA.  Lengthy recovery over 30 days.  Paroxysmal atrial fibrillation refusing anticoagulation, ablation or dofetilide at the time.  Dr. Lalla Brothers.  Extensive shared decision making.  Patient understands risks.  No further conversations.  Had some more episodes with AFIB, HR 55 at times.  Currently in sinus rhythm.  States that he started atrial fibrillation in his 10s.  He is to go to the hospital for observation overnight.  Avid walker.  Avoiding caffeine. ETOH.   [facility] as well.  Was in the [employer], stationed at [place] for 2-1/2 years. [employer] for 40 years   ROS: A few aches and pains.  No bleeding.  No chest pain.  Studies Reviewed: Marland Kitchen        Event monitor 08/2021-atrial fibrillation 3%.  Echocardiogram 11/2022 at the [facility] EF 60% PASP 36 mmHg   Risk Assessment/Calculations:    CHA2DS2-VASc Score = 4   This indicates a 4.8% annual risk of stroke. The patient's score is based upon: CHF History: 0 HTN History: 1 Diabetes History: 0 Stroke History: 0 Vascular Disease History: 1 Age Score: 2 Gender Score: 0            Physical Exam:   VS:  BP 124/66   Pulse 64   Ht 5\' 10"  (1.778 m)   Wt 188 lb 14.4 oz (85.7 kg)   SpO2 95%   BMI 27.10 kg/m    Wt Readings from Last 3 Encounters:  07/14/23 188 lb 14.4 oz (85.7 kg)  05/15/23 188 lb 8 oz (85.5 kg)  03/16/23 188 lb 9.6 oz (85.5 kg)    GEN: Well nourished, well developed in no acute distress NECK: No JVD; No carotid bruits CARDIAC: CABG  scar RRR, no murmurs, rubs, gallops RESPIRATORY:  Clear to auscultation without rales, wheezing or rhonchi  ABDOMEN: Soft, non-tender, non-distended EXTREMITIES:  No edema; No deformity   ASSESSMENT AND PLAN: .    CAD post CABG 2012 - No active anginal symptoms.  Continuing with goal-directed medical therapy which includes aspirin, statin.  Doing well.  Maintaining activity.  Walking.  Paroxysmal atrial fibrillation - Post maze procedure during CABG in 2012.  LAA ligation as well which assist with protection against thrombosis.  Does not wish to be on anticoagulation.  Takes an additional aspirin.  Prior event monitor showed only 3% A-fib burden.  He may have an occasional episode.  Overall doing well today.  Hyperlipidemia - Crestor 10 mg a day with prior LDL of 82.  Goal less than 70.  Hemoglobin A1c 5.9.      Dispo: 1 yr  Signed, Donato Schultz, MD

## 2023-07-17 ENCOUNTER — Encounter: Payer: Self-pay | Admitting: Family Medicine

## 2023-07-20 ENCOUNTER — Encounter: Payer: Self-pay | Admitting: Family Medicine

## 2023-07-21 ENCOUNTER — Encounter: Payer: Self-pay | Admitting: Family Medicine

## 2023-07-23 DIAGNOSIS — G514 Facial myokymia: Secondary | ICD-10-CM | POA: Diagnosis not present

## 2023-07-23 DIAGNOSIS — H04123 Dry eye syndrome of bilateral lacrimal glands: Secondary | ICD-10-CM | POA: Diagnosis not present

## 2023-07-23 DIAGNOSIS — H26493 Other secondary cataract, bilateral: Secondary | ICD-10-CM | POA: Diagnosis not present

## 2023-07-23 DIAGNOSIS — H43812 Vitreous degeneration, left eye: Secondary | ICD-10-CM | POA: Diagnosis not present

## 2023-08-04 ENCOUNTER — Other Ambulatory Visit: Payer: Self-pay

## 2023-08-04 DIAGNOSIS — F411 Generalized anxiety disorder: Secondary | ICD-10-CM

## 2023-08-04 DIAGNOSIS — G4700 Insomnia, unspecified: Secondary | ICD-10-CM

## 2023-08-04 NOTE — Telephone Encounter (Signed)
Patient called requesting refill on diazepam and alprazolam. Prescription last written 02/11/23  Patient does not have up to date treatment agreement on file.  Medication have been pended and sent to Dr. Jacquenette Shone

## 2023-08-05 ENCOUNTER — Telehealth: Payer: Self-pay

## 2023-08-05 NOTE — Telephone Encounter (Signed)
Patient wants you to know that her wishes you the best, always enjoyed your chats and thank you.

## 2023-08-12 ENCOUNTER — Ambulatory Visit (INDEPENDENT_AMBULATORY_CARE_PROVIDER_SITE_OTHER): Payer: Medicare Other | Admitting: Sports Medicine

## 2023-08-12 ENCOUNTER — Other Ambulatory Visit: Payer: Self-pay

## 2023-08-12 ENCOUNTER — Encounter: Payer: Self-pay | Admitting: Sports Medicine

## 2023-08-12 VITALS — BP 128/72 | HR 66 | Temp 97.8°F | Resp 17 | Ht 71.0 in | Wt 189.0 lb

## 2023-08-12 DIAGNOSIS — I25810 Atherosclerosis of coronary artery bypass graft(s) without angina pectoris: Secondary | ICD-10-CM

## 2023-08-12 DIAGNOSIS — F411 Generalized anxiety disorder: Secondary | ICD-10-CM | POA: Diagnosis not present

## 2023-08-12 DIAGNOSIS — I480 Paroxysmal atrial fibrillation: Secondary | ICD-10-CM | POA: Diagnosis not present

## 2023-08-12 DIAGNOSIS — G4700 Insomnia, unspecified: Secondary | ICD-10-CM

## 2023-08-12 MED ORDER — HYDROCORTISONE (PERIANAL) 2.5 % EX CREA: 1.0000 | TOPICAL_CREAM | Freq: Two times a day (BID) | CUTANEOUS | 1 refills | Status: DC

## 2023-08-12 NOTE — Progress Notes (Signed)
Careteam: Patient Care Team: Venita Sheffield, MD as PCP - General (Internal Medicine) Lanier Prude, MD as PCP - Electrophysiology (Cardiology) Jake Bathe, MD as PCP - Cardiology (Cardiology) Roslynn Amble, MD (Inactive) as Consulting Physician (Pulmonary Disease) Arminda Resides, MD as Consulting Physician (Dermatology) Kerrin Champagne, MD (Inactive) as Consulting Physician (Orthopedic Surgery) Bjorn Pippin, MD as Attending Physician (Urology) Avel Peace, MD as Consulting Physician (General Surgery) Loreli Slot, MD as Consulting Physician (Cardiothoracic Surgery) Drema Halon, MD (Inactive) as Consulting Physician (Otolaryngology) Mathews Robinsons, MD as Consulting Physician (Dermatology)  PLACE OF SERVICE:  [facility]  Advanced Directive information    Allergies  Allergen Reactions   Heparin     Blood clots    Penicillin G Shortness Of Breath   Levaquin [Levofloxacin In D5w] Other (See Comments)    Patient felt jittery and nervous with insomnia on the medication.   Aspirin     Breathing complications, can tolerate baby asa   Codeine     Some is ok   Sucralfate     Could not sleep   Zetia [Ezetimibe]     jittery   Zocor [Simvastatin]     Liver enzymes increased   Plavix [Clopidogrel Bisulfate] Palpitations    No chief complaint on file.    HPI: Patient is a 87 y.o. male is here for follow up  Informs that he had  wasp sting on Saturday on his Rt hand  Swelling improved Does exercise 5 times/ week  Walks 3 miles/ day  Denies chest pain, palpitations, SOB, dizziness  Lives with his wife Independent with ADLS and IADLS  No memory concerns  GAD, OCD Depression  Follows with psychiatry at [place] Worries all the time and reports  On xanax , valium  Reports that he tried zoloft in the past  He is not interested in trying new medications Takes xanax 1 tab daily   Low back pain  Planning to see neurosurgeon  States  he takes to relax his muscles Able to walk  Not interested in other muscle relaxants         Review of Systems:  Review of Systems  Constitutional:  Negative for chills and fever.  HENT:  Negative for congestion and sore throat.   Eyes:  Negative for double vision.  Respiratory:  Negative for cough, sputum production and shortness of breath.   Cardiovascular:  Negative for chest pain, palpitations and leg swelling.  Gastrointestinal:  Negative for abdominal pain, heartburn and nausea.  Genitourinary:  Negative for dysuria, frequency and hematuria.  Musculoskeletal:  Negative for falls and myalgias.  Neurological:  Negative for dizziness, sensory change and focal weakness.    Past Medical History:  Diagnosis Date   Allergic rhinitis due to pollen    Anginal pain (HCC)    once in a while, none recent   Anxiety    Anxiety disorder    Asthma    exercise induced   Atrial fibrillation (HCC)    hx of   Basal cell carcinoma of skin of lip    Clotting disorder (HCC)    Coronary atherosclerosis of native coronary artery    with CABG 3/12  -MAZE procedure    Diastasis recti 05/07/2016   Difficulty urinating    Diverticulosis of colon (without mention of hemorrhage)    mild   Dysuria    Edema    Essential hypertension, benign    Heart attack (HCC) 2012   Helicobacter  pylori (H. 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HCC)    3/12   History of anal fissures    History of cholelithiasis    Hyperlipidemia    Hypertrophy of prostate with urinary obstruction and other lower urinary tract symptoms (LUTS)    Impotence of organic origin    Internal hemorrhoids without mention of complication    Jaundice age 61   Long term (current) use of anticoagulants    Neutropenia (HCC) 10/07/2016   Nonspecific elevation of levels of transaminase or lactic acid dehydrogenase (LDH)    Other voice and resonance disorders    after surgery, took breathing tube out and damaged something    Pneumonia    Pulmonary embolism (HCC) 2012   s/p heart surgery   Pulmonary embolism (HCC)    3/12   Rheumatic fever age 87   Skin cancer    Hx of squamous cell x2   Sleep apnea    no CPAP   Unspecified constipation    Past Surgical History:  Procedure Laterality Date   CARDIAC CATHETERIZATION     CHOLECYSTECTOMY N/A 11/18/2013   Procedure: LAPAROSCOPIC CHOLECYSTECTOMY With IOC;  Surgeon: Adolph Pollack, MD;  Location: [facility];  Service: General;  Laterality: N/A;   CORONARY ARTERY BYPASS GRAFT  2012   Median sternotomy, extracorporeal circulation, coronary   HERNIA REPAIR  2010   bilateral   HYDROCELE EXCISION     dr Annabell Howells    [facility] SURGERY     squamus x2; [facility] fo bridge of nose June 2017.   ROOT CANAL  12/2020   Pr patient he had complications    SCROTUM EXPLORATION  1990's   multiple   SPERMATOCELECTOMY  02/1999   Right, Dr.[name]   SUPERFICIAL SKIN CYSTECTOMY (l) HIP Right 1997   dr Logan Bores   VASECTOMY  04/1992   Social History:   reports that he quit smoking about 45 years ago. His smoking use included cigarettes. He started smoking about 67 years ago. He has a 22 pack-year smoking history. He has never used smokeless tobacco. He reports that he does not currently use alcohol. He reports that he does not use drugs.  Family History  Problem Relation Age of Onset   Bone cancer Mother    Diabetes Mother    Colon cancer Neg Hx    Esophageal cancer Neg Hx    Rectal cancer Neg Hx    Stomach cancer Neg Hx    Lung disease Neg Hx     Medications: Patient's Medications  New Prescriptions   No medications on file  Previous Medications   ACETAMINOPHEN (TYLENOL) 325 MG TABLET    Take 325 mg by mouth at bedtime.   ALBUTEROL (PROVENTIL HFA;VENTOLIN HFA) 108 (90 BASE) MCG/ACT INHALER    Inhale 2 puffs into the lungs every 6 (six) hours as needed for wheezing.   ALPRAZOLAM (XANAX) 0.5 MG TABLET    Take One to Two tablets by mouth once daily as needed for anxiety.   ASPIRIN EC  81 MG TABLET    Take 1 tablet (81 mg total) by mouth every Monday, Wednesday, and Friday. Swallow whole.   DIAZEPAM (VALIUM) 5 MG TABLET    Take One to Two tablets by mouth at bedtime as needed for rest.   HYDROCORTISONE (ANUSOL-HC) 2.5 % RECTAL CREAM    Place 1 application rectally 2 (two) times daily. Use pea sized amount rectally twice daily for 7 days   NITROGLYCERIN (NITROSTAT) 0.4 MG SL  TABLET    Place 0.4 mg under the tongue every 5 (five) minutes as needed.   ROSUVASTATIN (CRESTOR) 10 MG TABLET    Take 1 tablet (10 mg total) by mouth daily.  Modified Medications   No medications on file  Discontinued Medications   No medications on file    Physical Exam:  There were no vitals filed for this visit. There is no height or weight on file to calculate BMI. Wt Readings from Last 3 Encounters:  07/14/23 188 lb 14.4 oz (85.7 kg)  05/15/23 188 lb 8 oz (85.5 kg)  03/16/23 188 lb 9.6 oz (85.5 kg)    Physical Exam Constitutional:      Appearance: Normal appearance.  HENT:     Head: Normocephalic and atraumatic.  Cardiovascular:     Rate and Rhythm: Normal rate and regular rhythm.     Pulses: Normal pulses.     Heart sounds: Normal heart sounds.  Pulmonary:     Effort: No respiratory distress.     Breath sounds: No stridor. No wheezing or rales.  Abdominal:     General: Bowel sounds are normal. There is no distension.     Palpations: Abdomen is soft.     Tenderness: There is no abdominal tenderness. There is no right CVA tenderness or guarding.  Musculoskeletal:        General: No swelling.  Neurological:     Mental Status: He is alert. Mental status is at baseline.     Sensory: No sensory deficit.     Motor: No weakness.     Labs reviewed: Basic Metabolic Panel: Recent Labs    02/11/23 1122  NA 141  K 5.0  CL 102  CO2 32  GLUCOSE 100*  BUN 14  CREATININE 0.98  CALCIUM 9.6   Liver Function Tests: Recent Labs    02/11/23 1122  AST 25  ALT 20  BILITOT 1.6*   PROT 7.0   No results for input(s): "LIPASE", "AMYLASE" in the last 8760 hours. No results for input(s): "AMMONIA" in the last 8760 hours. CBC: No results for input(s): "WBC", "NEUTROABS", "HGB", "HCT", "MCV", "PLT" in the last 8760 hours. Lipid Panel: Recent Labs    02/11/23 1122  CHOL 154  HDL 58  LDLCALC 82  TRIG 65  CHOLHDL 2.7   TSH: No results for input(s): "TSH" in the last 8760 hours. A1C: Lab Results  Component Value Date   HGBA1C 5.9 (H) 01/28/2021     Assessment/Plan  1. Paroxysmal atrial fibrillation (HCC) Rate controlled  Pt does not want to be on anticoagulation  On aspirin   2. Insomnia, unspecified type Discussed sleep hygiene Instructed to take melatonin  Avoid day time naps  Exercise regularly  3. Coronary artery disease involving coronary bypass graft of native heart without angina pectoris Stable  Follows with cardiology  4.  GAD  Pt not interested in trying other options for GAD  He is trying to wean him self off the valium  Reports that he uses valium to relax his muscles in his back  He is able to walk 2- miles daily  Informed patient xanax , valium are considered very high risk medications and due to adverse side effect profile , we need to try safer options He will think about it and let me know at his next visit     No follow-ups on file.: 3-4 months  I spent greater than minutes for the care of this patient in face to face  time, chart review, clinical documentation, patient education.

## 2023-09-03 DIAGNOSIS — L570 Actinic keratosis: Secondary | ICD-10-CM | POA: Diagnosis not present

## 2023-09-03 DIAGNOSIS — Z85828 Personal history of other malignant neoplasm of skin: Secondary | ICD-10-CM | POA: Diagnosis not present

## 2023-09-03 DIAGNOSIS — L820 Inflamed seborrheic keratosis: Secondary | ICD-10-CM | POA: Diagnosis not present

## 2023-09-03 DIAGNOSIS — D225 Melanocytic nevi of trunk: Secondary | ICD-10-CM | POA: Diagnosis not present

## 2023-09-03 DIAGNOSIS — L821 Other seborrheic keratosis: Secondary | ICD-10-CM | POA: Diagnosis not present

## 2023-09-15 ENCOUNTER — Other Ambulatory Visit (HOSPITAL_BASED_OUTPATIENT_CLINIC_OR_DEPARTMENT_OTHER): Payer: Self-pay

## 2023-09-15 MED ORDER — COMIRNATY 30 MCG/0.3ML IM SUSY
0.3000 mL | PREFILLED_SYRINGE | Freq: Once | INTRAMUSCULAR | 0 refills | Status: AC
  Filled 2023-09-15: qty 0.3, 1d supply, fill #0

## 2023-10-13 ENCOUNTER — Encounter: Payer: Self-pay | Admitting: Sports Medicine

## 2023-10-13 ENCOUNTER — Ambulatory Visit (INDEPENDENT_AMBULATORY_CARE_PROVIDER_SITE_OTHER): Payer: Medicare Other | Admitting: Sports Medicine

## 2023-10-13 ENCOUNTER — Telehealth: Payer: Self-pay

## 2023-10-13 VITALS — BP 110/68 | HR 68 | Temp 97.5°F | Resp 16 | Ht 71.0 in | Wt 185.2 lb

## 2023-10-13 DIAGNOSIS — I25810 Atherosclerosis of coronary artery bypass graft(s) without angina pectoris: Secondary | ICD-10-CM

## 2023-10-13 DIAGNOSIS — G4700 Insomnia, unspecified: Secondary | ICD-10-CM

## 2023-10-13 DIAGNOSIS — N401 Enlarged prostate with lower urinary tract symptoms: Secondary | ICD-10-CM | POA: Diagnosis not present

## 2023-10-13 DIAGNOSIS — G8929 Other chronic pain: Secondary | ICD-10-CM | POA: Diagnosis not present

## 2023-10-13 DIAGNOSIS — M545 Low back pain, unspecified: Secondary | ICD-10-CM | POA: Diagnosis not present

## 2023-10-13 DIAGNOSIS — F411 Generalized anxiety disorder: Secondary | ICD-10-CM | POA: Diagnosis not present

## 2023-10-13 DIAGNOSIS — I10 Essential (primary) hypertension: Secondary | ICD-10-CM

## 2023-10-13 DIAGNOSIS — E7849 Other hyperlipidemia: Secondary | ICD-10-CM

## 2023-10-13 DIAGNOSIS — R351 Nocturia: Secondary | ICD-10-CM

## 2023-10-13 MED ORDER — DIAZEPAM 5 MG PO TABS
ORAL_TABLET | ORAL | 0 refills | Status: DC
Start: 2023-10-13 — End: 2023-11-10

## 2023-10-13 MED ORDER — ALPRAZOLAM 0.5 MG PO TABS
ORAL_TABLET | ORAL | 0 refills | Status: DC
Start: 2023-10-13 — End: 2023-11-10

## 2023-10-13 NOTE — Progress Notes (Signed)
Careteam: Patient Care Team: Venita Sheffield, MD as PCP - General (Internal Medicine) Lanier Prude, MD as PCP - Electrophysiology (Cardiology) Jake Bathe, MD as PCP - Cardiology (Cardiology) Roslynn Amble, MD as Consulting Physician (Pulmonary Disease) Arminda Resides, MD as Consulting Physician (Dermatology) Kerrin Champagne, MD (Inactive) as Consulting Physician (Orthopedic Surgery) Bjorn Pippin, MD as Attending Physician (Urology) Avel Peace, MD as Consulting Physician (General Surgery) Loreli Slot, MD as Consulting Physician (Cardiothoracic Surgery) Drema Halon, MD (Inactive) as Consulting Physician (Otolaryngology) Mathews Robinsons, MD as Consulting Physician (Dermatology)  PLACE OF SERVICE:  [facility]  Advanced Directive information Does Patient Have a Medical Advance Directive?: Yes, Type of Advance Directive: Healthcare Power of [place];Living will;Out of facility DNR (pink MOST or yellow form), Does patient want to make changes to medical advance directive?: No - Patient declined  Allergies  Allergen Reactions   Heparin     Blood clots    Penicillin G Shortness Of Breath   Levaquin [Levofloxacin In D5w] Other (See Comments)    Patient felt jittery and nervous with insomnia on the medication.   Aspirin     Breathing complications, can tolerate baby asa   Codeine     Some is ok   Sucralfate     Could not sleep   Zetia [Ezetimibe]     jittery   Zocor [Simvastatin]     Liver enzymes increased   Plavix [Clopidogrel Bisulfate] Palpitations    Chief Complaint  Patient presents with   Medical Management of Chronic Issues    2 month follow up.    Immunizations    Discuss the need for Shingrix vaccine, and Influenza vaccine. [facility] Verified.      HPI: Patient is a 87 y.o. male is here for follow up  He exercises 4-5 times/ week  Walks 2.5- 3 miles every day , does stretching exercises, lifts lightweights.  [facility]   BPH  Frequent urination  Denies hematuria, lower abdominal pain Nocturia + Wakes up in the middle of night  Says he cannot take finasteride  Not taking terazosin    Afib  Not on [facility], patient does not want to take  anticoagulation  Follows with cardiology   GAD  On valium, xanax  Pt states he takes to relax He is not open about trying other meds , says he is resistant to most medications  Low back pain  [facility]   Ambulates independently  Walks 2 miles every day  Denies lower extremity weakness, or numbness Denies urinary or fecal incontinence    Review of Systems:  Review of Systems  Constitutional:  Negative for chills and fever.  HENT:  Negative for congestion and sore throat.   Eyes:  Negative for double vision.  Respiratory:  Negative for cough, sputum production and shortness of breath.   Cardiovascular:  Negative for chest pain, palpitations and leg swelling.  Gastrointestinal:  Negative for abdominal pain, heartburn and nausea.  Genitourinary:  Negative for dysuria, frequency and hematuria.       Nocturia  Musculoskeletal:  Positive for back pain. Negative for falls and myalgias.  Neurological:  Negative for dizziness, sensory change and focal weakness.  Psychiatric/Behavioral:  Positive for depression. The patient is nervous/anxious.     Past Medical History:  Diagnosis Date   Allergic rhinitis due to pollen    Anginal pain (HCC)  once in a while, none recent   Anxiety    Anxiety disorder    Asthma    exercise induced   Atrial fibrillation (HCC)    hx of   Basal cell carcinoma of skin of lip    Clotting disorder (HCC)    Coronary atherosclerosis of native coronary artery    with CABG 3/12  -MAZE procedure    Diastasis recti 05/07/2016   Difficulty urinating    Diverticulosis of colon (without mention of hemorrhage)    mild   Dysuria    Edema    Essential hypertension,  benign    Heart attack (HCC) 2012   Helicobacter pylori (H. 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HCC)    3/12   History of anal fissures    History of cholelithiasis    Hyperlipidemia    Hypertrophy of prostate with urinary obstruction and other lower urinary tract symptoms (LUTS)    Impotence of organic origin    Internal hemorrhoids without mention of complication    Jaundice age 26   Long term (current) use of anticoagulants    Neutropenia (HCC) 10/07/2016   Nonspecific elevation of levels of transaminase or lactic acid dehydrogenase (LDH)    Other voice and resonance disorders    after surgery, took breathing tube out and damaged something   Pneumonia    Pulmonary embolism (HCC) 2012   s/p heart surgery   Pulmonary embolism (HCC)    3/12   Rheumatic fever age 24   Skin cancer    Hx of squamous cell x2   Sleep apnea    no CPAP   Unspecified constipation    Past Surgical History:  Procedure Laterality Date   CARDIAC CATHETERIZATION     CHOLECYSTECTOMY N/A 11/18/2013   Procedure: LAPAROSCOPIC CHOLECYSTECTOMY With IOC;  Surgeon: Adolph Pollack, MD;  Location: [facility];  Service: General;  Laterality: N/A;   CORONARY ARTERY BYPASS GRAFT  2012   Median sternotomy, extracorporeal circulation, coronary   HERNIA REPAIR  2010   bilateral   HYDROCELE EXCISION     dr Annabell Howells    [facility] SURGERY     squamus x2; [facility] fo bridge of nose June 2017.   ROOT CANAL  12/2020   Pr patient he had complications    SCROTUM EXPLORATION  1990's   multiple   SPERMATOCELECTOMY  02/1999   Right, Dr.[name]   SUPERFICIAL SKIN CYSTECTOMY (l) HIP Right 1997   dr Logan Bores   VASECTOMY  04/1992   Social History:   reports that he quit smoking about 45 years ago. His smoking use included cigarettes. He started smoking about 67 years ago. He has a 22 pack-year smoking history. He has never used smokeless tobacco. He reports that he does not currently use alcohol. He reports that he does not use  drugs.  Family History  Problem Relation Age of Onset   Bone cancer Mother    Diabetes Mother    Colon cancer Neg Hx    Esophageal cancer Neg Hx    Rectal cancer Neg Hx    Stomach cancer Neg Hx    Lung disease Neg Hx     Medications: Patient's Medications  New Prescriptions   No medications on file  Previous Medications   ACETAMINOPHEN (TYLENOL) 325 MG TABLET    Take 325 mg by mouth at bedtime.   ALBUTEROL (PROVENTIL HFA;VENTOLIN HFA) 108 (90 BASE) MCG/ACT INHALER    Inhale 2 puffs into the lungs every 6 (  six) hours as needed for wheezing.   ALPRAZOLAM (XANAX) 0.5 MG TABLET    Take One to Two tablets by mouth once daily as needed for anxiety.   ASPIRIN EC 81 MG TABLET    Take 1 tablet (81 mg total) by mouth every Monday, Wednesday, and Friday. Swallow whole.   DIAZEPAM (VALIUM) 5 MG TABLET    Take One to Two tablets by mouth at bedtime as needed for rest.   HYDROCORTISONE (ANUSOL-HC) 2.5 % RECTAL CREAM    Place 1 Application rectally 2 (two) times daily. Use pea sized amount rectally twice daily for 7 days   NITROGLYCERIN (NITROSTAT) 0.4 MG SL TABLET    Place 0.4 mg under the tongue every 5 (five) minutes as needed.   ROSUVASTATIN (CRESTOR) 10 MG TABLET    Take 1 tablet (10 mg total) by mouth daily.  Modified Medications   No medications on file  Discontinued Medications   No medications on file    Physical Exam:  Vitals:   10/13/23 0951  BP: 110/68  Pulse: 68  Resp: 16  Temp: (!) 97.5 F (36.4 C)  SpO2: 98%  Weight: 185 lb 3.2 oz (84 kg)  Height: 5\' 11"  (1.803 m)   Body mass index is 25.83 kg/m. Wt Readings from Last 3 Encounters:  10/13/23 185 lb 3.2 oz (84 kg)  08/12/23 189 lb (85.7 kg)  07/14/23 188 lb 14.4 oz (85.7 kg)    Physical Exam Constitutional:      Appearance: Normal appearance.  HENT:     Head: Normocephalic and atraumatic.  Cardiovascular:     Rate and Rhythm: Normal rate and regular rhythm.     Pulses: Normal pulses.     Heart sounds:  Normal heart sounds.  Pulmonary:     Effort: No respiratory distress.     Breath sounds: No stridor. No wheezing or rales.  Abdominal:     General: Bowel sounds are normal. There is no distension.     Palpations: Abdomen is soft.     Tenderness: There is no abdominal tenderness. There is no right CVA tenderness or guarding.  Musculoskeletal:        General: No swelling.  Neurological:     Mental Status: He is alert. Mental status is at baseline.     Sensory: No sensory deficit.     Motor: No weakness.     Labs reviewed: Basic Metabolic Panel: Recent Labs    02/11/23 1122  NA 141  K 5.0  CL 102  CO2 32  GLUCOSE 100*  BUN 14  CREATININE 0.98  CALCIUM 9.6   Liver Function Tests: Recent Labs    02/11/23 1122  AST 25  ALT 20  BILITOT 1.6*  PROT 7.0   No results for input(s): "LIPASE", "AMYLASE" in the last 8760 hours. No results for input(s): "AMMONIA" in the last 8760 hours. CBC: No results for input(s): "WBC", "NEUTROABS", "HGB", "HCT", "MCV", "PLT" in the last 8760 hours. Lipid Panel: Recent Labs    02/11/23 1122  CHOL 154  HDL 58  LDLCALC 82  TRIG 65  CHOLHDL 2.7   TSH: No results for input(s): "TSH" in the last 8760 hours. A1C: Lab Results  Component Value Date   HGBA1C 5.9 (H) 01/28/2021     Assessment/Plan  1. GAD (generalized anxiety disorder) Informed patient that valium, xanax are considered very high risk medications Pt reports that he is sensitive to most medications Informed patient that he needs to see psychiatry and  will do refill on these meds until he gets to see psychiatry - Ambulatory referral to Psychiatry - diazepam (VALIUM) 5 MG tablet; Take One to Two tablets by mouth at bedtime as needed for rest.  Dispense: 30 tablet; Refill: 0 - ALPRAZolam (XANAX) 0.5 MG tablet; Take One to Two tablets by mouth once daily as needed for anxiety.  Dispense: 30 tablet; Refill: 0  2. Insomnia, unspecified type Discussed sleep hygiene -  diazepam (VALIUM) 5 MG tablet; Take One to Two tablets by mouth at bedtime as needed for rest.  Dispense: 30 tablet; Refill: 0  3. Anxiety state Needs to see psychiatry - ALPRAZolam (XANAX) 0.5 MG tablet; Take One to Two tablets by mouth once daily as needed for anxiety.  Dispense: 30 tablet; Refill: 0  4. Chronic low back pain without sciatica, unspecified back pain laterality No red flag signs MRI reviewed His PCP from [place] arranged for a referral to spine specialist   5. Benign prostatic hyperplasia with nocturia Nocturia Could not tolerate finasteride and terazosin   Afib  Rate controlled Not interested in taking AC On aspirin  No follow-ups on file.: 4 months

## 2023-10-13 NOTE — Telephone Encounter (Signed)
Venita Sheffield, MD  You; Dillard, Jasmine E, CMA1 hour ago (11:29 AM)    Pt wants to have  valium, xanax refilled, I told him that  I do not refill and he needs to see a psychiatrist which he is not happy about. I told him and I will do a refill for a month until he gets to see a psychiatrist  .He had lab work done in April and looks good, no indication for repeat labs as he is only taking xanax, valium.    Spoke with patient, patient states he would like to have labs done yearly as he has to advocate for himself and stay on top of his health. Patient states he had labs done in March of this year and would like labs done in March of next year. Patient states having labs done once yearly is standard from his perspective.

## 2023-10-13 NOTE — Telephone Encounter (Signed)
Patient had a visit today and asked the check out patient care advocate, Dennie Bible a question about why it was not indicated for him to have labs prior to his next appointment (as per the patient he has always had labs prior or at his follow-up's).  Pat asked that I further assist the patient, as she was not sure why the provider did not indicate for patient to have labs. I spoke with patient and he stated he is not happy with his visit today and felt rushed. Patient asked that I speak with Venita Sheffield, MD and have her provide orders for labs to be drawn prior to his next appointment in March. Patient emphasized that those labs should include a PSA.   Lab appointment is scheduled for 02/08/2024 and patient will see Dr.Veludandi on 02/10/2024.

## 2023-10-14 NOTE — Telephone Encounter (Signed)
Providers response:  Venita Sheffield, MD  You19 hours ago (12:51 PM)    I will order labs at his next visit in March   Outgoing call placed to patient and he stated that he would like to press the request for having the labs done prior to the appointment and that way the labs will be discussed at the appointment versus phone calls back and forth to discuss the labs. Patient emphasized that he has done it this way for years and "It just makes sense to have labs prior."

## 2023-10-14 NOTE — Telephone Encounter (Signed)
Will let PCP address.

## 2023-10-15 NOTE — Telephone Encounter (Signed)
Alexander Sheffield, MD  You2 hours ago (11:27 AM)     Ok with lab orders  cbc, cmp, PSA, lipid panel 1 week prior to his next appt.  Orders placed, patient is aware of response, and expressed his gratitude.

## 2023-10-21 ENCOUNTER — Other Ambulatory Visit: Payer: Self-pay | Admitting: Sports Medicine

## 2023-10-21 DIAGNOSIS — F411 Generalized anxiety disorder: Secondary | ICD-10-CM

## 2023-11-10 ENCOUNTER — Other Ambulatory Visit: Payer: Self-pay

## 2023-11-10 DIAGNOSIS — F411 Generalized anxiety disorder: Secondary | ICD-10-CM

## 2023-11-10 DIAGNOSIS — G4700 Insomnia, unspecified: Secondary | ICD-10-CM

## 2023-11-10 MED ORDER — DIAZEPAM 5 MG PO TABS
ORAL_TABLET | ORAL | 0 refills | Status: DC
Start: 2023-11-10 — End: 2023-12-15

## 2023-11-10 MED ORDER — ALPRAZOLAM 0.5 MG PO TABS: ORAL_TABLET | ORAL | 0 refills | Status: DC

## 2023-11-10 NOTE — Telephone Encounter (Signed)
Informed patient that he needs to see psychiatry for future refills I will be refilling for one more month

## 2023-11-10 NOTE — Telephone Encounter (Signed)
Patient is requesting a refill of the following medications: Requested Prescriptions   Pending Prescriptions Disp Refills   diazepam (VALIUM) 5 MG tablet 30 tablet 0    Sig: Take One to Two tablets by mouth at bedtime as needed for rest.   ALPRAZolam (XANAX) 0.5 MG tablet 30 tablet 0    Sig: Take One to Two tablets by mouth once daily as needed for anxiety.

## 2023-12-15 ENCOUNTER — Telehealth: Payer: Self-pay

## 2023-12-15 DIAGNOSIS — F411 Generalized anxiety disorder: Secondary | ICD-10-CM

## 2023-12-15 DIAGNOSIS — G4700 Insomnia, unspecified: Secondary | ICD-10-CM

## 2023-12-15 MED ORDER — DIAZEPAM 5 MG PO TABS: ORAL_TABLET | ORAL | 0 refills | Status: DC

## 2023-12-15 MED ORDER — ALPRAZOLAM 0.5 MG PO TABS: ORAL_TABLET | ORAL | 0 refills | Status: DC

## 2023-12-15 NOTE — Telephone Encounter (Signed)
 Below is Alexander [name]'s reply:  Pt can always call his insurance to see who is in his network. I will be happy to send the referral to any office that he finds to be in his network.  Pt was also made aware that he didn't have to wait on their phone to schedule, he could call himself as a referral is not needed.   Thanks, Geni    Thanks for your reply Geni, I selected [facility] for the pending referral ( Dr.V will need to associate and sign). [facility] is part of [facility] and [facility] would be for you to call and see if they are in network or share your above response with the patient

## 2023-12-15 NOTE — Telephone Encounter (Signed)
 Patient called to say he finally received a call from the [facility] in [facility] (referral made in November) just to find out that they are out of network as well. Patient states he needs a referral sooner than later for a group that is within network.   Patient said it would be of benefit if the person handling the referral would call first to confirm they are in network versus waiting for months to find out that he can not be seen  Patient needs a refill on the xanax  and valium  for [facility] and [place] until he is able to get an appointment

## 2023-12-15 NOTE — Telephone Encounter (Signed)
 Below is provider response:   Alexander Sheffield, MD  You1 hour ago (12:04 PM)    I did the refill and replaced psychiatry referral.  Patient is aware and verbalized understanding

## 2023-12-23 ENCOUNTER — Other Ambulatory Visit: Payer: Self-pay | Admitting: Sports Medicine

## 2024-01-18 ENCOUNTER — Other Ambulatory Visit: Payer: Self-pay | Admitting: *Deleted

## 2024-01-18 DIAGNOSIS — G4700 Insomnia, unspecified: Secondary | ICD-10-CM

## 2024-01-18 DIAGNOSIS — F411 Generalized anxiety disorder: Secondary | ICD-10-CM

## 2024-01-18 MED ORDER — ALPRAZOLAM 0.5 MG PO TABS: ORAL_TABLET | ORAL | 0 refills | Status: DC

## 2024-01-18 MED ORDER — DIAZEPAM 5 MG PO TABS: ORAL_TABLET | ORAL | 0 refills | Status: DC

## 2024-01-18 NOTE — Telephone Encounter (Signed)
 Patient called and stated that he needs a refill until he can get into the Psychiatry office.   Has an appointment  with [facility] on 02/03/2024 and needs the Rx until he can establish with them.   Pended Rx's and sent to Dr. Nathaneil Bakes for approval.

## 2024-02-01 ENCOUNTER — Ambulatory Visit: Payer: Medicare Other | Admitting: Behavioral Health

## 2024-02-01 ENCOUNTER — Encounter: Payer: Self-pay | Admitting: Behavioral Health

## 2024-02-01 DIAGNOSIS — F411 Generalized anxiety disorder: Secondary | ICD-10-CM | POA: Diagnosis not present

## 2024-02-01 DIAGNOSIS — G4700 Insomnia, unspecified: Secondary | ICD-10-CM

## 2024-02-01 MED ORDER — ALPRAZOLAM 0.25 MG PO TABS: 0.2500 mg | ORAL_TABLET | Freq: Every evening | ORAL | 3 refills | Status: DC | PRN

## 2024-02-01 MED ORDER — DIAZEPAM 5 MG PO TABS: ORAL_TABLET | ORAL | 3 refills | Status: DC

## 2024-02-01 NOTE — Progress Notes (Signed)
 [facility] MD/PA/NP Initial Note  02/01/2024 11:35 AM Alexander [name]  MRN:  [id number]  Chief Complaint:  Chief Complaint   Anxiety; Insomnia; Medication Refill; Establish Care; Patient Education     HPI:   "Codi", 88 year old male presents to this office for initial visit and to establish care.  Collateral information should be considered reliable.  Patient is alert and oriented x 4.  States that he has been receiving his care from the [place] for many years.  Says that his PCP recently retired and there was reluctance to continue his Xanax and Valium.  Says that he has been on the medication for 25 to 30 years and never had a problem with abuse.  Says that the medication continues to help him with sleep, anxiety, and relaxation.  Says that he has noticed when he does not take the medication he struggles more to even get up the stairs.  He is here today to request refill of the medication and agrees to regular follow-ups with this office.  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  He is married and lives with wife in [place].  His pH Q2 was negative.  MDQ was negative.  Says that his anxiety today is 3/10, and depression is 0/10.  He denies any history of mania, no psychosis, no auditory or visual hallucinations or delirium.  Denies SI or HI.  Patient has a history of cardiac bypass.  Says that he has strong family support.  Past psychiatric medication trials: Valium Xanax    Visit Diagnosis:    ICD-10-CM   1. GAD (generalized anxiety disorder)  F41.1 ALPRAZolam (XANAX) 0.25 MG tablet    diazepam (VALIUM) 5 MG tablet    2. Insomnia, unspecified type  G47.00 ALPRAZolam (XANAX) 0.25 MG tablet    diazepam (VALIUM) 5 MG tablet    3. Anxiety state  F41.1       Past Psychiatric History: Anxiety State, OCD  Past Medical History:  Past Medical History:  Diagnosis Date   Allergic rhinitis due to pollen    Anginal pain (HCC)    once in a while, none recent   Anxiety     Anxiety disorder    Asthma    exercise induced   Atrial fibrillation (HCC)    hx of   Basal cell carcinoma of skin of lip    Clotting disorder (HCC)    Coronary atherosclerosis of native coronary artery    with CABG 3/12  -MAZE procedure    Diastasis recti 05/07/2016   Difficulty urinating    Diverticulosis of colon (without mention of hemorrhage)    mild   Dysuria    Edema    Essential hypertension, benign    Heart attack (HCC) 2012   Helicobacter pylori (H. 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HCC)    3/12   History of anal fissures    History of cholelithiasis    Hyperlipidemia    Hypertrophy of prostate with urinary obstruction and other lower urinary tract symptoms (LUTS)    Impotence of organic origin    Internal hemorrhoids without mention of complication    Jaundice age 51   Long term (current) use of anticoagulants    Neutropenia (HCC) 10/07/2016   Nonspecific elevation of levels of transaminase or lactic acid dehydrogenase (LDH)    Other voice and resonance disorders    after surgery, took breathing tube out and damaged something   Pneumonia    Pulmonary embolism (HCC) 2012  s/p heart surgery   Pulmonary embolism (HCC)    3/12   Rheumatic fever age 14   Skin cancer    Hx of squamous cell x2   Sleep apnea    no CPAP   Unspecified constipation     Past Surgical History:  Procedure Laterality Date   CARDIAC CATHETERIZATION     CHOLECYSTECTOMY N/A 11/18/2013   Procedure: LAPAROSCOPIC CHOLECYSTECTOMY With IOC;  Surgeon: Adolph Pollack, MD;  Location: [facility];  Service: General;  Laterality: N/A;   CORONARY ARTERY BYPASS GRAFT  2012   Median sternotomy, extracorporeal circulation, coronary   HERNIA REPAIR  2010   bilateral   HYDROCELE EXCISION     dr Annabell Howells    [facility] SURGERY     squamus x2; [facility] fo bridge of nose June 2017.   ROOT CANAL  12/2020   Pr patient he had complications    SCROTUM EXPLORATION  1990's   multiple   SPERMATOCELECTOMY   02/1999   Right, Dr.[name]   SUPERFICIAL SKIN CYSTECTOMY (l) HIP Right 1997   dr Logan Bores   VASECTOMY  04/1992    Family Psychiatric History: none   Family History:  Family History  Problem Relation Age of Onset   Bone cancer Mother    Diabetes Mother    Colon cancer Neg Hx    Esophageal cancer Neg Hx    Rectal cancer Neg Hx    Stomach cancer Neg Hx    Lung disease Neg Hx     Social History:  Social History   Socioeconomic History   Marital status: Married    Spouse name: [name]   Number of children: 1   Years of education: Not on file   Highest education level: Not on file  Occupational History   Occupation: Museum/gallery conservator: [employer]  Tobacco Use   Smoking status: Former    Current packs/day: 0.00    Average packs/day: 1 pack/day for 22.0 years (22.0 ttl pk-yrs)    Types: Cigarettes    Start date: 08/22/1956    Quit date: 12/08/1977    Years since quitting: 46.1   Smokeless tobacco: Never   Tobacco comments:    significant second-hand smoke exposure at work  Psychologist, educational Use   Vaping status: Never Used  Substance and Sexual Activity   Alcohol use: Not Currently    Comment: former heavy alcohol use, rare use   Drug use: No   Sexual activity: Yes    Partners: Female  Other Topics Concern   Not on file  Social History Narrative   Daily caffeine       [name]   [name]   Dr. Salena Saner Young-Pulmonologist   [name]   [name]    [name]   [name]   Dr.Simonds-Pulmonologist   Dr.Rosenbower-General Surgeon   Dr.Stephen [name]-Cardiac Surgeon   Dr.Granfortuna-Hematologist    Dr.Ramaswamy-Pulmonary   Dr.Chris Newman-ENT       [facility]:   Reports he previously worked in [employer] with significant second-hand smoked exposure. No pets currently. No hot tub or bird exposure.    Married wife Trinna Post   Social Drivers of Corporate investment banker  Strain: Low Risk  (11/20/2017)   Overall Financial Resource Strain (CARDIA)    Difficulty of Paying Living Expenses: Not hard at all  Food Insecurity: No Food Insecurity (11/20/2017)   Hunger Vital Sign    Worried About Running Out of Food in the Last Year: Never true  Ran Out of Food in the Last Year: Never true  Transportation Needs: No Transportation Needs (11/20/2017)   PRAPARE - Administrator, Civil Service (Medical): No    Lack of Transportation (Non-Medical): No  Physical Activity: Unknown (11/26/2018)   Exercise Vital Sign    Days of Exercise per Week: 6 days    Minutes of Exercise per Session: Not on file  Stress: Stress Concern Present (11/20/2017)   [employer] of Occupational Health - Occupational Stress Questionnaire    Feeling of Stress : To some extent  Social Connections: Moderately Integrated (11/20/2017)   Social Connection and Isolation Panel [NHANES]    Frequency of Communication with Friends and Family: More than three times a week    Frequency of Social Gatherings with Friends and Family: More than three times a week    Attends Religious Services: Never    Database administrator or Organizations: Yes    Attends Banker Meetings: 1 to 4 times per year    Marital Status: Married    Allergies:  Allergies  Allergen Reactions   Heparin     Blood clots    Penicillin G Shortness Of Breath   Levaquin [Levofloxacin In D5w] Other (See Comments)    Patient felt jittery and nervous with insomnia on the medication.   Aspirin     Breathing complications, can tolerate baby asa   Codeine     Some is ok   Sucralfate     Could not sleep   Zetia [Ezetimibe]     jittery   Zocor [Simvastatin]     Liver enzymes increased   Plavix [Clopidogrel Bisulfate] Palpitations    Metabolic Disorder Labs: Lab Results  Component Value Date   HGBA1C 5.9 (H) 01/28/2021   MPG 123 01/28/2021   MPG 120 10/07/2016   No results found for:  "PROLACTIN" Lab Results  Component Value Date   CHOL 154 02/11/2023   TRIG 65 02/11/2023   HDL 58 02/11/2023   CHOLHDL 2.7 02/11/2023   VLDL 17 10/07/2016   LDLCALC 82 02/11/2023   LDLCALC 84 02/04/2022   Lab Results  Component Value Date   TSH 3.340 10/17/2014   TSH 2.20 09/12/2014    Therapeutic Level Labs: No results found for: "LITHIUM" No results found for: "VALPROATE" No results found for: "CBMZ"  Current Medications: Current Outpatient Medications  Medication Sig Dispense Refill   ALPRAZolam (XANAX) 0.25 MG tablet Take 1 tablet (0.25 mg total) by mouth at bedtime as needed for anxiety. 30 tablet 3   diazepam (VALIUM) 5 MG tablet Take 1/2-1 tablet by mouth at bedtime 30 tablet 3   acetaminophen (TYLENOL) 325 MG tablet Take 325 mg by mouth at bedtime.     albuterol (PROVENTIL HFA;VENTOLIN HFA) 108 (90 BASE) MCG/ACT inhaler Inhale 2 puffs into the lungs every 6 (six) hours as needed for wheezing. 1 Inhaler 0   aspirin EC 81 MG tablet Take 1 tablet (81 mg total) by mouth every Monday, Wednesday, and Friday. Swallow whole. 45 tablet 3   hydrocortisone (ANUSOL-HC) 2.5 % rectal cream APPLY PEA SIZED AMOUNT RECTALLY TO THE AFFECTED AREA TWICE DAILY FOR 7 DAYS 30 g 1   nitroGLYCERIN (NITROSTAT) 0.4 MG SL tablet Place 0.4 mg under the tongue every 5 (five) minutes as needed.     rosuvastatin (CRESTOR) 10 MG tablet Take 1 tablet (10 mg total) by mouth daily. 30 tablet 11   No current facility-administered medications for this visit.  Medication Side Effects: none  Orders placed this visit:  No orders of the defined types were placed in this encounter.   Psychiatric Specialty Exam:  Review of Systems  HENT:  Positive for sinus pain and tinnitus.   Respiratory:  Positive for shortness of breath.   Cardiovascular:  Positive for palpitations.  Gastrointestinal:  Positive for constipation.  Genitourinary:  Positive for frequency and urgency.  Musculoskeletal:  Positive for  back pain, joint swelling and neck pain.  Allergic/Immunologic: Negative.     Blood pressure 138/68, pulse 91, height 5\' 9"  (1.753 m), weight 186 lb (84.4 kg).Body mass index is 27.47 kg/m.  General Appearance: Casual, Neat, and Well Groomed  Eye Contact:  Good  Speech:  Clear and Coherent  Volume:  Normal  Mood:  NA  Affect:  Appropriate  Thought Process:  Coherent  Orientation:  Full (Time, Place, and Person)  Thought Content: Logical   Suicidal Thoughts:  No  Homicidal Thoughts:  No  Memory:  WNL  Judgement:  Good  Insight:  Good  Psychomotor Activity:  Normal  Concentration:  Concentration: Good  Recall:  Good  Fund of Knowledge: Good  Language: Good  Assets:  Desire for Improvement  ADL's:  Intact  Cognition: WNL  Prognosis:  Good   Screenings:  AUDIT    Flowsheet Row Office Visit from 01/27/2019 in [facility] Office Visit from 09/16/2018 in [facility]  Alcohol Use Disorder Identification Test Final Score (AUDIT) 3 3      Mini-Mental    Flowsheet Row Clinical Support from 11/26/2018 in [facility] Clinical Support from 11/20/2017 in [facility] Clinical Support from 10/07/2016 in [facility] Office Visit from 11/06/2015 in [facility] Office Visit from 10/18/2014 in [facility] Adult Medicine  Total Score (max 30 points ) 30 29 30 29 29       PHQ2-9    Flowsheet Row Office Visit from 08/12/2023 in [facility] Adult [facility] Office Visit from 02/20/2023 in [facility] Adult Medicine Erroneous Encounter from 02/19/2023 in [facility] Adult Medicine Office Visit from 02/11/2023 in [facility] Adult Medicine Clinical Support from 02/13/2022  in [facility] Adult [facility]  PHQ-2 Total Score 0 0 0 0 0      Flowsheet Row ED from 07/12/2021 in [facility] Emergency Department at [facility]  C-SSRS RISK CATEGORY No Risk       Receiving Psychotherapy: No   Treatment Plan/Recommendations:   Greater than 50% of 60 min  face to face time with patient was spent on counseling and coordination of care. We discussed his problems with anxiety  and OCD stemming back 30+ years.  He receives most of his care from the [place].  Says that his regular doctor has retired and now having difficulty keeping his prescription of Xanax and Valium.  Patient is on very low-dose at this time.  Patient is 88 years old and very fit for his age.  It is my opinion that the risk of discontinuing the medication after 30 years of use would not benefit the patient and may put him at risk for further decline.  He uses the medication appropriately  for insomnia, anxiety state, and says that it helps with his mobility due to chronic pain.  The patient has agreed to regular follow-ups every 3-6 months as required.  Patient follows up with his PCP on regular basis. We agreed to: To continue Xanax 0.25 mg at bedtime To continue Valium 2.5 mg at bedtime.  May take the other half if experiencing increased anxiety during the day. Will follow-up in 3 months to reassess Will report worsening symptoms or side effects.  Patient understands that this is fall risk medication.  Will notify this office if experiencing changes in cognition. Provided emergency contact information. Discussed potential benefits, risk, and side effects of benzodiazepines to include potential risk of tolerance and dependence, as well as possible drowsiness.  Advised patient not to drive if experiencing drowsiness and to take lowest possible effective dose to minimize risk of dependence and tolerance.  Reviewed PDMP     Joan Flores, NP

## 2024-02-05 ENCOUNTER — Other Ambulatory Visit: Payer: Self-pay

## 2024-02-05 DIAGNOSIS — I10 Essential (primary) hypertension: Secondary | ICD-10-CM

## 2024-02-05 DIAGNOSIS — N401 Enlarged prostate with lower urinary tract symptoms: Secondary | ICD-10-CM

## 2024-02-05 DIAGNOSIS — I25810 Atherosclerosis of coronary artery bypass graft(s) without angina pectoris: Secondary | ICD-10-CM

## 2024-02-05 DIAGNOSIS — E7849 Other hyperlipidemia: Secondary | ICD-10-CM

## 2024-02-08 ENCOUNTER — Other Ambulatory Visit: Payer: Medicare Other

## 2024-02-08 DIAGNOSIS — E7849 Other hyperlipidemia: Secondary | ICD-10-CM | POA: Diagnosis not present

## 2024-02-08 DIAGNOSIS — I25810 Atherosclerosis of coronary artery bypass graft(s) without angina pectoris: Secondary | ICD-10-CM | POA: Diagnosis not present

## 2024-02-08 DIAGNOSIS — R350 Frequency of micturition: Secondary | ICD-10-CM | POA: Diagnosis not present

## 2024-02-08 DIAGNOSIS — I10 Essential (primary) hypertension: Secondary | ICD-10-CM | POA: Diagnosis not present

## 2024-02-09 LAB — CBC WITH DIFFERENTIAL/PLATELET
Absolute Lymphocytes: 1758 {cells}/uL (ref 850–3900)
Absolute Monocytes: 441 {cells}/uL (ref 200–950)
Basophils Absolute: 32 {cells}/uL (ref 0–200)
Basophils Relative: 0.5 %
Eosinophils Absolute: 88 {cells}/uL (ref 15–500)
Eosinophils Relative: 1.4 %
HCT: 47.1 % (ref 38.5–50.0)
Hemoglobin: 15.9 g/dL (ref 13.2–17.1)
MCH: 31.7 pg (ref 27.0–33.0)
MCHC: 33.8 g/dL (ref 32.0–36.0)
MCV: 93.8 fL (ref 80.0–100.0)
MPV: 9.3 fL (ref 7.5–12.5)
Monocytes Relative: 7 %
Neutro Abs: 3982 {cells}/uL (ref 1500–7800)
Neutrophils Relative %: 63.2 %
Platelets: 238 10*3/uL (ref 140–400)
RBC: 5.02 10*6/uL (ref 4.20–5.80)
RDW: 12 % (ref 11.0–15.0)
Total Lymphocyte: 27.9 %
WBC: 6.3 10*3/uL (ref 3.8–10.8)

## 2024-02-09 LAB — COMPLETE METABOLIC PANEL WITH GFR
AG Ratio: 1.6 (calc) (ref 1.0–2.5)
ALT: 18 U/L (ref 9–46)
AST: 24 U/L (ref 10–35)
Albumin: 4.3 g/dL (ref 3.6–5.1)
Alkaline phosphatase (APISO): 43 U/L (ref 35–144)
BUN: 16 mg/dL (ref 7–25)
CO2: 30 mmol/L (ref 20–32)
Calcium: 9.8 mg/dL (ref 8.6–10.3)
Chloride: 103 mmol/L (ref 98–110)
Creat: 1.03 mg/dL (ref 0.70–1.22)
Globulin: 2.7 g/dL (ref 1.9–3.7)
Glucose, Bld: 105 mg/dL — ABNORMAL HIGH (ref 65–99)
Potassium: 4.5 mmol/L (ref 3.5–5.3)
Sodium: 142 mmol/L (ref 135–146)
Total Bilirubin: 1.4 mg/dL — ABNORMAL HIGH (ref 0.2–1.2)
Total Protein: 7 g/dL (ref 6.1–8.1)
eGFR: 71 mL/min/{1.73_m2} (ref >=60)

## 2024-02-09 LAB — PSA, TOTAL AND FREE
PSA, % Free: 18 % — ABNORMAL LOW (ref >25)
PSA, Free: 0.6 ng/mL
PSA, Total: 3.3 ng/mL (ref <=4.0)

## 2024-02-10 ENCOUNTER — Ambulatory Visit (INDEPENDENT_AMBULATORY_CARE_PROVIDER_SITE_OTHER): Payer: Medicare Other | Admitting: Sports Medicine

## 2024-02-10 ENCOUNTER — Encounter: Payer: Self-pay | Admitting: Sports Medicine

## 2024-02-10 VITALS — BP 108/60 | HR 64 | Temp 97.2°F | Resp 16 | Ht 69.0 in | Wt 190.4 lb

## 2024-02-10 DIAGNOSIS — R350 Frequency of micturition: Secondary | ICD-10-CM

## 2024-02-10 DIAGNOSIS — G4733 Obstructive sleep apnea (adult) (pediatric): Secondary | ICD-10-CM | POA: Diagnosis not present

## 2024-02-10 DIAGNOSIS — I25810 Atherosclerosis of coronary artery bypass graft(s) without angina pectoris: Secondary | ICD-10-CM | POA: Diagnosis not present

## 2024-02-10 DIAGNOSIS — N401 Enlarged prostate with lower urinary tract symptoms: Secondary | ICD-10-CM | POA: Diagnosis not present

## 2024-02-10 DIAGNOSIS — I480 Paroxysmal atrial fibrillation: Secondary | ICD-10-CM

## 2024-02-10 DIAGNOSIS — F411 Generalized anxiety disorder: Secondary | ICD-10-CM

## 2024-02-10 NOTE — Progress Notes (Signed)
 Careteam: Patient Care Team: Venita Sheffield, MD as PCP - General (Internal Medicine) Lanier Prude, MD as PCP - Electrophysiology (Cardiology) Jake Bathe, MD as PCP - Cardiology (Cardiology) Roslynn Amble, MD as Consulting Physician (Pulmonary Disease) Arminda Resides, MD as Consulting Physician (Dermatology) Kerrin Champagne, MD (Inactive) as Consulting Physician (Orthopedic Surgery) Bjorn Pippin, MD as Attending Physician (Urology) Avel Peace, MD as Consulting Physician (General Surgery) Loreli Slot, MD as Consulting Physician (Cardiothoracic Surgery) Drema Halon, MD (Inactive) as Consulting Physician (Otolaryngology) Mathews Robinsons, MD as Consulting Physician (Dermatology)  PLACE OF SERVICE:  [facility]  Advanced Directive information Does Patient Have a Medical Advance Directive?: Yes, Type of Advance Directive: Healthcare Power of [place];Living will;Out of facility DNR (pink MOST or yellow form), Does patient want to make changes to medical advance directive?: No - Patient declined  Allergies  Allergen Reactions   Heparin     Blood clots    Penicillin G Shortness Of Breath   Levaquin [Levofloxacin In D5w] Other (See Comments)    Patient felt jittery and nervous with insomnia on the medication.   Aspirin     Breathing complications, can tolerate baby asa   Codeine     Some is ok   Dust Mite Extract    Grass Pollen(K-O-R-T-Swt Vern)    Mixed Ragweed    Sucralfate     Could not sleep   Zetia [Ezetimibe]     jittery   Zocor [Simvastatin]     Liver enzymes increased   Plavix [Clopidogrel Bisulfate] Palpitations    Chief Complaint  Patient presents with   Medical Management of Chronic Issues    4 month follow up.    Immunizations    Discuss the need for Covid Booster, and Shingrix vaccine.      Discussed the use of AI scribe software for clinical note transcription with the patient, who gave verbal consent to  proceed.  History of Present Illness   Alexander [name] is an 88 year old male with chronic back problems and osteoarthritis who presents for follow-up on his chronic conditions.  He has chronic back problems and saw neurosurgery recently. He has had an epidural in the past, which increased his pain, and he is currently not pursuing further surgical interventions. As per neurosurgery note   ''Lumbar disc degeneration, lumbago, lumbar spinal stenosisI have discussed situation with the patient. I reviewed his MRI scan with him. I have told him he has degenerative changes and narrowing most prominent L4-5. His symptoms do not seem consistent with neurogenic claudication so I do not think he would benefit from a decompressive laminectomy. He has had physical therapy which has not helped.We discussed other treatment options such as ?living with it? versus injections. ''  He experiences significant osteoarthritis in his hips, particularly on one side, causing him to toss and turn 25 to 30 times a night. This discomfort has also affected his ability to use his CPAP machine for sleep apnea management. He tries to be active and walks 3-4 times a week and goes to gym.  He has a history of sleep apnea and uses a CPAP machine, but has been unable to use it due to back and hip pain. A mouth unit has been ordered as an alternative to manage his sleep apnea.  He has a history of atrial fibrillation. He experiences palpitations and has been unable to tolerate blood thinners. He takes baby aspirin three times a week  He has a history of anxiety and anxiety attacks and followed with psychiatry recently.         Review of Systems:  Review of Systems  Constitutional:  Negative for chills and fever.  HENT:  Negative for congestion and sore throat.   Eyes:  Negative for double vision.  Respiratory:  Negative for cough, sputum production and shortness of breath.   Cardiovascular:  Negative for chest pain,  palpitations and leg swelling.  Gastrointestinal:  Negative for abdominal pain, heartburn and nausea.  Genitourinary:  Negative for dysuria, frequency and hematuria.  Musculoskeletal:  Positive for back pain. Negative for falls and myalgias.  Neurological:  Negative for dizziness.   Negative unless indicated in HPI.   Past Medical History:  Diagnosis Date   Allergic rhinitis due to pollen    Anginal pain (HCC)    once in a while, none recent   Anxiety    Anxiety disorder    Asthma    exercise induced   Atrial fibrillation (HCC)    hx of   Basal cell carcinoma of skin of lip    Clotting disorder (HCC)    Coronary atherosclerosis of native coronary artery    with CABG 3/12  -MAZE procedure    Diastasis recti 05/07/2016   Difficulty urinating    Diverticulosis of colon (without mention of hemorrhage)    mild   Dysuria    Edema    Essential hypertension, benign    Heart attack (HCC) 2012   Helicobacter pylori (H. 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HCC)    3/12   History of anal fissures    History of cholelithiasis    Hyperlipidemia    Hypertrophy of prostate with urinary obstruction and other lower urinary tract symptoms (LUTS)    Impotence of organic origin    Internal hemorrhoids without mention of complication    Jaundice age 53   Long term (current) use of anticoagulants    Neutropenia (HCC) 10/07/2016   Nonspecific elevation of levels of transaminase or lactic acid dehydrogenase (LDH)    Other voice and resonance disorders    after surgery, took breathing tube out and damaged something   Pneumonia    Pulmonary embolism (HCC) 2012   s/p heart surgery   Pulmonary embolism (HCC)    3/12   Rheumatic fever age 81   Skin cancer    Hx of squamous cell x2   Sleep apnea    no CPAP   Unspecified constipation    Past Surgical History:  Procedure Laterality Date   CARDIAC CATHETERIZATION     CHOLECYSTECTOMY N/A 11/18/2013   Procedure: LAPAROSCOPIC  CHOLECYSTECTOMY With IOC;  Surgeon: Adolph Pollack, MD;  Location: [facility];  Service: General;  Laterality: N/A;   CORONARY ARTERY BYPASS GRAFT  2012   Median sternotomy, extracorporeal circulation, coronary   HERNIA REPAIR  2010   bilateral   HYDROCELE EXCISION     dr Annabell Howells    [facility] SURGERY     squamus x2; [facility] fo bridge of nose June 2017.   ROOT CANAL  12/2020   Pr patient he had complications    SCROTUM EXPLORATION  1990's   multiple   SPERMATOCELECTOMY  02/1999   Right, Dr.[name]   SUPERFICIAL SKIN CYSTECTOMY (l) HIP Right 1997   dr Logan Bores   VASECTOMY  04/1992   Social History:   reports that he quit smoking about 46 years ago. His smoking use included cigarettes. He started smoking  about 67 years ago. He has a 22 pack-year smoking history. He has never used smokeless tobacco. He reports that he does not currently use alcohol. He reports that he does not use drugs.  Family History  Problem Relation Age of Onset   Bone cancer Mother    Diabetes Mother    Colon cancer Neg Hx    Esophageal cancer Neg Hx    Rectal cancer Neg Hx    Stomach cancer Neg Hx    Lung disease Neg Hx     Medications: Patient's Medications  New Prescriptions   No medications on file  Previous Medications   ACETAMINOPHEN (TYLENOL) 325 MG TABLET    Take 162.5 mg by mouth at bedtime.   ALBUTEROL (PROVENTIL HFA;VENTOLIN HFA) 108 (90 BASE) MCG/ACT INHALER    Inhale 2 puffs into the lungs every 6 (six) hours as needed for wheezing.   ALPRAZOLAM (XANAX) 0.25 MG TABLET    Take 1 tablet (0.25 mg total) by mouth at bedtime as needed for anxiety.   ASPIRIN EC 81 MG TABLET    Take 1 tablet (81 mg total) by mouth every Monday, Wednesday, and Friday. Swallow whole.   DIAZEPAM (VALIUM) 5 MG TABLET    Take 1/2-1 tablet by mouth at bedtime   HYDROCORTISONE 2.5 % CREAM    Apply 1 Application topically as needed.   NITROGLYCERIN (NITROSTAT) 0.4 MG SL TABLET    Place 0.4 mg under the tongue every 5 (five) minutes as  needed.   ROSUVASTATIN (CRESTOR) 10 MG TABLET    Take 1 tablet (10 mg total) by mouth daily.  Modified Medications   No medications on file  Discontinued Medications   HYDROCORTISONE (ANUSOL-HC) 2.5 % RECTAL CREAM    APPLY PEA SIZED AMOUNT RECTALLY TO THE AFFECTED AREA TWICE DAILY FOR 7 DAYS    Physical Exam: Vitals:   02/10/24 1007  BP: 108/60  Pulse: 64  Resp: 16  Temp: (!) 97.2 F (36.2 C)  SpO2: 98%  Weight: 190 lb 6.4 oz (86.4 kg)  Height: 5\' 9"  (1.753 m)   Body mass index is 28.12 kg/m. BP Readings from Last 3 Encounters:  02/10/24 108/60  02/01/24 138/68  10/13/23 110/68   Wt Readings from Last 3 Encounters:  02/10/24 190 lb 6.4 oz (86.4 kg)  02/01/24 186 lb (84.4 kg)  10/13/23 185 lb 3.2 oz (84 kg)    Physical Exam Constitutional:      Appearance: Normal appearance.  HENT:     Head: Normocephalic and atraumatic.  Cardiovascular:     Rate and Rhythm: Normal rate and regular rhythm.     Pulses: Normal pulses.     Heart sounds: Normal heart sounds.  Pulmonary:     Effort: No respiratory distress.     Breath sounds: No stridor. No wheezing or rales.  Abdominal:     General: Bowel sounds are normal. There is no distension.     Palpations: Abdomen is soft.     Tenderness: There is no abdominal tenderness. There is no right CVA tenderness or guarding.  Musculoskeletal:        General: No swelling.  Neurological:     Mental Status: He is alert. Mental status is at baseline.     Sensory: No sensory deficit.     Motor: No weakness.     Labs reviewed: Basic Metabolic Panel: Recent Labs    02/11/23 1122 02/08/24 0936  NA 141 142  K 5.0 4.5  CL 102 103  CO2 32 30  GLUCOSE 100* 105*  BUN 14 16  CREATININE 0.98 1.03  CALCIUM 9.6 9.8   Liver Function Tests: Recent Labs    02/11/23 1122 02/08/24 0936  AST 25 24  ALT 20 18  BILITOT 1.6* 1.4*  PROT 7.0 7.0   No results for input(s): "LIPASE", "AMYLASE" in the last 8760 hours. No results for  input(s): "AMMONIA" in the last 8760 hours. CBC: Recent Labs    02/08/24 0936  WBC 6.3  NEUTROABS 3,982  HGB 15.9  HCT 47.1  MCV 93.8  PLT 238   Lipid Panel: Recent Labs    02/11/23 1122  CHOL 154  HDL 58  LDLCALC 82  TRIG 65  CHOLHDL 2.7   TSH: No results for input(s): "TSH" in the last 8760 hours. A1C: Lab Results  Component Value Date   HGBA1C 5.9 (H) 01/28/2021    Assessment and Plan 1. Coronary artery disease involving coronary bypass graft of native heart without angina pectoris (Primary) Denies chest pain  Cont with regular exercises Cont with aspirin, crestor  2. Paroxysmal atrial fibrillation (HCC)  Rate controlled Pt declined to be on [facility]  Cont with aspirin  3. Benign prostatic hyperplasia with urinary frequency  Denies hematuria  Follows with urology  4. OSA (obstructive sleep apnea) atient has been unable to use CPAP machine due to back pain and frequent position changes during sleep. [facility]   -[facility] and [facility] Follow up with sleep clinic  5. GAD (generalized anxiety disorder)  Follow up with psychiatry   Other orders - hydrocortisone 2.5 % cream; Apply 1 Application topically as needed.     Chronic low back pain Discussed recent consultation with neurosurgeon who advised against surgical intervention . Patient reported increased pain after previous epidural injection. cont with home exercises take tylenol 650 mg q6 prn for pain        Return in about 6 months (around 08/12/2024).:

## 2024-02-24 DIAGNOSIS — D224 Melanocytic nevi of scalp and neck: Secondary | ICD-10-CM | POA: Diagnosis not present

## 2024-02-24 DIAGNOSIS — L821 Other seborrheic keratosis: Secondary | ICD-10-CM | POA: Diagnosis not present

## 2024-02-24 DIAGNOSIS — L720 Epidermal cyst: Secondary | ICD-10-CM | POA: Diagnosis not present

## 2024-02-24 DIAGNOSIS — D485 Neoplasm of uncertain behavior of skin: Secondary | ICD-10-CM | POA: Diagnosis not present

## 2024-02-24 DIAGNOSIS — D225 Melanocytic nevi of trunk: Secondary | ICD-10-CM | POA: Diagnosis not present

## 2024-02-24 DIAGNOSIS — Z85828 Personal history of other malignant neoplasm of skin: Secondary | ICD-10-CM | POA: Diagnosis not present

## 2024-02-26 ENCOUNTER — Encounter: Payer: Medicare Other | Admitting: Nurse Practitioner

## 2024-02-26 NOTE — Progress Notes (Signed)
 This encounter was created in error - please disregard.

## 2024-04-22 ENCOUNTER — Ambulatory Visit: Admitting: Adult Health

## 2024-04-22 ENCOUNTER — Encounter: Payer: Self-pay | Admitting: Adult Health

## 2024-04-22 VITALS — BP 130/70 | HR 70 | Temp 98.2°F | Resp 20 | Ht 69.0 in | Wt 187.0 lb

## 2024-04-22 DIAGNOSIS — B9689 Other specified bacterial agents as the cause of diseases classified elsewhere: Secondary | ICD-10-CM | POA: Diagnosis not present

## 2024-04-22 DIAGNOSIS — J019 Acute sinusitis, unspecified: Secondary | ICD-10-CM | POA: Diagnosis not present

## 2024-04-22 MED ORDER — AZITHROMYCIN 250 MG PO TABS: ORAL_TABLET | ORAL | 0 refills | Status: DC

## 2024-04-22 NOTE — Progress Notes (Signed)
 [facility] clinic  Provider:  Inge Mangle DNP  Code Status:  Full Code  Goals of Care:     02/10/2024   10:12 AM  Advanced Directives  Does Patient Have a Medical Advance Directive? Yes  Type of Estate agent of [place];Living will;Out of facility DNR (pink MOST or yellow form)  Does patient want to make changes to medical advance directive? No - Patient declined  Copy of Healthcare Power of Attorney in Chart? No - copy requested     Chief Complaint  Patient presents with   nasal drip    Sinus issues    Discussed the use of AI scribe software for clinical note transcription with the patient, who gave verbal consent to proceed.  HPI: Patient is a 88 y.o. male seen today for an acute visit for sinus pain.  He has been experiencing clear postnasal drip since Apr 19, 2024, which began after cleaning a dusty area in his house. No fever or chills are present, but he feels sluggish and tired.  He has a history of a deviated septum from a boxing injury in 1983, which occasionally causes problems. He also has allergies to grass, ragweed, dust, and dust mites, which he suspects may be contributing to his current symptoms. He experiences swelling in his sinuses, watery eyes, and some sinus soreness that has been ongoing for years. He notes clear postnasal drip and some congestion, although significant pain is not present. These symptoms are unusual for him, as he typically does not experience postnasal drip.  He is sensitive to medications, particularly steroids, which can trigger atrial fibrillation. He is cautious about taking medications and prefers to use them only when necessary.  He maintains an active lifestyle, walking three miles four to five days a week, lifting light weights, and practicing martial arts and tai chi. He does not smoke and only occasionally drinks alcohol. 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     Past  Medical History:  Diagnosis Date   Allergic rhinitis due to pollen    Anginal pain (HCC)    once in a while, none recent   Anxiety    Anxiety disorder    Asthma    exercise induced   Atrial fibrillation (HCC)    hx of   Basal cell carcinoma of skin of lip    Clotting disorder (HCC)    Coronary atherosclerosis of native coronary artery    with CABG 3/12  -MAZE procedure    Diastasis recti 05/07/2016   Difficulty urinating    Diverticulosis of colon (without mention of hemorrhage)    mild   Dysuria    Edema    Essential hypertension, benign    Heart attack (HCC) 2012   Helicobacter pylori (H. 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HCC)    3/12   History of anal fissures    History of cholelithiasis    Hyperlipidemia    Hypertrophy of prostate with urinary obstruction and other lower urinary tract symptoms (LUTS)    Impotence of organic origin    Internal hemorrhoids without mention of complication    Jaundice age 17   Long term (current) use of anticoagulants    Neutropenia (HCC) 10/07/2016   Nonspecific elevation of levels of transaminase or lactic acid dehydrogenase (LDH)    Other voice and resonance disorders    after surgery, took breathing tube out and damaged something   Pneumonia  Pulmonary embolism (HCC) 2012   s/p heart surgery   Pulmonary embolism (HCC)    3/12   Rheumatic fever age 32   Skin cancer    Hx of squamous cell x2   Sleep apnea    no CPAP   Unspecified constipation     Past Surgical History:  Procedure Laterality Date   CARDIAC CATHETERIZATION     CHOLECYSTECTOMY N/A 11/18/2013   Procedure: LAPAROSCOPIC CHOLECYSTECTOMY With IOC;  Surgeon: Harlee Lichtenstein, MD;  Location: [facility];  Service: General;  Laterality: N/A;   CORONARY ARTERY BYPASS GRAFT  2012   Median sternotomy, extracorporeal circulation, coronary   HERNIA REPAIR  2010   bilateral   HYDROCELE EXCISION     dr Inga Manges    [facility] SURGERY     squamus x2; [facility] fo bridge of nose  June 2017.   ROOT CANAL  12/2020   Pr patient he had complications    SCROTUM EXPLORATION  1990's   multiple   SPERMATOCELECTOMY  02/1999   Right, Dr.[name]   SUPERFICIAL SKIN CYSTECTOMY (l) HIP Right 1997   dr Luster Salters   VASECTOMY  04/1992    Allergies  Allergen Reactions   Heparin     Blood clots    Penicillin G Shortness Of Breath   Levaquin  [Levofloxacin  In D5w] Other (See Comments)    Patient felt jittery and nervous with insomnia on the medication.   Aspirin      Breathing complications, can tolerate baby asa   Codeine     Some is ok   Dust Mite Extract    Grass Pollen(K-O-R-T-Swt Vern)    Mixed Ragweed    Sucralfate      Could not sleep   Zetia [Ezetimibe]     jittery   Zocor [Simvastatin]     Liver enzymes increased   Plavix [Clopidogrel Bisulfate] Palpitations    Outpatient Encounter Medications as of 04/22/2024  Medication Sig   acetaminophen  (TYLENOL ) 325 MG tablet Take 162.5 mg by mouth at bedtime.   albuterol  (PROVENTIL  HFA;VENTOLIN  HFA) 108 (90 BASE) MCG/ACT inhaler Inhale 2 puffs into the lungs every 6 (six) hours as needed for wheezing.   ALPRAZolam  (XANAX ) 0.25 MG tablet Take 1 tablet (0.25 mg total) by mouth at bedtime as needed for anxiety.   aspirin  EC 81 MG tablet Take 1 tablet (81 mg total) by mouth every Monday, Wednesday, and Friday. Swallow whole.   diazepam  (VALIUM ) 5 MG tablet Take 1/2-1 tablet by mouth at bedtime   hydrocortisone  2.5 % cream Apply 1 Application topically as needed.   nitroGLYCERIN (NITROSTAT) 0.4 MG SL tablet Place 0.4 mg under the tongue every 5 (five) minutes as needed.   rosuvastatin  (CRESTOR ) 10 MG tablet Take 1 tablet (10 mg total) by mouth daily.   No facility-administered encounter medications on file as of 04/22/2024.    Review of Systems:  Review of Systems  Constitutional:  Negative for activity change, appetite change and fever.  HENT:  Positive for postnasal drip and sinus pain. Negative for sore throat.   Eyes:  Negative.   Cardiovascular:  Negative for chest pain and leg swelling.  Gastrointestinal:  Negative for abdominal distention, diarrhea and vomiting.  Genitourinary:  Negative for dysuria, frequency and urgency.  Skin:  Negative for color change.  Neurological:  Negative for dizziness and headaches.  Psychiatric/Behavioral:  Negative for behavioral problems and sleep disturbance. The patient is not nervous/anxious.     Health Maintenance  Topic Date Due   Zoster Vaccines-  Shingrix (1 of 2) Never done   Medicare Annual Wellness (AWV)  02/20/2024   COVID-19 Vaccine (10 - Pfizer risk 2024-25 season) 03/15/2024   INFLUENZA VACCINE  07/08/2024   DTaP/Tdap/Td (4 - Td or Tdap) 09/30/2029   Pneumonia Vaccine 45+ Years old  Completed   HPV VACCINES  Aged Out   Meningococcal B Vaccine  Aged Out    Physical Exam: Vitals:   04/22/24 0829  BP: 130/70  Pulse: 70  Resp: 20  Temp: 98.2 F (36.8 C)  SpO2: 98%  Weight: 187 lb (84.8 kg)  Height: 5\' 9"  (1.753 m)   Body mass index is 27.62 kg/m. Physical Exam Constitutional:      Appearance: Normal appearance.  HENT:     Head: Normocephalic and atraumatic.     Nose:     Comments: Bilateral nasal membrane with slight erythema    Mouth/Throat:     Mouth: Mucous membranes are moist.  Eyes:     Conjunctiva/sclera: Conjunctivae normal.  Cardiovascular:     Rate and Rhythm: Normal rate and regular rhythm.     Pulses: Normal pulses.     Heart sounds: Normal heart sounds.  Pulmonary:     Effort: Pulmonary effort is normal.     Breath sounds: Normal breath sounds.  Abdominal:     General: Bowel sounds are normal.     Palpations: Abdomen is soft.  Musculoskeletal:        General: No swelling. Normal range of motion.     Cervical back: Normal range of motion.  Skin:    General: Skin is warm and dry.  Neurological:     General: No focal deficit present.     Mental Status: He is alert and oriented to person, place, and time.   Psychiatric:        Mood and Affect: Mood normal.        Behavior: Behavior normal.     Labs reviewed: Basic Metabolic Panel: Recent Labs    02/08/24 0936  NA 142  K 4.5  CL 103  CO2 30  GLUCOSE 105*  BUN 16  CREATININE 1.03  CALCIUM  9.8   Liver Function Tests: Recent Labs    02/08/24 0936  AST 24  ALT 18  BILITOT 1.4*  PROT 7.0   No results for input(s): "LIPASE", "AMYLASE" in the last 8760 hours. No results for input(s): "AMMONIA" in the last 8760 hours. CBC: Recent Labs    02/08/24 0936  WBC 6.3  NEUTROABS 3,982  HGB 15.9  HCT 47.1  MCV 93.8  PLT 238   Lipid Panel: No results for input(s): "CHOL", "HDL", "LDLCALC", "TRIG", "CHOLHDL", "LDLDIRECT" in the last 8760 hours. Lab Results  Component Value Date   HGBA1C 5.9 (H) 01/28/2021    Procedures since last visit: No results found.  Assessment/Plan  1. Acute bacterial sinusitis (Primary) -  Postnasal drip likely due to allergic rhinitis, clear discharge -  - Prescribed azithromycin  (Z-Pak) for use if symptoms worsen. - azithromycin  (ZITHROMAX ) 250 MG tablet; Take 2 tablets on day 1, then 1 tablet daily on days 2 through 5  Dispense: 6 tablet; Refill: 0      Labs/tests ordered:  None   Return if symptoms worsen or fail to improve.  Llesenia Fogal Medina-Vargas, NP

## 2024-04-25 ENCOUNTER — Telehealth: Payer: Self-pay

## 2024-04-25 ENCOUNTER — Other Ambulatory Visit: Payer: Self-pay | Admitting: Adult Health

## 2024-04-25 NOTE — Telephone Encounter (Signed)
 Please see patient request below.  Copied from CRM [id number]. Topic: Clinical - Medication Question >> Apr 25, 2024  8:32 AM Lenon Radar A wrote: Reason for CRM: Patient reached out to see if he can get Amoxicillin  500mg  instead of the azithromycin  (ZITHROMAX ) 250 MG tablet. Patient began taking the Amoxicillin  500mg  instead of the azithromycin  (ZITHROMAX ) 250 MG tablet Due to having past heart issues. Patient stated that he feels better taking the Amoxicillin  500mg  and would prefer to take it over the azithromycin  (ZITHROMAX ) 250 MG tablet. Patient does not have a new prescription of the Amoxicillin  500mg  he is taking some that he already had at home. Please send to pharmacy for patient or contact patient if any additional questions. Patient can be reached at [phone].

## 2024-04-25 NOTE — Telephone Encounter (Signed)
 Patient is allergic to penicillin per allergy list and Amoxicillin  is belongs to penicillin family.

## 2024-04-26 ENCOUNTER — Other Ambulatory Visit: Payer: Self-pay | Admitting: Adult Health

## 2024-04-26 DIAGNOSIS — B9689 Other specified bacterial agents as the cause of diseases classified elsewhere: Secondary | ICD-10-CM

## 2024-04-26 MED ORDER — AMOXICILLIN 875 MG PO TABS: 875.0000 mg | ORAL_TABLET | Freq: Two times a day (BID) | ORAL | 0 refills | Status: AC

## 2024-04-26 NOTE — Telephone Encounter (Signed)
 Patient aware.

## 2024-04-26 NOTE — Telephone Encounter (Signed)
 Sent eRx to pharmacy for amoxicillin . FYI.

## 2024-04-26 NOTE — Telephone Encounter (Signed)
 Spoke with patient, patient states that he has take amoxicillin  since allergy listed on chart and tolerated well. Patient states maybe penicillin allergy can be take off chart or some notation can be made that he can take amoxicillin .   Please advise

## 2024-04-29 ENCOUNTER — Ambulatory Visit (INDEPENDENT_AMBULATORY_CARE_PROVIDER_SITE_OTHER): Payer: Medicare Other | Admitting: Behavioral Health

## 2024-04-29 ENCOUNTER — Encounter: Payer: Self-pay | Admitting: Behavioral Health

## 2024-04-29 DIAGNOSIS — F411 Generalized anxiety disorder: Secondary | ICD-10-CM | POA: Diagnosis not present

## 2024-04-29 DIAGNOSIS — G4700 Insomnia, unspecified: Secondary | ICD-10-CM | POA: Diagnosis not present

## 2024-04-29 NOTE — Progress Notes (Signed)
 [facility]  Patient ID: Alexander [name],  MRN: [id number]  PCP: Tye Gall, MD  Date of Evaluation: 04/29/2024 Time spent:30 minutes  Chief Complaint:  Chief Complaint   Depression; Anxiety; Follow-up; Patient Education     HISTORY/CURRENT STATUS: HPI "Alexander [name]", 88 year old male presents to this office for follow up and medication management.  Collateral information should be considered reliable.  Patient is alert and oriented x 4. Continues to take his medication as prescribed with no problems.  Says that he has been on the medication for 25 to 30 years and never had a problem with abuse.  Says that the medication continues to help him with sleep, anxiety, and relaxation.  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  Still drives.  Says that his anxiety today is 3/10, and depression is 0/10.  He denies any history of mania, no psychosis, no auditory or visual hallucinations or delirium.  Denies SI or HI.  Patient has a history of cardiac bypass.  Says that he has strong family support.   Past psychiatric medication trials: Valium  Xanax    Individual Medical History/ Review of Systems: Changes? :No   Allergies: Heparin, Penicillin g, Levaquin  [levofloxacin  in d5w], Aspirin , Codeine, Dust mite extract, Grass pollen(k-o-r-t-swt vern), Mixed ragweed, Sucralfate , Zetia [ezetimibe], Zocor [simvastatin], and Plavix [clopidogrel bisulfate]  Current Medications:  Current Outpatient Medications:    acetaminophen  (TYLENOL ) 325 MG tablet, Take 162.5 mg by mouth at bedtime., Disp: , Rfl:    albuterol  (PROVENTIL  HFA;VENTOLIN  HFA) 108 (90 BASE) MCG/ACT inhaler, Inhale 2 puffs into the lungs every 6 (six) hours as needed for wheezing., Disp: 1 Inhaler, Rfl: 0   ALPRAZolam  (XANAX ) 0.25 MG tablet, Take 1 tablet (0.25 mg total) by mouth at bedtime as needed for anxiety., Disp: 30 tablet, Rfl: 3   amoxicillin  (AMOXIL ) 875 MG tablet, Take 1 tablet (875 mg total) by mouth 2  (two) times daily for 5 days., Disp: 10 tablet, Rfl: 0   aspirin  EC 81 MG tablet, Take 1 tablet (81 mg total) by mouth every Monday, Wednesday, and Friday. Swallow whole., Disp: 45 tablet, Rfl: 3   diazepam  (VALIUM ) 5 MG tablet, Take 1/2-1 tablet by mouth at bedtime, Disp: 30 tablet, Rfl: 3   hydrocortisone  2.5 % cream, Apply 1 Application topically as needed., Disp: , Rfl:    nitroGLYCERIN (NITROSTAT) 0.4 MG SL tablet, Place 0.4 mg under the tongue every 5 (five) minutes as needed., Disp: , Rfl:    rosuvastatin  (CRESTOR ) 10 MG tablet, Take 1 tablet (10 mg total) by mouth daily., Disp: 30 tablet, Rfl: 11 Medication Side Effects: none  Family Medical/ Social History: Changes? No  MENTAL HEALTH EXAM:  There were no vitals taken for this visit.There is no height or weight on file to calculate BMI.  General Appearance: Casual, Neat, and Well Groomed  Eye Contact:  Good  Speech:  Clear and Coherent  Volume:  Normal  Mood:  NA  Affect:  Appropriate  Thought Process:  Coherent  Orientation:  Full (Time, Place, and Person)  Thought Content: Logical   Suicidal Thoughts:  No  Homicidal Thoughts:  No  Memory:  WNL  Judgement:  Good  Insight:  Good  Psychomotor Activity:  Normal  Concentration:  Concentration: Good  Recall:  Good  Fund of Knowledge: Good  Language: Good  Assets:  Desire for Improvement  ADL's:  Intact  Cognition: WNL  Prognosis:  Good    DIAGNOSES:    ICD-10-CM   1. GAD (  generalized anxiety disorder)  F41.1     2. Anxiety state  F41.1     3. Insomnia, unspecified type  G47.00       Receiving Psychotherapy: No    RECOMMENDATIONS:   Greater than 50% of 30 min  face to face time with patient was spent on counseling and coordination of care.  We discussed his continued stability. He is requesting no changes to medications this visit.  The patient has agreed to regular follow-ups every 3-6 months as required.  Patient follows up with his PCP on regular basis. We  agreed to: To continue Xanax  0.25 mg at bedtime To continue Valium  2.5 mg at bedtime.  May take the other half if experiencing increased anxiety during the day. Will follow-up in 6 months to reassess Will report worsening symptoms or side effects.  Patient understands that this is fall risk medication.  Will notify this office if experiencing changes in cognition. Provided emergency contact information. Discussed potential benefits, risk, and side effects of benzodiazepines to include potential risk of tolerance and dependence, as well as possible drowsiness.  Advised patient not to drive if experiencing drowsiness and to take lowest possible effective dose to minimize risk of dependence and tolerance.  Reviewed PDMP   Lincoln Renshaw, NP

## 2024-05-24 DIAGNOSIS — L820 Inflamed seborrheic keratosis: Secondary | ICD-10-CM | POA: Diagnosis not present

## 2024-06-01 DIAGNOSIS — W230XXA Caught, crushed, jammed, or pinched between moving objects, initial encounter: Secondary | ICD-10-CM | POA: Diagnosis not present

## 2024-06-01 DIAGNOSIS — S61011A Laceration without foreign body of right thumb without damage to nail, initial encounter: Secondary | ICD-10-CM | POA: Diagnosis not present

## 2024-06-27 ENCOUNTER — Other Ambulatory Visit: Payer: Self-pay | Admitting: Behavioral Health

## 2024-06-27 DIAGNOSIS — F411 Generalized anxiety disorder: Secondary | ICD-10-CM

## 2024-06-27 DIAGNOSIS — G4700 Insomnia, unspecified: Secondary | ICD-10-CM

## 2024-08-16 ENCOUNTER — Encounter: Admitting: Sports Medicine

## 2024-08-16 NOTE — Patient Instructions (Signed)
 1) Our records indicate you are due for an annual wellness visit, stop at check out to schedule (video or in-person).    2) Please visit your local pharmacy to receive your Shingles and Covid vaccine, if you have not already received.

## 2024-08-17 NOTE — Progress Notes (Signed)
 This encounter was created in error - please disregard.

## 2024-09-30 ENCOUNTER — Other Ambulatory Visit: Payer: Self-pay | Admitting: Adult Health

## 2024-09-30 DIAGNOSIS — F411 Generalized anxiety disorder: Secondary | ICD-10-CM

## 2024-09-30 DIAGNOSIS — G4700 Insomnia, unspecified: Secondary | ICD-10-CM

## 2024-10-03 DIAGNOSIS — D225 Melanocytic nevi of trunk: Secondary | ICD-10-CM | POA: Diagnosis not present

## 2024-10-03 DIAGNOSIS — L821 Other seborrheic keratosis: Secondary | ICD-10-CM | POA: Diagnosis not present

## 2024-10-03 DIAGNOSIS — L720 Epidermal cyst: Secondary | ICD-10-CM | POA: Diagnosis not present

## 2024-10-03 DIAGNOSIS — L570 Actinic keratosis: Secondary | ICD-10-CM | POA: Diagnosis not present

## 2024-10-03 DIAGNOSIS — Z85828 Personal history of other malignant neoplasm of skin: Secondary | ICD-10-CM | POA: Diagnosis not present

## 2024-10-03 DIAGNOSIS — H61001 Unspecified perichondritis of right external ear: Secondary | ICD-10-CM | POA: Diagnosis not present

## 2024-10-03 DIAGNOSIS — L905 Scar conditions and fibrosis of skin: Secondary | ICD-10-CM | POA: Diagnosis not present

## 2024-10-13 ENCOUNTER — Ambulatory Visit (INDEPENDENT_AMBULATORY_CARE_PROVIDER_SITE_OTHER): Admitting: Family

## 2024-10-13 ENCOUNTER — Encounter: Payer: Self-pay | Admitting: Family

## 2024-10-13 VITALS — BP 128/86 | HR 65 | Temp 98.1°F | Ht 69.0 in | Wt 180.0 lb

## 2024-10-13 DIAGNOSIS — N401 Enlarged prostate with lower urinary tract symptoms: Secondary | ICD-10-CM | POA: Diagnosis not present

## 2024-10-13 DIAGNOSIS — G8929 Other chronic pain: Secondary | ICD-10-CM

## 2024-10-13 DIAGNOSIS — I480 Paroxysmal atrial fibrillation: Secondary | ICD-10-CM | POA: Diagnosis not present

## 2024-10-13 DIAGNOSIS — F411 Generalized anxiety disorder: Secondary | ICD-10-CM

## 2024-10-13 DIAGNOSIS — I10 Essential (primary) hypertension: Secondary | ICD-10-CM

## 2024-10-13 DIAGNOSIS — F429 Obsessive-compulsive disorder, unspecified: Secondary | ICD-10-CM

## 2024-10-13 DIAGNOSIS — I25810 Atherosclerosis of coronary artery bypass graft(s) without angina pectoris: Secondary | ICD-10-CM

## 2024-10-13 DIAGNOSIS — M25552 Pain in left hip: Secondary | ICD-10-CM

## 2024-10-13 DIAGNOSIS — M545 Low back pain, unspecified: Secondary | ICD-10-CM

## 2024-10-13 DIAGNOSIS — G4733 Obstructive sleep apnea (adult) (pediatric): Secondary | ICD-10-CM

## 2024-10-13 DIAGNOSIS — R350 Frequency of micturition: Secondary | ICD-10-CM

## 2024-10-13 MED ORDER — NITROGLYCERIN 0.4 MG SL SUBL: 0.4000 mg | SUBLINGUAL_TABLET | SUBLINGUAL | 1 refills | Status: AC | PRN

## 2024-10-13 NOTE — Progress Notes (Signed)
 Provider: Roxan Plough FNP-C   Jermery Caratachea, Roxan BROCKS, NP  Patient Care Team: Joquan Lotz, Roxan BROCKS, NP as PCP - General (Family Medicine) Cindie Ole DASEN, MD as PCP - Electrophysiology (Cardiology) Jeffrie Oneil BROCKS, MD as PCP - Cardiology (Cardiology) Noreen Tonnie BRAVO, MD as Consulting Physician (Pulmonary Disease) Joshua Sieving, MD as Consulting Physician (Dermatology) Lucilla Lynwood BRAVO, MD (Inactive) as Consulting Physician (Orthopedic Surgery) Watt Rush, MD as Attending Physician (Urology) Lily Boas, MD as Consulting Physician (General Surgery) Kerrin Elspeth BROCKS, MD as Consulting Physician (Cardiothoracic Surgery) Ethyl Lonni BRAVO, MD (Inactive) as Consulting Physician (Otolaryngology) Jadine Charleston, MD as Consulting Physician (Dermatology)  Extended Emergency Contact Information Primary Emergency Contact: Duckett,[name] Address: [address] [place], [place] [address] [place] Home Phone: [phone] Mobile Phone: [phone] Relation: Spouse  Code Status:  Full Code  Goals of care: Advanced Directive information    02/10/2024   10:12 AM  Advanced Directives  Does Patient Have a Medical Advance Directive? Yes  Type of Estate Agent of [place];Living will;Out of facility DNR (pink MOST or yellow form)  Does patient want to make changes to medical advance directive? No - Patient declined  Copy of Healthcare Power of Attorney in Chart? No - copy requested     Chief Complaint  Patient presents with   Transitions Of Care    Right shoulder pt stated he's been having pain in shoulder for about 20 years ago recently pain in shoulder has gotten worse.  Discuss Lipid panel.  Pt still thinking about vaccines     Discussed the use of AI scribe software for clinical note transcription with the patient, who gave verbal consent to proceed.  History of Present Illness   Alexander [name] is an 88 year old  male who presents for a routine visit and lab work.  He has a history of open heart surgery 14 years ago, including a partial to full maze procedure and a triple bypass. He experienced atrial fibrillation once in the past 14 years, with palpitations and an irregular heartbeat, which resolved without recurrence. He attributes the episode to stress and has not had further issues. No current chest pain or angina. He maintains an active lifestyle, walking three miles five days a week.  He has a history of fatty infiltration in the pancreas and liver, and a stable lung nodule for two years. He experiences hip and back pain, attributed to his history as a long-distance runner, with significant pain if he does not maintain his walking routine.  He reports a right shoulder impingement for 20 years, exacerbated by heavy lifting, managed with self-rehabilitation. He is considering using Voltaren  gel for relief. He has a history of an enlarged prostate with frequent urination and wants his prostate checked again.  He has obstructive sleep apnea but has not been using his CPAP machine due to a deviated septum. He is trying a new oral appliance to improve sleep. He has a history of anxiety, OCD, and depression, managed with exercise and faith. He takes alprazolam  and Valium  at night for anxiety and sleep.  He follows a Mediterranean diet and takes vitamins D3, B3, E, and B12. He avoids Tylenol  due to constipation and does not use albuterol  due to concerns about heart stimulation. He uses hydrocortisone  cream as needed for skin issues and nitroglycerin for angina, though he has not needed it recently. He is currently taking rosuvastatin  10 mg daily for  cholesterol management.   Past Medical History:  Diagnosis Date   Allergic rhinitis due to pollen    Anginal pain    once in a while, none recent   Anxiety    Anxiety disorder    Asthma    exercise induced   Atrial fibrillation (HCC)    hx of   Basal cell  carcinoma of skin of lip    Clotting disorder    Coronary atherosclerosis of native coronary artery    with CABG 3/12  -MAZE procedure    Diastasis recti 05/07/2016   Difficulty urinating    Diverticulosis of colon (without mention of hemorrhage)    mild   Dysuria    Edema    Essential hypertension, benign    Heart attack (HCC) 2012   Helicobacter pylori (H. 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   History of anal fissures    History of cholelithiasis    Hyperlipidemia    Hypertrophy of prostate with urinary obstruction and other lower urinary tract symptoms (LUTS)    Impotence of organic origin    Internal hemorrhoids without mention of complication    Jaundice age 59   Long term (current) use of anticoagulants    Neutropenia 10/07/2016   Nonspecific elevation of levels of transaminase or lactic acid dehydrogenase (LDH)    Other voice and resonance disorders    after surgery, took breathing tube out and damaged something   Pneumonia    Pulmonary embolism (HCC) 2012   s/p heart surgery   Pulmonary embolism (HCC)    3/12   Rheumatic fever age 27   Skin cancer    Hx of squamous cell x2   Sleep apnea    no CPAP   Unspecified constipation    Past Surgical History:  Procedure Laterality Date   CARDIAC CATHETERIZATION     CHOLECYSTECTOMY N/A 11/18/2013   Procedure: LAPAROSCOPIC CHOLECYSTECTOMY With IOC;  Surgeon: Krystal JINNY Russell, MD;  Location: [facility];  Service: General;  Laterality: N/A;   CORONARY ARTERY BYPASS GRAFT  2012   Median sternotomy, extracorporeal circulation, coronary   HERNIA REPAIR  2010   bilateral   HYDROCELE EXCISION     dr watt    [facility] SURGERY     squamus x2; [facility] fo bridge of nose June 2017.   ROOT CANAL  12/2020   Pr patient he had complications    SCROTUM EXPLORATION  1990's   multiple   SPERMATOCELECTOMY  02/1999   Right, Dr.[name]   SUPERFICIAL SKIN CYSTECTOMY (l) HIP Right 1997   dr janit   VASECTOMY  04/1992    Allergies   Allergen Reactions   Heparin     Blood clots    Penicillin G Shortness Of Breath    Per patient he can take Amoxicillin  04/26/2024   Levaquin  [Levofloxacin  In D5w] Other (See Comments)    Patient felt jittery and nervous with insomnia on the medication.   Aspirin      Breathing complications, can tolerate baby asa   Codeine     Some is ok   Dust Mite Extract    Grass Pollen(K-O-R-T-Swt Vern)    Mixed Ragweed    Sucralfate      Could not sleep   Zetia [Ezetimibe]     jittery   Zocor [Simvastatin]     Liver enzymes increased   Plavix [Clopidogrel Bisulfate] Palpitations    Allergies as of 10/13/2024       Reactions  Heparin    Blood clots    Penicillin G Shortness Of Breath   Per patient he can take Amoxicillin  04/26/2024   Levaquin  [levofloxacin  In D5w] Other (See Comments)   Patient felt jittery and nervous with insomnia on the medication.   Aspirin     Breathing complications, can tolerate baby asa   Codeine    Some is ok   Dust Mite Extract    Grass Pollen(k-o-r-t-swt Vern)    Mixed Ragweed    Sucralfate     Could not sleep   Zetia [ezetimibe]    jittery   Zocor [simvastatin]    Liver enzymes increased   Plavix [clopidogrel Bisulfate] Palpitations        Medication List        Accurate as of October 13, 2024  9:45 PM. If you have any questions, ask your nurse or doctor.          acetaminophen  325 MG tablet Commonly known as: TYLENOL  Take 162.5 mg by mouth at bedtime.   albuterol  108 (90 Base) MCG/ACT inhaler Commonly known as: VENTOLIN  HFA Inhale 2 puffs into the lungs every 6 (six) hours as needed for wheezing.   ALPRAZolam  0.25 MG tablet Commonly known as: XANAX  TAKE 1 TABLET(0.25 MG) BY MOUTH AT BEDTIME AS NEEDED FOR ANXIETY   aspirin  EC 81 MG tablet Take 1 tablet (81 mg total) by mouth every Monday, Wednesday, and Friday. Swallow whole.   diazepam  5 MG tablet Commonly known as: VALIUM  TAKE 1/2 TO 1 TABLET BY MOUTH AT BEDTIME    hydrocortisone  2.5 % cream Apply 1 Application topically as needed.   nitroGLYCERIN 0.4 MG SL tablet Commonly known as: NITROSTAT Place 1 tablet (0.4 mg total) under the tongue every 5 (five) minutes as needed.   rosuvastatin  10 MG tablet Commonly known as: CRESTOR  Take 1 tablet (10 mg total) by mouth daily.        Review of Systems  Constitutional:  Negative for appetite change, chills, fatigue, fever and unexpected weight change.  HENT:  Positive for hearing loss. Negative for congestion, ear discharge, ear pain, nosebleeds, postnasal drip, rhinorrhea, sinus pressure, sinus pain, sneezing, sore throat, tinnitus and trouble swallowing.   Eyes:  Negative for pain, discharge, redness, itching and visual disturbance.  Respiratory:  Negative for cough, chest tightness, shortness of breath and wheezing.   Cardiovascular:  Negative for chest pain, palpitations and leg swelling.  Gastrointestinal:  Negative for abdominal distention, abdominal pain, blood in stool, constipation, diarrhea, nausea and vomiting.  Endocrine: Negative for cold intolerance, heat intolerance, polydipsia, polyphagia and polyuria.  Genitourinary:  Negative for difficulty urinating, dysuria, flank pain, frequency and urgency.  Musculoskeletal:  Negative for arthralgias, back pain, gait problem, joint swelling, myalgias, neck pain and neck stiffness.  Skin:  Negative for color change, pallor, rash and wound.  Neurological:  Negative for dizziness, syncope, speech difficulty, weakness, light-headedness, numbness and headaches.  Hematological:  Does not bruise/bleed easily.  Psychiatric/Behavioral:  Negative for agitation, behavioral problems, confusion, hallucinations, self-injury, sleep disturbance and suicidal ideas. The patient is not nervous/anxious.     Immunization History  Administered Date(s) Administered   H1N1 10/08/2008   INFLUENZA, HIGH DOSE SEASONAL PF 09/26/2015   Influenza Split 10/13/2013,  10/02/2014   Influenza,inj,Quad PF,6+ Mos 10/07/2016, 08/30/2018, 09/06/2019, 10/22/2020   Influenza-Unspecified 10/16/2006, 08/08/2008, 10/08/2009, 11/15/2012, 10/02/2014, 09/08/2015, 10/02/2016, 09/07/2018, 10/15/2020, 03/07/2022   PFIZER Comirnaty [place] Top)Covid-19 Tri-Sucrose Vaccine 06/19/2021   PFIZER(Purple Top)SARS-COV-2 Vaccination 01/03/2020, 01/14/2020, 02/04/2020, 09/27/2020   Pfizer Covid-19 Vaccine Bivalent  Booster 94yrs & up 10/24/2021, 12/09/2022   Pfizer(Comirnaty )Fall Seasonal Vaccine 12 years and older 10/14/2022, 09/15/2023   Pneumococcal Conjugate-13 10/16/2010, 10/02/2014   Pneumococcal Polysaccharide-23 03/20/2006   Td 12/15/2007   Tdap 04/07/2008, 10/01/2019   Tetanus 12/09/1995   Pertinent  Health Maintenance Due  Topic Date Due   Influenza Vaccine  07/08/2024      02/10/2024   10:13 AM 02/10/2024   10:22 AM 02/10/2024   10:29 AM 02/26/2024    7:43 AM 10/13/2024   11:05 AM  Fall Risk  Falls in the past year? 0 0 0 0 0  Was there an injury with Fall? 0 1 1 0 0  Fall Risk Category Calculator 0 1 1 0 0  Patient at Risk for Falls Due to No Fall Risks   No Fall Risks No Fall Risks  Fall risk Follow up Falls evaluation completed;Education provided;Falls prevention discussed   Falls evaluation completed Falls evaluation completed   Functional Status Survey:    Vitals:   10/13/24 1100  BP: 128/86  Pulse: 65  Temp: 98.1 F (36.7 C)  SpO2: 95%  Weight: 180 lb (81.6 kg)  Height: 5' 9 (1.753 m)   Body mass index is 26.58 kg/m. Physical Exam GENERAL: Alert, cooperative, well developed, no acute distress. HEENT: Normocephalic, normal oropharynx, moist mucous membranes, ears normal bilaterally, nose normal, eyes normal, no sinus tenderness. CHEST: Clear to auscultation bilaterally, no wheezes, rhonchi, or crackles. CARDIOVASCULAR: Normal heart rate and rhythm, S1 and S2 normal without murmurs. ABDOMEN: Soft, non-tender, non-distended, without organomegaly, normal  bowel sounds. EXTREMITIES: No cyanosis, edema, or sensory deficits in legs. MUSCULOSKELETAL: Left hip pain on movement. NEUROLOGICAL: Cranial nerves grossly intact, moves all extremities without gross motor or sensory deficit.  SKIN: No rash,no lesion or erythema   PSYCHIATRY/BEHAVIORAL: Mood stable   Labs reviewed: Recent Labs    02/08/24 0936  NA 142  K 4.5  CL 103  CO2 30  GLUCOSE 105*  BUN 16  CREATININE 1.03  CALCIUM  9.8   Recent Labs    02/08/24 0936  AST 24  ALT 18  BILITOT 1.4*  PROT 7.0   Recent Labs    02/08/24 0936  WBC 6.3  NEUTROABS 3,982  HGB 15.9  HCT 47.1  MCV 93.8  PLT 238   Lab Results  Component Value Date   TSH 3.340 10/17/2014   Lab Results  Component Value Date   HGBA1C 5.9 (H) 01/28/2021   Lab Results  Component Value Date   CHOL 154 02/11/2023   HDL 58 02/11/2023   LDLCALC 82 02/11/2023   TRIG 65 02/11/2023   CHOLHDL 2.7 02/11/2023    Significant Diagnostic Results in last 30 days:  No results found.  Assessment/Plan  Adult Wellness Visit Routine wellness visit with emphasis on regular exercise and a Mediterranean diet for overall health maintenance. - Scheduled routine wellness visit - Ordered CBC, CMP, TSH, lipid panel, and PSA - Scheduled fasting blood work for Tuesday morning  Atrial fibrillation, status post coronary artery bypass graft and maze procedure Atrial fibrillation post coronary artery bypass graft and maze procedure, with no episodes in the past 14 years except for a brief episode 10 years ago. The patient did report palpitations, but it was not specified whether it was due to atrial fibrillation or something else. Regular exercise and stress management are beneficial. - Continue current management and lifestyle modifications  Benign prostatic hyperplasia with lower urinary tract symptoms and frequency of micturition Benign prostatic hyperplasia  with lower urinary tract symptoms, including frequent  urination. PSA levels to be checked as part of routine workup. - Ordered PSA as part of routine workup  Obstructive sleep apnea with deviated septum Obstructive sleep apnea with deviated septum. Not using CPAP due to concerns about infection from deviated septum. Considering alternative oral appliance for sleep apnea management. - Consider trial of oral appliance for sleep apnea management  Generalized anxiety disorder, obsessive-compulsive disorder, and depression Generalized anxiety disorder, obsessive-compulsive disorder, and depression. Exercise and alprazolam  at night help manage symptoms. The patient reported depressive symptoms in the past, and believes that exercise has helped alleviate them. - Continue alprazolam  as needed for anxiety and OCD - Encouraged regular exercise for anxiety and depression management  Low back pain and left hip pain Chronic low back and left hip pain, likely related to previous long-distance running. Pain managed with regular exercise and stretching. Declined epidural injection for pain management. - Encouraged regular exercise and stretching for pain management  Right shoulder pain, chronic Chronic right shoulder pain, likely due to impingement. Previous consideration of surgical intervention, but opted for self-rehabilitation. Voltaren  gel recommended for topical anti-inflammatory treatment. - Recommended Voltaren  gel for topical anti-inflammatory treatment  Fatty infiltration of liver and pancreas Fatty infiltration of liver and pancreas, common in individuals of his age. No acute symptoms reported. - Continue monitoring with routine health maintenance  Pulmonary nodule, stable Pulmonary nodule, stable for two years with no increase in size. No acute concerns. - Continue monitoring with routine health maintenance  Skin rash, unspecified Unspecified skin rash, possibly related to detergent or skin damage from clothing. No itching reported. - Monitor  rash and consider changing detergent or clothing materials   Family/ staff Communication: Reviewed plan of care with patient verbalized understanding   Labs/tests ordered:  - CBC with Differential/Platelet - CMP with eGFR([facility]) - TSH - Hgb A1C - Lipid panel - PSA   Next Appointment : Return in about 6 months (around 04/12/2025) for medical mangement of chronic issues., fasting labs in one week, Annual wellness visit soon.   Spent 30 minutes of Face to face and non-face to face with patient  >50% time spent counseling; reviewing medical record; tests; labs; documentation and developing future plan of care.   Roxan JAYSON Plough, NP

## 2024-10-18 ENCOUNTER — Other Ambulatory Visit

## 2024-10-18 ENCOUNTER — Encounter: Payer: Self-pay | Admitting: Family

## 2024-10-18 ENCOUNTER — Ambulatory Visit: Admitting: Family

## 2024-10-18 VITALS — BP 136/80 | HR 61 | Temp 97.6°F | Resp 19 | Ht 69.0 in | Wt 183.6 lb

## 2024-10-18 DIAGNOSIS — R350 Frequency of micturition: Secondary | ICD-10-CM

## 2024-10-18 DIAGNOSIS — Z0000 Encounter for general adult medical examination without abnormal findings: Secondary | ICD-10-CM | POA: Diagnosis not present

## 2024-10-18 DIAGNOSIS — I480 Paroxysmal atrial fibrillation: Secondary | ICD-10-CM

## 2024-10-18 DIAGNOSIS — F411 Generalized anxiety disorder: Secondary | ICD-10-CM

## 2024-10-18 DIAGNOSIS — I10 Essential (primary) hypertension: Secondary | ICD-10-CM

## 2024-10-18 NOTE — Progress Notes (Signed)
 Subjective:   Alexander [name] is a 88 y.o. male who presents for a Medicare Annual Wellness Visit.  Allergies (verified) Heparin, Penicillin g, Levaquin  [levofloxacin  in d5w], Aspirin , Codeine, Dust mite extract, Grass pollen(k-o-r-t-swt vern), Mixed ragweed, Sucralfate , Zetia [ezetimibe], Zocor [simvastatin], and Plavix [clopidogrel bisulfate]   History: Past Medical History:  Diagnosis Date   Allergic rhinitis due to pollen    Anginal pain    once in a while, none recent   Anxiety    Anxiety disorder    Asthma    exercise induced   Atrial fibrillation (HCC)    hx of   Basal cell carcinoma of skin of lip    Clotting disorder    Coronary atherosclerosis of native coronary artery    with CABG 3/12  -MAZE procedure    Diastasis recti 05/07/2016   Difficulty urinating    Diverticulosis of colon (without mention of hemorrhage)    mild   Dysuria    Edema    Essential hypertension, benign    Heart attack (HCC) 2012   Helicobacter pylori (H. 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   History of anal fissures    History of cholelithiasis    Hyperlipidemia    Hypertrophy of prostate with urinary obstruction and other lower urinary tract symptoms (LUTS)    Impotence of organic origin    Internal hemorrhoids without mention of complication    Jaundice age 61   Long term (current) use of anticoagulants    Neutropenia 10/07/2016   Nonspecific elevation of levels of transaminase or lactic acid dehydrogenase (LDH)    Other voice and resonance disorders    after surgery, took breathing tube out and damaged something   Pneumonia    Pulmonary embolism (HCC) 2012   s/p heart surgery   Pulmonary embolism (HCC)    3/12   Rheumatic fever age 84   Skin cancer    Hx of squamous cell x2   Sleep apnea    no CPAP   Unspecified constipation    Past Surgical History:  Procedure Laterality Date   CARDIAC CATHETERIZATION     CHOLECYSTECTOMY N/A 11/18/2013   Procedure:  LAPAROSCOPIC CHOLECYSTECTOMY With IOC;  Surgeon: Krystal JINNY Russell, MD;  Location: [facility];  Service: General;  Laterality: N/A;   CORONARY ARTERY BYPASS GRAFT  2012   Median sternotomy, extracorporeal circulation, coronary   HERNIA REPAIR  2010   bilateral   HYDROCELE EXCISION     dr watt    [facility] SURGERY     squamus x2; [facility] fo bridge of nose June 2017.   ROOT CANAL  12/2020   Pr patient he had complications    SCROTUM EXPLORATION  1990's   multiple   SPERMATOCELECTOMY  02/1999   Right, Dr.[name]   SUPERFICIAL SKIN CYSTECTOMY (l) HIP Right 1997   dr janit   VASECTOMY  04/1992   Family History  Problem Relation Age of Onset   Bone cancer Mother    Diabetes Mother    Colon cancer Neg Hx    Esophageal cancer Neg Hx    Rectal cancer Neg Hx    Stomach cancer Neg Hx    Lung disease Neg Hx    Social History   Occupational History   Occupation: Museum/gallery Conservator: [employer]  Tobacco Use   Smoking status: Former    Current packs/day: 0.00    Average packs/day: 1 pack/day for 22.0 years (22.0  ttl pk-yrs)    Types: Cigarettes    Start date: 08/22/1956    Quit date: 12/08/1977    Years since quitting: 46.8   Smokeless tobacco: Never   Tobacco comments:    significant second-hand smoke exposure at work  Psychologist, Educational Use   Vaping status: Never Used  Substance and Sexual Activity   Alcohol use: Not Currently    Comment: former heavy alcohol use, rare use   Drug use: No   Sexual activity: Yes    Partners: Female   Tobacco Counseling Counseling given: Not Answered Tobacco comments: significant second-hand smoke exposure at work  [employer] Insecurity: No Food Insecurity (10/18/2024)  Housing: Unknown (10/18/2024)  Transportation Needs: No Transportation Needs (10/18/2024)  Utilities: Not At Risk (10/18/2024)  Alcohol Screen: Low Risk  (01/27/2019)  Depression (PHQ2-9): Low Risk  (10/18/2024)  Financial Resource Strain: Low Risk  (11/20/2017)   Physical Activity: Unknown (11/26/2018)  Social Connections: Moderately Integrated (11/20/2017)  Stress: Stress Concern Present (11/20/2017)  Tobacco Use: Medium Risk (10/18/2024)   Depression Screen    10/18/2024   11:28 AM 10/13/2024   11:05 AM 02/26/2024    7:43 AM 02/10/2024   10:29 AM 08/12/2023   10:34 AM 02/20/2023    8:45 AM 02/19/2023   10:11 AM  PHQ 2/9 Scores  PHQ - 2 Score 0 0 0 0 0 0 0     Goals Addressed   None    Visit info / Clinical Intake: Medicare Wellness Visit Type:: Initial Annual Wellness Visit Persons participating in visit:: patient Medicare Wellness Visit Mode:: In-person (required for [facility]) Information given by:: patient Interpreter Needed?: No Pre-visit prep was completed: yes AWV questionnaire completed by patient prior to visit?: no Living arrangements:: lives with spouse/significant other Patient's Overall Health Status Rating: good Typical amount of pain: none Does pain affect daily life?: no Are you currently prescribed opioids?: no  Dietary Habits and Nutritional Risks How many meals a day?: 4 Eats fruit and vegetables daily?: yes Most meals are obtained by: having others provide food; preparing own meals In the last 2 weeks, have you had any of the following?: none Diabetic:: no  Fall Screening Falls in the past year?: 0 Number of falls in past year: 0 Was there an injury with Fall?: 0 Fall Risk Category Calculator: 0 Patient Fall Risk Level: Low Fall Risk  Fall Risk Patient at Risk for Falls Due to: No Fall Risks Fall risk Follow up: Falls evaluation completed  Cognitive Assessment Difficulty concentrating, remembering, or making decisions? : no Will 6CIT or Mini Cog be Completed: yes What year is it?: 0 points What month is it?: 0 points Give patient an address phrase to remember (5 components): [address] [facility] [place]  About what time is it?: 0 points Count backwards from 20 to 1: 0 points Say the months of the year  in reverse: 0 points Repeat the address phrase from earlier: 0 points 6 CIT Score: 0 points  Advance Directives (For Healthcare) Does Patient Have a Medical Advance Directive?: Yes Does patient want to make changes to medical advance directive?: No - Patient declined Type of Advance Directive: Healthcare Power of Bernard; Living will Copy of Healthcare Power of Attorney in Chart?: No - copy requested Copy of Living Will in Chart?: No - copy requested Out of facility DNR (pink MOST or yellow form) in Chart? (Ambulatory ONLY): No - copy requested        Objective:    Today's Vitals  10/18/24 1122  BP: 136/80  Pulse: 61  Resp: 19  Temp: 97.6 F (36.4 C)  SpO2: 97%  Weight: 183 lb 9.6 oz (83.3 kg)  Height: 5' 9 (1.753 m)   Body mass index is 27.11 kg/m.  Current Medications (verified) Outpatient Encounter Medications as of 10/18/2024  Medication Sig   acetaminophen  (TYLENOL ) 325 MG tablet Take 162.5 mg by mouth at bedtime.   albuterol  (PROVENTIL  HFA;VENTOLIN  HFA) 108 (90 BASE) MCG/ACT inhaler Inhale 2 puffs into the lungs every 6 (six) hours as needed for wheezing.   ALPRAZolam  (XANAX ) 0.25 MG tablet TAKE 1 TABLET(0.25 MG) BY MOUTH AT BEDTIME AS NEEDED FOR ANXIETY   aspirin  EC 81 MG tablet Take 1 tablet (81 mg total) by mouth every Monday, Wednesday, and Friday. Swallow whole.   diazepam  (VALIUM ) 5 MG tablet TAKE 1/2 TO 1 TABLET BY MOUTH AT BEDTIME   hydrocortisone  2.5 % cream Apply 1 Application topically as needed.   nitroGLYCERIN (NITROSTAT) 0.4 MG SL tablet Place 1 tablet (0.4 mg total) under the tongue every 5 (five) minutes as needed.   rosuvastatin  (CRESTOR ) 10 MG tablet Take 1 tablet (10 mg total) by mouth daily.   No facility-administered encounter medications on file as of 10/18/2024.   Hearing/Vision screen Hearing Screening - Comments:: Some hearing issues pt wear glasses. Vision Screening - Comments:: Eye exam April 2025 no issues ( [facility]) Immunizations and Health Maintenance Health Maintenance  Topic Date Due   Medicare Annual Wellness (AWV)  02/20/2024   Influenza Vaccine  11/21/2024 (Originally 07/08/2024)   COVID-19 Vaccine (10 - 2025-26 season) 12/06/2024 (Originally 08/08/2024)   Zoster Vaccines- Shingrix (1 of 2) 12/06/2024 (Originally 08/22/1955)   DTaP/Tdap/Td (4 - Td or Tdap) 09/30/2029   Pneumococcal Vaccine: 50+ Years  Completed   Meningococcal B Vaccine  Aged Out        Assessment/Plan:  This is a routine wellness examination for Alexander [name].  Patient Care Team: Dillon Mcreynolds, Roxan BROCKS, NP as PCP - General (Family Medicine) Cindie Ole DASEN, MD as PCP - Electrophysiology (Cardiology) Jeffrie Oneil BROCKS, MD as PCP - Cardiology (Cardiology) Noreen Tonnie BRAVO, MD as Consulting Physician (Pulmonary Disease) Joshua Sieving, MD as Consulting Physician (Dermatology) Lucilla Lynwood BRAVO, MD (Inactive) as Consulting Physician (Orthopedic Surgery) Watt Rush, MD as Attending Physician (Urology) Lily Boas, MD as Consulting Physician (General Surgery) Kerrin Elspeth BROCKS, MD as Consulting Physician (Cardiothoracic Surgery) Ethyl Lonni BRAVO, MD (Inactive) as Consulting Physician (Otolaryngology) Jadine Charleston, MD as Consulting Physician (Dermatology)  I have personally reviewed and noted the following in the patient's chart:   Medical and social history Use of alcohol, tobacco or illicit drugs  Current medications and supplements including opioid prescriptions. Functional ability and status Nutritional status Physical activity Advanced directives List of other physicians Hospitalizations, surgeries, and ER visits in previous 12 months Vitals Screenings to include cognitive, depression, and falls Referrals and appointments  No orders of the defined types were placed in this encounter.  In addition, I have reviewed and discussed with patient certain preventive protocols, quality metrics, and best practice  recommendations. A written personalized care plan for preventive services as well as general preventive health recommendations were provided to patient.   Roxan BROCKS Nohelia Valenza, NP   10/18/2024   No follow-ups on file.  After Visit Summary: (In Person-Printed) AVS printed and given to the patient  Nurse Notes: Plans to get Influenza this weekend.COVID-19 plans to get at [facility] and Shingles at the Pharmacy.

## 2024-10-20 LAB — CBC WITH DIFFERENTIAL/PLATELET
Absolute Lymphocytes: 1618 {cells}/uL (ref 850–3900)
Absolute Monocytes: 409 {cells}/uL (ref 200–950)
Basophils Absolute: 37 {cells}/uL (ref 0–200)
Basophils Relative: 0.6 %
Eosinophils Absolute: 62 {cells}/uL (ref 15–500)
Eosinophils Relative: 1 %
HCT: 47.4 % (ref 38.5–50.0)
Hemoglobin: 15.6 g/dL (ref 13.2–17.1)
MCH: 31.1 pg (ref 27.0–33.0)
MCHC: 32.9 g/dL (ref 32.0–36.0)
MCV: 94.6 fL (ref 80.0–100.0)
MPV: 9.3 fL (ref 7.5–12.5)
Monocytes Relative: 6.6 %
Neutro Abs: 4073 {cells}/uL (ref 1500–7800)
Neutrophils Relative %: 65.7 %
Platelets: 250 Thousand/uL (ref 140–400)
RBC: 5.01 Million/uL (ref 4.20–5.80)
RDW: 12.2 % (ref 11.0–15.0)
Total Lymphocyte: 26.1 %
WBC: 6.2 Thousand/uL (ref 3.8–10.8)

## 2024-10-20 LAB — PSA, TOTAL AND FREE
PSA, % Free: 20 % — ABNORMAL LOW (ref >25)
PSA, Free: 0.6 ng/mL
PSA, Total: 3 ng/mL (ref <=4.0)

## 2024-10-20 LAB — COMPLETE METABOLIC PANEL WITHOUT GFR
AG Ratio: 1.6 (calc) (ref 1.0–2.5)
ALT: 19 U/L (ref 9–46)
AST: 24 U/L (ref 10–35)
Albumin: 4.2 g/dL (ref 3.6–5.1)
Alkaline phosphatase (APISO): 39 U/L (ref 35–144)
BUN: 15 mg/dL (ref 7–25)
CO2: 30 mmol/L (ref 20–32)
Calcium: 9.5 mg/dL (ref 8.6–10.3)
Chloride: 102 mmol/L (ref 98–110)
Creat: 0.97 mg/dL (ref 0.70–1.22)
Globulin: 2.6 g/dL (ref 1.9–3.7)
Glucose, Bld: 107 mg/dL — ABNORMAL HIGH (ref 65–99)
Potassium: 4.8 mmol/L (ref 3.5–5.3)
Sodium: 142 mmol/L (ref 135–146)
Total Bilirubin: 1.6 mg/dL — ABNORMAL HIGH (ref 0.2–1.2)
Total Protein: 6.8 g/dL (ref 6.1–8.1)

## 2024-10-20 LAB — LIPID PANEL
Cholesterol: 138 mg/dL (ref <200)
HDL: 59 mg/dL (ref >=40)
LDL Cholesterol (Calc): 65 mg/dL
Non-HDL Cholesterol (Calc): 79 mg/dL (ref <130)
Total CHOL/HDL Ratio: 2.3 (calc) (ref <5.0)
Triglycerides: 62 mg/dL (ref <150)

## 2024-10-20 LAB — TSH: TSH: 2.52 m[IU]/L (ref 0.40–4.50)

## 2024-10-21 ENCOUNTER — Encounter: Payer: Self-pay | Admitting: Behavioral Health

## 2024-10-21 ENCOUNTER — Ambulatory Visit: Admitting: Behavioral Health

## 2024-10-21 DIAGNOSIS — F411 Generalized anxiety disorder: Secondary | ICD-10-CM

## 2024-10-21 DIAGNOSIS — G4700 Insomnia, unspecified: Secondary | ICD-10-CM | POA: Diagnosis not present

## 2024-10-21 MED ORDER — ALPRAZOLAM 0.25 MG PO TABS: 0.2500 mg | ORAL_TABLET | Freq: Every evening | ORAL | 5 refills | Status: AC | PRN

## 2024-10-21 MED ORDER — DIAZEPAM 5 MG PO TABS
2.5000 mg | ORAL_TABLET | Freq: Every day | ORAL | 5 refills | Status: AC
Start: 2024-10-21 — End: ?

## 2024-10-21 NOTE — Progress Notes (Signed)
 [facility]  Patient ID: OLAMIDE CARATTINI,  MRN: [id number]  PCP: Leonarda Roxan BROCKS, NP  Date of Evaluation: 10/21/2024 Time spent:20 minutes  Chief Complaint:  Chief Complaint   Anxiety; Insomnia; Follow-up; Patient Education; Medication Refill     HISTORY/CURRENT STATUS: HPI Tylor, 88 year old male presents to this office for follow up and medication management.  Collateral information should be considered reliable.  No psychosocial changes this visit.  Patient is alert and oriented x 4.  Speech is age appropriate. Continues to take his medication as prescribed with no problems.  Has periodic anxiety but overall is doing well managing. Exercising every day which helps. Says that the medication continues to help him with sleep, anxiety, and relaxation.  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  Still drives.  Says that his anxiety today is 3/10, and depression is 0/10.  He denies any history of mania, no psychosis, no auditory or visual hallucinations or delirium.  Denies SI or HI.  Patient has a history of cardiac bypass.  Says that he has strong family support.   Past psychiatric medication trials: Valium  Xanax      Individual Medical History/ Review of Systems: Changes? :No   Allergies: Heparin, Penicillin g, Levaquin  [levofloxacin  in d5w], Aspirin , Codeine, Dust mite extract, Grass pollen(k-o-r-t-swt vern), Mixed ragweed, Sucralfate , Zetia [ezetimibe], Zocor [simvastatin], and Plavix [clopidogrel bisulfate]  Current Medications:  Current Outpatient Medications:    acetaminophen  (TYLENOL ) 325 MG tablet, Take 162.5 mg by mouth at bedtime., Disp: , Rfl:    albuterol  (PROVENTIL  HFA;VENTOLIN  HFA) 108 (90 BASE) MCG/ACT inhaler, Inhale 2 puffs into the lungs every 6 (six) hours as needed for wheezing., Disp: 1 Inhaler, Rfl: 0   ALPRAZolam  (XANAX ) 0.25 MG tablet, Take 1 tablet (0.25 mg total) by mouth at bedtime as needed for anxiety., Disp: 30 tablet, Rfl: 5    aspirin  EC 81 MG tablet, Take 1 tablet (81 mg total) by mouth every Monday, Wednesday, and Friday. Swallow whole., Disp: 45 tablet, Rfl: 3   diazepam  (VALIUM ) 5 MG tablet, Take 0.5-1 tablets (2.5-5 mg total) by mouth at bedtime., Disp: 30 tablet, Rfl: 5   hydrocortisone  2.5 % cream, Apply 1 Application topically as needed., Disp: , Rfl:    nitroGLYCERIN (NITROSTAT) 0.4 MG SL tablet, Place 1 tablet (0.4 mg total) under the tongue every 5 (five) minutes as needed., Disp: 10 tablet, Rfl: 1   rosuvastatin  (CRESTOR ) 10 MG tablet, Take 1 tablet (10 mg total) by mouth daily., Disp: 30 tablet, Rfl: 11 Medication Side Effects: none  Family Medical/ Social History: Changes? No  MENTAL HEALTH EXAM:  There were no vitals taken for this visit.There is no height or weight on file to calculate BMI.  General Appearance: Casual, Neat, and Well Groomed  Eye Contact:  Good  Speech:  Clear and Coherent  Volume:  Normal  Mood:  Anxious  Affect:  Appropriate  Thought Process:  Coherent  Orientation:  Full (Time, Place, and Person)  Thought Content: Logical   Suicidal Thoughts:  No  Homicidal Thoughts:  No  Memory:  WNL  Judgement:  Good  Insight:  Good  Psychomotor Activity:  Normal  Concentration:  Concentration: Good  Recall:  Good  Fund of Knowledge: Good  Language: Good  Assets:  Desire for Improvement  ADL's:  Intact  Cognition: WNL  Prognosis:  Good    DIAGNOSES:    ICD-10-CM   1. GAD (generalized anxiety disorder)  F41.1 ALPRAZolam  (XANAX ) 0.25 MG tablet  diazepam  (VALIUM ) 5 MG tablet    2. Insomnia, unspecified type  G47.00 ALPRAZolam  (XANAX ) 0.25 MG tablet    diazepam  (VALIUM ) 5 MG tablet      Receiving Psychotherapy: No    RECOMMENDATIONS:   Greater than 50% of 30 min  face to face time with patient was spent on counseling and coordination of care.  We discussed his continued stability. He is requesting no changes to medications this visit.  The patient has agreed to  regular follow-ups every 3-6 months as required.  Patient follows up with his PCP on regular basis. We agreed to: To continue Xanax  0.25 mg at bedtime To continue Valium  2.5 mg at bedtime.  May take the other half if experiencing increased anxiety during the day. Will follow-up in 6 months to reassess Will report worsening symptoms or side effects.  Patient understands that this is fall risk medication.  Will notify this office if experiencing changes in cognition. Provided emergency contact information. Discussed potential benefits, risk, and side effects of benzodiazepines to include potential risk of tolerance and dependence, as well as possible drowsiness.  Advised patient not to drive if experiencing drowsiness and to take lowest possible effective dose to minimize risk of dependence and tolerance.  Reviewed PDMP     Redell DELENA Pizza, NP

## 2024-10-27 ENCOUNTER — Ambulatory Visit: Payer: Self-pay | Admitting: Family

## 2024-10-27 ENCOUNTER — Other Ambulatory Visit: Payer: Self-pay

## 2024-10-27 DIAGNOSIS — R7309 Other abnormal glucose: Secondary | ICD-10-CM

## 2024-10-31 ENCOUNTER — Other Ambulatory Visit

## 2024-10-31 DIAGNOSIS — R7309 Other abnormal glucose: Secondary | ICD-10-CM

## 2024-10-31 LAB — HEMOGLOBIN A1C
Hgb A1c MFr Bld: 6 % — ABNORMAL HIGH (ref <5.7)
Mean Plasma Glucose: 126 mg/dL
eAG (mmol/L): 7 mmol/L

## 2024-11-02 ENCOUNTER — Ambulatory Visit: Payer: Self-pay | Admitting: Family

## 2024-12-27 ENCOUNTER — Telehealth: Payer: Self-pay

## 2024-12-27 NOTE — Telephone Encounter (Signed)
 Patient labs printed and placed upfront ,patient is aware.

## 2024-12-27 NOTE — Telephone Encounter (Signed)
 Copied from CRM [id number]. Topic: Clinical - Lab/Test Results >> Dec 27, 2024  1:32 PM Alexander [name] wrote: Reason for CRM: PT Requesting A1C reading last at 6.0 need copy from 10/31/24 labs. He will be coming down to today to pick this up . Needs this for tomorrow for the [place]. Need call back [phone]

## 2025-04-12 ENCOUNTER — Ambulatory Visit: Admitting: Family

## 2025-04-21 ENCOUNTER — Ambulatory Visit: Admitting: Behavioral Health

## 2025-10-17 ENCOUNTER — Other Ambulatory Visit

## 2025-10-19 ENCOUNTER — Ambulatory Visit: Admitting: Family
